# Patient Record
Sex: Female | Born: 1945
Health system: Southern US, Community
[De-identification: ages and names within clinical notes are randomized; demographics above are authoritative.]

## PROBLEM LIST (undated history)

## (undated) DIAGNOSIS — I2510 Atherosclerotic heart disease of native coronary artery without angina pectoris: Secondary | ICD-10-CM

## (undated) DIAGNOSIS — I428 Other cardiomyopathies: Secondary | ICD-10-CM

## (undated) DIAGNOSIS — E119 Type 2 diabetes mellitus without complications: Secondary | ICD-10-CM

## (undated) DIAGNOSIS — D649 Anemia, unspecified: Secondary | ICD-10-CM

## (undated) DIAGNOSIS — F329 Major depressive disorder, single episode, unspecified: Secondary | ICD-10-CM

## (undated) DIAGNOSIS — F419 Anxiety disorder, unspecified: Secondary | ICD-10-CM

## (undated) DIAGNOSIS — I10 Essential (primary) hypertension: Secondary | ICD-10-CM

## (undated) DIAGNOSIS — I5022 Chronic systolic (congestive) heart failure: Secondary | ICD-10-CM

## (undated) DIAGNOSIS — F32A Depression, unspecified: Secondary | ICD-10-CM

## (undated) DIAGNOSIS — R7881 Bacteremia: Secondary | ICD-10-CM

## (undated) DIAGNOSIS — I739 Peripheral vascular disease, unspecified: Secondary | ICD-10-CM

## (undated) HISTORY — DX: Major depressive disorder, single episode, unspecified: F32.9

## (undated) HISTORY — PX: TOE AMPUTATION: SHX809

## (undated) HISTORY — PX: ABDOMINAL HYSTERECTOMY: SHX81

## (undated) HISTORY — DX: Atherosclerotic heart disease of native coronary artery without angina pectoris: I25.10

## (undated) HISTORY — DX: Bacteremia: R78.81

## (undated) HISTORY — DX: Essential (primary) hypertension: I10

## (undated) HISTORY — PX: CARDIAC DEFIBRILLATOR PLACEMENT: SHX171

## (undated) HISTORY — DX: Other cardiomyopathies: I42.8

## (undated) HISTORY — DX: Peripheral vascular disease, unspecified: I73.9

## (undated) HISTORY — DX: Anxiety disorder, unspecified: F41.9

## (undated) HISTORY — DX: Chronic systolic (congestive) heart failure: I50.22

## (undated) HISTORY — DX: Type 2 diabetes mellitus without complications: E11.9

## (undated) HISTORY — PX: BLADDER SURGERY: SHX569

## (undated) HISTORY — PX: EYE SURGERY: SHX253

## (undated) HISTORY — PX: BREAST LUMPECTOMY: SHX2

## (undated) HISTORY — DX: Depression, unspecified: F32.A

## (undated) HISTORY — PX: OTHER SURGICAL HISTORY: SHX169

---

## 2002-04-23 ENCOUNTER — Emergency Department (HOSPITAL_COMMUNITY): Admission: EM | Admit: 2002-04-23 | Discharge: 2002-04-23 | Payer: Self-pay | Admitting: Emergency Medicine

## 2003-07-25 ENCOUNTER — Encounter: Payer: Self-pay | Admitting: General Surgery

## 2003-07-25 ENCOUNTER — Ambulatory Visit (HOSPITAL_COMMUNITY): Admission: RE | Admit: 2003-07-25 | Discharge: 2003-07-25 | Payer: Self-pay | Admitting: General Surgery

## 2003-09-03 ENCOUNTER — Ambulatory Visit (HOSPITAL_COMMUNITY): Admission: RE | Admit: 2003-09-03 | Discharge: 2003-09-03 | Payer: Self-pay | Admitting: General Surgery

## 2004-12-08 ENCOUNTER — Emergency Department (HOSPITAL_COMMUNITY): Admission: EM | Admit: 2004-12-08 | Discharge: 2004-12-08 | Payer: Self-pay | Admitting: Emergency Medicine

## 2006-01-20 ENCOUNTER — Ambulatory Visit: Payer: Self-pay | Admitting: Orthopedic Surgery

## 2006-12-04 ENCOUNTER — Emergency Department (HOSPITAL_COMMUNITY): Admission: EM | Admit: 2006-12-04 | Discharge: 2006-12-05 | Payer: Self-pay | Admitting: Emergency Medicine

## 2009-11-08 ENCOUNTER — Emergency Department (HOSPITAL_COMMUNITY): Admission: EM | Admit: 2009-11-08 | Discharge: 2009-11-08 | Payer: Self-pay | Admitting: Emergency Medicine

## 2009-11-12 ENCOUNTER — Ambulatory Visit: Payer: Self-pay | Admitting: Cardiology

## 2009-11-12 ENCOUNTER — Ambulatory Visit (HOSPITAL_COMMUNITY): Admission: RE | Admit: 2009-11-12 | Discharge: 2009-11-12 | Payer: Self-pay | Admitting: Family Medicine

## 2009-11-12 ENCOUNTER — Encounter (INDEPENDENT_AMBULATORY_CARE_PROVIDER_SITE_OTHER): Payer: Self-pay | Admitting: Family Medicine

## 2009-11-15 ENCOUNTER — Encounter (INDEPENDENT_AMBULATORY_CARE_PROVIDER_SITE_OTHER): Payer: Self-pay | Admitting: *Deleted

## 2009-11-15 LAB — CONVERTED CEMR LAB
Cholesterol: 163 mg/dL
Creatinine, Ser: 0.78 mg/dL
HDL: 66 mg/dL
Potassium: 4.3 meq/L
Triglycerides: 34 mg/dL

## 2009-11-21 ENCOUNTER — Encounter (INDEPENDENT_AMBULATORY_CARE_PROVIDER_SITE_OTHER): Payer: Self-pay | Admitting: *Deleted

## 2009-11-21 LAB — CONVERTED CEMR LAB
Brain Natriuretic Peptide: 198.7
CO2: 28 meq/L
Glucose, Bld: 313 mg/dL
Potassium: 4.4 meq/L

## 2009-12-04 ENCOUNTER — Encounter (INDEPENDENT_AMBULATORY_CARE_PROVIDER_SITE_OTHER): Payer: Self-pay | Admitting: *Deleted

## 2009-12-05 ENCOUNTER — Encounter (INDEPENDENT_AMBULATORY_CARE_PROVIDER_SITE_OTHER): Payer: Self-pay | Admitting: *Deleted

## 2009-12-05 ENCOUNTER — Ambulatory Visit: Payer: Self-pay | Admitting: Cardiology

## 2009-12-05 ENCOUNTER — Encounter: Payer: Self-pay | Admitting: Cardiovascular Disease

## 2009-12-05 DIAGNOSIS — I10 Essential (primary) hypertension: Secondary | ICD-10-CM | POA: Insufficient documentation

## 2009-12-05 DIAGNOSIS — I447 Left bundle-branch block, unspecified: Secondary | ICD-10-CM | POA: Insufficient documentation

## 2009-12-05 LAB — CONVERTED CEMR LAB
CO2: 27 meq/L
HCT: 36.1 %
Hemoglobin: 11.8 g/dL
INR: 1.05
Sodium: 139 meq/L

## 2009-12-09 ENCOUNTER — Encounter: Payer: Self-pay | Admitting: Cardiology

## 2009-12-10 ENCOUNTER — Ambulatory Visit: Payer: Self-pay | Admitting: Cardiology

## 2009-12-10 ENCOUNTER — Inpatient Hospital Stay (HOSPITAL_BASED_OUTPATIENT_CLINIC_OR_DEPARTMENT_OTHER): Admission: RE | Admit: 2009-12-10 | Discharge: 2009-12-10 | Payer: Self-pay | Admitting: Cardiovascular Disease

## 2009-12-13 ENCOUNTER — Ambulatory Visit (HOSPITAL_COMMUNITY): Admission: RE | Admit: 2009-12-13 | Discharge: 2009-12-13 | Payer: Self-pay | Admitting: Urology

## 2009-12-25 ENCOUNTER — Encounter (INDEPENDENT_AMBULATORY_CARE_PROVIDER_SITE_OTHER): Payer: Self-pay | Admitting: *Deleted

## 2009-12-31 ENCOUNTER — Ambulatory Visit: Payer: Self-pay | Admitting: Cardiology

## 2009-12-31 DIAGNOSIS — I5022 Chronic systolic (congestive) heart failure: Secondary | ICD-10-CM | POA: Insufficient documentation

## 2009-12-31 DIAGNOSIS — E785 Hyperlipidemia, unspecified: Secondary | ICD-10-CM | POA: Insufficient documentation

## 2009-12-31 DIAGNOSIS — I2510 Atherosclerotic heart disease of native coronary artery without angina pectoris: Secondary | ICD-10-CM | POA: Insufficient documentation

## 2009-12-31 DIAGNOSIS — I428 Other cardiomyopathies: Secondary | ICD-10-CM | POA: Insufficient documentation

## 2010-01-01 ENCOUNTER — Telehealth (INDEPENDENT_AMBULATORY_CARE_PROVIDER_SITE_OTHER): Payer: Self-pay

## 2010-01-15 ENCOUNTER — Encounter (INDEPENDENT_AMBULATORY_CARE_PROVIDER_SITE_OTHER): Payer: Self-pay | Admitting: *Deleted

## 2010-01-17 ENCOUNTER — Encounter: Payer: Self-pay | Admitting: Cardiology

## 2010-01-20 ENCOUNTER — Encounter: Payer: Self-pay | Admitting: Cardiology

## 2010-01-20 LAB — CONVERTED CEMR LAB
ALT: 16 units/L (ref 0–35)
Alkaline Phosphatase: 70 units/L (ref 39–117)
Calcium: 8.5 mg/dL (ref 8.4–10.5)
Cholesterol: 185 mg/dL (ref 0–200)
Glucose, Bld: 231 mg/dL — ABNORMAL HIGH (ref 70–99)
HDL: 67 mg/dL (ref >39)
LDL Cholesterol: 108 mg/dL — ABNORMAL HIGH (ref 0–99)
Potassium: 3.7 meq/L (ref 3.5–5.3)
Sodium: 142 meq/L (ref 135–145)
Total Bilirubin: 0.3 mg/dL (ref 0.3–1.2)
Triglycerides: 51 mg/dL (ref <150)

## 2010-01-21 ENCOUNTER — Ambulatory Visit: Payer: Self-pay | Admitting: Cardiology

## 2010-01-22 ENCOUNTER — Ambulatory Visit (HOSPITAL_COMMUNITY): Admission: RE | Admit: 2010-01-22 | Discharge: 2010-01-22 | Payer: Self-pay | Admitting: Cardiology

## 2010-01-22 ENCOUNTER — Encounter: Payer: Self-pay | Admitting: Cardiology

## 2010-01-22 ENCOUNTER — Telehealth (INDEPENDENT_AMBULATORY_CARE_PROVIDER_SITE_OTHER): Payer: Self-pay | Admitting: *Deleted

## 2010-01-22 ENCOUNTER — Ambulatory Visit: Payer: Self-pay | Admitting: Cardiology

## 2010-02-10 ENCOUNTER — Encounter: Admission: RE | Admit: 2010-02-10 | Discharge: 2010-02-10 | Payer: Self-pay | Admitting: Otolaryngology

## 2010-03-18 ENCOUNTER — Ambulatory Visit: Payer: Self-pay | Admitting: Cardiology

## 2010-04-03 ENCOUNTER — Encounter (INDEPENDENT_AMBULATORY_CARE_PROVIDER_SITE_OTHER): Payer: Self-pay | Admitting: *Deleted

## 2010-04-03 LAB — CONVERTED CEMR LAB
Glucose, Bld: 105 mg/dL
Potassium: 4.3 meq/L
Sodium: 145 meq/L

## 2010-04-04 ENCOUNTER — Encounter: Payer: Self-pay | Admitting: Cardiology

## 2010-04-04 LAB — CONVERTED CEMR LAB
BUN: 24 mg/dL — ABNORMAL HIGH (ref 6–23)
Calcium: 9 mg/dL (ref 8.4–10.5)
Creatinine, Ser: 0.66 mg/dL (ref 0.40–1.20)
Sodium: 145 meq/L (ref 135–145)

## 2010-04-08 ENCOUNTER — Telehealth (INDEPENDENT_AMBULATORY_CARE_PROVIDER_SITE_OTHER): Payer: Self-pay | Admitting: *Deleted

## 2010-04-30 ENCOUNTER — Encounter (INDEPENDENT_AMBULATORY_CARE_PROVIDER_SITE_OTHER): Payer: Self-pay | Admitting: *Deleted

## 2010-05-01 ENCOUNTER — Ambulatory Visit: Payer: Self-pay | Admitting: Cardiology

## 2010-05-19 ENCOUNTER — Encounter (INDEPENDENT_AMBULATORY_CARE_PROVIDER_SITE_OTHER): Payer: Self-pay | Admitting: *Deleted

## 2010-05-19 LAB — CONVERTED CEMR LAB
Brain Natriuretic Peptide: 298.2
Calcium: 9.2 mg/dL
Glucose, Bld: 200 mg/dL

## 2010-05-20 ENCOUNTER — Encounter (INDEPENDENT_AMBULATORY_CARE_PROVIDER_SITE_OTHER): Payer: Self-pay | Admitting: *Deleted

## 2010-05-25 LAB — CONVERTED CEMR LAB
BUN: 28 mg/dL — ABNORMAL HIGH (ref 6–23)
CO2: 26 meq/L (ref 19–32)
Calcium: 9.2 mg/dL (ref 8.4–10.5)
Creatinine, Ser: 0.71 mg/dL (ref 0.40–1.20)
Glucose, Bld: 200 mg/dL — ABNORMAL HIGH (ref 70–99)
Sodium: 143 meq/L (ref 135–145)

## 2010-05-26 ENCOUNTER — Encounter: Payer: Self-pay | Admitting: Cardiology

## 2010-05-28 ENCOUNTER — Ambulatory Visit: Payer: Self-pay | Admitting: Cardiology

## 2010-06-12 ENCOUNTER — Telehealth (INDEPENDENT_AMBULATORY_CARE_PROVIDER_SITE_OTHER): Payer: Self-pay

## 2010-06-25 ENCOUNTER — Encounter: Payer: Self-pay | Admitting: Cardiology

## 2010-06-25 LAB — CONVERTED CEMR LAB
BUN: 27 mg/dL — ABNORMAL HIGH (ref 6–23)
CO2: 27 meq/L (ref 19–32)
Chloride: 107 meq/L (ref 96–112)
Glucose, Bld: 145 mg/dL — ABNORMAL HIGH (ref 70–99)
Potassium: 4.2 meq/L (ref 3.5–5.3)
Sodium: 141 meq/L (ref 135–145)

## 2010-07-01 ENCOUNTER — Ambulatory Visit: Payer: Self-pay | Admitting: Cardiology

## 2010-07-08 ENCOUNTER — Telehealth: Payer: Self-pay | Admitting: Internal Medicine

## 2010-07-28 ENCOUNTER — Ambulatory Visit (HOSPITAL_COMMUNITY): Admission: RE | Admit: 2010-07-28 | Discharge: 2010-07-28 | Payer: Self-pay | Admitting: Internal Medicine

## 2010-07-28 ENCOUNTER — Ambulatory Visit: Payer: Self-pay | Admitting: Internal Medicine

## 2010-08-12 ENCOUNTER — Ambulatory Visit: Payer: Self-pay | Admitting: Internal Medicine

## 2010-08-12 LAB — CONVERTED CEMR LAB
BUN: 24 mg/dL — ABNORMAL HIGH (ref 6–23)
Calcium: 9.4 mg/dL (ref 8.4–10.5)
Glucose, Bld: 137 mg/dL — ABNORMAL HIGH (ref 70–99)
INR: 1 (ref 0.8–1.0)
Lymphocytes Relative: 30.8 % (ref 12.0–46.0)
MCHC: 34 g/dL (ref 30.0–36.0)
Monocytes Absolute: 0.6 10*3/uL (ref 0.1–1.0)
Neutro Abs: 4.4 10*3/uL (ref 1.4–7.7)
Neutrophils Relative %: 59.7 % (ref 43.0–77.0)
Platelets: 191 10*3/uL (ref 150.0–400.0)
Potassium: 4.3 meq/L (ref 3.5–5.1)
RBC: 3.8 M/uL — ABNORMAL LOW (ref 3.87–5.11)
WBC: 7.3 10*3/uL (ref 4.5–10.5)

## 2010-08-14 ENCOUNTER — Ambulatory Visit: Payer: Self-pay | Admitting: Internal Medicine

## 2010-08-14 ENCOUNTER — Inpatient Hospital Stay (HOSPITAL_BASED_OUTPATIENT_CLINIC_OR_DEPARTMENT_OTHER): Admission: RE | Admit: 2010-08-14 | Discharge: 2010-08-14 | Payer: Self-pay | Admitting: Internal Medicine

## 2010-08-19 ENCOUNTER — Ambulatory Visit: Payer: Self-pay | Admitting: Cardiology

## 2010-08-19 ENCOUNTER — Ambulatory Visit (HOSPITAL_COMMUNITY): Admission: RE | Admit: 2010-08-19 | Discharge: 2010-08-19 | Payer: Self-pay | Admitting: Internal Medicine

## 2010-08-27 ENCOUNTER — Ambulatory Visit: Payer: Self-pay | Admitting: Internal Medicine

## 2010-09-01 ENCOUNTER — Ambulatory Visit: Payer: Self-pay | Admitting: Cardiology

## 2010-09-03 ENCOUNTER — Encounter: Payer: Self-pay | Admitting: Internal Medicine

## 2010-09-03 ENCOUNTER — Encounter (INDEPENDENT_AMBULATORY_CARE_PROVIDER_SITE_OTHER): Payer: Self-pay | Admitting: *Deleted

## 2010-09-18 ENCOUNTER — Telehealth: Payer: Self-pay | Admitting: Internal Medicine

## 2010-09-24 ENCOUNTER — Ambulatory Visit: Payer: Self-pay | Admitting: Internal Medicine

## 2010-09-24 LAB — CONVERTED CEMR LAB
BUN: 21 mg/dL (ref 6–23)
Basophils Absolute: 0 10*3/uL (ref 0.0–0.1)
Basophils Relative: 0.4 % (ref 0.0–3.0)
CO2: 29 meq/L (ref 19–32)
Chloride: 107 meq/L (ref 96–112)
Eosinophils Absolute: 0.1 10*3/uL (ref 0.0–0.7)
GFR calc non Af Amer: 127.01 mL/min (ref >60.00)
Glucose, Bld: 110 mg/dL — ABNORMAL HIGH (ref 70–99)
HCT: 33.3 % — ABNORMAL LOW (ref 36.0–46.0)
Hemoglobin: 11.5 g/dL — ABNORMAL LOW (ref 12.0–15.0)
INR: 1.1 — ABNORMAL HIGH (ref 0.8–1.0)
Lymphs Abs: 2.1 10*3/uL (ref 0.7–4.0)
MCHC: 34.4 g/dL (ref 30.0–36.0)
MCV: 91.4 fL (ref 78.0–100.0)
Monocytes Relative: 9 % (ref 3.0–12.0)
Neutro Abs: 3.3 10*3/uL (ref 1.4–7.7)
Neutrophils Relative %: 53.7 % (ref 43.0–77.0)
Platelets: 186 10*3/uL (ref 150.0–400.0)
Potassium: 4.3 meq/L (ref 3.5–5.1)
Prothrombin Time: 11.7 s (ref 9.7–11.8)
RDW: 14.6 % (ref 11.5–14.6)

## 2010-10-01 ENCOUNTER — Ambulatory Visit (HOSPITAL_COMMUNITY)
Admission: RE | Admit: 2010-10-01 | Discharge: 2010-10-02 | Payer: Self-pay | Source: Home / Self Care | Attending: Internal Medicine | Admitting: Internal Medicine

## 2010-10-02 ENCOUNTER — Encounter: Payer: Self-pay | Admitting: Internal Medicine

## 2010-10-17 ENCOUNTER — Encounter: Payer: Self-pay | Admitting: Internal Medicine

## 2010-10-17 ENCOUNTER — Ambulatory Visit
Admission: RE | Admit: 2010-10-17 | Discharge: 2010-10-17 | Payer: Self-pay | Source: Home / Self Care | Attending: Cardiology | Admitting: Cardiology

## 2010-11-02 LAB — CONVERTED CEMR LAB
Calcium: 9.4 mg/dL (ref 8.4–10.5)
Glucose, Bld: 109 mg/dL — ABNORMAL HIGH (ref 70–99)
INR: 1.05 (ref <1.50)
Lymphs Abs: 2.8 10*3/uL (ref 0.7–4.0)
MCHC: 32.7 g/dL (ref 30.0–36.0)
MCV: 88.7 fL (ref 78.0–100.0)
Monocytes Absolute: 0.7 10*3/uL (ref 0.1–1.0)
Neutro Abs: 3.7 10*3/uL (ref 1.7–7.7)
Neutrophils Relative %: 50 % (ref 43–77)
Platelets: 217 10*3/uL (ref 150–400)
Prothrombin Time: 13.6 s (ref 11.6–15.2)
RDW: 13.5 % (ref 11.5–15.5)
aPTT: 26 s (ref 24–37)

## 2010-11-04 NOTE — Miscellaneous (Signed)
Summary: LABS BMP,04/03/2010  Clinical Lists Changes  Observations: Added new observation of CALCIUM: 9.0 mg/dL (04/03/2010 16:15) Added new observation of CREATININE: 0.66 mg/dL (04/03/2010 16:15) Added new observation of BUN: 24 mg/dL (04/03/2010 16:15) Added new observation of BG RANDOM: 105 mg/dL (04/03/2010 16:15) Added new observation of CO2 PLSM/SER: 25 meq/L (04/03/2010 16:15) Added new observation of CL SERUM: 108 meq/L (04/03/2010 16:15) Added new observation of K SERUM: 4.3 meq/L (04/03/2010 16:15) Added new observation of NA: 145 meq/L (04/03/2010 16:15)

## 2010-11-04 NOTE — Assessment & Plan Note (Signed)
Summary: ROBV   Visit Type:  Follow-up Primary Provider:  Dr. Marjean Donna   History of Present Illness: 65 year old woman presents for followup. She is here with her daughter. She had a followup echocardiogram done back in April, detailed below, showing no significant improvement in LVEF as yet. She reports compliance with her medications.  From a symptom perspective, [name] states that she still has regular episodic dizziness (seems orthostatic in description), although no palpitations or syncope. She also reports occasional chest pains and NYHA class 2-3 dyspnea on exertion. Denies any falls. She has not returned to work, and  is undergoing disability determination based on notification received by this office.  She states that she feels unsteady on her feet, easily fatigued, and does not feel like she is able to return to work at this point.  I discussed with the patient and her daughter today, the rationale for ongoing medical therapy including continued titration of therapy. I also discussed the possibility of referral to our heart failure clinic, potentially even to consider more objective evaluation (CPX testing) if she remains symptomatic, despite appropriate medication adjustments over time.  If LVEF does not show any improvement, she may also be candidate for device therapy.  Current Medications (verified): 1)  Aspir-Low 81 Mg Tbec (Aspirin) .... Take 1 Tab Daily 2)  Losartan Potassium 50 Mg Tabs (Losartan Potassium) .... Take 1/2 (25mg )  Tablet By Mouth in The Am and 1(50mg ) Tablet At Bedtime 3)  Furosemide 20 Mg Tabs (Furosemide) .... Take 1 Tablet By Mouth Once Daily 4)  Klor-Con 10 10 Meq Cr-Tabs (Potassium Chloride) .... Take 1 Tablet By Mouth Once Daily 5)  Carvedilol 6.25 Mg Tabs (Carvedilol) .... Take 1 Tablet By Mouth Two Times A Day 6)  Nitrostat 0.4 Mg Subl (Nitroglycerin) .... Take As Directed For Chestpain 7)  Lantus 100 Unit/ml Soln (Insulin Glargine) .... 40  Units Daily 8)  Flexeril 10 Mg Tabs (Cyclobenzaprine Hcl) .... Take As Needed 9)  Digoxin 0.125 Mg Tabs (Digoxin) .... Take 1 Tablet By Mouth Once Daily  Allergies (verified): 1)  ! Vicodin  Past History:  Social History: Last updated: 03/18/2010 Full Time - [employer] [facility] (out of work) Tobacco Use - No Alcohol Use - no 2 children  Past Medical History: Diabetes Type 2 Hypertension Nonischemic cardiomyopathy, LVEF 20%  Social History: Full Time - [facility] (out of work) Tobacco Use - No Alcohol Use - no 2 children  Review of Systems       The patient complains of chest pain and dyspnea on exertion.  The patient denies anorexia, fever, weight gain, syncope, peripheral edema, prolonged cough, headaches, melena, hematochezia, and severe indigestion/heartburn.         Otherwise reviewed and negative.  Clinical Review Panels:  Cardiac Imaging Cardiac Cath Findings  CONCLUSION:   1. Nonobstructive coronary artery disease with 40% narrowing of the       proximal LAD, no significant obstruction of the circumflex and       right coronary artery.   2. Severe left ventricular dysfunction with global hypokinesis and an       estimated ejection fraction of 20-25% with pulmonary wedge pressure       of 15.      RECOMMENDATIONS:  The patient has severe dilated cardiomyopathy.  We   will plan to followup with Dr. [name] and continue medical management.               Bruce Alfonso Patten  Olevia Perches, MD, [facility]     (12/10/2009)    Vital Signs:  Patient profile:   65 year old female Weight:      134 pounds Pulse rate:   73 / minute Pulse (ortho):   71 / minute BP sitting:   138 / 70  (right arm) BP standing:   104 / 63  Vitals Entered By: Doretha Sou, CNA (March 18, 2010 9:31 AM)  Serial Vital Signs/Assessments:  Time      Position  BP       Pulse  Resp  Temp     By 9:53 AM   Lying LA  148/85   74                    Lynn Via LPN [id number] AM   Sitting   131/75   72                     Lynn Via LPN [id number] AM   Standing  104/63   71                    Lynn Via LPN  Comments: [id number] AM No s/s By: Jeani Hawking Via LPN    Physical Exam  Additional Exam:  Normally nourished appearing woman in no acute distress. HEENT: Conjunctiva and lids normal, oropharynx clear. Neck: Supple, JVP 8 cm water, no carotid bruits, no thyromegaly. Lungs: Clear with diminished breath sounds, nonlabored. Cardiac: Indistinct PMI, regular rate and rhythm, paradoxically split S2, no S3. Abdomen: Soft, nontender, no hepatomegaly, bowel sounds present. Extremities: Trace ankle edema, distal pulses one plus. Skin: Warm and dry. Musculoskeletal: No kyphosis. Neuropsychiatric: Alert and oriented x3, affect appropriate.    Echocardiogram  Procedure date:  01/22/2010  Findings:      Study Conclusions    - Left ventricle: The cavity size was mildly dilated. Wall thickness     was increased in a pattern of moderate LVH. Systolic function was     severely reduced. The estimated ejection fraction was in the range     of 15% to 20%. Doppler parameters are consistent with abnormal     left ventricular relaxation (grade 1 diastolic dysfunction).   - Ventricular septum: Septal motion showed abnormal function and     dyssynergy.   - Aortic valve: Mildly calcified annulus. Trileaflet; mildly     calcified leaflets. Trivial regurgitation. Peak gradient: 79mm Hg     (S).   - Mitral valve: Mild regurgitation.   - Left atrium: The atrium was mildly dilated.   - Right ventricle: Systolic function was normal.   - Tricuspid valve: Mild regurgitation.   - Pulmonary arteries: PA peak pressure: 53mm Hg (S).   - Pericardium, extracardiac: A small pericardial effusion was     identified.  Impression & Recommendations:  Problem # 1:  CHRONIC SYSTOLIC HEART FAILURE (123456)  Patient appears euvolemic, although continues to be symptomatic as noted above. She is somewhat orthostatic on examination  today, although not frankly hypotensive. We discussed a variety of options, and at this point we will attempt further medication adjustments, including decreasing Lasix to 20 mg a day, advancement in afterload reduction with an increase in Losartan to 25 mg p.o. q.a.m. and 50 mg p.o. q.p.m., and the addition of digoxin at 0.125 mg p.o. q.d. She will need a followup BMET and BNP in 2 weeks, I will see her back in 4 weeks.  Her updated medication list for  this problem includes:    Aspir-low 81 Mg Tbec (Aspirin) .Marland Kitchen... Take 1 tab daily    Losartan Potassium 50 Mg Tabs (Losartan potassium) .Marland Kitchen... Take 1/2 (25mg )  tablet by mouth in the am and 1(50mg ) tablet at bedtime    Furosemide 20 Mg Tabs (Furosemide) .Marland Kitchen... Take 1 tablet by mouth once daily    Carvedilol 6.25 Mg Tabs (Carvedilol) .Marland Kitchen... Take 1 tablet by mouth two times a day    Nitrostat 0.4 Mg Subl (Nitroglycerin) .Marland Kitchen... Take as directed for chestpain    Digoxin 0.125 Mg Tabs (Digoxin) .Marland Kitchen... Take 1 tablet by mouth once daily  Future Orders: T-Basic Metabolic Panel ([id number]) ... 04/01/2010  Problem # 2:  CORONARY ATHEROSCLEROSIS NATIVE CORONARY ARTERY (ICD-414.01)  Nonobstructive, documented at cardiac catheterization.  Her updated medication list for this problem includes:    Aspir-low 81 Mg Tbec (Aspirin) .Marland Kitchen... Take 1 tab daily    Carvedilol 6.25 Mg Tabs (Carvedilol) .Marland Kitchen... Take 1 tablet by mouth two times a day    Nitrostat 0.4 Mg Subl (Nitroglycerin) .Marland Kitchen... Take as directed for chestpain  Problem # 3:  LEFT BUNDLE BRANCH BLOCK (ICD-426.3)  Chronic. May ultimately be a candidate for resynchronization therapy if LVEF does not improve.  Her updated medication list for this problem includes:    Aspir-low 81 Mg Tbec (Aspirin) .Marland Kitchen... Take 1 tab daily    Carvedilol 6.25 Mg Tabs (Carvedilol) .Marland Kitchen... Take 1 tablet by mouth two times a day    Nitrostat 0.4 Mg Subl (Nitroglycerin) .Marland Kitchen... Take as directed for chestpain  Problem # 4:  UNSPECIFIED  SECONDARY CARDIOMYOPATHY (ICD-425.9)  Nonischemic cardiomyopathy.  Her updated medication list for this problem includes:    Aspir-low 81 Mg Tbec (Aspirin) .Marland Kitchen... Take 1 tab daily    Losartan Potassium 50 Mg Tabs (Losartan potassium) .Marland Kitchen... Take 1/2 (25mg )  tablet by mouth in the am and 1(50mg ) tablet at bedtime    Furosemide 20 Mg Tabs (Furosemide) .Marland Kitchen... Take 1 tablet by mouth once daily    Carvedilol 6.25 Mg Tabs (Carvedilol) .Marland Kitchen... Take 1 tablet by mouth two times a day    Nitrostat 0.4 Mg Subl (Nitroglycerin) .Marland Kitchen... Take as directed for chestpain    Digoxin 0.125 Mg Tabs (Digoxin) .Marland Kitchen... Take 1 tablet by mouth once daily  Patient Instructions: 1)  Your physician recommends that you schedule a follow-up appointment in: 4 weeks 2)  Your physician recommends that you return for lab work in: 2 weeks 3)  Your physician has recommended you make the following change in your medication: Start taking Digoxin 0.125mg  by mouth once daily, decrease Lasix to 20mg  by mouth once daily and increase Losartan to 1/2 tablet (25mg ) in the mornings and 1 tablet (50mg ) by mouth at bedtime  Prescriptions: LOSARTAN POTASSIUM 50 MG TABS (LOSARTAN POTASSIUM) TAKE 1/2 (25mg )  tablet by mouth in the AM and 1(50mg ) tablet at bedtime  #45 x 3   Entered by:   Jeani Hawking Via LPN   Authorized by:   Beckie Salts, MD, [facility]   Signed by:   Jeani Hawking Via LPN on [id number]   Method used:   Electronically to        [facility] (retail)       [address] [address]       [place], [place]  [address]       Ph: [phone]       Fax: [phone]   RxID:   [id number] DIGOXIN 0.125 MG  TABS (DIGOXIN) take 1 tablet by mouth once daily  #30 x 3   Entered by:   Jeani Hawking Via LPN   Authorized by:   Beckie Salts, MD, [facility]   Signed by:   Jeani Hawking Via LPN on [id number]   Method used:   Electronically to        [facility] (retail)       [place] [address]       [place], [place]  [address]       Ph: [phone]       Fax: [phone]   RxID:   [id number]

## 2010-11-04 NOTE — Assessment & Plan Note (Signed)
Summary: 1 mth f/u per checkout on 05/28/10/tg   Visit Type:  Follow-up Primary Provider:  Dr. Marjean Donna   History of Present Illness: 65 year old woman presents for followup. I saw her back in August. She is here with her daughter for followup. She continues to report fatigue, intermittent chest pain, and orthostasis. NYHA class 2-3 dyspnea exertion by report.  Followup labs from 20 September showed sodium 141, potassium 4.2, BUN 27, creatinine 0.7, BNP 106. Medical therapy is fairly well rounded out, reviewed below. We have been limited somewhat in further titration basilar blood pressure.  At the last visit I discussed with her the possibility of proceeding with a cardiopulmonary stress test for more objective assessment of her functional limitation, since she continued to complain of significant fatigue. I also again discussed with her potential for device therapy, specifically a defibrillator and perhaps resynchronization therapy. She has been hesitant to consider these options, although now is more amenable. Frankly she has concerns about her ability to work, and I think that further objective testing may be of use in planning additional therapies.  Clinical Review Panels:  Echocardiogram Echocardiogram Study Conclusions    - Left ventricle: The cavity size was mildly dilated. Wall thickness     was increased in a pattern of moderate LVH. Systolic function was     severely reduced. The estimated ejection fraction was in the range     of 15% to 20%. Doppler parameters are consistent with abnormal     left ventricular relaxation (grade 1 diastolic dysfunction).   - Ventricular septum: Septal motion showed abnormal function and     dyssynergy.   - Aortic valve: Mildly calcified annulus. Trileaflet; mildly     calcified leaflets. Trivial regurgitation. Peak gradient: 52mm Hg     (S).   - Mitral valve: Mild regurgitation.   - Left atrium: The atrium was mildly dilated.   - Right  ventricle: Systolic function was normal.   - Tricuspid valve: Mild regurgitation.   - Pulmonary arteries: PA peak pressure: 47mm Hg (S).   - Pericardium, extracardiac: A small pericardial effusion was     identified. (01/22/2010)  Cardiac Imaging Cardiac Cath Findings  CONCLUSION:   1. Nonobstructive coronary artery disease with 40% narrowing of the       proximal LAD, no significant obstruction of the circumflex and       right coronary artery.   2. Severe left ventricular dysfunction with global hypokinesis and an       estimated ejection fraction of 20-25% with pulmonary wedge pressure       of 15.      RECOMMENDATIONS:  The patient has severe dilated cardiomyopathy.  We   will plan to followup with Dr. [name] and continue medical management.               Bruce Alfonso Patten Olevia Perches, MD, [facility]     (12/10/2009)    Current Medications (verified): 1)  Aspir-Low 81 Mg Tbec (Aspirin) .... Take 1 Tab Daily 2)  Losartan Potassium 25 Mg Tabs (Losartan Potassium) .... Take 1 in Am and 2 in Pm 3)  Furosemide 20 Mg Tabs (Furosemide) .... Take 1 Tablet By Mouth Prn  For Swelling 4)  Klor-Con 10 10 Meq Cr-Tabs (Potassium Chloride) .... Take 1 Tablet By Mouth As Needed (Take When You Take Furosemide) 5)  Carvedilol 6.25 Mg Tabs (Carvedilol) .... Take 1 Tablet By Mouth Two Times A Day 6)  Nitrostat 0.4 Mg Subl (Nitroglycerin) .... Take  As Directed For Chestpain 7)  Lantus 100 Unit/ml Soln (Insulin Glargine) .... 40 Units Daily 8)  Digoxin 0.125 Mg Tabs (Digoxin) .... Take 1 Tablet By Mouth Once Daily 9)  Colace 100 Mg Caps (Docusate Sodium) .... Take 1 Tablet Daily As Needed For Constipation 10)  Aldactone 25 Mg Tabs (Spironolactone) .... Take 1 Tablet By Mouth By Mouth Once Daily 11)  Vitamin D3 1000000 Unit/gm Liqd (Cholecalciferol) .... Take 1 Tab Daily  Allergies (verified): 1)  ! Vicodin  Past History:  Past Medical History: Last updated: 05/01/2010 Diabetes Type  2 Hypertension Nonischemic cardiomyopathy, LVEF 20% CAD - nonobstructive  Past Surgical History: Last updated: January 01, 2010 Hysterectomy Left shoulder surgery Lumpectomy  Family History: Last updated: 2010/01/01 Father: died in his 75s with cirrhosis Mother: died age 69 with ovarian cancer  Social History: Last updated: 03/18/2010 Full Time - [employer] [facility] (out of work) Tobacco Use - No Alcohol Use - no 2 children  Review of Systems       The patient complains of chest pain and dyspnea on exertion.  The patient denies anorexia, weight gain, syncope, peripheral edema, prolonged cough, hemoptysis, melena, hematochezia, and severe indigestion/heartburn.         Otherwise reviewed and negative except as outlined.  Vital Signs:  Patient profile:   65 year old female Weight:      130 pounds BMI:     24.65 Pulse rate:   63 / minute BP sitting:   130 / 70  (right arm)  Vitals Entered By: Doretha Sou, CNA (July 01, 2010 8:24 AM)  Physical Exam  Additional Exam:  Normally nourished appearing woman in no acute distress. HEENT: Conjunctiva and lids normal, oropharynx clear. Neck: Supple, JVP 8 cm water, no carotid bruits, no thyromegaly. Lungs: Clear with diminished breath sounds, nonlabored. Cardiac: Indistinct PMI, regular rate and rhythm, paradoxically split S2, no S3. Abdomen: Soft, nontender, no hepatomegaly, bowel sounds present. Extremities: Trace ankle edema, distal pulses one plus. Skin: Warm and dry. Musculoskeletal: No kyphosis. Neuropsychiatric: Alert and oriented x3, affect appropriate.    EKG  Procedure date:  07/01/2010  Findings:      Sinus rhythm at 65 beats per minute with leftward axis and left bundle branch block.  Impression & Recommendations:  Problem # 1:  CHRONIC SYSTOLIC HEART FAILURE (123456)  Patient reports fatigue, NYHA class 2-3 dyspnea on exertion, intermittent chest pain. She is on a reasonable medical regimen, with  stable renal function, essentially normal BNP, and no weight gain. Difficult to get an objective sense of her functional capacity, but she clearly reports limitation. I have discussed this with her on the last 2 visits. At this point she seems more amenable to further evaluation by a congestive heart failure specialist. I anticipate referral for cardiopulmonary stress testing, and evaluation by Dr. Haroldine Laws. She might even need further consideration for resynchronization therapy depending on objective testing. No medication changes made today.  Her updated medication list for this problem includes:    Aspir-low 81 Mg Tbec (Aspirin) .Marland Kitchen... Take 1 tab daily    Losartan Potassium 25 Mg Tabs (Losartan potassium) .Marland Kitchen... Take 1 in am and 2 in pm    Furosemide 20 Mg Tabs (Furosemide) .Marland Kitchen... Take 1 tablet by mouth prn  for swelling    Carvedilol 6.25 Mg Tabs (Carvedilol) .Marland Kitchen... Take 1 tablet by mouth two times a day    Nitrostat 0.4 Mg Subl (Nitroglycerin) .Marland Kitchen... Take as directed for chestpain    Digoxin 0.125 Mg  Tabs (Digoxin) .Marland Kitchen... Take 1 tablet by mouth once daily    Aldactone 25 Mg Tabs (Spironolactone) .Marland Kitchen... Take 1 tablet by mouth by mouth once daily  Problem # 2:  CORONARY ATHEROSCLEROSIS NATIVE CORONARY ARTERY (ICD-414.01)  Mild atherosclerosis documented at cardiac catheterization earlier in the year, reviewed above.  Her updated medication list for this problem includes:    Aspir-low 81 Mg Tbec (Aspirin) .Marland Kitchen... Take 1 tab daily    Carvedilol 6.25 Mg Tabs (Carvedilol) .Marland Kitchen... Take 1 tablet by mouth two times a day    Nitrostat 0.4 Mg Subl (Nitroglycerin) .Marland Kitchen... Take as directed for chestpain  Problem # 3:  LEFT BUNDLE BRANCH BLOCK (ICD-426.3)  Old.  Her updated medication list for this problem includes:    Aspir-low 81 Mg Tbec (Aspirin) .Marland Kitchen... Take 1 tab daily    Carvedilol 6.25 Mg Tabs (Carvedilol) .Marland Kitchen... Take 1 tablet by mouth two times a day    Nitrostat 0.4 Mg Subl (Nitroglycerin) .Marland Kitchen... Take as  directed for chestpain  Problem # 4:  ESSENTIAL HYPERTENSION, BENIGN (ICD-401.1)  Blood pressure has been reasonably well controlled, at times has limited further up titration of therapy. She has had relative orthostasis on higher doses of medications.  Her updated medication list for this problem includes:    Aspir-low 81 Mg Tbec (Aspirin) .Marland Kitchen... Take 1 tab daily    Losartan Potassium 25 Mg Tabs (Losartan potassium) .Marland Kitchen... Take 1 in am and 2 in pm    Furosemide 20 Mg Tabs (Furosemide) .Marland Kitchen... Take 1 tablet by mouth prn  for swelling    Carvedilol 6.25 Mg Tabs (Carvedilol) .Marland Kitchen... Take 1 tablet by mouth two times a day    Aldactone 25 Mg Tabs (Spironolactone) .Marland Kitchen... Take 1 tablet by mouth by mouth once daily  Problem # 5:  UNSPECIFIED SECONDARY CARDIOMYOPATHY (ICD-425.9)  Nonischemic cardiomyopathy based on evaluation today.  Her updated medication list for this problem includes:    Aspir-low 81 Mg Tbec (Aspirin) .Marland Kitchen... Take 1 tab daily    Losartan Potassium 25 Mg Tabs (Losartan potassium) .Marland Kitchen... Take 1 in am and 2 in pm    Furosemide 20 Mg Tabs (Furosemide) .Marland Kitchen... Take 1 tablet by mouth prn  for swelling    Carvedilol 6.25 Mg Tabs (Carvedilol) .Marland Kitchen... Take 1 tablet by mouth two times a day    Nitrostat 0.4 Mg Subl (Nitroglycerin) .Marland Kitchen... Take as directed for chestpain    Digoxin 0.125 Mg Tabs (Digoxin) .Marland Kitchen... Take 1 tablet by mouth once daily    Aldactone 25 Mg Tabs (Spironolactone) .Marland Kitchen... Take 1 tablet by mouth by mouth once daily  Patient Instructions: 1)  Your physician recommends that you schedule a follow-up appointment in: 2 months 2)  Your physician recommends that you continue on your current medications as directed. Please refer to the Current Medication list given to you today. 3)  You have been referred to Dr. Haroldine Laws. This office will contact you once we have made arrangements for your visit with him.

## 2010-11-04 NOTE — Progress Notes (Signed)
Summary: cpx  Phone Note Outgoing Call   Call placed by: Kevan Rosebush, RN,  July 08, 2010 4:15 PM Call placed to: Patient Summary of Call: Dr [name] and Dr Haroldine Laws have discussed pt, she needs a CPX and f/u w/Dr Bensimhon, CPX is sch for 10/24 at 1:30 have called pt to let her know and to sch appt w/Dr Bensimhon, Left message to call back   Follow-up for Phone Call        pt called today and wanted an AM appt. advised CPX are done at 11:30 or 1:30, will resch for an 11:30 appt. Kevan Rosebush, RN  July 09, 2010 6:42 PM   CPX is resch for 10/24 at 11:30, have called pt w/no answer x 7 rings Kevan Rosebush, RN  July 21, 2010 4:17 PM   Additional Follow-up for Phone Call Additional follow up Details #1::        pt aware of cpx on 10/24 instructions reviewed w/her, appt sch w/Dr Bensimhon for 11/8 at [facility], RN  July 23, 2010 11:06 AM      Appended Document: cpx    Clinical Lists Changes  Orders: Added new Referral order of CPX Test at [facility] (CPX Test) - Signed

## 2010-11-04 NOTE — Assessment & Plan Note (Signed)
Summary: 3 wk f/u per checkout on 12/31/09/tg   Visit Type:  Follow-up Primary Provider:  Dr. Marjean Donna   History of Present Illness: 65 year old woman presents for follow-up. She complains of being fatigued, having shortness of breath sometimes NYHA class III, although no chest pain or palpitations. She does have occasional orthostatic dizziness, but no syncope.  She states that she is to undergo surgery for removal of a "bladder tumor" under general anesthesia at [facility] on Thursday.  Labs from 15 April revealed potassium 3.7, BUN 26, creatinine 0.85, AST 17, ALT 16, cholesterol 185, triglycerides 51, HDL 67, LDL 108.  I reviewed her medications, and she reports compliance.  Ms. Pettry had some questions about disability today. We talked about her cardiomyopathy, and the potential that she may show improvement in LVEF on medical therapy with time.  Current Medications (verified): 1)  Aspir-Low 81 Mg Tbec (Aspirin) .... Take 1 Tab Daily 2)  Losartan Potassium 50 Mg Tabs (Losartan Potassium) .... Take 1 Tablet By Mouth At Bedtime 3)  Furosemide 40 Mg Tabs (Furosemide) .... Take 1 Tablet By Mouth Once Daily 4)  Klor-Con 10 10 Meq Cr-Tabs (Potassium Chloride) .... Take 1 Tablet By Mouth Once Daily 5)  Carvedilol 6.25 Mg Tabs (Carvedilol) .... Take 1 Tablet By Mouth Two Times A Day 6)  Meclizine Hcl 12.5 Mg Tabs (Meclizine Hcl) .... Take 1 Tab Qid   Hold 7)  Nitrostat 0.4 Mg Subl (Nitroglycerin) .... Take As Directed For Chestpain 8)  Lantus 100 Unit/ml Soln (Insulin Glargine) .... 40 Units Daily  Allergies (verified): 1)  ! Vicodin  Past History:  Past Medical History: Last updated: 12/31/2009 Diabetes Type 2 Hypertension Nonischemic cardiomyopathy, LVEF 20-25%  Social History: Last updated: 12/05/2009 Full Time - [facility] Tobacco Use - No Alcohol Use - no 2 children  Clinical Review Panels:  Echocardiogram Echocardiogram   Study Conclusions    - Left  ventricle: The cavity size was mildly dilated. Wall thickness     was increased increased in a pattern of mild to moderate LVH.     Systolic function was severely reduced. The estimated ejection     fraction was in the range of 20% to 25%. Virtual akinesis of the     anteroseptal myocardium; moderate hypokinesis of the inferolateral     base. Other segments are severely hypokinetic. There was mild     spontaneous echo contrast, indicative of stasis.   - Ventricular septum: Septal motion showed mild paradox.   - Mitral valve: Mild regurgitation.   - Left atrium: The atrium was mildly dilated.   - Right ventricle: The cavity size was normal. Wall thickness was     mildly increased.   - Pulmonary arteries: Systolic pressure was mildly increased.   - Pericardium, extracardiac: A small, free-flowing circumferential     pericardial effusion was most prominent anterior to the heart. The     fluid had no internal echoes.   Transthoracic echocardiography. M-mode, complete 2D, spectral   Doppler, and color Doppler. Height: Height: 154.9cm. Height: 61in.   Weight: Weight: 65.9kg. Weight: 145lb. Body mass index: BMI:   27.5kg/m^2. Body surface area: BSA: 1.60m^2. Patient status:   Outpatient. Location: Echo laboratory.    -------------------------------------------------------------------- (11/12/2009)  Cardiac Imaging Cardiac Cath Findings  CONCLUSION:   1. Nonobstructive coronary artery disease with 40% narrowing of the       proximal LAD, no significant obstruction of the circumflex and  right coronary artery.   2. Severe left ventricular dysfunction with global hypokinesis and an       estimated ejection fraction of 20-25% with pulmonary wedge pressure       of 15.      RECOMMENDATIONS:  The patient has severe dilated cardiomyopathy.  We   will plan to followup with Dr. [name] and continue medical management.               Bruce Alfonso Patten Olevia Perches, MD, [facility]     (12/10/2009)    Review  of Systems       The patient complains of peripheral edema.  The patient denies anorexia, fever, weight gain, chest pain, syncope, abdominal pain, melena, hematochezia, and severe indigestion/heartburn.         Otherwise reviewed and negative except as outlined.  Vital Signs:  Patient profile:   65 year old female Weight:      134 pounds BMI:     25.41 Pulse rate:   68 / minute Pulse (ortho):   66 / minute BP sitting:   144 / 80  (right arm) BP standing:   105 / 66  Vitals Entered By: Doretha Sou, CNA (January 21, 2010 1:05 PM)  Serial Vital Signs/Assessments:  Time      Position  BP       Pulse  Resp  Temp     By 1:49 PM   Lying RA  129/68   [address] CNA 1:49 PM   Sitting   120/63   65                    [address], CNA 1:49 PM   Standing  105/66   [address] CNA   Physical Exam  Additional Exam:  Normally nourished appearing woman in no acute distress. HEENT: Conjunctiva and lids normal, oropharynx clear. Neck: Supple, JVP 8 cm water, no carotid bruits, no thyromegaly. Lungs: Clear with diminished breath sounds, nonlabored. Cardiac: Indistinct PMI, regular rate and rhythm, paradoxically split S2, no S3. Abdomen: Soft, nontender, no hepatomegaly, bowel sounds present. Extremities: Trace ankle edema, distal pulses one plus. Skin: Warm and dry. Musculoskeletal: No kyphosis. Neuropsychiatric: Alert and oriented x3, affect appropriate.    Impression & Recommendations:  Problem # 1:  CHRONIC SYSTOLIC HEART FAILURE (123456)  Patient appears euvolemic today. Her weight is stable. She did have some orthostatic change in blood pressure, although no frank hypotension. Heart rate was stable during this time. I have recommended that she take her losartan in the evening, otherwise continue her present regimen. A followup 2-D echocardiogram will be obtained prior to her visit in the next 6 weeks.  Her updated medication  list for this problem includes:    Aspir-low 81 Mg Tbec (Aspirin) .Marland Kitchen... Take 1 tab daily    Losartan Potassium 50 Mg Tabs (Losartan potassium) .Marland Kitchen... Take 1 tablet by mouth at bedtime    Furosemide 40 Mg Tabs (Furosemide) .Marland Kitchen... Take 1 tablet by mouth once daily    Carvedilol 6.25 Mg Tabs (Carvedilol) .Marland Kitchen... Take 1 tablet by mouth two times a day    Nitrostat 0.4 Mg Subl (Nitroglycerin) .Marland Kitchen... Take as directed for chestpain  Problem # 2:  ESSENTIAL HYPERTENSION, BENIGN (ICD-401.1)  No uptitration  of therapy undertaken at this point.  Her updated medication list for this problem includes:    Aspir-low 81 Mg Tbec (Aspirin) .Marland Kitchen... Take 1 tab daily    Losartan Potassium 50 Mg Tabs (Losartan potassium) .Marland Kitchen... Take 1 tablet by mouth at bedtime    Furosemide 40 Mg Tabs (Furosemide) .Marland Kitchen... Take 1 tablet by mouth once daily    Carvedilol 6.25 Mg Tabs (Carvedilol) .Marland Kitchen... Take 1 tablet by mouth two times a day  Problem # 3:  CORONARY ATHEROSCLEROSIS NATIVE CORONARY ARTERY (ICD-414.01)  Nonobstructive coronary atherosclerosis documented at recent cardiac catheterization.  Her updated medication list for this problem includes:    Aspir-low 81 Mg Tbec (Aspirin) .Marland Kitchen... Take 1 tab daily    Carvedilol 6.25 Mg Tabs (Carvedilol) .Marland Kitchen... Take 1 tablet by mouth two times a day    Nitrostat 0.4 Mg Subl (Nitroglycerin) .Marland Kitchen... Take as directed for chestpain  Other Orders: 2-D Echocardiogram (2D Echo)  Patient Instructions: 1)  Your physician recommends that you schedule a follow-up appointment in: 6weeks 2)  Your physician recommends that you continue on your current medications as directed. Please refer to the Current Medication list given to you today. 3)  ***Please take Losartan dose at bedtime*** 4)  Your physician has requested that you have an echocardiogram.  Echocardiography is a painless test that uses sound waves to create images of your heart. It provides your doctor with information about the size and shape  of your heart and how well your heart's chambers and valves are working.  This procedure takes approximately one hour. There are no restrictions for this procedure.

## 2010-11-04 NOTE — Assessment & Plan Note (Signed)
Summary: post cath per [facility] cath lab/sn   Visit Type:  Follow-up Primary Provider:  Dr. Marjean Donna   History of Present Illness: [name] presents for a followup visit. She was referred recently for a diagnostic cardiac catheterization in light of newly diagnosed cardiomyopathy, with LVEF of 20-25%. Fortunately she had nonobstructive coronary artery disease, and is to be managed medically at this point with an apparent nonischemic cardiomyopathy. Pulmonary capillary wedge pressure was 15 mmHg at catheterization.  From a symptom perspective she is reporting NYHA class 2-3 dyspnea and exertion, improvement in lower extremity edema, and some feeling of orthostatic dizziness. Orthostatic measurements were made today and reported below. She continues on twice daily Lasix.  We discussed the results and implications of her testing, plan for further optimization of medical therapy, and ultimately a repeat echocardiogram over time to reassess left ventricle systolic function. She states that she has completed some paperwork for her job regarding general timing of her return to work, although we have not received any of these for review.  She also tells me that she has a pending evaluation [facility] for resection of a bladder tumor. I have no other details about this as yet.  Current Medications (verified): 1)  Aspir-Low 81 Mg Tbec (Aspirin) .... Take 1 Tab Daily 2)  Losartan Potassium 50 Mg Tabs (Losartan Potassium) .... Take 1 Tab Daily 3)  Furosemide 40 Mg Tabs (Furosemide) .... Take 1 Tablet By Mouth Once Daily 4)  Klor-Con 10 10 Meq Cr-Tabs (Potassium Chloride) .... Take 1 Tablet By Mouth Once Daily 5)  Carvedilol 6.25 Mg Tabs (Carvedilol) .... Take 1 Tablet By Mouth Two Times A Day 6)  Meclizine Hcl 12.5 Mg Tabs (Meclizine Hcl) .... Take 1 Tab Qid   Hold 7)  Nitrostat 0.4 Mg Subl (Nitroglycerin) .... Take As Directed For Chestpain 8)  Lantus 100 Unit/ml Soln (Insulin Glargine) .... 40  Units Daily  Allergies (verified): 1)  ! Vicodin  Past History:  Social History: Last updated: 12/05/2009 Full Time - [facility] Tobacco Use - No Alcohol Use - no 2 children  Past Medical History: Diabetes Type 2 Hypertension Nonischemic cardiomyopathy, LVEF 20-25%  Clinical Review Panels:  Echocardiogram Echocardiogram   Study Conclusions    - Left ventricle: The cavity size was mildly dilated. Wall thickness     was increased increased in a pattern of mild to moderate LVH.     Systolic function was severely reduced. The estimated ejection     fraction was in the range of 20% to 25%. Virtual akinesis of the     anteroseptal myocardium; moderate hypokinesis of the inferolateral     base. Other segments are severely hypokinetic. There was mild     spontaneous echo contrast, indicative of stasis.   - Ventricular septum: Septal motion showed mild paradox.   - Mitral valve: Mild regurgitation.   - Left atrium: The atrium was mildly dilated.   - Right ventricle: The cavity size was normal. Wall thickness was     mildly increased.   - Pulmonary arteries: Systolic pressure was mildly increased.   - Pericardium, extracardiac: A small, free-flowing circumferential     pericardial effusion was most prominent anterior to the heart. The     fluid had no internal echoes.   Transthoracic echocardiography. M-mode, complete 2D, spectral   Doppler, and color Doppler. Height: Height: 154.9cm. Height: 61in.   Weight: Weight: 65.9kg. Weight: 145lb. Body mass index: BMI:   27.5kg/m^2. Body surface area: BSA:  1.36m^2. Patient status:   Outpatient. Location: Echo laboratory.    -------------------------------------------------------------------- (11/12/2009)  Cardiac Imaging Cardiac Cath Findings  CONCLUSION:   1. Nonobstructive coronary artery disease with 40% narrowing of the       proximal LAD, no significant obstruction of the circumflex and       right coronary artery.   2.  Severe left ventricular dysfunction with global hypokinesis and an       estimated ejection fraction of 20-25% with pulmonary wedge pressure       of 15.      RECOMMENDATIONS:  The patient has severe dilated cardiomyopathy.  We   will plan to followup with Dr. [name] and continue medical management.               Bruce Alfonso Patten Olevia Perches, MD, [facility]     (12/10/2009)    Review of Systems  The patient denies anorexia, fever, weight gain, chest pain, syncope, prolonged cough, melena, and hematochezia.         Otherwise reviewed and negative except as outlined above.  Vital Signs:  Patient profile:   Sharon year old female Height:      61 inches Weight:      134 pounds O2 Sat:      99 % on Room air Pulse rate:   75 / minute Pulse (ortho):   71 / minute BP sitting:   133 / 82  (left arm) BP standing:   104 / 67  Vitals Entered By: Tye Savoy RN (December 31, 2009 1:09 PM)  O2 Flow:  Room air  Serial Vital Signs/Assessments:  Time      Position  BP       Pulse  Resp  Temp     By 1:24 PM   Lying RA  140/79   72                    Tammy Sanders RN 1:24 PM   Sitting   124/75   72                    Tammy Sanders RN 1:24 PM   Standing  104/67   71                    Tammy Sanders RN  Comments: 1:24 PM lying-no signs sitting-no sx standing- no signs By: Tye Savoy RN    Physical Exam  Additional Exam:  Normally nourished appearing [name] in no acute distress. HEENT: Conjunctiva and lids normal, oropharynx clear. Neck: Supple, JVP 8 cm water, no carotid bruits, no thyromegaly. Lungs: Clear with diminished breath sounds, nonlabored. Cardiac: Indistinct PMI, regular rate and rhythm, paradoxically split S2, no S3. Abdomen: Soft, nontender, no hepatomegaly, bowel sounds present. Extremities: Trace ankle edema, distal pulses one plus. Skin: Warm and dry. Musculoskeletal: No kyphosis. Neuropsychiatric: Alert and oriented x3, affect appropriate.    EKG  Procedure date:   12/31/2009  Findings:      Normal sinus rhythm with left bundle branch block, 76 beats per minute.  Impression & Recommendations:  Problem # 1:  UNSPECIFIED SECONDARY CARDIOMYOPATHY (ICD-425.9)  Nonischemic cardiomyopathy as outlined above status post cardiac catheterization demonstrating only mild, nonobstructive coronary atherosclerosis. Medical therapy is planned at this time. Coreg will be increased to 6.25 mg p.o. b.i.d., and we will concurrently reduce Lasix to once daily dosing with a reduction of potassium as well. I will have her followup over the next 3  weeks for further clinical assessment and medication titration as tolerated. A CMET will be obtained prior to her next visit.  Her updated medication list for this problem includes:    Aspir-low 81 Mg Tbec (Aspirin) .Marland Kitchen... Take 1 tab daily    Losartan Potassium 50 Mg Tabs (Losartan potassium) .Marland Kitchen... Take 1 tab daily    Furosemide 40 Mg Tabs (Furosemide) .Marland Kitchen... Take 1 tablet by mouth once daily    Carvedilol 6.25 Mg Tabs (Carvedilol) .Marland Kitchen... Take 1 tablet by mouth two times a day    Nitrostat 0.4 Mg Subl (Nitroglycerin) .Marland Kitchen... Take as directed for chestpain  Problem # 2:  CHRONIC SYSTOLIC HEART FAILURE (123456)  Patient's general volume status seems to be better with less edema, and one-pound weight reduction. She does have some orthostatic blood pressure change, and diuresis is being reduced as already discussed. We talked about checking her weight daily.  Her updated medication list for this problem includes:    Aspir-low 81 Mg Tbec (Aspirin) .Marland Kitchen... Take 1 tab daily    Losartan Potassium 50 Mg Tabs (Losartan potassium) .Marland Kitchen... Take 1 tab daily    Furosemide 40 Mg Tabs (Furosemide) .Marland Kitchen... Take 1 tablet by mouth once daily    Carvedilol 6.25 Mg Tabs (Carvedilol) .Marland Kitchen... Take 1 tablet by mouth two times a day    Nitrostat 0.4 Mg Subl (Nitroglycerin) .Marland Kitchen... Take as directed for chestpain  Problem # 3:  CORONARY ATHEROSCLEROSIS NATIVE  CORONARY ARTERY (ICD-414.01)  Mild nonobstructive disease and recent cardiac catheterization. No active anginal symptoms. She will continue one aspirin daily.  Her updated medication list for this problem includes:    Aspir-low 81 Mg Tbec (Aspirin) .Marland Kitchen... Take 1 tab daily    Carvedilol 6.25 Mg Tabs (Carvedilol) .Marland Kitchen... Take 1 tablet by mouth two times a day    Nitrostat 0.4 Mg Subl (Nitroglycerin) .Marland Kitchen... Take as directed for chestpain  Problem # 4:  HYPERLIPIDEMIA (B2193296.4)  A fasting lipid profile will be obtained prior to her next visit. We can discuss statin therapy at that time if required.  Future Orders: T-Lipid Profile [id number]) ... 01/14/2010 T-Comprehensive Metabolic Panel ([id number]) ... 01/14/2010  Patient Instructions: 1)  Your physician recommends that you schedule a follow-up appointment in: 3 weeks 2)  Your physician recommends that you return for lab work in: 3 weeks, just before next office visit 3)  Your physician has recommended you make the following change in your medication:    Increase Coreg to 6.25mg  by mouth two times a day, decrease potassium to 72meq by mouth once daily and decrease Lasix to 40mg  by mouth once daily  Prescriptions: FUROSEMIDE 40 MG TABS (FUROSEMIDE) take 1 tablet by mouth once daily  #30 x 2   Entered by:   Jeani Hawking Via LPN   Authorized by:   Beckie Salts, MD, [facility]   Signed by:   Jeani Hawking Via LPN on [id number]   Method used:   Electronically to        [facility] (retail)       [place] [address]       [place], [place]  [address]       Ph: [phone]       Fax: [phone]   RxID:   [id number] KLOR-CON 10 10 MEQ CR-TABS (POTASSIUM CHLORIDE) take 1 tablet by mouth once daily  #30 x 2   Entered by:   Jeani Hawking Via LPN   Authorized by:   Roney Marion  [name], MD, [facility]   Signed by:   Jeani Hawking Via LPN on [id number]   Method used:   Electronically to        [facility] (retail)       [place] [address]       [place], [place]  [address]       Ph: [phone]       Fax: [phone]   RxID:   [id number] CARVEDILOL 6.25 MG TABS (CARVEDILOL) take 1 tablet by mouth two times a day  #60 x 2   Entered by:   Jeani Hawking Via LPN   Authorized by:   Beckie Salts, MD, [facility]   Signed by:   Jeani Hawking Via LPN on [id number]   Method used:   Electronically to        [facility] (retail)       [place] [address]       [place], [place]  [address]       Ph: [phone]       Fax: [phone]   RxID:   [id number]

## 2010-11-04 NOTE — Progress Notes (Signed)
**Note De-Identified Nanea Jared Obfuscation** Summary: Medication Questions  Phone Note Call from Patient   Caller: Patient Reason for Call: Talk to Nurse Summary of Call: pt has questions regarding medications that were prescribed at visit on 12/31/09/tg Initial call taken by: Alphonsus Sias [facility],  January 01, 2010 3:02 PM  Follow-up for Phone Call        [facility] Follow-up by: Jeani Hawking Lyndal Alamillo LPN,  March 31, 624THL 9:39 AM  Additional Follow-up for Phone Call Additional follow up Details #1::        Pt. concerned that if she starts taking Carvedilol 6.25mg  two times a day along with Losartan 50mg  once daily her BP will drop. Pt. advised to try meds at recommended dose and that if she has problems to notify office. She states she understands info. given. Additional Follow-up by: Jeani Hawking Amaru Burroughs LPN,  April  1, 624THL 9:01 AM    Additional Follow-up for Phone Call Additional follow up Details #2::    Could have nurse BP check in 2 weeks. Follow-up by: Beckie Salts, MD, [facility],  January 03, 2010 11:57 AM  Additional Follow-up for Phone Call Additional follow up Details #3:: Details for Additional Follow-up Action Taken: Pt. already has appt. on 01-21-10 at 1:00pm with Dr. [name]. Pt. is aware. Additional Follow-up by: Jeani Hawking Talley Casco LPN,  April  5, 624THL 1:21 PM    [facility].  Jeani Hawking Elijah Michaelis LPN  April  1, 624THL [id number] PM

## 2010-11-04 NOTE — Assessment & Plan Note (Signed)
Summary: **NP6 EDEMA, CHF   Visit Type:  Initial Consult Primary Provider:  Dr. Marjean Donna   History of Present Illness: 65 year old woman referred for consultation. She presents with a history of progressive lower extremity edema, beginning in her ankles approximately 3 months ago, and progressing upward, subsequently associated with increased abdominal girth, and ultimately urinary retention. She was seen in the emergency department and has been evaluated by urology, in fact with a urinary catheter in place at this point related to urinary retention. She is due to have this taken out on Monday, assuming there is no residual bladder distention. Recent labs from February 4 revealed hemoglobin 10.6, platelets 177, sodium 140, potassium 3.7, BUN 18, creatinine 0.8. Renal ultrasound demonstrated essentially normal kidney size with no focal lesions.  As part of her evaluation with lower extremity edema, a BNP level was obtained, noted to be 633. This led to a 2-D echocardiogram done as an outpatient back on February 8, revealing a left ventricular ejection fraction of 20-25% with mild to moderate LVH, mildly dilated chamber size, and akinesis of the anteroseptal wall with moderate inferolateral hypokinesis. Spontaneous echo contrast was described, although no frank mural thrombus was mentioned. Otherwise there was mild mitral regurgitation, mildly increased right ventricular systolic pressure, and a small pericardial effusion. She has been managed by Dr. Everette Rank in the interim, now on Coreg, and is referred to [place] to discuss further assessment.  In addition to the symptoms outlined above, Ms. Fegley indicates a history of intermittent chest pain over the last several months, with both typical and atypical features, usually no more than mild to moderate in intensity, and not prolonged. She has NYHA class 2-3 dyspnea exertion. She has had no palpitations or syncope.  Other than the recent cardiac  testing, Ms. Dobish has not undergone prior cardiovascular assessment, and is not aware of any diagnosis of coronary artery disease or previous myocardial infarction.  Preventive Screening-Counseling & Management  Alcohol-Tobacco     Smoking Status: never  Current Medications (verified): 1)  Aspir-Low 81 Mg Tbec (Aspirin) .... Take 1 Tab Daily 2)  Losartan Potassium 50 Mg Tabs (Losartan Potassium) .... Take 1 Tab Daily 3)  Furosemide 40 Mg Tabs (Furosemide) .... Take 1 Tab Two Times A Day 4)  Potassium Chloride 20 Meq/42ml (10%) Liqd (Potassium Chloride) .... Take 1 Tab Daily 5)  Carvedilol 3.125 Mg Tabs (Carvedilol) .... Take 1 Tab Two Times A Day 6)  Meclizine Hcl 12.5 Mg Tabs (Meclizine Hcl) .... Take 1 Tab Qid 7)  Nitrostat 0.4 Mg Subl (Nitroglycerin) .... Take As Directed For Chestpain 8)  Lantus 100 Unit/ml Soln (Insulin Glargine) .... 40 Units Daily  Allergies (verified): No Known Drug Allergies  Past History:  Family History: Last updated: 12/07/2009 Father: died in his 45s with cirrhosis Mother: died age 65 with ovarian cancer  Social History: Last updated: 2009-12-07 Full Time - [facility] [facility] Tobacco Use - No Alcohol Use - no 2 children  Past Medical History: Diabetes Type 2 Hypertension  Past Surgical History: Hysterectomy Left shoulder surgery Lumpectomy  Clinical Review Panels:  CXR CXR results  Clinical Data: Urinary retention.    CHEST - 2 VIEW    Comparison: None    Findings: There is cardiomegaly with vascular congestion.  No   confluent opacities, effusions or edema.  No acute bony   abnormality.    IMPRESSION:   Cardiomegaly, vascular congestion.    Read By:  Rolm Baptise,  M.D.   Released By:  Rolm Baptise,  M.D.  Additional Information  HL7 RESULT STATUS : F  External image : [id number]  External IF Update Timestamp : 2009-11-12:09:49:03.000000 (11/12/2009)  Echocardiogram Echocardiogram   Study Conclusions    -  Left ventricle: The cavity size was mildly dilated. Wall thickness     was increased increased in a pattern of mild to moderate LVH.     Systolic function was severely reduced. The estimated ejection     fraction was in the range of 20% to 25%. Virtual akinesis of the     anteroseptal myocardium; moderate hypokinesis of the inferolateral     base. Other segments are severely hypokinetic. There was mild     spontaneous echo contrast, indicative of stasis.   - Ventricular septum: Septal motion showed mild paradox.   - Mitral valve: Mild regurgitation.   - Left atrium: The atrium was mildly dilated.   - Right ventricle: The cavity size was normal. Wall thickness was     mildly increased.   - Pulmonary arteries: Systolic pressure was mildly increased.   - Pericardium, extracardiac: A small, free-flowing circumferential     pericardial effusion was most prominent anterior to the heart. The     fluid had no internal echoes.   Transthoracic echocardiography. M-mode, complete 2D, spectral   Doppler, and color Doppler. Height: Height: 154.9cm. Height: 61in.   Weight: Weight: 65.9kg. Weight: 145lb. Body mass index: BMI:   27.5kg/m^2. Body surface area: BSA: 1.100m^2. Patient status:   Outpatient. Location: Echo laboratory.    -------------------------------------------------------------------- (11/12/2009)    Family History: Father: died in his 60s with cirrhosis Mother: died age 16 with ovarian cancer  Social History: Full Time - [facility] Tobacco Use - No Alcohol Use - no 2 childrenSmoking Status:  never  Review of Systems  The patient denies anorexia, fever, weight loss, syncope, prolonged cough, headaches, hemoptysis, melena, hematochezia, and severe indigestion/heartburn.         Patient reports improvement in lower extremity edema, no orthopnea or PND. Otherwise reviewed and negative except as outlined above.  Vital Signs:  Patient profile:   65 year old  female Height:      61 inches Weight:      137 pounds BMI:     25.98 Pulse rate:   67 / minute BP sitting:   142 / 84  (right arm)  Vitals Entered By: Doretha Sou, CNA (December 05, 2009 3:18 PM)  Physical Exam  Additional Exam:  Normally nourished appearing woman in no acute distress. Has urinary catheter with leg bag. HEENT: Conjunctiva and lids normal, oropharynx clear. Neck: Supple, JVP 10 cm water, no carotid bruits, no thyromegaly. Lungs: Clear with diminished breath sounds, nonlabored. Cardiac: Indistinct PMI, regular rate and rhythm, paradoxically split S2, no S3. Abdomen: Soft, nontender, no hepatomegaly, bowel sounds present. Extremities: Trace to 1+ edema below the knees, distal pulses one plus. Skin: Warm and dry. Musculoskeletal: No kyphosis. Neuropsychiatric: Alert and oriented x3, affect appropriate.    EKG  Procedure date:  12/05/2009  Findings:      Normal sinus rhythm at 68 beats per minute with left bundle branch block. This is old compared to previous tracing.  Impression & Recommendations:  Problem # 1:  CARDIOMYOPATHY, ISCHEMIC (ICD-414.8)  Newly diagnosed cardiomyopathy, LVEF of 20-25%, associated with wall motion abnormalities raising suspicion for an ischemic cardiomyopathy and underlying cardiovascular disease. Supporting this are cardiac risk factors including hypertension and type 2 diabetes mellitus, as well as a history  of intermittent chest pain, dyspnea on exertion, and progressive heart failure symptoms over the last few months. Patient's electrocardiogram shows a left bundle branch block which is old based on records provided by Dr. Everette Rank. I reviewed the present situation and probable diagnoses with the patient, and we discussed proceeding to a right and left heart catheterization to better understand hemodynamics and her coronary anatomy, with an eye toward potential revascularization options. We discussed the risk benefit profile, and she is in  agreement to proceed. Present medical regimen is reasonable. We plan followup labs in the meanwhile to reassess renal function. Procedure will be scheduled for next week with Dr. Burt Knack, after he she has had time to see her urologist on Monday and have the urinary catheter removed.  Her updated medication list for this problem includes:    Aspir-low 81 Mg Tbec (Aspirin) .Marland Kitchen... Take 1 tab daily    Losartan Potassium 50 Mg Tabs (Losartan potassium) .Marland Kitchen... Take 1 tab daily    Furosemide 40 Mg Tabs (Furosemide) .Marland Kitchen... Take 1 tab two times a day    Carvedilol 3.125 Mg Tabs (Carvedilol) .Marland Kitchen... Take 1 tab two times a day    Nitrostat 0.4 Mg Subl (Nitroglycerin) .Marland Kitchen... Take as directed for chestpain  Problem # 2:  LEFT BUNDLE BRANCH BLOCK (ICD-426.3)  This appears to be old.  Her updated medication list for this problem includes:    Aspir-low 81 Mg Tbec (Aspirin) .Marland Kitchen... Take 1 tab daily    Carvedilol 3.125 Mg Tabs (Carvedilol) .Marland Kitchen... Take 1 tab two times a day    Nitrostat 0.4 Mg Subl (Nitroglycerin) .Marland Kitchen... Take as directed for chestpain  Problem # 3:  ESSENTIAL HYPERTENSION, BENIGN (ICD-401.1)  Patient reports at least a five-year history of high blood pressure. Will generally plan to continue medication titration for more optimal control.  Her updated medication list for this problem includes:    Aspir-low 81 Mg Tbec (Aspirin) .Marland Kitchen... Take 1 tab daily    Losartan Potassium 50 Mg Tabs (Losartan potassium) .Marland Kitchen... Take 1 tab daily    Furosemide 40 Mg Tabs (Furosemide) .Marland Kitchen... Take 1 tab two times a day    Carvedilol 3.125 Mg Tabs (Carvedilol) .Marland Kitchen... Take 1 tab two times a day  Other Orders: Cardiac Catheterization (Cardiac Cath) T-Basic Metabolic Panel (99991111) T-CBC w/Diff [phone]) T-Protime, Auto [id number]) T-PTT [id number])  Patient Instructions: 1)  Your physician recommends that you schedule a follow-up appointment in: post cath 2)  Your physician recommends that you return for lab  work in: today 3)  Your physician has requested that you have a cardiac catheterization.  Cardiac catheterization is used to diagnose and/or treat various heart conditions. Doctors may recommend this procedure for a number of different reasons. The most common reason is to evaluate chest pain. Chest pain can be a symptom of coronary artery disease (CAD), and cardiac catheterization can show whether plaque is narrowing or blocking your heart's arteries. This procedure is also used to evaluate the valves, as well as measure the blood flow and oxygen levels in different parts of your heart.  For further information please visit [email or web address].  Please follow instruction sheet, as given.

## 2010-11-04 NOTE — Letter (Signed)
Summary: [facility] at [facility]. [address] [place] [address]   Phone: [phone]  Fax: [phone]     March 18, 2010    [name]   The above named patient had a medical visit today at: 10:00     am.  Please take this into consideration when reviewing the time away from work/school.      Sincerely yours,  Rozann Lesches, MD, [place]

## 2010-11-04 NOTE — Assessment & Plan Note (Signed)
Summary: 3-4 wk f/u per checkout on 05/01/10/tg   Visit Type:  Follow-up Primary [place] Els[place]nd:  Dr. Marjean Donna   History of Present Illness: 65 year old woman presents for followup. I saw her back in July. She is here with her daughter. Continues to report NYHA class 2-3 dyspnea on exertion and occasional chest discomfort. She states that she used as needed Lasix for just a few days related to lower extremity swelling, with good effect, since I last saw her.  Followup labs from 15 to August showed sodium 143, potassium 4.6, BUN 28, creatinine 0.7, BNP 298. We reviewed these.  No palpitations or syncope. She does have mild orthostatic dizziness when she is standing for a period of time. No falls.  I again discussed with her the possibility of proceeding with a cardiopulmonary stress test for more objective assessment of her functional limitation, since she continues to complain of significant fatigue. I also again discussed with her potential for device therapy, specifically a defibrillator and perhaps resynchronization therapy. She states that she has considered this, and prefers to hold off at this point.  Clinical Review Panels:  Echocardiogram Echocardiogram Study Conclusions    - Left ventricle: The cavity size was mildly dilated. Wall thickness     was increased in a pattern of moderate LVH. Systolic function was     severely reduced. The estimated ejection fraction was in the range     of 15% to 20%. Doppler parameters are consistent with abnormal     left ventricular relaxation (grade 1 diastolic dysfunction).   - Ventricular septum: Septal motion showed abnormal function and     dyssynergy.   - Aortic valve: Mildly calcified annulus. Trileaflet; mildly     calcified leaflets. Trivial regurgitation. Peak gradient: 42mm Hg     (S).   - Mitral valve: Mild regurgitation.   - Left atrium: The atrium was mildly dilated.   - Right ventricle: Systolic function was normal.   -  Tricuspid valve: Mild regurgitation.   - Pulmonary arteries: PA peak pressure: 91mm Hg (S).   - Pericardium, extracardiac: A small pericardial effusion was     identified. (01/22/2010)    Current Medications (verified): 1)  Aspir-Low 81 Mg Tbec (Aspirin) .... Take 1 Tab Daily 2)  Losartan Potassium 25 Mg Tabs (Losartan Potassium) .... Take 1 in Am and 2 in Pm 3)  Furosemide 20 Mg Tabs (Furosemide) .... Take 1 Tablet By Mouth Prn  For Swelling 4)  Klor-Con 10 10 Meq Cr-Tabs (Potassium Chloride) .... Take 1 Tablet By Mouth As Needed (Take When You Take Furosemide) 5)  Carvedilol 6.25 Mg Tabs (Carvedilol) .... Take 1 Tablet By Mouth Two Times A Day 6)  Nitrostat 0.4 Mg Subl (Nitroglycerin) .... Take As Directed For Chestpain 7)  Lantus 100 Unit/ml Soln (Insulin Glargine) .... 40 Units Daily 8)  Digoxin 0.125 Mg Tabs (Digoxin) .... Take 1 Tablet By Mouth Once Daily 9)  Colace 100 Mg Caps (Docusate Sodium) .... Take 1 Tablet Daily As Needed For Constipation 10)  Aldactone 25 Mg Tabs (Spironolactone) .... Take 1 Tablet By Mouth By Mouth Once Daily 11)  Vitamin D3 1000000 Unit/gm Liqd (Cholecalciferol) .... Take 1 Tab Daily  Allergies (verified): 1)  ! Vicodin  Past History:  Past Medical History: Last updated: 05/01/2010 Diabetes Type 2 Hypertension Nonischemic cardiomyopathy, LVEF 20% CAD - nonobstructive  Social History: Last updated: 03/18/2010 Full Time - [employer] [facility] (out of work) Tobacco Use - No Alcohol Use - no 2  children  Review of Systems       The patient complains of chest pain and dyspnea on exertion.  The patient denies anorexia, fever, weight gain, syncope, prolonged cough, headaches, melena, and hematochezia.         Otherwise reviewed and negative except as outlined.  Vital Signs:  Patient profile:   65 year old female Weight:      133 pounds Pulse rate:   63 / minute BP sitting:   130 / 76  (right arm)  Vitals Entered By: Doretha Sou, CNA  (May 28, 2010 8:32 AM)  Physical Exam  Additional Exam:  Normally nourished appearing woman in no acute distress. HEENT: Conjunctiva and lids normal, oropharynx clear. Neck: Supple, JVP 8 cm water, no carotid bruits, no thyromegaly. Lungs: Clear with diminished breath sounds, nonlabored. Cardiac: Indistinct PMI, regular rate and rhythm, paradoxically split S2, no S3. Abdomen: Soft, nontender, no hepatomegaly, bowel sounds present. Extremities: Trace ankle edema, distal pulses one plus. Skin: Warm and dry. Musculoskeletal: No kyphosis. Neuropsychiatric: Alert and oriented x3, affect appropriate.    Impression & Recommendations:  Problem # 1:  CHRONIC SYSTOLIC HEART FAILURE (123456)  Symptomatically stable, with reported functional limitations related to fatigue and shortness of breath, although NYHA class 2-3. Medical therapy looks fairly good at this point. We have been somewhat limited in up-titration based on orthostasis. Labs are also stable. As noted above, I have discussed with her more objective evaluation via cardiopulmonary stress testing, and also the possibility of defibrillator therapy as well as resynchronization therapy. At this point she prefers to hold off on these options. I will see her back in one month with followup BMET and BNP. No medication changes made today.  Her updated medication list for this problem includes:    Aspir-low 81 Mg Tbec (Aspirin) .Marland Kitchen... Take 1 tab daily    Losartan Potassium 25 Mg Tabs (Losartan potassium) .Marland Kitchen... Take 1 in am and 2 in pm    Furosemide 20 Mg Tabs (Furosemide) .Marland Kitchen... Take 1 tablet by mouth prn  for swelling    Carvedilol 6.25 Mg Tabs (Carvedilol) .Marland Kitchen... Take 1 tablet by mouth two times a day    Nitrostat 0.4 Mg Subl (Nitroglycerin) .Marland Kitchen... Take as directed for chestpain    Digoxin 0.125 Mg Tabs (Digoxin) .Marland Kitchen... Take 1 tablet by mouth once daily    Aldactone 25 Mg Tabs (Spironolactone) .Marland Kitchen... Take 1 tablet by mouth by mouth once  daily  Future Orders: T-BNP  (B Natriuretic Peptide) [id number]) ... 06/23/2010  Her updated medication list for this problem includes:    Aspir-low 81 Mg Tbec (Aspirin) .Marland Kitchen... Take 1 tab daily    Losartan Potassium 25 Mg Tabs (Losartan potassium) .Marland Kitchen... Take 1 in am and 2 in pm    Furosemide 20 Mg Tabs (Furosemide) .Marland Kitchen... Take 1 tablet by mouth prn  for swelling    Carvedilol 6.25 Mg Tabs (Carvedilol) .Marland Kitchen... Take 1 tablet by mouth two times a day    Nitrostat 0.4 Mg Subl (Nitroglycerin) .Marland Kitchen... Take as directed for chestpain    Digoxin 0.125 Mg Tabs (Digoxin) .Marland Kitchen... Take 1 tablet by mouth once daily    Aldactone 25 Mg Tabs (Spironolactone) .Marland Kitchen... Take 1 tablet by mouth by mouth once daily  Problem # 2:  UNSPECIFIED SECONDARY CARDIOMYOPATHY (ICD-425.9)  Nonischemic cardiomyopathy, LVEF approximately 20%, with documentation of nonobstructive coronary atherosclerosis at catheterization.  Her updated medication list for this problem includes:    Aspir-low 81 Mg Tbec (Aspirin) .Marland Kitchen... Take 1  tab daily    Losartan Potassium 25 Mg Tabs (Losartan potassium) .Marland Kitchen... Take 1 in am and 2 in pm    Furosemide 20 Mg Tabs (Furosemide) .Marland Kitchen... Take 1 tablet by mouth prn  for swelling    Carvedilol 6.25 Mg Tabs (Carvedilol) .Marland Kitchen... Take 1 tablet by mouth two times a day    Nitrostat 0.4 Mg Subl (Nitroglycerin) .Marland Kitchen... Take as directed for chestpain    Digoxin 0.125 Mg Tabs (Digoxin) .Marland Kitchen... Take 1 tablet by mouth once daily    Aldactone 25 Mg Tabs (Spironolactone) .Marland Kitchen... Take 1 tablet by mouth by mouth once daily  Other Orders: Future Orders: T-Basic Metabolic Panel (99991111) ... 06/23/2010  Patient Instructions: 1)  Your physician recommends that you schedule a follow-up appointment in: 34month 2)  Your physician recommends that you return for lab work in: 1 month, just before next visit 3)  Your physician recommends that you continue on your current medications as directed. Please refer to the Current Medication  list given to you today. Prescriptions: FUROSEMIDE 20 MG TABS (FUROSEMIDE) take 1 tablet by mouth prn  for swelling  #30 x 3   Entered by:   Jeani Hawking Via LPN   Authorized by:   Beckie Salts, MD, [facility]   Signed by:   Jeani Hawking Via LPN on [id number]   Method used:   Electronically to        [facility] (retail)       [place] [address] [place], [place]  [address]       Ph: [phone]       Fax: [phone]   RxID:   [id number]

## 2010-11-04 NOTE — Miscellaneous (Signed)
Summary: bmp,lipids  Clinical Lists Changes  Observations: Added new observation of CREATININE: 0.78 mg/dL (11/15/2009 9:50) Added new observation of BUN: 16 mg/dL (11/15/2009 9:50) Added new observation of BG RANDOM: 271 mg/dL (11/15/2009 9:50) Added new observation of CO2 PLSM/SER: 24 meq/L (11/15/2009 9:50) Added new observation of CL SERUM: 103 meq/L (11/15/2009 9:50) Added new observation of K SERUM: 4.3 meq/L (11/15/2009 9:50) Added new observation of NA: 139 meq/L (11/15/2009 9:50) Added new observation of LDL: 90 mg/dL (11/15/2009 9:50) Added new observation of HDL: 66 mg/dL (11/15/2009 9:50) Added new observation of TRIGLYC TOT: 34 mg/dL (11/15/2009 9:50) Added new observation of CHOLESTEROL: 163 mg/dL (11/15/2009 9:50)

## 2010-11-04 NOTE — Letter (Signed)
Summary: rythum strip  rythum strip   Imported By: Nevada Crane 12/09/2009 12:19:09  _____________________________________________________________________  External Attachment:    Type:   Image     Comment:   External Document

## 2010-11-04 NOTE — Letter (Signed)
Summary: [facility], general practice at [facility]. [address], [place] [address]   Phone: [phone]  Fax: [phone]      January 20, 2010 MRN: [id number]   [name] [place], [place]  [address]   Dear Ms. Purewal,  Your test ordered by Rande Lawman has been reviewed by your physician (or physician assistant) and was found to be normal or stable. Your physician (or physician assistant) felt no changes were needed at this time.  ____ Echocardiogram  ____ Cardiac Stress Test  __X__ Lab Work  ____ Peripheral vascular study of arms, legs or neck  ____ CT scan or X-ray  ____ Lung or Breathing test  ____ Other: Please continue on current medical treatment. Results of labs may further be discussed at next office visit.  Thank you.   Sharon Lesches, MD, F.A.C.C

## 2010-11-04 NOTE — Progress Notes (Signed)
Summary: Needs letter for out of work  Phone Note Call from Patient   Caller: Patient Reason for Call: Talk to Nurse Summary of Call: pt states that she needs letter stating that she was unable to work from 03/18/10 til her upcoming appt which is 07/01/10 due to Congestive Heart Failure /tg Initial call taken by: Alphonsus Sias [facility],  June 12, 2010 2:01 PM  Follow-up for Phone Call        Pt. advised that Dr. [name] did not recommend that she not work due to any Cardiac issues. Pt. states that Dr. Everette Rank took her out of work due to bladder, cardiac and other health issues. Pt. advised to call Dr. Pearline Cables office to obtain out of work letter. She requested that we give her a letter showing all appt. dates she has had with [place]. I agreed but later was advised by Coralyn Mark, one of our clincic front office reps,  that pt. has been given a copy of every router since her first [place] with [place]. These routers can and should be used to show all appts. with [place]. Follow-up by: Jeani Hawking Via LPN,  September  9, [id number] 10:21 AM  Additional Follow-up for Phone Call Additional follow up Details #1::        I spoke with Vinnie Level at Dr. Pearline Cables office and she states that pt. was given a note to return to work at last OV with them and that she would talk with Dr. Everette Rank concerning pt. continuing to be out of work. Also, I lefted detailed message on pt's voice mail to call office concerning note.   Additional Follow-up by: Jeani Hawking Via LPN,  September  9, 624THL 10:49 AM

## 2010-11-04 NOTE — Progress Notes (Signed)
Summary: FEET SWELLING AND CHEST TIGHTNESS  Phone Note Call from Patient Call back at Home Phone [phone]   Caller: pt Summary of Call: PT LEFT MESSAGE ON MACHINE 04/07/10 AT 4:21 PM. SHE IS CALLING BECAUSE HER FEET ARE SWOLLEN AND SHE HAS TIGHTNESS IN CHEST. SHE HAS APPT [facility] WITH DR MCDOWELL FOR 05/01/10 Initial call taken by: Nevada Crane,  April 08, 2010 9:10 AM  Follow-up for Phone Call        Pt called to let us know that her feet are puffy, and chest has been tight, no chest pain just tightness unable to breathe. Her  wt at home 132, at last ov 134.  She was  seen by neurlogy no new orders given per pt.  She is scheduled to see Dr. [name] 05/01/10 Follow-up by: Tye Savoy RN,  April 08, 2010 12:35 PM

## 2010-11-04 NOTE — Miscellaneous (Signed)
Summary: labs cbcd,pt,ptt,12/05/2009  Clinical Lists Changes  Observations: Added new observation of CALCIUM: 9.4 mg/dL (12/05/2009 8:55) Added new observation of CREATININE: 0.89 mg/dL (12/05/2009 8:55) Added new observation of BUN: 35 mg/dL (12/05/2009 8:55) Added new observation of BG RANDOM: 109 mg/dL (12/05/2009 8:55) Added new observation of CO2 PLSM/SER: 27 meq/L (12/05/2009 8:55) Added new observation of CL SERUM: 102 meq/L (12/05/2009 8:55) Added new observation of K SERUM: 3.9 meq/L (12/05/2009 8:55) Added new observation of NA: 139 meq/L (12/05/2009 8:55) Added new observation of PLATELETK/UL: 217 K/uL (12/05/2009 8:55) Added new observation of MCV: 88.7 fL (12/05/2009 8:55) Added new observation of HCT: 36.1 % (12/05/2009 8:55) Added new observation of HGB: 11.8 g/dL (12/05/2009 8:55) Added new observation of WBC COUNT: 7.4 10*3/microliter (12/05/2009 8:55) Added new observation of INR: 1.05  (12/05/2009 8:55) Added new observation of PT PATIENT: 13.6 s (12/05/2009 8:55)

## 2010-11-04 NOTE — Miscellaneous (Signed)
Summary: cardiac cath 12/10/2009  Clinical Lists Changes  Observations: Added new observation of CARDCATHFIND:  CONCLUSION:   1. Nonobstructive coronary artery disease with 40% narrowing of the       proximal LAD, no significant obstruction of the circumflex and       right coronary artery.   2. Severe left ventricular dysfunction with global hypokinesis and an       estimated ejection fraction of 20-25% with pulmonary wedge pressure       of 15.      RECOMMENDATIONS:  The patient has severe dilated cardiomyopathy.  We   will plan to followup with Dr. [name] and continue medical management.               Bruce Alfonso Patten Olevia Perches, MD, [facility]     (12/10/2009 16:49)      Cardiac Cath  Procedure date:  12/10/2009  Findings:       CONCLUSION:   1. Nonobstructive coronary artery disease with 40% narrowing of the       proximal LAD, no significant obstruction of the circumflex and       right coronary artery.   2. Severe left ventricular dysfunction with global hypokinesis and an       estimated ejection fraction of 20-25% with pulmonary wedge pressure       of 15.      RECOMMENDATIONS:  The patient has severe dilated cardiomyopathy.  We   will plan to followup with Dr. [name] and continue medical management.               Bruce Alfonso Patten Olevia Perches, MD, [facility]

## 2010-11-04 NOTE — Letter (Signed)
Summary: progress notes  progress notes   Imported By: Nevada Crane 12/09/2009 12:19:43  _____________________________________________________________________  External Attachment:    Type:   Image     Comment:   External Document

## 2010-11-04 NOTE — Assessment & Plan Note (Signed)
Summary: 2 mth f/u per checkout on 07/01/10/tg   Visit Type:  Follow-up Primary Provider:  Dr. Marjean Donna   History of Present Illness: 65 year old woman presents for followup. I saw her back in September. She was referred for further evaluation of cardiomyopathy with chronic systolic heart failure. I referred her to see Dr. Haroldine Laws and she underwent CPX testing as well as right heart catheterization, also followup echocardiogram.  Recent CPX with pVO2 11.1 slope 60 pRER 1.13 OUES 0.65 Ve/MVV 37%. She has low output symptoms, and several options were discussed including even the potential for LVAD or cardiac transplant. She tells me that she does not want to consider these options at all.  Followup labs from 8 November showed sodium 144, potassium 4.3, BUN 24, creatinine 0.5, hemoglobin 11.8, INR 1.0. Medications have been stable.  She was seen by Dr. Caryl Comes to discuss possible CRT-D implantation, and this was recommended, although it does not appear that a final decision was made. She tells me that she has considered the matter, discussed with her family, and would like to proceed with CRT-D. placement.  I talked with her about this some today, and answered her questions. She plans to contact Dr. Olin Pia office soon to make arrangements.  Current Medications (verified): 1)  Aspir-Low 81 Mg Tbec (Aspirin) .... Take 1 Tab Daily 2)  Losartan Potassium 25 Mg Tabs (Losartan Potassium) .... Take 1 in Am and 2 in Pm 3)  Furosemide 20 Mg Tabs (Furosemide) .... Take 1 Tablet By Mouth Prn  For Swelling 4)  Klor-Con 10 10 Meq Cr-Tabs (Potassium Chloride) .... Take 1 Tablet By Mouth As Needed (Take When You Take Furosemide) 5)  Carvedilol 6.25 Mg Tabs (Carvedilol) .... Take 1 Tablet By Mouth Two Times A Day 6)  Nitrostat 0.4 Mg Subl (Nitroglycerin) .... Take As Directed For Chestpain 7)  Lantus 100 Unit/ml Soln (Insulin Glargine) .... 40 Units Daily 8)  Digoxin 0.125 Mg Tabs (Digoxin) .... Take 1  Tablet By Mouth Once Daily 9)  Colace 100 Mg Caps (Docusate Sodium) .... Take 1 Tablet Daily As Needed For Constipation 10)  Aldactone 25 Mg Tabs (Spironolactone) .... Take 1 Tablet By Mouth By Mouth Once Daily 11)  Vitamin D3 1000000 Unit/gm Liqd (Cholecalciferol) .... Take 1 Tab Daily  Allergies (verified): 1)  ! Vicodin  Comments:  Nurse/Medical Assistant: patient reviewed med list from previous ov and stated that all meds are correct didn't bring meds patient drug store is [place]  Past History:  Past Medical History: Last updated: 08/12/2010 Diabetes Type 2 Hypertension CHF due to nonischemic cardiomyopathy, LVEF 20% CAD - nonobstructive  Social History: Last updated: 03/18/2010 Full Time - [employer] [facility] (out of work) Tobacco Use - No Alcohol Use - no 2 children  Review of Systems       The patient complains of dyspnea on exertion.  The patient denies anorexia, fever, weight loss, chest pain, syncope, peripheral edema, abdominal pain, melena, and hematochezia.         Occasional dizziness, no palpitations or syncope. Otherwise reviewed and negative except as outlined.  Vital Signs:  Patient profile:   65 year old female Weight:      134 pounds BMI:     25.41 Pulse rate:   61 / minute BP sitting:   135 / 74  (right arm)  Vitals Entered By: Doretha Sou, CNA (September 01, 2010 9:05 AM)  Physical Exam  Additional Exam:  Normally nourished appearing woman in no acute  distress. HEENT: Conjunctiva and lids normal, oropharynx clear. Neck: Supple, JVP 8 cm water, no carotid bruits, no thyromegaly. Lungs: Clear with diminished breath sounds, nonlabored. Cardiac: Indistinct PMI, regular rate and rhythm, paradoxically split S2, no S3. Abdomen: Soft, nontender, no hepatomegaly, bowel sounds present. Extremities: Trace ankle edema, distal pulses one plus. Skin: Warm and dry.    Echocardiogram  Procedure date:  08/19/2010  Findings:      Study  Conclusions            - Left ventricle: The cavity size was mildly dilated. There was mild       to moderate concentric hypertrophy. Systolic function was severely       reduced. The estimated ejection fraction was in the range of 15%       to 20%.     - Ventricular septum: Septal motion showed paradox.     - Aortic valve: Mildly calcified annulus. Trileaflet; moderately       thickened, moderately calcified leaflets. Cusp separation was       mildly reduced. Trivial, if any aorticstenosis. Valve area:       1.02cm 2(VTI). Valve area: 1.22cm 2 (Vmax).     - Left atrium: The atrium was mildly to moderately dilated.     - Atrial septum: No defect or patent foramen ovale was identified.     - Pulmonary arteries: Systolic pressure was mildly increased. PA       peak pressure: 16mm Hg (S).     - Pericardium, extracardiac: Minimalpericardial effusion.     Impressions:            - Compared to the prior study performed 01/22/10, there has been no       significant interval change.  Cardiac Cath  Procedure date:  08/14/2010  Findings:      DESCRIPTION OF PROCEDURE:  The risks and indication were explained. Consent was signed and placed on the chart.  A 7-French venous sheath was placed in the right femoral vein using modified Seldinger technique. A standard Swan-Ganz catheter was used.  There were no apparent complications.  RA mean of 2, RV 40/4/5, PA 36/10 with a mean of 20, PCWP mean of 6, Fick 3.6/2.3, thermodilution 4.3/2.7.  Pulmonary vascular resistance by Fick 3.9 Woods unit.   SATURATIONS:  Femoral artery 97%.  Pulmonary artery 62% and 63%.  SVC 59% on room air.   ASSESSMENT: 1. Low intracardiac filling pressures. 2. Normal cardiac output and pulmonary artery pressures with mildly     increased pulmonary vascular resistance. 3. No evidence of intracardiac shunt.  Impression & Recommendations:  Problem # 1:  CHRONIC SYSTOLIC HEART FAILURE (123456)  Symptomatically  stable. I reviewed recent evaluations by Dr. Haroldine Laws and Dr. Caryl Comes. Plan at this point to continue medical therapy, and proceed with CRT-D. therapy. She states that she has considered the matter, discussed with her family, and is ready to proceed. No medication changes were made today. She is using her diuretics every other day, and limiting fluid to less than 2 L a day. I will see her back in the next 4-6 weeks. She sees Dr. Haroldine Laws next month.  Her updated medication list for this problem includes:    Aspir-low 81 Mg Tbec (Aspirin) .Marland Kitchen... Take 1 tab daily    Losartan Potassium 25 Mg Tabs (Losartan potassium) .Marland Kitchen... Take 1 in am and 2 in pm    Furosemide 20 Mg Tabs (Furosemide) .Marland Kitchen... Take 1 tablet by mouth prn  for  swelling    Carvedilol 6.25 Mg Tabs (Carvedilol) .Marland Kitchen... Take 1 tablet by mouth two times a day    Nitrostat 0.4 Mg Subl (Nitroglycerin) .Marland Kitchen... Take as directed for chestpain    Digoxin 0.125 Mg Tabs (Digoxin) .Marland Kitchen... Take 1 tablet by mouth once daily    Aldactone 25 Mg Tabs (Spironolactone) .Marland Kitchen... Take 1 tablet by mouth by mouth once daily  Problem # 2:  LEFT BUNDLE BRANCH BLOCK (ICD-426.3)  Chronic.  Her updated medication list for this problem includes:    Aspir-low 81 Mg Tbec (Aspirin) .Marland Kitchen... Take 1 tab daily    Carvedilol 6.25 Mg Tabs (Carvedilol) .Marland Kitchen... Take 1 tablet by mouth two times a day    Nitrostat 0.4 Mg Subl (Nitroglycerin) .Marland Kitchen... Take as directed for chestpain  Problem # 3:  UNSPECIFIED SECONDARY CARDIOMYOPATHY (ICD-425.9)  Nonischemic cardiomyopathy.  Her updated medication list for this problem includes:    Aspir-low 81 Mg Tbec (Aspirin) .Marland Kitchen... Take 1 tab daily    Losartan Potassium 25 Mg Tabs (Losartan potassium) .Marland Kitchen... Take 1 in am and 2 in pm    Furosemide 20 Mg Tabs (Furosemide) .Marland Kitchen... Take 1 tablet by mouth prn  for swelling    Carvedilol 6.25 Mg Tabs (Carvedilol) .Marland Kitchen... Take 1 tablet by mouth two times a day    Nitrostat 0.4 Mg Subl (Nitroglycerin) .Marland Kitchen... Take as  directed for chestpain    Digoxin 0.125 Mg Tabs (Digoxin) .Marland Kitchen... Take 1 tablet by mouth once daily    Aldactone 25 Mg Tabs (Spironolactone) .Marland Kitchen... Take 1 tablet by mouth by mouth once daily  Patient Instructions: 1)  Your physician recommends that you schedule a follow-up appointment in: 4 to 6 weeks 2)  Your physician recommends that you continue on your current medications as directed. Please refer to the Current Medication list given to you today.

## 2010-11-04 NOTE — Miscellaneous (Signed)
Summary: labs bmp,bnp,05/19/2010  Clinical Lists Changes  Observations: Added new observation of CALCIUM: 9.2 mg/dL (05/19/2010 15:59) Added new observation of CREATININE: 0.71 mg/dL (05/19/2010 15:59) Added new observation of BUN: 28 mg/dL (05/19/2010 15:59) Added new observation of BG RANDOM: 200 mg/dL (05/19/2010 15:59) Added new observation of CO2 PLSM/SER: 26 meq/L (05/19/2010 15:59) Added new observation of CL SERUM: 109 meq/L (05/19/2010 15:59) Added new observation of K SERUM: 4.6 meq/L (05/19/2010 15:59) Added new observation of NA: 143 meq/L (05/19/2010 15:59) Added new observation of BNP: 298.2  (05/19/2010 15:59)

## 2010-11-04 NOTE — Letter (Signed)
Summary: labs  labs   Imported By: Nevada Crane 12/09/2009 12:20:04  _____________________________________________________________________  External Attachment:    Type:   Image     Comment:   External Document

## 2010-11-04 NOTE — Assessment & Plan Note (Signed)
Summary: 9am  ec6   Visit Type:  Follow-up Primary Provider:  Dr. Marjean Donna   History of Present Illness: [name] is a 65 y/o woman with advanced HF due to NICM referred by Dr. [name] for consideration of advanced heart failure therapies.  Has h/o DM2 (dx'd 1998) and moderate hypertension. In February presented with LE edema and decreased urine output. Ech with EF 15-20%. RV normal.  Cath 3/11:  1. Nonobstructive coronary artery disease with 40% narrowing of the       proximal LAD, no significant obstruction of the circumflex and       right coronary artery.   2. Severe left ventricular dysfunction with global hypokinesis and an       estimated ejection fraction of 20-25% with pulmonary wedge pressure       of 15.   Recently really struggling with low output sympoms. Can only walk the length of a room before she gives out. Struggles with her ADLs. Condition also complicated by severe orthostasis. Edema managed well by lasix. Only checking weight 1x/week. No palpitations or syncope.   Recent CPX with pVO2 11.1 slope 60 pRER 1.13 OUES 0.65 Ve/MVV 37%  Current Medications (verified): 1)  Aspir-Low 81 Mg Tbec (Aspirin) .... Take 1 Tab Daily 2)  Losartan Potassium 25 Mg Tabs (Losartan Potassium) .... Take 1 in Am and 2 in Pm 3)  Furosemide 20 Mg Tabs (Furosemide) .... Take 1 Tablet By Mouth Prn  For Swelling 4)  Klor-Con 10 10 Meq Cr-Tabs (Potassium Chloride) .... Take 1 Tablet By Mouth As Needed (Take When You Take Furosemide) 5)  Carvedilol 6.25 Mg Tabs (Carvedilol) .... Take 1 Tablet By Mouth Two Times A Day 6)  Nitrostat 0.4 Mg Subl (Nitroglycerin) .... Take As Directed For Chestpain 7)  Lantus 100 Unit/ml Soln (Insulin Glargine) .... 40 Units Daily 8)  Digoxin 0.125 Mg Tabs (Digoxin) .... Take 1 Tablet By Mouth Once Daily 9)  Colace 100 Mg Caps (Docusate Sodium) .... Take 1 Tablet Daily As Needed For Constipation 10)  Aldactone 25 Mg Tabs (Spironolactone) .... Take 1 Tablet By  Mouth By Mouth Once Daily 11)  Vitamin D3 1000000 Unit/gm Liqd (Cholecalciferol) .... Take 1 Tab Daily  Allergies: 1)  ! Vicodin  Past History:  Past Medical History: Diabetes Type 2 Hypertension CHF due to nonischemic cardiomyopathy, LVEF 20% CAD - nonobstructive  Family History: Reviewed history from 12/05/2009 and no changes required. Father: died in his 61s with cirrhosis Mother: died age 32 with ovarian cancer  Social History: Reviewed history from 03/18/2010 and no changes required. Full Time - [employer] [facility] (out of work) Tobacco Use - No Alcohol Use - no 2 children  Review of Systems       As per HPI and past medical history; otherwise all systems negative.   Vital Signs:  Patient profile:   65 year old female Height:      61 inches Weight:      134 pounds BMI:     25.41 Pulse rate:   61 / minute BP sitting:   192 / 88  (left arm)  Vitals Entered By: Margaretmary Bayley CMA (August 12, 2010 9:25 AM)  Physical Exam  General:  No acute distress.  no resp difficulty HEENT: normal Neck: supple. no JVD. Carotids 2+ bilat; no bruits. No lymphadenopathy or thryomegaly appreciated. Cor: PMI laterally displaced. Regular rate & rhythm. 2/6 systolic murmur at RSB. +s3 Lungs: clear Abdomen: soft, nontender, nondistended. No hepatosplenomegaly. No  bruits or masses. Good bowel sounds. Extremities: no cyanosis, clubbing, rash, edema Neuro: alert & orientedx3, cranial nerves grossly intact. moves all 4 extremities w/o difficulty. affect pleasant    Impression & Recommendations:  Problem # 1:  CHRONIC SYSTOLIC HEART FAILURE (123456) [name] has severe HF due to a NICM. Currently NYHA IIIb confirmed by CPX which had several high-risk fatures. No signifcant improvement despite 9 months of meidcal therapy. Had a long frank discussion with her and her daughter about the situation and I told them that I think her best option would be advanced therapies such as an  LVAD or transplant. However, she is quite reluctant about proceeding with these particularly due to the financial strain it will place on her family. Given her wide LBBB, I do think it is reasonable to consider starting with BiVICD though I am concerned that it may not be sufficient at this stage in disease. Will proceed with repeat echo and RHC to further evaluate. Refer to EP for eval. Will follow closely. Medication titration limited at this point due to orthostasis.   Other Orders: Echocardiogram (Echo) EP Referral (Cardiology EP Ref ) TLB-BMP (Basic Metabolic Panel-BMET) (99991111) TLB-CBC Platelet - w/Differential (85025-CBCD) TLB-PT (Protime) (85610-PTP) Cardiac Catheterization (Cardiac Cath)  Patient Instructions: 1)  Labs today 2)  Your physician has requested that you have an echocardiogram.  Echocardiography is a painless test that uses sound waves to create images of your heart. It provides your doctor with information about the size and shape of your heart and how well your heart's chambers and valves are working.  This procedure takes approximately one hour. There are no restrictions for this procedure. 3)  You have been referred to Dr Caryl Comes 4)  Cath scheduled for 11/10 please see instruction sheet given to you today.

## 2010-11-04 NOTE — Letter (Signed)
Summary: Implantable Device Instructions  Press photographer, [place]  [id number] [place]   [place], [place] [address]   Phone: [phone]  Fax: [phone]      Implantable Device Instructions  You are scheduled for:  ___x__ Permanent Transvenous Pacemaker/Defibrillator (CRT-D)  on October 01, 2010 at 12:30 pm with Dr. Caryl Comes.  1.  Please arrive at the [place] [facility] at 10:30 am on the day of your procedure.  2.  You may have a clear liquid breakfast before 6:30 am the morning of your procedure. Clear liquids include water, broth, Ginger Ale, Sprite, cranberry/apple/grape juice, tea (no sugar), and popsicles from clear juices.  3.  Complete lab work on September 24, 2010 at 10:00 am.  The lab at [address] is open from 8:30 AM to 1:30 PM and from 2:30 PM to 5:00 PM.  You do not have to be fasting.  4.  Do NOT take Furosemide and Lantus the morning of your procedure.  If you take Lantus at night, take half of the prescribed dose.   5.  Plan for an overnight stay.  Bring your insurance cards and a list of your medications.  6.  Wash your chest and neck with antibacterial soap (any brand) the evening before and the morning of your procedure.  Rinse well.   *If you have ANY questions after you get home, please call the office [phone]. Joan Flores, RN  *Every attempt is made to prevent procedures from being rescheduled.  Due to the nauture of Electrophysiology, rescheduling can happen.  The physician is always aware and directs the staff when this occurs.

## 2010-11-04 NOTE — Cardiovascular Report (Signed)
Summary: Pre Cath Orders   Pre Cath Orders   Imported By: Sallee Provencal 08/25/2010 16:38:16  _____________________________________________________________________  External Attachment:    Type:   Image     Comment:   External Document

## 2010-11-04 NOTE — Progress Notes (Signed)
  Echo,LOV faxed over to [facility] to [place]  January 22, 2010 1:40 PM

## 2010-11-04 NOTE — Letter (Signed)
Summary: Cardiac Catheterization Instructions- [facility] Lab  [facility] [place]. [address], [place] [address]   Phone: [phone]  Fax: [phone]     12/05/2009 MRN: [id number]  [name] [place], [place]  [id number]  Dear [name],   You are scheduled for a Cardiac Catheterization on _________ with Dr.______________  Please arrive to the 1st floor of the Heart and Vascular Center at [facility] at _____ am / pm on the day of your procedure. Please do not arrive before 6:30 a.m. Call the Heart and Vascular Center at [phone] if you are unable to make your appointmnet. The Code to get into the parking garage under the building is________. Take the elevators to the 1st floor. You must have someone to drive you home. Someone must be with you for the first 24 hours after you arrive home. Please wear clothes that are easy to get on and off and wear slip-on shoes. Do not eat or drink after midnight except water with your medications that morning. Bring all your medications and current insurance cards with you.  ___ DO NOT take these medications before your procedure: ________________________________________________________________  ___ Make sure you take your aspirin.  ___ You may take ALL of your medications with water that morning. ________________________________________________________________________________________________________________________________  ___ DO NOT take ANY medications before your procedure.  ___ Pre-med instructions:  ________________________________________________________________________________________________________________________________  The usual length of stay after your procedure is 2 to 3 hours. This can vary.  If you have any questions, please call the office at the number listed above.   Jeani Hawking Via LPN

## 2010-11-04 NOTE — Assessment & Plan Note (Signed)
Summary: 4 wk f/u per checkout on 03/18/10/tg   Visit Type:  Follow-up Primary Provider:  Dr. Marjean Donna   History of Present Illness: 65 year old woman presents for followup. She states that she's been doing "okay." She has occasional chest pain, not typically exertional, and has NYHA class 2-3 dyspnea and exertion. No reported orthopnea or PND. She has occasional leg tightness/edema, although not progressive. Her weight has been stable.  She does mention that she is having problems with insomnia. She had a neurological assessment with [facility] neurology recently. Available information indicates an element of diabetic peripheral neuropathy. She does still have some postural dizziness, no palpitations or syncope however. She does manifest some orthostatic change in blood pressure as already noted previously.  Followup labs from 30 June showed sodium 145, potassium 4.3, BUN 24, creatinine 0.6. We reviewed these today.  Today I discussed the general pathophysiology of a nonischemic cardiomyopathy, importance of medical therapy, and followup over time to assess for improvement. We also touched on the possibility of considering further evaluation for device therapy, perhaps even resynchronization therapy.  Current Medications (verified): 1)  Aspir-Low 81 Mg Tbec (Aspirin) .... Take 1 Tab Daily 2)  Losartan Potassium 25 Mg Tabs (Losartan Potassium) .... Take 1 in Am and 2 in Pm 3)  Furosemide 20 Mg Tabs (Furosemide) .... Take 1 Tablet By Mouth Prn  For Swelling 4)  Klor-Con 10 10 Meq Cr-Tabs (Potassium Chloride) .... Take 1 Tablet By Mouth As Needed (Take When You Take Furosemide) 5)  Carvedilol 6.25 Mg Tabs (Carvedilol) .... Take 1 Tablet By Mouth Two Times A Day 6)  Nitrostat 0.4 Mg Subl (Nitroglycerin) .... Take As Directed For Chestpain 7)  Lantus 100 Unit/ml Soln (Insulin Glargine) .... 40 Units Daily 8)  Digoxin 0.125 Mg Tabs (Digoxin) .... Take 1 Tablet By Mouth Once Daily 9)  Colace 100  Mg Caps (Docusate Sodium) .... Take 1 Tablet Daily As Needed For Constipation 10)  Aldactone 25 Mg Tabs (Spironolactone) .... Take 1 Tablet By Mouth By Mouth Once Daily  Allergies (verified): 1)  ! Vicodin  Past History:  Social History: Last updated: 03/18/2010 Full Time - [facility] (out of work) Tobacco Use - No Alcohol Use - no 2 children  Past Medical History: Diabetes Type 2 Hypertension Nonischemic cardiomyopathy, LVEF 20% CAD - nonobstructive  Review of Systems       The patient complains of chest pain and dyspnea on exertion.  The patient denies anorexia, fever, weight gain, syncope, peripheral edema, prolonged cough, abdominal pain, melena, and hematochezia.         Otherwise reviewed and negative except as outlined above.  Vital Signs:  Patient profile:   65 year old female Height:      61 inches Weight:      133 pounds O2 Sat:      98 % on Room air Temp:     99.3 degrees F oral Pulse rate:   69 / minute BP sitting:   150 / 70  (left arm)  Vitals Entered By: Tye Savoy RN (May 01, 2010 1:31 PM)  O2 Flow:  Room air  Clinical Review Panels:  Echocardiogram Echocardiogram Study Conclusions    - Left ventricle: The cavity size was mildly dilated. Wall thickness     was increased in a pattern of moderate LVH. Systolic function was     severely reduced. The estimated ejection fraction was in the range     of 15% to 20%. Doppler parameters  are consistent with abnormal     left ventricular relaxation (grade 1 diastolic dysfunction).   - Ventricular septum: Septal motion showed abnormal function and     dyssynergy.   - Aortic valve: Mildly calcified annulus. Trileaflet; mildly     calcified leaflets. Trivial regurgitation. Peak gradient: 8mm Hg     (S).   - Mitral valve: Mild regurgitation.   - Left atrium: The atrium was mildly dilated.   - Right ventricle: Systolic function was normal.   - Tricuspid valve: Mild regurgitation.   - Pulmonary  arteries: PA peak pressure: 78mm Hg (S).   - Pericardium, extracardiac: A small pericardial effusion was     identified. (01/22/2010)  Cardiac Imaging Cardiac Cath Findings  CONCLUSION:   1. Nonobstructive coronary artery disease with 40% narrowing of the       proximal LAD, no significant obstruction of the circumflex and       right coronary artery.   2. Severe left ventricular dysfunction with global hypokinesis and an       estimated ejection fraction of 20-25% with pulmonary wedge pressure       of 15.      RECOMMENDATIONS:  The patient has severe dilated cardiomyopathy.  We   will plan to followup with Dr. [name] and continue medical management.               Bruce Alfonso Patten Olevia Perches, MD, [facility]     (12/10/2009)    Physical Exam  Additional Exam:  Normally nourished appearing woman in no acute distress. HEENT: Conjunctiva and lids normal, oropharynx clear. Neck: Supple, JVP 8 cm water, no carotid bruits, no thyromegaly. Lungs: Clear with diminished breath sounds, nonlabored. Cardiac: Indistinct PMI, regular rate and rhythm, paradoxically split S2, no S3. Abdomen: Soft, nontender, no hepatomegaly, bowel sounds present. Extremities: Trace ankle edema, distal pulses one plus. Skin: Warm and dry. Musculoskeletal: No kyphosis. Neuropsychiatric: Alert and oriented x3, affect appropriate.    Impression & Recommendations:  Problem # 1:  CHRONIC SYSTOLIC HEART FAILURE (123456)  Symptoms and weight have been stable since last visit. We discussed medication adjustments today, and we will add Aldactone 25 mg daily, using Lasix and potassium supplements only as needed for increase in weight or exacerbation of lower extremity edema. Followup BMET and BNP will be added for next visit in 3 or 4 weeks.  Her updated medication list for this problem includes:    Aspir-low 81 Mg Tbec (Aspirin) .Marland Kitchen... Take 1 tab daily    Losartan Potassium 25 Mg Tabs (Losartan potassium) .Marland Kitchen... Take 1 in am  and 2 in pm    Furosemide 20 Mg Tabs (Furosemide) .Marland Kitchen... Take 1 tablet by mouth once daily    Carvedilol 6.25 Mg Tabs (Carvedilol) .Marland Kitchen... Take 1 tablet by mouth two times a day    Nitrostat 0.4 Mg Subl (Nitroglycerin) .Marland Kitchen... Take as directed for chestpain    Digoxin 0.125 Mg Tabs (Digoxin) .Marland Kitchen... Take 1 tablet by mouth once daily  Problem # 2:  CORONARY ATHEROSCLEROSIS NATIVE CORONARY ARTERY (ICD-414.01)  Nonobstructive at cardiac catheterization back in March.  Her updated medication list for this problem includes:    Aspir-low 81 Mg Tbec (Aspirin) .Marland Kitchen... Take 1 tab daily    Carvedilol 6.25 Mg Tabs (Carvedilol) .Marland Kitchen... Take 1 tablet by mouth two times a day    Nitrostat 0.4 Mg Subl (Nitroglycerin) .Marland Kitchen... Take as directed for chestpain  Problem # 3:  ESSENTIAL HYPERTENSION, BENIGN (ICD-401.1)  Blood pressure elevated today. Optimal  management has been somewhat complicated by orthostatic changes as well. Will continue to follow and make adjustments as able.  Her updated medication list for this problem includes:    Aspir-low 81 Mg Tbec (Aspirin) .Marland Kitchen... Take 1 tab daily    Losartan Potassium 25 Mg Tabs (Losartan potassium) .Marland Kitchen... Take 1 in am and 2 in pm    Furosemide 20 Mg Tabs (Furosemide) .Marland Kitchen... Take 1 tablet by mouth prn  for swelling    Carvedilol 6.25 Mg Tabs (Carvedilol) .Marland Kitchen... Take 1 tablet by mouth two times a day    Aldactone 25 Mg Tabs (Spironolactone) .Marland Kitchen... Take 1 tablet by mouth by mouth once daily  Future Orders: T-Basic Metabolic Panel ([id number]) ... 05/19/2010  Other Orders: Future Orders: T-BNP  (B Natriuretic Peptide) [id number]) ... 05/19/2010  Patient Instructions: 1)  Your physician recommends that you schedule a follow-up appointment in: 3 to 4 weeks  2)  Your physician has recommended you make the following change in your medication: Start taking Aldactone 25mg  by mouth once daily and only take Furosemide (Lasix) and Klor Con as needed for  swelling. Prescriptions: ALDACTONE 25 MG TABS (SPIRONOLACTONE) take 1 tablet by mouth by mouth once daily  #30 x 3   Entered by:   Jeani Hawking Via LPN   Authorized by:   Beckie Salts, MD, [facility]   Signed by:   Jeani Hawking Via LPN on [id number]   Method used:   Electronically to        [facility] (retail)       [place] [address] [place], [place]  [address]       Ph: [phone]       Fax: [phone]   RxID:   [id number]

## 2010-11-04 NOTE — Letter (Signed)
Summary: [facility] at [facility]. [address] [place] [address]   Phone: [phone]  Fax: [phone]     January 21, 2010    [name]   The above named patient had a medical visit today at: 1:00 pm.  Please take this into consideration when reviewing the time away from work/school.      Sincerely yours,   Press photographer

## 2010-11-04 NOTE — Letter (Signed)
Summary: [facility], general practice at [place]. [address], [place] [address]   Phone: [phone]  Fax: [phone]      April 04, 2010 MRN: [id number]   [place], [place]  [address]   Dear Ms. Schimek,  Your test ordered by Rande Lawman has been reviewed by your physician (or physician assistant) and was found to be normal or stable. Your physician (or physician assistant) felt no changes were needed at this time.  ____ Echocardiogram  ____ Cardiac Stress Test  __X__ Lab Work  ____ Peripheral vascular study of arms, legs or neck  ____ CT scan or X-ray  ____ Lung or Breathing test  ____ Other: Please continue on current medical treatment.  Thank you.   Rozann Lesches, MD, F.A.C.C

## 2010-11-04 NOTE — Letter (Signed)
Summary: [facility], general practice at [place]. [address], [place] [address]   Phone: [phone]  Fax: [phone]      June 25, 2010 MRN: [id number]   [name] [address] [place], [place]  [address]   Dear [name],  Your test ordered by Rande Lawman has been reviewed by your physician (or physician assistant) and was found to be normal or stable. Your physician (or physician assistant) felt no changes were needed at this time.  ____ Echocardiogram  ____ Cardiac Stress Test  __X__ Lab Work  ____ Peripheral vascular study of arms, legs or neck  ____ CT scan or X-ray  ____ Lung or Breathing test  ____ Other:  Please continue on current medical treatment.  Thank you.   Rozann Lesches, MD, F.A.C.C

## 2010-11-04 NOTE — Miscellaneous (Signed)
Summary: labs bmp,11/21/2009  Clinical Lists Changes  Observations: Added new observation of CALCIUM: 8.8 mg/dL (11/21/2009 8:44) Added new observation of CREATININE: 0.95 mg/dL (11/21/2009 8:44) Added new observation of BUN: 32 mg/dL (11/21/2009 8:44) Added new observation of BG RANDOM: 313 mg/dL (11/21/2009 8:44) Added new observation of CO2 PLSM/SER: 28 meq/L (11/21/2009 8:44) Added new observation of CL SERUM: 98 meq/L (11/21/2009 8:44) Added new observation of K SERUM: 4.4 meq/L (11/21/2009 8:44) Added new observation of NA: 138 meq/L (11/21/2009 8:44) Added new observation of BNP: 198.7  (11/21/2009 8:44)

## 2010-11-04 NOTE — Letter (Signed)
Summary: chest x ray  chest x ray   Imported By: [place] Crane 12/09/2009 12:20:34  _____________________________________________________________________  External Attachment:    Type:   Image     Comment:   External Document

## 2010-11-04 NOTE — Letter (Signed)
Summary: [facility], general practice at [place]. [address] [place] [address]   Phone: [phone]  Fax: [phone]      May 26, 2010 MRN: [id number]   [address] [place], [place]  [address]   Dear Ms. Mcray,  Your test ordered by Rande Lawman has been reviewed by your physician (or physician assistant) and was found to be normal or stable. Your physician (or physician assistant) felt no changes were needed at this time.  ____ Echocardiogram  ____ Cardiac Stress Test  __X__ Lab Work  ____ Peripheral vascular study of arms, legs or neck  ____ CT scan or X-ray  ____ Lung or Breathing test  ____ Other:   Thank you.   Rozann Lesches, MD, F.A.C.C

## 2010-11-04 NOTE — Assessment & Plan Note (Signed)
Summary: eval for Bi-V ICD implant/chronic systolic HF/lg   Visit Type:  rov Primary Caulin Begley:  Dr. Marjean Donna  CC:  chest discomfort...sob...denies any edema.  History of Present Illness: [name] is seen at the request of Dr. Reine Just and Dr. [name] for consideration of CRT implantation.  She is a 65 y/o woman with advanced HF due to NICM.2 walk about 100+ feet on a flat although she gives out quite easily. She denies peripheral edema nocturnal dyspnea orthopnea.  She has had one episode of significant tachycardia palpitations associated with lightheadedness.   In February presented with LE edema and decreased urine output. Ech with EF 15-20%. RV normal. Cath 3/11:  1. Nonobstructive coronary artery disease with 40% narrowing of the       proximal LAD, no significant obstruction of the circumflex and       right coronary artery.   2. Severe left ventricular dysfunction with global hypokinesis and an       estimated ejection fraction of 20-25% with pulmonary wedge pressure       of 15.   Recent CPX with pVO2 11.1 slope 60 pRER 1.13 OUES 0.65 Ve/MVV 37%;    she also underwent recent catheterization-right demonstrating  1. Low intracardiac filling pressures. 2. Normal cardiac output and pulmonary artery pressures with mildly     increased pulmonary vascular resistance. 3. No evidence of intracardiac shunt.    Has h/o DM2 (dx'd 1998) and moderate hypertension.  Current Medications (verified): 1)  Aspir-Low 81 Mg Tbec (Aspirin) .... Take 1 Tab Daily 2)  Losartan Potassium 25 Mg Tabs (Losartan Potassium) .... Take 1 in Am and 2 in Pm 3)  Furosemide 20 Mg Tabs (Furosemide) .... Take 1 Tablet By Mouth Prn  For Swelling 4)  Klor-Con 10 10 Meq Cr-Tabs (Potassium Chloride) .... Take 1 Tablet By Mouth As Needed (Take When You Take Furosemide) 5)  Carvedilol 6.25 Mg Tabs (Carvedilol) .... Take 1 Tablet By Mouth Two Times A Day 6)  Nitrostat 0.4 Mg Subl (Nitroglycerin) .... Take As  Directed For Chestpain 7)  Lantus 100 Unit/ml Soln (Insulin Glargine) .... 40 Units Daily 8)  Digoxin 0.125 Mg Tabs (Digoxin) .... Take 1 Tablet By Mouth Once Daily 9)  Colace 100 Mg Caps (Docusate Sodium) .... Take 1 Tablet Daily As Needed For Constipation 10)  Aldactone 25 Mg Tabs (Spironolactone) .... Take 1 Tablet By Mouth By Mouth Once Daily 11)  Vitamin D3 1000000 Unit/gm Liqd (Cholecalciferol) .... Take 1 Tab Daily  Allergies: 1)  ! Vicodin  Past History:  Past Medical History: Last updated: 08/12/2010 Diabetes Type 2 Hypertension CHF due to nonischemic cardiomyopathy, LVEF 20% CAD - nonobstructive  Past Surgical History: Last updated: 12/30/09 Hysterectomy Left shoulder surgery Lumpectomy  Family History: Last updated: 2009/12/30 Father: died in his 47s with cirrhosis Mother: died age 74 with ovarian cancer  Social History: Last updated: 03/18/2010 Full Time - [employer] [facility] (out of work) Tobacco Use - No Alcohol Use - no 2 children  Vital Signs:  Patient profile:   65 year old female Height:      61 inches Weight:      134 pounds BMI:     25.41 Pulse rate:   62 / minute Pulse rhythm:   irregular BP sitting:   146 / 80  (left arm) Cuff size:   regular  Vitals Entered By: Julaine Hua, CMA (August 27, 2010 1:20 PM)  Physical Exam  General:  Well developed, well nourished, older  African American female appearing her stated agein no acute distress. Head:  normal HEENT Neck:  supple without thyromegaly; flat neck veins carotid brisk and full without bruits Chest Wall:  without kyphosis or scoliosis Lungs:  clear to auscultation Heart:  regular rate and rhythm without murmurs or gallops; + displaced PMI Abdomen:  soft nontender without hepatomegaly there is a prominent but narrow midline pulsation Msk:  without obvious skeletal deformity Pulses:  intact distal pulses Extremities:  without clubbing cyanosis or edema Neurologic:  alert and  oriented and grossly normal motor and sensory function Skin:  warm and dry Cervical Nodes:  without adenopathy Psych:  engaging affect   EKG  Procedure date:  07/01/2010  Findings:      sinus rhythm at 65 Intervals 0.17/0.16/0.48 Axis is -31 Left bundle-branch block  EKG  Procedure date:  08/27/2010  Findings:      nearly identical  Impression & Recommendations:  Problem # 1:  CHRONIC SYSTOLIC HEART FAILURE (123456) the patient has chronic systolic heart failure despite maximal    medical therapy.It is appropriate to consider CRT implantation.  Woman with NICM and LAD are a group at high likelihood foir  benefit with CRT.  We have reviewed physiology potential benefits and risks incl but not limited to death perforation inapprorpiate shock diaghragnmtic stimulation infection  she is to see Dr [name] next week and we will wait to see the up shot of their discussion to see whether Dr Karle Starch would like to proceed with CRT_D implantation Her updated medication list for this problem includes:    Aspir-low 81 Mg Tbec (Aspirin) .Marland Kitchen... Take 1 tab daily    Losartan Potassium 25 Mg Tabs (Losartan potassium) .Marland Kitchen... Take 1 in am and 2 in pm    Furosemide 20 Mg Tabs (Furosemide) .Marland Kitchen... Take 1 tablet by mouth prn  for swelling    Carvedilol 6.25 Mg Tabs (Carvedilol) .Marland Kitchen... Take 1 tablet by mouth two times a day    Nitrostat 0.4 Mg Subl (Nitroglycerin) .Marland Kitchen... Take as directed for chestpain    Digoxin 0.125 Mg Tabs (Digoxin) .Marland Kitchen... Take 1 tablet by mouth once daily    Aldactone 25 Mg Tabs (Spironolactone) .Marland Kitchen... Take 1 tablet by mouth by mouth once daily  Problem # 2:  LEFT BUNDLE BRANCH BLOCK (ICD-426.3) as above Her updated medication list for this problem includes:    Aspir-low 81 Mg Tbec (Aspirin) .Marland Kitchen... Take 1 tab daily    Carvedilol 6.25 Mg Tabs (Carvedilol) .Marland Kitchen... Take 1 tablet by mouth two times a day    Nitrostat 0.4 Mg Subl (Nitroglycerin) .Marland Kitchen... Take as directed for chestpain  Problem  # 3:  UNSPECIFIED SECONDARY CARDIOMYOPATHY (ICD-425.9) on med rx x 8 month w/o resolution despite   Patient Instructions: 1)  Your physician recommends that you continue on your current medications as directed. Please refer to the Current Medication list given to you today. 2)  Please call the office when you are ready to schedule CRT-D implant with Dr. Caryl Comes.

## 2010-11-04 NOTE — Letter (Signed)
Summary: Cardiac Catheterization Instructions- [place], [place]  [id number] [place]   [place], [place] [address]   Phone: [phone]  Fax: [phone]     08/12/2010 MRN: [id number]  [name] [name],   [address]  Dear [name],   You are scheduled for a Cardiac Catheterization on Thursday 08/14/10 with Dr. Haroldine Laws  Please arrive to the 1st floor of the Heart and Vascular Center at [facility] at 6:30 am / pm on the day of your procedure. Please do not arrive before 6:30 a.m. Call the Heart and Vascular Center at [phone] if you are unable to make your appointmnet. The Code to get into the parking garage under the building is 2000. Take the elevators to the 1st floor. You must have someone to drive you home. Someone must be with you for the first 24 hours after you arrive home. Please wear clothes that are easy to get on and off and wear slip-on shoes. Do not eat or drink after midnight except water with your medications that morning. Bring all your medications and current insurance cards with you.  _X__ DO NOT take these medications before your procedure: ______Furosemide, aldactone, and Lantus AM of test   ___ Make sure you take your aspirin.  ___ You may take ALL of your medications with water that morning. ________________________________________________________________________________________________________________________________  ___ DO NOT take ANY medications before your procedure.  ___ Pre-med instructions:  ________________________________________________________________________________________________________________________________  The usual length of stay after your procedure is 2 to 3 hours. This can vary.  If you have any questions, please call the office at the number listed above.   Kevan Rosebush, RN

## 2010-11-06 NOTE — Assessment & Plan Note (Signed)
Summary: ROV   Visit Type:  Follow-up Primary Provider:  Dr. Marjean Donna  CC:  NO CARDIOLOGY COMPLAINTS.  History of Present Illness: [name] is a 65 y/o AAF we are seeing on follow-up for recent hospitalization to have AICD placed done on Oct 01, 2010.  This was completed in the setting of nonischemic cardiomyopathy with EF of 20%.  The pacemaker is a BIV ICD (Medtronic). She was symptomatic with frequent episodes of dizziness and profound fatigue.   Since discharge she has been feeling much better, less fatigued and having very little episodes of dizziness.  She remains sore over pacemaker site.  She did fall, tripped in kitchen, but sustained no injury to pacemaker site.   ICD was checked today by Nevin Bloodgood RN in the office please see separate note for details.  Recent labs were completed on the 10th of Jan. All were found to be WNL.  Current Medications (verified): 1)  Aspir-Low 81 Mg Tbec (Aspirin) .... Take 1 Tab Daily 2)  Losartan Potassium 25 Mg Tabs (Losartan Potassium) .... Take 1 in Am and 2 in Pm 3)  Furosemide 20 Mg Tabs (Furosemide) .... Take 1 Tablet By Mouth Prn  For Swelling 4)  Klor-Con 10 10 Meq Cr-Tabs (Potassium Chloride) .... Take 1 Tablet By Mouth As Needed (Take When You Take Furosemide) 5)  Carvedilol 12.5 Mg Tabs (Carvedilol) .... Take 1 Tab Two Times A Day 6)  Nitrostat 0.4 Mg Subl (Nitroglycerin) .... Take As Directed For Chestpain 7)  Lantus 100 Unit/ml Soln (Insulin Glargine) .... 40 Units Daily 8)  Digoxin 0.125 Mg Tabs (Digoxin) .... Take 1 Tablet By Mouth Once Daily 9)  Colace 100 Mg Caps (Docusate Sodium) .... Take 1 Tablet Daily As Needed For Constipation 10)  Aldactone 25 Mg Tabs (Spironolactone) .... Take 1 Tablet By Mouth By Mouth Once Daily 11)  Vitamin D3 1000000 Unit/gm Liqd (Cholecalciferol) .... Take 1 Tab Daily  Allergies (verified): 1)  ! Vicodin  Comments:  Nurse/Medical Assistant: patient brought med list the only change is  carvedilol 12.5 mg bid  Past History:  Past medical history reviewed for relevance to current acute and chronic problems.  Past Medical History: Reviewed history from 08/12/2010 and no changes required. Diabetes Type 2 Hypertension CHF due to nonischemic cardiomyopathy, LVEF 20% CAD - nonobstructive  Review of Systems       All other systems have been reviewed and are negative unless stated above.   Vital Signs:  Patient profile:   65 year old female Weight:      139 pounds BMI:     26.36 O2 Sat:      96 % on Room air Pulse rate:   61 / minute BP sitting:   129 / 61  (left arm)  Vitals Entered By: Sharon Sou, CNA (October 17, 2010 11:13 AM)  O2 Flow:  Room air  Physical Exam  General:  Well developed, well nourished, in no acute distress. Head:  normocephalic and atraumatic Eyes:  PERRLA/EOM intact; conjunctiva and lids normal. Chest Wall:  ICD pacemaker site is well healed without evidence of infection or hematoma Lungs:  Clear bilaterally to auscultation and percussion. Heart:  Non-displaced PMI, chest non-tender; regular rate and rhythm, S1, S2 without murmurs, rubs or gallops. Carotid upstroke normal, no bruit. Normal abdominal aortic size, no bruits. Femorals normal pulses, no bruits. Pedals normal pulses. No edema, no varicosities. Abdomen:  Bowel sounds positive; abdomen soft and non-tender without masses, organomegaly, or hernias noted. No  hepatosplenomegaly. Msk:  Back normal, normal gait. Muscle strength and tone normal. Pulses:  pulses normal in all 4 extremities Extremities:  No clubbing or cyanosis. Neurologic:  Alert and oriented x 3. Psych:  Normal affect.    ICD Specifications Following MD:  Virl Axe, MD     ICD Vendor:  Medtronic     ICD Model Number:  [id number]     ICD Serial Number:  [id number] ICD DOI:  10/01/2010     ICD Implanting MD:  Virl Axe, MD  Lead 1:    Location: RA     DOI: 10/01/2010     Model #: [id number]     Serial #: [id number]      Status: active Lead 2:    Location: RV     DOI: 10/01/2010     Model #: [id number]     Serial #: [id number]     Status: active Lead 3:    Location: LV     DOI: 10/01/2010     Model #: [id number]     Serial #[id number]     Status: active  Indications::  NICM   Impression & Recommendations:  Problem # 1:  CHRONIC SYSTOLIC HEART FAILURE (123456) She is compensated at this time without evidence for fluid overload.  She is feeling mucn better since ICD placement and level of energy is improved. She states the dizziness is all but gone away. She is weighing herself daily. She is concerned that her weight at home is lower than her weight at the office. I have advised her to go by her home scale as she weighs daily and can see if wt gain is occuring based upon her own daily wts. She is also encouraged to use digital scale for subtle changes. Her updated medication list for this problem includes:    Aspir-low 81 Mg Tbec (Aspirin) .Marland Kitchen... Take 1 tab daily    Losartan Potassium 25 Mg Tabs (Losartan potassium) .Marland Kitchen... Take 1 in am and 2 in pm    Furosemide 20 Mg Tabs (Furosemide) .Marland Kitchen... Take 1 tablet by mouth prn  for swelling    Carvedilol 12.5 Mg Tabs (Carvedilol) .Marland Kitchen... Take 1 tab two times a day    Nitrostat 0.4 Mg Subl (Nitroglycerin) .Marland Kitchen... Take as directed for chestpain    Digoxin 0.125 Mg Tabs (Digoxin) .Marland Kitchen... Take 1 tablet by mouth once daily    Aldactone 25 Mg Tabs (Spironolactone) .Marland Kitchen... Take 1 tablet by mouth by mouth once daily  Problem # 2:  ESSENTIAL HYPERTENSION, BENIGN (ICD-401.1) Assessment: Improved  Her updated medication list for this problem includes:    Aspir-low 81 Mg Tbec (Aspirin) .Marland Kitchen... Take 1 tab daily    Losartan Potassium 25 Mg Tabs (Losartan potassium) .Marland Kitchen... Take 1 in am and 2 in pm    Furosemide 20 Mg Tabs (Furosemide) .Marland Kitchen... Take 1 tablet by mouth prn  for swelling    Carvedilol 12.5 Mg Tabs (Carvedilol) .Marland Kitchen... Take 1 tab two times a day    Aldactone 25 Mg Tabs (Spironolactone)  .Marland Kitchen... Take 1 tablet by mouth by mouth once daily  Patient Instructions: 1)  Your physician recommends that you schedule a follow-up appointment in: 6 months Prescriptions: DIGOXIN 0.125 MG TABS (DIGOXIN) take 1 tablet by mouth once daily  #30 x 6   Entered by:   Tye Savoy RN   Authorized by:   Jory Sims, NP   Signed by:   Tye Savoy RN on 10/17/2010   Method used:  Electronically to        [facility] (retail)       [place] [address]       [place], [place]  [address]       Ph: [phone]       Fax: [phone]   RxID:   [id number] CARVEDILOL 12.5 MG TABS (CARVEDILOL) take 1 tab two times a day  #60 x 6   Entered by:   Tye Savoy RN   Authorized by:   Jory Sims, NP   Signed by:   Tye Savoy RN on 10/17/2010   Method used:   Electronically to        [facility] (retail)       [place] [address]       [place], [place]  [address]       Ph: [phone]       Fax: [phone]   RxID:   [id number]

## 2010-11-06 NOTE — Procedures (Signed)
Summary: wch/tmj   Current Medications (verified): 1)  Aspir-Low 81 Mg Tbec (Aspirin) .... Take 1 Tab Daily 2)  Losartan Potassium 25 Mg Tabs (Losartan Potassium) .... Take 1 in Am and 2 in Pm 3)  Furosemide 20 Mg Tabs (Furosemide) .... Take 1 Tablet By Mouth Prn  For Swelling 4)  Klor-Con 10 10 Meq Cr-Tabs (Potassium Chloride) .... Take 1 Tablet By Mouth As Needed (Take When You Take Furosemide) 5)  Carvedilol 6.25 Mg Tabs (Carvedilol) .... Take 1 Tablet By Mouth Two Times A Day 6)  Nitrostat 0.4 Mg Subl (Nitroglycerin) .... Take As Directed For Chestpain 7)  Lantus 100 Unit/ml Soln (Insulin Glargine) .... 40 Units Daily 8)  Digoxin 0.125 Mg Tabs (Digoxin) .... Take 1 Tablet By Mouth Once Daily 9)  Colace 100 Mg Caps (Docusate Sodium) .... Take 1 Tablet Daily As Needed For Constipation 10)  Aldactone 25 Mg Tabs (Spironolactone) .... Take 1 Tablet By Mouth By Mouth Once Daily 11)  Vitamin D3 1000000 Unit/gm Liqd (Cholecalciferol) .... Take 1 Tab Daily  Allergies (verified): 1)  ! Vicodin   ICD Specifications Following MD:  Virl Axe, MD     ICD Vendor:  Medtronic     ICD Model Number:  [id number]     ICD Serial Number:  [id number] ICD DOI:  10/01/2010     ICD Implanting MD:  Virl Axe, MD  Lead 1:    Location: RA     DOI: 10/01/2010     Model #: [id number]     Serial #: [id number]     Status: active Lead 2:    Location: RV     DOI: 10/01/2010     Model #: [id number]     Serial #: [id number]     Status: active Lead 3:    Location: LV     DOI: 10/01/2010     Model #: [id number]     Serial #[id number]     Status: active  Indications::  NICM   ICD Follow Up Remote Check?  No Battery Voltage:  3.22 V     Charge Time:  8.4 seconds     Underlying rhythm:  SR   ICD Device Measurements Atrium:  Amplitude: 3.4 mV, Impedance: 399 ohms, Threshold: 1.0 V at 0.4 msec Right Ventricle:  Amplitude: 20 mV, Impedance: 513 ohms, Threshold: 0.5 V at 0.4 msec Left Ventricle:  Impedance: 380 ohms,  Shock  Impedance: 32/41 ohms   Episodes MS Episodes:  0     Percent Mode Switch:  0     Coumadin:  No Shock:  0     ATP:  0     Nonsustained:  0     Atrial Pacing:  0.2%     Ventricular Pacing:  99.9%  Brady Parameters Mode DDD     Lower Rate Limit:  50     Upper Rate Limit 130 PAV 160     Sensed AV Delay:  140  Tachy Zones VF:  222     VT1:  188     Next Cardiology Appt Due:  12/01/2010 Tech Comments:  Steri strips removed, no redness or edema noted.  There is some bruising under her left breast where she slipped @ home and fell.  Lead impedance alert values reprogrammed.  Device function normal.  ROV 2/27 with Dr. Caryl Comes. Alma Friendly, LPN  January 13, [id number] 11:32 AM

## 2010-11-06 NOTE — Miscellaneous (Signed)
Summary: Orders Update  Clinical Lists Changes  Orders: Added new Test order of TLB-CBC Platelet - w/Differential (85025-CBCD) - Signed 

## 2010-11-06 NOTE — Miscellaneous (Signed)
Summary: Device preload  Clinical Lists Changes  Observations: Added new observation of ICD INDICATN: NICM (10/02/2010 7:14) Added new observation of ICDLEADSTAT3: active (10/02/2010 7:14) Added new observation of ICDLEADSER3: [id number] V (10/02/2010 7:14) Added new observation of ICDLEADMOD3: 4194  (10/02/2010 7:14) Added new observation of ICDLEADLOC3: LV  (10/02/2010 7:14) Added new observation of ICDLEADSTAT2: active  (10/02/2010 7:14) Added new observation of ICDLEADSER2: [id number] V  (10/02/2010 7:14) Added new observation of ICDLEADMOD2: 6947  (10/02/2010 7:14) Added new observation of ICDLEADLOC2: RV  (10/02/2010 7:14) Added new observation of ICDLEADSTAT1: active  (10/02/2010 7:14) Added new observation of ICDLEADSER1: [id number]  (10/02/2010 7:14) Added new observation of ICDLEADMOD1: 5076  (10/02/2010 7:14) Added new observation of ICDLEADLOC1: RA  (10/02/2010 7:14) Added new observation of ICD IMP MD: Virl Axe, MD  (10/02/2010 7:14) Added new observation of ICDLEADDOI3: 10/01/2010  (10/02/2010 7:14) Added new observation of ICDLEADDOI2: 10/01/2010  (10/02/2010 7:14) Added new observation of ICDLEADDOI1: 10/01/2010  (10/02/2010 7:14) Added new observation of ICD IMPL DTE: 10/01/2010  (10/02/2010 7:14) Added new observation of ICD SERL#: [id number] H  (10/02/2010 7:14) Added new observation of ICD MODL#: [id number]  (10/02/2010 7:14) Added new observation of ICDMANUFACTR: Medtronic  (10/02/2010 7:14) Added new observation of ICD MD: Virl Axe, MD  (10/02/2010 7:14)       ICD Specifications Following MD:  Virl Axe, MD     ICD Vendor:  Medtronic     ICD Model Number:  [id number]     ICD Serial Number:  [id number] ICD DOI:  10/01/2010     ICD Implanting MD:  Virl Axe, MD  Lead 1:    Location: RA     DOI: 10/01/2010     Model #: [id number]     Serial #: [id number]     Status: active Lead 2:    Location: RV     DOI: 10/01/2010     Model #: [id number]     Serial #: [id number]      Status: active Lead 3:    Location: LV     DOI: 10/01/2010     Model #: [id number]     Serial #[id number]     Status: active  Indications::  NICM

## 2010-11-06 NOTE — Cardiovascular Report (Signed)
Summary: Pre-Op Orders  Pre-Op Orders   Imported By: Marilynne Drivers 09/16/2010 12:14:43  _____________________________________________________________________  External Attachment:    Type:   Image     Comment:   External Document

## 2010-11-06 NOTE — Progress Notes (Signed)
Summary: TALK ABOUT PROCEDURE  Phone Note Call from Patient Call back at Home Phone [phone]   Caller: Patient Reason for Call: Talk to Nurse, Talk to Doctor Summary of Call: PT IS HAVING PACER PLACEMENT ON THE [place] Initial call taken by: Shelda Pal,  September 18, 2010 8:32 AM  Follow-up for Phone Call        adv pt that appts are scheduled after her surgery and that abx are given iv before the procedure. she was scheduled for an appt in Feb. Unclear what that appt is for but will hold the slot for now. pt expressed understanding.  Follow-up by: Joan Flores RN,  September 18, 2010 9:05 AM

## 2010-11-12 NOTE — Cardiovascular Report (Signed)
Summary: Office Visit   Office Visit   Imported By: Sallee Provencal 11/05/2010 13:58:40  _____________________________________________________________________  External Attachment:    Type:   Image     Comment:   External Document

## 2010-11-13 NOTE — Discharge Summary (Signed)
[name], [name]            ACCOUNT NO.:  [id number]  MEDICAL RECORD NO.:  [id number]          PATIENT TYPE:  OIB  LOCATION:  6524                         FACILITY:  [facility]  PHYSICIAN:  Champ Mungo. Lovena Le, MD    DATE OF BIRTH:  [birth year]  DATE OF ADMISSION:  10/01/2010 DATE OF DISCHARGE:  10/02/2010                              DISCHARGE SUMMARY   PRIMARY CARDIOLOGIST:  Satira Sark, MD  ELECTROPHYSIOLOGIST:  Deboraha Sprang, MD, [facility]  PRIMARY CARE PHYSICIAN:  Angus G. Everette Rank, MD  DISCHARGE DIAGNOSES: 1. Chronic systolic congestive heart failure.     a.     Status post dual chamber defibrillator on October 01, 2010:      Successful implantation of a Medtronic BiV ICD without apparent      complication. 2. Chronic left bundle-branch block. 3. Nonischemic cardiomyopathy, left ventricular ejection fraction 20%.  SECONDARY DIAGNOSES: 1. Coronary artery disease, nonobstructive. 2. Insulin-dependent diabetes mellitus. 3. Hypertension.  ALLERGIES AND INTOLERANCES:  HYDROCODONE/APAP (hallucinations).  PROCEDURES: 1. Chest x-ray on October 01, 2010:  Cardiomegaly.  No edema. 2. Successful implantation of a BiV ICD (Medtronic) on October 01, 2010:  Please see discharge diagnoses section #1, subsection A. 3. EKG on October 02, 2010:  Atrial sensing ventricular pacing, and     rate 57 bpm, PR 128, QRS 190, QTc 525. 4. Chest x-ray on October 02, 2010:  Status post permanent     pacemaker/AICD placement.  No evidence for complicating features.  HISTORY OF PRESENT ILLNESS:  Ms. Porritt is a 65 year old female with the above-noted complex medical history who was evaluated by electrophysiologist, Dr. Virl Axe at [facility] on August 27, 2010, at the request of her primary cardiologist, Dr. [name]. Per Dr. Olin Pia evaluation, she was appropriate for VRT with BiV ICD and was scheduled for that procedure on October 01, 2010.  She presents  for that procedure as scheduled.  HOSPITAL COURSE:  The patient was admitted, underwent procedures as described above.  She tolerated them well without any significant complications.  She was kept overnight for observation and chest x-ray did not show any significant complications from PPM.  Pacemaker interrogation by Medtronic rep showed appropriate function with no changes required.  The patient was set up for a basic metabolic panel to be drawn at [facility] in [place] in just over a week and then follow up with a wound check as well as an appointment with nurse practitioner, Jory Sims on October 17, 2010, at 11 a.m.  She already has an appointment with her electrophysiologist on December 01, 2010, at 8:45 a.m. at [facility].  At the time of her discharge, the patient received her new medication list, prescriptions, followup appointments, and post PPM instructions.  All questions and concerns were addressed prior to leaving the hospital.  DISCHARGE LABS:  Glucose ranged from 193-210.  Staph aureus and MRSA negative by PCR.  FOLLOWUP PLANS AND APPOINTMENTS:  Please see hospital course.  DISCHARGE MEDICATIONS: 1. Acetaminophen 325, 1-2 tablets p.o. q.4 h. p.r.n. 2. Carvedilol 12.5 mg p.o. b.i.d. (take with meals). 3.  Enteric-coated aspirin 81 mg p.o. daily. 4. Colace 100 mg 1 capsule p.o. daily p.r.n. 5. Digoxin 0.125 mg 1 tablet p.o. daily. 6. Furosemide 20 mg 1 tablet daily p.r.n. 7. Lantus insulin 40 units subcu injection q.a.m. 8. Losartan 25 mg 1 tablet p.o. q.a.m. and 2 tablets p.o. q.p.m. 9. Sublingual nitroglycerin 0.4 mg p.r.n. for chest discomfort (q.5     minutes up to three doses). 10.Potassium chloride 10 mEq p.o. daily (take with Lasix only). 11.Spironolactone 25 mg 1 tablet p.o. daily. 12.Vitamin D3 1000 units 1 tablet p.o. daily.  DURATION OF DISCHARGE ENCOUNTER INCLUDING PHYSICIAN TIME:  35 minutes.     Guss Bunde, PAC   ______________________________ Champ Mungo. Lovena Le, MD    MS/MEDQ  D:  10/02/2010  T:  10/03/2010  Job:  [id number]  cc:   Phill Myron. Purcell Nails, NP Angus G. Everette Rank, MD Deboraha Sprang, MD, [facility] Satira Sark, MD  Electronically Signed by Guss Bunde PAC on 10/15/2010 10:52:12 AM Electronically Signed by [name] MD on 11/13/2010 05:04:19 PM

## 2010-12-01 ENCOUNTER — Encounter: Payer: Self-pay | Admitting: Internal Medicine

## 2010-12-01 ENCOUNTER — Encounter (INDEPENDENT_AMBULATORY_CARE_PROVIDER_SITE_OTHER): Payer: Managed Care, Other (non HMO)

## 2010-12-01 ENCOUNTER — Encounter (INDEPENDENT_AMBULATORY_CARE_PROVIDER_SITE_OTHER): Payer: Managed Care, Other (non HMO) | Admitting: Internal Medicine

## 2010-12-01 DIAGNOSIS — R609 Edema, unspecified: Secondary | ICD-10-CM

## 2010-12-01 DIAGNOSIS — I5022 Chronic systolic (congestive) heart failure: Secondary | ICD-10-CM

## 2010-12-01 DIAGNOSIS — R0989 Other specified symptoms and signs involving the circulatory and respiratory systems: Secondary | ICD-10-CM

## 2010-12-01 DIAGNOSIS — M7989 Other specified soft tissue disorders: Secondary | ICD-10-CM

## 2010-12-01 DIAGNOSIS — Z95810 Presence of automatic (implantable) cardiac defibrillator: Secondary | ICD-10-CM

## 2010-12-01 DIAGNOSIS — I428 Other cardiomyopathies: Secondary | ICD-10-CM

## 2010-12-11 NOTE — Assessment & Plan Note (Signed)
Summary: rov f,11.24.11 per pt call/mj/jml   Visit Type:  Pacemaker check Primary Provider:  Dr. Marjean Donna  CC:  some CP.  History of Present Illness: [name] is in followup for CRT-D implantation for congestive heart failure in the setting of nonischemic cardiomyopathy with EF of 20%.   She was symptomatic with frequent episodes of dizziness and profound fatigue.   Since discharge she has been feeling much better, less fatigued and having very little episodes of dizziness.  She remains sore over pacemaker site.   Appetite is much improved. He is less dyspnea on exertion. She has however noted unilateral left lower leg edema  His also noted recent increase in her weight  Current Medications (verified): 1)  Aspir-Low 81 Mg Tbec (Aspirin) .... Take 1 Tab Daily 2)  Losartan Potassium 25 Mg Tabs (Losartan Potassium) .... Take 1 in Am and 2 in Pm 3)  Carvedilol 12.5 Mg Tabs (Carvedilol) .... Take 1 Tab Two Times A Day 4)  Nitrostat 0.4 Mg Subl (Nitroglycerin) .... Take As Directed For Chestpain 5)  Lantus 100 Unit/ml Soln (Insulin Glargine) .... 40 Units Daily 6)  Digoxin 0.125 Mg Tabs (Digoxin) .... Take 1 Tablet By Mouth Once Daily 7)  Colace 100 Mg Caps (Docusate Sodium) .... Take 1 Tablet Daily As Needed For Constipation 8)  Aldactone 25 Mg Tabs (Spironolactone) .... Take 1 Tablet By Mouth By Mouth Once Daily 9)  Vitamin D3 1000000 Unit/gm Liqd (Cholecalciferol) .... Take 1 Tab Daily  Allergies (verified): 1)  ! Vicodin  Vital Signs:  Patient profile:   65 year old female Height:      61 inches Weight:      147 pounds BMI:     27.88 Pulse rate:   70 / minute BP sitting:   178 / 70  (left arm) Cuff size:   regular  Vitals Entered By: Mignon Pine, RMA (December 01, 2010 9:02 AM)   Physical Exam  General:  The patient was alert and oriented in no acute distress. HEENT Normal.  Neck veins were height-6 cm, carotids were brisk.  Lungs were clear.  Device pocket  well healed without erythema; minimal tenderness Heart sounds were regular with 2/6 murmur at the  left lower sternal border Abdomen was soft with active bowel sounds. There is no clubbing cyanosis or edema. Skin Warm and dry     ICD Specifications Following MD:  Virl Axe, MD     ICD Vendor:  Medtronic     ICD Model Number:  [id number]     ICD Serial Number:  [id number] ICD DOI:  10/01/2010     ICD Implanting MD:  Virl Axe, MD  Lead 1:    Location: RA     DOI: 10/01/2010     Model #: [id number]     Serial #: [id number]     Status: active Lead 2:    Location: RV     DOI: 10/01/2010     Model #: [id number]     Serial #: [id number]     Status: active Lead 3:    Location: LV     DOI: 10/01/2010     Model #: [id number]     Serial #: [id number]     Status: active  Indications::  NICM   Episodes Coumadin:  No  Brady Parameters Mode DDD     Lower Rate Limit:  50     Upper Rate Limit 130 PAV 160     Sensed AV Delay:  140  Tachy Zones VF:  222     VT1:  188     Impression & Recommendations:  Problem # 1:  IMPLANTATION OF DEFIBRILLATOR MEDTRONIC BIV ICD D314TRG (ICD-V45.02) s/post CRT implantation; all echo cardiogram demonstrates an unusual biventricular pacing pattern with a left bundle-branch block morphology. This however is consistent with a post implant ECG which was reviewed as was a chest x-ray demonstrating appropriate lateral placement  Problem # 2:  CHRONIC SYSTOLIC HEART FAILURE (123456)  She is much improved following   CRT-D implantation with less fatigue dyspnea and improved appetite.  The following medications were removed from the medication list:    Furosemide 20 Mg Tabs (Furosemide) .Marland Kitchen... Take 1 tablet by mouth prn  for swelling Her updated medication list for this problem includes:    Aspir-low 81 Mg Tbec (Aspirin) .Marland Kitchen... Take 1 tab daily    Losartan Potassium 25 Mg Tabs (Losartan potassium) .Marland Kitchen... Take 1 in am and 2 in pm    Carvedilol 12.5 Mg Tabs (Carvedilol) .Marland Kitchen... Take 1  tab two times a day    Nitrostat 0.4 Mg Subl (Nitroglycerin) .Marland Kitchen... Take as directed for chestpain    Digoxin 0.125 Mg Tabs (Digoxin) .Marland Kitchen... Take 1 tablet by mouth once daily    Aldactone 25 Mg Tabs (Spironolactone) .Marland Kitchen... Take 1 tablet by mouth by mouth once daily  Problem # 3:  EDEMA (ICD-782.3) given the unilaterality of the edema which is new, we will undertake a venous Doppler to exclude deep venous thrombosis. Orders: Venous Duplex Lower Extremity (Venous Duplex Lower)  Problem # 4:  CHEST PAIN (ICD-786.50)  her device pocket looks well-healed Her updated medication list for this problem includes:    Aspir-low 81 Mg Tbec (Aspirin) .Marland Kitchen... Take 1 tab daily    Carvedilol 12.5 Mg Tabs (Carvedilol) .Marland Kitchen... Take 1 tab two times a day    Nitrostat 0.4 Mg Subl (Nitroglycerin) .Marland Kitchen... Take as directed for chestpain  Orders: EKG w/ Interpretation (93000)  Her updated medication list for this problem includes:    Aspir-low 81 Mg Tbec (Aspirin) .Marland Kitchen... Take 1 tab daily    Carvedilol 12.5 Mg Tabs (Carvedilol) .Marland Kitchen... Take 1 tab two times a day    Nitrostat 0.4 Mg Subl (Nitroglycerin) .Marland Kitchen... Take as directed for chestpain  Patient Instructions: 1)  Your physician recommends that you schedule a follow-up appointment in: [place] 2)  Your physician recommends that you continue on your current medications as directed. Please refer to the Current Medication list given to you today. 3)  Your physician has requested that you have a lower or upper extremity venous duplex.  This test is an ultrasound of the veins in the legs or arms.  It looks at venous blood flow that carries blood from the heart to the legs or arms.  Allow one hour for a Lower Venous exam.  Allow thirty minutes for an Upper Venous exam. There are no restrictions or special instructions. TODAY TO R/O DVT

## 2010-12-11 NOTE — Cardiovascular Report (Signed)
Summary: Office Visit   Office Visit   Imported By: Sallee Provencal 12/04/2010 16:11:01  _____________________________________________________________________  External Attachment:    Type:   Image     Comment:   External Document

## 2010-12-15 LAB — GLUCOSE, CAPILLARY
Glucose-Capillary: 123 mg/dL — ABNORMAL HIGH (ref 70–99)
Glucose-Capillary: 159 mg/dL — ABNORMAL HIGH (ref 70–99)
Glucose-Capillary: 210 mg/dL — ABNORMAL HIGH (ref 70–99)
Glucose-Capillary: 240 mg/dL — ABNORMAL HIGH (ref 70–99)

## 2010-12-16 LAB — POCT I-STAT 3, VENOUS BLOOD GAS (G3P V)
Bicarbonate: 23 mEq/L (ref 20.0–24.0)
Bicarbonate: 23.7 mEq/L (ref 20.0–24.0)
O2 Saturation: 59 %
O2 Saturation: 63 %
TCO2: 24 mmol/L (ref 0–100)
pCO2, Ven: 42.7 mmHg — ABNORMAL LOW (ref 45.0–50.0)
pCO2, Ven: 43.9 mmHg — ABNORMAL LOW (ref 45.0–50.0)
pH, Ven: 7.331 — ABNORMAL HIGH (ref 7.250–7.300)
pH, Ven: 7.339 — ABNORMAL HIGH (ref 7.250–7.300)
pH, Ven: 7.352 — ABNORMAL HIGH (ref 7.250–7.300)
pO2, Ven: 33 mmHg (ref 30.0–45.0)
pO2, Ven: 34 mmHg (ref 30.0–45.0)
pO2, Ven: 35 mmHg (ref 30.0–45.0)

## 2010-12-16 LAB — POCT I-STAT 3, ART BLOOD GAS (G3+)
Acid-base deficit: 3 mmol/L — ABNORMAL HIGH (ref 0.0–2.0)
Bicarbonate: 22 mEq/L (ref 20.0–24.0)
pH, Arterial: 7.382 (ref 7.350–7.400)

## 2010-12-16 LAB — POCT I-STAT GLUCOSE: Operator id: 194801

## 2010-12-24 LAB — BASIC METABOLIC PANEL
BUN: 18 mg/dL (ref 6–23)
Calcium: 8.6 mg/dL (ref 8.4–10.5)
Chloride: 108 mEq/L (ref 96–112)
Creatinine, Ser: 0.82 mg/dL (ref 0.4–1.2)
GFR calc Af Amer: >60 mL/min (ref >60)
GFR calc non Af Amer: >60 mL/min (ref >60)

## 2010-12-24 LAB — GLUCOSE, CAPILLARY: Glucose-Capillary: 239 mg/dL — ABNORMAL HIGH (ref 70–99)

## 2010-12-24 LAB — CBC
MCV: 90.8 fL (ref 78.0–100.0)
Platelets: 177 10*3/uL (ref 150–400)
RBC: 3.53 MIL/uL — ABNORMAL LOW (ref 3.87–5.11)
WBC: 7.2 10*3/uL (ref 4.0–10.5)

## 2010-12-24 LAB — URINALYSIS, ROUTINE W REFLEX MICROSCOPIC
Hgb urine dipstick: NEGATIVE
Nitrite: NEGATIVE
Protein, ur: NEGATIVE mg/dL
Specific Gravity, Urine: 1.01 (ref 1.005–1.030)
Urobilinogen, UA: 0.2 mg/dL (ref 0.0–1.0)

## 2010-12-24 LAB — DIFFERENTIAL
Eosinophils Absolute: 0.1 10*3/uL (ref 0.0–0.7)
Lymphs Abs: 1.9 10*3/uL (ref 0.7–4.0)
Monocytes Relative: 10 % (ref 3–12)
Neutro Abs: 4.5 10*3/uL (ref 1.7–7.7)
Neutrophils Relative %: 63 % (ref 43–77)

## 2010-12-28 LAB — HEMOGLOBIN AND HEMATOCRIT, BLOOD: Hemoglobin: 11.6 g/dL — ABNORMAL LOW (ref 12.0–15.0)

## 2010-12-28 LAB — BASIC METABOLIC PANEL
BUN: 22 mg/dL (ref 6–23)
CO2: 26 mEq/L (ref 19–32)
Creatinine, Ser: 0.73 mg/dL (ref 0.4–1.2)
GFR calc Af Amer: >60 mL/min (ref >60)
Glucose, Bld: 244 mg/dL — ABNORMAL HIGH (ref 70–99)
Potassium: 4.6 mEq/L (ref 3.5–5.1)
Sodium: 140 mEq/L (ref 135–145)

## 2010-12-29 LAB — POCT I-STAT 3, VENOUS BLOOD GAS (G3P V)
Acid-base deficit: 1 mmol/L (ref 0.0–2.0)
O2 Saturation: 61 %
TCO2: 26 mmol/L (ref 0–100)
pO2, Ven: 34 mmHg (ref 30.0–45.0)

## 2010-12-29 LAB — POCT I-STAT GLUCOSE
Glucose, Bld: 153 mg/dL — ABNORMAL HIGH (ref 70–99)
Operator id: 122531

## 2010-12-29 LAB — POCT I-STAT 3, ART BLOOD GAS (G3+)
O2 Saturation: 94 %
TCO2: 26 mmol/L (ref 0–100)
pH, Arterial: 7.376 (ref 7.350–7.400)

## 2011-01-28 ENCOUNTER — Other Ambulatory Visit: Payer: Self-pay | Admitting: *Deleted

## 2011-01-28 MED ORDER — SPIRONOLACTONE 25 MG PO TABS: 25.0000 mg | ORAL_TABLET | Freq: Every day | ORAL | Status: DC

## 2011-01-28 MED ORDER — LOSARTAN POTASSIUM 25 MG PO TABS
25.0000 mg | ORAL_TABLET | ORAL | Status: DC
Start: 2011-01-28 — End: 2011-04-07

## 2011-01-28 MED ORDER — CARVEDILOL 12.5 MG PO TABS: 12.5000 mg | ORAL_TABLET | Freq: Two times a day (BID) | ORAL | Status: DC

## 2011-01-28 MED ORDER — DIGOXIN 250 MCG PO TABS: 0.1250 mg | ORAL_TABLET | Freq: Two times a day (BID) | ORAL | Status: DC

## 2011-02-02 ENCOUNTER — Telehealth: Payer: Self-pay | Admitting: Cardiology

## 2011-02-02 NOTE — Telephone Encounter (Signed)
PHARMACY STATES THAT THEY HAVE BEEN TRYING TO GET MEDS FILLED SINCE 01/23/11 OR BEFORE. THEY ARE ALL NEW MEDICATION SO THEY NEED ALL INFO INCLUDING DEA NUMBERS ON THESE.  K CLORIDE 10 MEG' FUROSEMIDE 40MG  SPIRONOLACTONE 25MG   CARVEDILOL 12.5MG  LOSARTAN 25MG  DIXOGIN 250MICRO  PLEASE PROVIDE SHIPMENT NUMBER WHEN CALLING IN [id number]

## 2011-02-09 ENCOUNTER — Ambulatory Visit (INDEPENDENT_AMBULATORY_CARE_PROVIDER_SITE_OTHER): Payer: Managed Care, Other (non HMO) | Admitting: *Deleted

## 2011-02-09 DIAGNOSIS — I5022 Chronic systolic (congestive) heart failure: Secondary | ICD-10-CM

## 2011-02-09 DIAGNOSIS — I428 Other cardiomyopathies: Secondary | ICD-10-CM

## 2011-02-14 ENCOUNTER — Emergency Department (HOSPITAL_COMMUNITY)
Admission: EM | Admit: 2011-02-14 | Discharge: 2011-02-14 | Disposition: A | Discharge status: Home / Self Care | Payer: Managed Care, Other (non HMO) | Attending: Emergency Medicine | Admitting: Emergency Medicine

## 2011-02-14 DIAGNOSIS — R112 Nausea with vomiting, unspecified: Secondary | ICD-10-CM | POA: Insufficient documentation

## 2011-02-14 DIAGNOSIS — I10 Essential (primary) hypertension: Secondary | ICD-10-CM | POA: Insufficient documentation

## 2011-02-14 DIAGNOSIS — Z79899 Other long term (current) drug therapy: Secondary | ICD-10-CM | POA: Insufficient documentation

## 2011-02-14 DIAGNOSIS — I509 Heart failure, unspecified: Secondary | ICD-10-CM | POA: Insufficient documentation

## 2011-02-14 DIAGNOSIS — E119 Type 2 diabetes mellitus without complications: Secondary | ICD-10-CM | POA: Insufficient documentation

## 2011-02-14 DIAGNOSIS — R197 Diarrhea, unspecified: Secondary | ICD-10-CM | POA: Insufficient documentation

## 2011-02-14 LAB — URINALYSIS, ROUTINE W REFLEX MICROSCOPIC
Leukocytes, UA: NEGATIVE
Nitrite: NEGATIVE
Protein, ur: 30 mg/dL — AB
Urobilinogen, UA: 0.2 mg/dL (ref 0.0–1.0)

## 2011-02-14 LAB — BASIC METABOLIC PANEL
BUN: 35 mg/dL — ABNORMAL HIGH (ref 6–23)
Calcium: 11.3 mg/dL — ABNORMAL HIGH (ref 8.4–10.5)
Creatinine, Ser: 1.1 mg/dL (ref 0.4–1.2)
GFR calc non Af Amer: 50 mL/min — ABNORMAL LOW (ref >60)
Potassium: 4.2 mEq/L (ref 3.5–5.1)

## 2011-02-20 ENCOUNTER — Emergency Department (HOSPITAL_COMMUNITY): Payer: Managed Care, Other (non HMO)

## 2011-02-20 ENCOUNTER — Inpatient Hospital Stay (HOSPITAL_COMMUNITY)
Admission: EM | Admit: 2011-02-20 | Discharge: 2011-03-04 | DRG: 336 | Disposition: A | Discharge status: Home / Self Care | Payer: Managed Care, Other (non HMO) | Attending: General Surgery | Admitting: General Surgery

## 2011-02-20 DIAGNOSIS — K565 Intestinal adhesions [bands], unspecified as to partial versus complete obstruction: Principal | ICD-10-CM | POA: Diagnosis present

## 2011-02-20 DIAGNOSIS — E119 Type 2 diabetes mellitus without complications: Secondary | ICD-10-CM | POA: Diagnosis present

## 2011-02-20 DIAGNOSIS — I10 Essential (primary) hypertension: Secondary | ICD-10-CM | POA: Diagnosis present

## 2011-02-20 DIAGNOSIS — E876 Hypokalemia: Secondary | ICD-10-CM | POA: Diagnosis not present

## 2011-02-20 DIAGNOSIS — N390 Urinary tract infection, site not specified: Secondary | ICD-10-CM | POA: Diagnosis present

## 2011-02-20 DIAGNOSIS — E860 Dehydration: Secondary | ICD-10-CM | POA: Diagnosis present

## 2011-02-20 DIAGNOSIS — Z4502 Encounter for adjustment and management of automatic implantable cardiac defibrillator: Secondary | ICD-10-CM

## 2011-02-20 DIAGNOSIS — N289 Disorder of kidney and ureter, unspecified: Secondary | ICD-10-CM | POA: Diagnosis present

## 2011-02-20 LAB — DIFFERENTIAL
Eosinophils Absolute: 0 10*3/uL (ref 0.0–0.7)
Eosinophils Relative: 0 % (ref 0–5)
Lymphs Abs: 1.9 10*3/uL (ref 0.7–4.0)

## 2011-02-20 LAB — COMPREHENSIVE METABOLIC PANEL
CO2: 29 mEq/L (ref 19–32)
Calcium: 10.5 mg/dL (ref 8.4–10.5)
Creatinine, Ser: 2.78 mg/dL — ABNORMAL HIGH (ref 0.4–1.2)
GFR calc Af Amer: 21 mL/min — ABNORMAL LOW (ref >60)
GFR calc non Af Amer: 17 mL/min — ABNORMAL LOW (ref >60)
Glucose, Bld: 230 mg/dL — ABNORMAL HIGH (ref 70–99)

## 2011-02-20 LAB — URINE MICROSCOPIC-ADD ON

## 2011-02-20 LAB — LIPASE, BLOOD: Lipase: 42 U/L (ref 11–59)

## 2011-02-20 LAB — URINALYSIS, ROUTINE W REFLEX MICROSCOPIC
Glucose, UA: NEGATIVE mg/dL
Ketones, ur: NEGATIVE mg/dL
Specific Gravity, Urine: >1.03 — ABNORMAL HIGH (ref 1.005–1.030)
pH: 5 (ref 5.0–8.0)

## 2011-02-20 LAB — CBC
MCH: 29.9 pg (ref 26.0–34.0)
MCV: 85.9 fL (ref 78.0–100.0)
Platelets: 196 10*3/uL (ref 150–400)
RDW: 13.6 % (ref 11.5–15.5)
WBC: 10.7 10*3/uL — ABNORMAL HIGH (ref 4.0–10.5)

## 2011-02-21 LAB — GLUCOSE, CAPILLARY
Glucose-Capillary: 76 mg/dL (ref 70–99)
Glucose-Capillary: 77 mg/dL (ref 70–99)

## 2011-02-21 LAB — CBC
Hemoglobin: 12.1 g/dL (ref 12.0–15.0)
MCHC: 33.6 g/dL (ref 30.0–36.0)
Platelets: 177 10*3/uL (ref 150–400)
RBC: 4.14 MIL/uL (ref 3.87–5.11)

## 2011-02-21 LAB — DIFFERENTIAL
Basophils Absolute: 0 10*3/uL (ref 0.0–0.1)
Lymphocytes Relative: 17 % (ref 12–46)
Monocytes Relative: 15 % — ABNORMAL HIGH (ref 3–12)

## 2011-02-21 LAB — BASIC METABOLIC PANEL
GFR calc Af Amer: 20 mL/min — ABNORMAL LOW (ref >60)
GFR calc non Af Amer: 16 mL/min — ABNORMAL LOW (ref >60)
Potassium: 3.7 mEq/L (ref 3.5–5.1)
Sodium: 136 mEq/L (ref 135–145)

## 2011-02-22 LAB — DIFFERENTIAL
Basophils Absolute: 0 10*3/uL (ref 0.0–0.1)
Eosinophils Absolute: 0.1 10*3/uL (ref 0.0–0.7)
Eosinophils Relative: 1 % (ref 0–5)
Lymphocytes Relative: 25 % (ref 12–46)
Neutrophils Relative %: 66 % (ref 43–77)

## 2011-02-22 LAB — GLUCOSE, CAPILLARY
Glucose-Capillary: 83 mg/dL (ref 70–99)
Glucose-Capillary: 96 mg/dL (ref 70–99)
Glucose-Capillary: 173 mg/dL — ABNORMAL HIGH (ref 70–99)

## 2011-02-22 LAB — BASIC METABOLIC PANEL
GFR calc non Af Amer: 24 mL/min — ABNORMAL LOW (ref >60)
Potassium: 3.3 mEq/L — ABNORMAL LOW (ref 3.5–5.1)
Sodium: 136 mEq/L (ref 135–145)

## 2011-02-22 LAB — CBC
Platelets: 178 10*3/uL (ref 150–400)
RBC: 4.35 MIL/uL (ref 3.87–5.11)
RDW: 13.2 % (ref 11.5–15.5)
WBC: 8.2 10*3/uL (ref 4.0–10.5)

## 2011-02-22 LAB — URINE CULTURE

## 2011-02-23 LAB — GLUCOSE, CAPILLARY
Glucose-Capillary: 182 mg/dL — ABNORMAL HIGH (ref 70–99)
Glucose-Capillary: 190 mg/dL — ABNORMAL HIGH (ref 70–99)

## 2011-02-23 LAB — BASIC METABOLIC PANEL
Chloride: 89 mEq/L — ABNORMAL LOW (ref 96–112)
Creatinine, Ser: 2.1 mg/dL — ABNORMAL HIGH (ref 0.4–1.2)
GFR calc Af Amer: 29 mL/min — ABNORMAL LOW (ref >60)
Potassium: 3.8 mEq/L (ref 3.5–5.1)
Sodium: 130 mEq/L — ABNORMAL LOW (ref 135–145)

## 2011-02-23 LAB — DIFFERENTIAL
Eosinophils Absolute: 0.1 10*3/uL (ref 0.0–0.7)
Eosinophils Relative: 1 % (ref 0–5)
Lymphocytes Relative: 22 % (ref 12–46)
Lymphs Abs: 1.8 10*3/uL (ref 0.7–4.0)
Monocytes Absolute: 0.8 10*3/uL (ref 0.1–1.0)
Monocytes Relative: 9 % (ref 3–12)

## 2011-02-23 LAB — CBC
HCT: 37.7 % (ref 36.0–46.0)
MCH: 29.1 pg (ref 26.0–34.0)
MCHC: 33.7 g/dL (ref 30.0–36.0)
MCV: 86.3 fL (ref 78.0–100.0)
RDW: 13.2 % (ref 11.5–15.5)

## 2011-02-23 LAB — PHOSPHORUS: Phosphorus: 2.6 mg/dL (ref 2.3–4.6)

## 2011-02-23 LAB — MAGNESIUM: Magnesium: 2.7 mg/dL — ABNORMAL HIGH (ref 1.5–2.5)

## 2011-02-24 LAB — DIFFERENTIAL
Basophils Absolute: 0 10*3/uL (ref 0.0–0.1)
Lymphocytes Relative: 23 % (ref 12–46)
Lymphs Abs: 1.9 10*3/uL (ref 0.7–4.0)
Monocytes Absolute: 0.6 10*3/uL (ref 0.1–1.0)
Neutro Abs: 5.7 10*3/uL (ref 1.7–7.7)

## 2011-02-24 LAB — CBC
HCT: 35.6 % — ABNORMAL LOW (ref 36.0–46.0)
Hemoglobin: 11.9 g/dL — ABNORMAL LOW (ref 12.0–15.0)
MCHC: 33.4 g/dL (ref 30.0–36.0)
WBC: 8.2 10*3/uL (ref 4.0–10.5)

## 2011-02-24 LAB — GLUCOSE, CAPILLARY: Glucose-Capillary: 283 mg/dL — ABNORMAL HIGH (ref 70–99)

## 2011-02-24 LAB — MAGNESIUM: Magnesium: 2.5 mg/dL (ref 1.5–2.5)

## 2011-02-24 LAB — BASIC METABOLIC PANEL
BUN: 43 mg/dL — ABNORMAL HIGH (ref 6–23)
CO2: 30 mEq/L (ref 19–32)
Chloride: 92 mEq/L — ABNORMAL LOW (ref 96–112)
Potassium: 4.3 mEq/L (ref 3.5–5.1)

## 2011-02-25 LAB — DIFFERENTIAL
Basophils Absolute: 0 10*3/uL (ref 0.0–0.1)
Lymphocytes Relative: 17 % (ref 12–46)
Monocytes Absolute: 0.9 10*3/uL (ref 0.1–1.0)
Monocytes Relative: 9 % (ref 3–12)
Neutro Abs: 7.6 10*3/uL (ref 1.7–7.7)

## 2011-02-25 LAB — COMPREHENSIVE METABOLIC PANEL
ALT: 31 U/L (ref 0–35)
CO2: 29 mEq/L (ref 19–32)
Calcium: 10.2 mg/dL (ref 8.4–10.5)
GFR calc non Af Amer: 30 mL/min — ABNORMAL LOW (ref >60)
Glucose, Bld: 180 mg/dL — ABNORMAL HIGH (ref 70–99)
Sodium: 134 mEq/L — ABNORMAL LOW (ref 135–145)
Total Bilirubin: 0.3 mg/dL (ref 0.3–1.2)

## 2011-02-25 LAB — GLUCOSE, CAPILLARY
Glucose-Capillary: 178 mg/dL — ABNORMAL HIGH (ref 70–99)
Glucose-Capillary: 182 mg/dL — ABNORMAL HIGH (ref 70–99)
Glucose-Capillary: 147 mg/dL — ABNORMAL HIGH (ref 70–99)

## 2011-02-25 LAB — CBC
HCT: 34.6 % — ABNORMAL LOW (ref 36.0–46.0)
Hemoglobin: 11.6 g/dL — ABNORMAL LOW (ref 12.0–15.0)
MCH: 29 pg (ref 26.0–34.0)
MCHC: 33.5 g/dL (ref 30.0–36.0)

## 2011-02-25 LAB — MAGNESIUM: Magnesium: 2.5 mg/dL (ref 1.5–2.5)

## 2011-02-26 LAB — CBC
HCT: 31.6 % — ABNORMAL LOW (ref 36.0–46.0)
Hemoglobin: 10.7 g/dL — ABNORMAL LOW (ref 12.0–15.0)
MCH: 29.6 pg (ref 26.0–34.0)
MCHC: 33.9 g/dL (ref 30.0–36.0)

## 2011-02-26 LAB — GLUCOSE, CAPILLARY
Glucose-Capillary: 187 mg/dL — ABNORMAL HIGH (ref 70–99)
Glucose-Capillary: 141 mg/dL — ABNORMAL HIGH (ref 70–99)

## 2011-02-26 LAB — BASIC METABOLIC PANEL
CO2: 29 mEq/L (ref 19–32)
Calcium: 9 mg/dL (ref 8.4–10.5)
Creatinine, Ser: 1.26 mg/dL — ABNORMAL HIGH (ref 0.4–1.2)
Glucose, Bld: 144 mg/dL — ABNORMAL HIGH (ref 70–99)

## 2011-02-26 LAB — DIFFERENTIAL
Lymphocytes Relative: 14 % (ref 12–46)
Monocytes Absolute: 0.9 10*3/uL (ref 0.1–1.0)
Monocytes Relative: 8 % (ref 3–12)
Neutro Abs: 8.6 10*3/uL — ABNORMAL HIGH (ref 1.7–7.7)

## 2011-02-27 LAB — GLUCOSE, CAPILLARY

## 2011-02-27 LAB — PHOSPHORUS: Phosphorus: 3.2 mg/dL (ref 2.3–4.6)

## 2011-02-27 LAB — CBC
HCT: 29 % — ABNORMAL LOW (ref 36.0–46.0)
Hemoglobin: 10 g/dL — ABNORMAL LOW (ref 12.0–15.0)
MCV: 88.4 fL (ref 78.0–100.0)
WBC: 11.1 10*3/uL — ABNORMAL HIGH (ref 4.0–10.5)

## 2011-02-27 LAB — DIFFERENTIAL
Basophils Absolute: 0 10*3/uL (ref 0.0–0.1)
Monocytes Relative: 9 % (ref 3–12)
Neutrophils Relative %: 79 % — ABNORMAL HIGH (ref 43–77)

## 2011-02-27 LAB — BASIC METABOLIC PANEL
Chloride: 100 mEq/L (ref 96–112)
Creatinine, Ser: 1.15 mg/dL (ref 0.4–1.2)
GFR calc Af Amer: 57 mL/min — ABNORMAL LOW (ref >60)
GFR calc non Af Amer: 48 mL/min — ABNORMAL LOW (ref >60)
Potassium: 3.9 mEq/L (ref 3.5–5.1)

## 2011-02-27 LAB — MAGNESIUM: Magnesium: 2 mg/dL (ref 1.5–2.5)

## 2011-02-28 LAB — CROSSMATCH
ABO/RH(D): B POS
Antibody Screen: NEGATIVE
Unit division: 0

## 2011-02-28 LAB — CBC
MCH: 29.4 pg (ref 26.0–34.0)
MCHC: 33.5 g/dL (ref 30.0–36.0)
MCV: 87.7 fL (ref 78.0–100.0)
Platelets: 150 10*3/uL (ref 150–400)
RDW: 13.7 % (ref 11.5–15.5)
WBC: 9.2 10*3/uL (ref 4.0–10.5)

## 2011-02-28 LAB — COMPREHENSIVE METABOLIC PANEL
Albumin: 2.6 g/dL — ABNORMAL LOW (ref 3.5–5.2)
BUN: 20 mg/dL (ref 6–23)
Calcium: 9.2 mg/dL (ref 8.4–10.5)
Creatinine, Ser: 0.87 mg/dL (ref 0.4–1.2)
Glucose, Bld: 190 mg/dL — ABNORMAL HIGH (ref 70–99)
Total Protein: 6.3 g/dL (ref 6.0–8.3)

## 2011-02-28 LAB — DIFFERENTIAL
Eosinophils Absolute: 0.2 10*3/uL (ref 0.0–0.7)
Eosinophils Relative: 2 % (ref 0–5)
Lymphs Abs: 0.9 10*3/uL (ref 0.7–4.0)
Monocytes Absolute: 1 10*3/uL (ref 0.1–1.0)
Monocytes Relative: 11 % (ref 3–12)

## 2011-02-28 LAB — MAGNESIUM: Magnesium: 2 mg/dL (ref 1.5–2.5)

## 2011-02-28 LAB — GLUCOSE, CAPILLARY
Glucose-Capillary: 162 mg/dL — ABNORMAL HIGH (ref 70–99)
Glucose-Capillary: 233 mg/dL — ABNORMAL HIGH (ref 70–99)
Glucose-Capillary: 277 mg/dL — ABNORMAL HIGH (ref 70–99)

## 2011-02-28 LAB — PHOSPHORUS: Phosphorus: 1.7 mg/dL — ABNORMAL LOW (ref 2.3–4.6)

## 2011-03-01 LAB — DIFFERENTIAL
Basophils Relative: 0 % (ref 0–1)
Eosinophils Absolute: 0.2 10*3/uL (ref 0.0–0.7)
Lymphs Abs: 1.5 10*3/uL (ref 0.7–4.0)
Monocytes Relative: 9 % (ref 3–12)
Neutro Abs: 5.5 10*3/uL (ref 1.7–7.7)
Neutrophils Relative %: 71 % (ref 43–77)

## 2011-03-01 LAB — COMPREHENSIVE METABOLIC PANEL
ALT: 15 U/L (ref 0–35)
AST: 17 U/L (ref 0–37)
Albumin: 2.7 g/dL — ABNORMAL LOW (ref 3.5–5.2)
Alkaline Phosphatase: 76 U/L (ref 39–117)
CO2: 29 mEq/L (ref 19–32)
Chloride: 97 mEq/L (ref 96–112)
Creatinine, Ser: 0.77 mg/dL (ref 0.4–1.2)
GFR calc Af Amer: >60 mL/min (ref >60)
GFR calc non Af Amer: >60 mL/min (ref >60)
Potassium: 3.2 mEq/L — ABNORMAL LOW (ref 3.5–5.1)
Total Bilirubin: 0.3 mg/dL (ref 0.3–1.2)

## 2011-03-01 LAB — GLUCOSE, CAPILLARY: Glucose-Capillary: 185 mg/dL — ABNORMAL HIGH (ref 70–99)

## 2011-03-01 LAB — CBC
Hemoglobin: 9.8 g/dL — ABNORMAL LOW (ref 12.0–15.0)
MCH: 29.6 pg (ref 26.0–34.0)
Platelets: 163 10*3/uL (ref 150–400)
RBC: 3.31 MIL/uL — ABNORMAL LOW (ref 3.87–5.11)
WBC: 7.8 10*3/uL (ref 4.0–10.5)

## 2011-03-02 LAB — COMPREHENSIVE METABOLIC PANEL
ALT: 15 U/L (ref 0–35)
AST: 23 U/L (ref 0–37)
Alkaline Phosphatase: 80 U/L (ref 39–117)
CO2: 27 mEq/L (ref 19–32)
Calcium: 8.9 mg/dL (ref 8.4–10.5)
GFR calc Af Amer: >60 mL/min (ref >60)
GFR calc non Af Amer: >60 mL/min (ref >60)
Glucose, Bld: 173 mg/dL — ABNORMAL HIGH (ref 70–99)
Potassium: 4.5 mEq/L (ref 3.5–5.1)
Sodium: 134 mEq/L — ABNORMAL LOW (ref 135–145)

## 2011-03-02 LAB — DIFFERENTIAL
Basophils Absolute: 0 10*3/uL (ref 0.0–0.1)
Basophils Relative: 0 % (ref 0–1)
Lymphocytes Relative: 14 % (ref 12–46)
Monocytes Absolute: 1.2 10*3/uL — ABNORMAL HIGH (ref 0.1–1.0)
Neutro Abs: 6.7 10*3/uL (ref 1.7–7.7)
Neutrophils Relative %: 72 % (ref 43–77)

## 2011-03-02 LAB — GLUCOSE, CAPILLARY
Glucose-Capillary: 134 mg/dL — ABNORMAL HIGH (ref 70–99)
Glucose-Capillary: 164 mg/dL — ABNORMAL HIGH (ref 70–99)
Glucose-Capillary: 169 mg/dL — ABNORMAL HIGH (ref 70–99)

## 2011-03-02 LAB — CBC
HCT: 29 % — ABNORMAL LOW (ref 36.0–46.0)
Hemoglobin: 10 g/dL — ABNORMAL LOW (ref 12.0–15.0)
MCHC: 34.5 g/dL (ref 30.0–36.0)
RBC: 3.32 MIL/uL — ABNORMAL LOW (ref 3.87–5.11)
WBC: 9.3 10*3/uL (ref 4.0–10.5)

## 2011-03-03 LAB — CBC
Hemoglobin: 9.6 g/dL — ABNORMAL LOW (ref 12.0–15.0)
MCV: 88.6 fL (ref 78.0–100.0)
Platelets: 175 10*3/uL (ref 150–400)
RBC: 3.24 MIL/uL — ABNORMAL LOW (ref 3.87–5.11)
WBC: 9.6 10*3/uL (ref 4.0–10.5)

## 2011-03-03 LAB — COMPREHENSIVE METABOLIC PANEL
ALT: 14 U/L (ref 0–35)
AST: 20 U/L (ref 0–37)
Albumin: 2.6 g/dL — ABNORMAL LOW (ref 3.5–5.2)
Alkaline Phosphatase: 81 U/L (ref 39–117)
Chloride: 102 mEq/L (ref 96–112)
GFR calc Af Amer: >60 mL/min (ref >60)
Potassium: 4.2 mEq/L (ref 3.5–5.1)
Total Bilirubin: 0.2 mg/dL — ABNORMAL LOW (ref 0.3–1.2)

## 2011-03-03 LAB — GLUCOSE, CAPILLARY
Glucose-Capillary: 174 mg/dL — ABNORMAL HIGH (ref 70–99)
Glucose-Capillary: 152 mg/dL — ABNORMAL HIGH (ref 70–99)

## 2011-03-03 LAB — DIFFERENTIAL
Basophils Relative: 0 % (ref 0–1)
Eosinophils Absolute: 0.1 10*3/uL (ref 0.0–0.7)
Lymphs Abs: 1.9 10*3/uL (ref 0.7–4.0)
Neutro Abs: 6.4 10*3/uL (ref 1.7–7.7)
Neutrophils Relative %: 67 % (ref 43–77)

## 2011-03-04 LAB — COMPREHENSIVE METABOLIC PANEL
AST: 18 U/L (ref 0–37)
Albumin: 2.5 g/dL — ABNORMAL LOW (ref 3.5–5.2)
Chloride: 100 mEq/L (ref 96–112)
Creatinine, Ser: 0.76 mg/dL (ref 0.4–1.2)
GFR calc Af Amer: >60 mL/min (ref >60)
Total Bilirubin: 0.2 mg/dL — ABNORMAL LOW (ref 0.3–1.2)

## 2011-03-04 LAB — DIFFERENTIAL
Basophils Relative: 0 % (ref 0–1)
Eosinophils Absolute: 0.2 10*3/uL (ref 0.0–0.7)
Monocytes Relative: 10 % (ref 3–12)
Neutrophils Relative %: 68 % (ref 43–77)

## 2011-03-04 LAB — CBC
MCH: 29.6 pg (ref 26.0–34.0)
Platelets: 200 10*3/uL (ref 150–400)
RBC: 3.24 MIL/uL — ABNORMAL LOW (ref 3.87–5.11)
WBC: 10.6 10*3/uL — ABNORMAL HIGH (ref 4.0–10.5)

## 2011-03-04 LAB — GLUCOSE, CAPILLARY
Glucose-Capillary: 152 mg/dL — ABNORMAL HIGH (ref 70–99)
Glucose-Capillary: 180 mg/dL — ABNORMAL HIGH (ref 70–99)
Glucose-Capillary: 187 mg/dL — ABNORMAL HIGH (ref 70–99)

## 2011-03-17 ENCOUNTER — Encounter: Payer: Self-pay | Admitting: Cardiology

## 2011-03-25 NOTE — H&P (Signed)
[name], ENGBLOM            ACCOUNT NO.:  [id number]  MEDICAL RECORD NO.:  [id number]           PATIENT TYPE:  I  LOCATION:  A334                          FACILITY:  [facility]  PHYSICIAN:  Chelsea Primus, MD      DATE OF BIRTH:  [birth year]  DATE OF ADMISSION:  02/20/2011 DATE OF DISCHARGE:  LH                             HISTORY & PHYSICAL   CHIEF COMPLAINT:  Nausea, vomiting, and abdominal pain.  HISTORY OF PRESENT ILLNESS:  The patient is a 65 year old female with a history of hypertension, coronary artery disease, atherosclerotic vascular disease, congestive heart failure, prior hysterectomy, who presented with several days of increasing abdominal pain, distention, nausea and vomiting.  She states that she has had similar symptomatology with the past bowel obstructions.  She has had multiple episodes of nonbloody emesis.  She had some subjective fevers and chills.  She has noted diminished bowel function, her last bowel movement being approximately 5 days ago.  She has not had any flatus in the last 24 hours.  Her last bowel movement that was reported as normal.  No melena. No hematochezia.  She has had no significant weight changes.  No weight loss.  No weight gain.  PAST MEDICAL HISTORY:  Hypertension, coronary artery disease.  PAST SURGICAL HISTORY:  Hysterectomy and cesarean section.  She has had previous left shoulder surgery.  She has had a dual-chamber defibrillator and pacemaker placed in December of last year.  MEDICATIONS: 1. Losartan. 2. Carvedilol. 3. Digoxin. 4. Colace. 5. Lantus. 6. Spironolactone. 7. Nitroglycerin as needed. 8. Aspirin.  ALLERGIES:  HYDROCODONE, which caused hallucinations.  SOCIAL HISTORY:  No tobacco, no alcohol, no recreational drug abuse.  FAMILY HISTORY:  Noncontributory.  REVIEW OF SYSTEMS:  CONSTITUTIONAL:  Unremarkable.  EYES:  Unremarkable. EARS, NOSE, AND THROAT:  Unremarkable.  RESPIRATORY:   Unremarkable. CARDIOVASCULAR:  No recent palpitations or chest pain. GASTROINTESTINAL:  As per HPI.  GENITOURINARY:  Unremarkable. MUSCULOSKELETAL, NEURO, ENDOCRINE, AND SKIN:  All unremarkable.  PHYSICAL EXAMINATION:  VITAL SIGNS:  Temperature 98.6, heart rate 74, respiratory rate 18, blood pressure 148/84, 98% O2 saturation on room air. GENERAL:  She appears uncomfortable.  She is not in any acute distress. She is alert and oriented x3.  She is seated in the hospital bed.  She does have nasogastric tube in, which is bringing some secretions out. She is somewhat still due to the irritation from the tube, but again does not appear in any acute distress. HEENT:  Scalp:  No deformities or masses.  Eyes:  Pupils equal, round, reactive.  Extraocular movements are intact.  No scleral icterus. Conjunctival pallor is noted.  Oral mucosa pink.  Normal occlusion. NECK:  Trachea is midline.  No cervical lymphadenopathy. PULMONARY:  Unlabored respiration.  She is clear to auscultation. CARDIOVASCULAR:  Regular rate and rhythm.  No murmurs, rubs, or gallops. She has 2+ radial pulses bilaterally. ABDOMEN:  Diminished bowel sounds.  Abdomen is soft, but distended.  She has some mild diffuse abdominal tenderness.  No peritoneal signs.  No hernias or masses are apparent. EXTREMITIES:  Warm and dry.  PERTINENT LABORATORY AND RADIOGRAPHIC STUDIES:  CBC within normal limits.  Basic metabolic panel, she has an elevated creatinine at 2.2, BUN elevated at 64.  All other electrolytes were within normal limits. CT of abdomen and pelvis demonstrates no free air or free fluid.  There is some distended loops of small intestine in distal ileum.  Ileal loops are of normal caliber and decompressed.  No clear transition point is noted, although the transition appears to occur within the patient's pelvis.  There is stool and gas noted within the colon.  ASSESSMENT AND PLAN:  Small bowel obstruction.  At this  point, the patient will be continued n.p.o. status, continued on IV fluid hydration, and continued on nasogastric decompression.  Indications for emergent surgical intervention were discussed at length with the patient.  At this point, we will continue conservative management. Indication for nonemergent surgical interventions were also discussed including persistence of her symptomatology without resolution with conservative management.  She understands the plan and does wish to proceed.  We will continue to monitor the patient during her hospitalization.     Chelsea Primus, MD     BZ/MEDQ  D:  02/24/2011  T:  02/25/2011  Job:  [id number]  Electronically Signed by Chelsea Primus MD on 03/25/2011 11:56:35 AM

## 2011-04-07 ENCOUNTER — Ambulatory Visit (INDEPENDENT_AMBULATORY_CARE_PROVIDER_SITE_OTHER): Payer: Managed Care, Other (non HMO) | Admitting: Cardiology

## 2011-04-07 ENCOUNTER — Encounter: Payer: Self-pay | Admitting: *Deleted

## 2011-04-07 ENCOUNTER — Encounter: Payer: Self-pay | Admitting: Cardiology

## 2011-04-07 ENCOUNTER — Other Ambulatory Visit (HOSPITAL_COMMUNITY): Payer: Self-pay | Admitting: Family Medicine

## 2011-04-07 VITALS — BP 193/80 | HR 60 | Ht 61.0 in | Wt 152.0 lb

## 2011-04-07 DIAGNOSIS — I5022 Chronic systolic (congestive) heart failure: Secondary | ICD-10-CM

## 2011-04-07 DIAGNOSIS — E232 Diabetes insipidus: Secondary | ICD-10-CM

## 2011-04-07 DIAGNOSIS — L814 Other melanin hyperpigmentation: Secondary | ICD-10-CM

## 2011-04-07 DIAGNOSIS — I429 Cardiomyopathy, unspecified: Secondary | ICD-10-CM

## 2011-04-07 DIAGNOSIS — I10 Essential (primary) hypertension: Secondary | ICD-10-CM

## 2011-04-07 DIAGNOSIS — M21612 Bunion of left foot: Secondary | ICD-10-CM

## 2011-04-07 MED ORDER — LOSARTAN POTASSIUM 50 MG PO TABS: 50.0000 mg | ORAL_TABLET | Freq: Two times a day (BID) | ORAL | Status: DC

## 2011-04-07 NOTE — Progress Notes (Signed)
Clinical Summary [name] is a 65 y.o.female presenting for followup. I saw her last in November of 2011. In the interim she underwent defibrillator placement for CRT-D therapy by Dr. Caryl Comes, saw him in the office in February, now follows in [place] for device checks.  Medical records indicate interval hospital admission with small bowel obstruction, status post exploratory laparotomy with lysis of adhesions by Dr. Geroge Baseman on May 30.  Lab work from May 30 showed hemoglobin 9.6, platelets 200, sodium 137, potassium 4.2, BUN 12, creatinine 0.7.  She states of stable appetite, actually significant weight gain of approximately 20 pounds over a period of several months. No progressive orthopnea, PND, dyspnea on exertion, now at NYHA class II. No exertional chest pain. She reports compliance with medications, did not take her morning Cozaar however. She is not exercising regularly at this time. We discussed her diet, and also a walking regimen.   Allergies  Allergen Reactions  . Hydrocodone-Acetaminophen     Current outpatient prescriptions:aspirin 81 MG tablet, Take 81 mg by mouth daily.  , Disp: , Rfl: ;  carvedilol (COREG) 12.5 MG tablet, Take 1 tablet (12.5 mg total) by mouth 2 (two) times daily., Disp: 180 tablet, Rfl: 3;  Cholecalciferol (VITAMIN D3) 1000000 UNIT/GM LIQD, by Does not apply route daily.  , Disp: , Rfl: ;  digoxin (LANOXIN) 0.25 MG tablet, Take 0.125 mg by mouth daily.  , Disp: , Rfl:  docusate sodium (COLACE) 100 MG capsule, Take 100 mg by mouth as needed.  , Disp: , Rfl: ;  insulin glargine (LANTUS) 100 UNIT/ML injection, Inject 40 Units into the skin daily.  , Disp: , Rfl: ;  losartan (COZAAR) 50 MG tablet, Take 1 tablet (50 mg total) by mouth 2 (two) times daily. Take 1 tablet  in am and 2 tablets in pm, Disp: 180 tablet, Rfl: 0 nitroGLYCERIN (NITROSTAT) 0.4 MG SL tablet, Place 0.4 mg under the tongue every 5 (five) minutes as needed.  , Disp: , Rfl: ;  spironolactone  (ALDACTONE) 25 MG tablet, Take 1 tablet (25 mg total) by mouth daily., Disp: 90 tablet, Rfl: 3;  DISCONTD: losartan (COZAAR) 25 MG tablet, Take 1 tablet (25 mg total) by mouth as directed. Take 1 tablet  in am and 2 tablets in pm, Disp: 90 tablet, Rfl: 3  Past Medical History  Diagnosis Date  . Diabetes mellitus, type 2   . Essential hypertension, benign   . Chronic systolic heart failure   . Coronary atherosclerosis of native coronary artery     Nonobstructive  . Nonischemic cardiomyopathy     LVEF 20%    Past Surgical History  Procedure Date  . Abdominal hysterectomy   . Left shoulder surgery   . Breast lumpectomy   . Cardiac defibrillator placement     Medtronic [id number]   . Exploratory laparotomy with lysis of adhesions     Family History  Problem Relation Age of Onset  . Cirrhosis Father   . Cancer Mother     Ovarian    Social History [name] reports that she has never smoked. She has never used smokeless tobacco. [name] reports that she does not drink alcohol.  Review of Systems No palpitations or dizziness. No device discharges or syncope. Otherwise negative.  Physical Examination Filed Vitals:   04/07/11 0817  BP: 193/80  Pulse: 60   Overweight woman in no acute distress.  HEENT: Conjunctiva and lids normal, oropharynx clear.  Neck: Supple, JVP 8 cm water, no  carotid bruits, no thyromegaly.  Lungs: Clear with diminished breath sounds, nonlabored.  Cardiac: Indistinct PMI, regular rate and rhythm, paradoxically split S2, no S3.  Thorax: Well-healed device pocket site on the upper left. Abdomen: Soft, nontender, no hepatomegaly, bowel sounds present.  Extremities: Trace ankle edema, distal pulses one plus.  Skin: Warm and dry. Neuropsychiatric: Alert and oriented x3, affect appropriate.  ECG Paced ventricular rhythm at 62 beats per minute.  Studies Echocardiogram 08/19/2010: - Left ventricle: The cavity size was mildly dilated. There was  mild  to moderate concentric hypertrophy. Systolic function was severely  reduced. The estimated ejection fraction was in the range of 15%  to 20%.  - Ventricular septum: Septal motion showed paradox.  - Aortic valve: Mildly calcified annulus. Trileaflet; moderately  thickened, moderately calcified leaflets. Cusp separation was  mildly reduced. Trivial, if any aorticstenosis. Valve area:  1.02cm 2(VTI). Valve area: 1.22cm 2 (Vmax).  - Left atrium: The atrium was mildly to moderately dilated.  - Atrial septum: No defect or patent foramen ovale was identified.  - Pulmonary arteries: Systolic pressure was mildly increased. PA  peak pressure: 65mm Hg (S).  - Pericardium, extracardiac: Minimalpericardial effusion.   Problem List and Plan

## 2011-04-07 NOTE — Patient Instructions (Signed)
Your physician has recommended you make the following change in your medication: increase Cozaar to 50mg  twice daily  Your physician recommends that you return for lab work in: 2 weeks  Your physician recommends that you schedule a follow-up appointment in: 6 weeks

## 2011-04-07 NOTE — Assessment & Plan Note (Signed)
Symptomatically stable, status post CRT-D. Patient has gained a significant amount of weight since I last saw her, however this seems to be most related to her diet and lack of exercise. She reports no progressive shortness of breath, orthopnea, or lower extremity edema. We discussed diet and exercise, also plan to further advance Cozaar dosing. Closer followup will be arranged.

## 2011-04-07 NOTE — Assessment & Plan Note (Signed)
Blood pressure elevated today. Suspect this may be related to her weight gain overall. We discussed diet and exercise, sodium restriction, also advance Cozaar to 50 mg p.o. B.i.d. Followup BMET in 2 weeks.

## 2011-04-07 NOTE — Assessment & Plan Note (Signed)
Nonischemic cardiomyopathy, most recent echocardiogram results are noted above.

## 2011-04-09 ENCOUNTER — Ambulatory Visit (HOSPITAL_COMMUNITY)
Admission: RE | Admit: 2011-04-09 | Discharge: 2011-04-09 | Disposition: A | Discharge status: Home / Self Care | Payer: Managed Care, Other (non HMO) | Source: Ambulatory Visit | Attending: Family Medicine | Admitting: Family Medicine

## 2011-04-09 DIAGNOSIS — E119 Type 2 diabetes mellitus without complications: Secondary | ICD-10-CM | POA: Insufficient documentation

## 2011-04-09 DIAGNOSIS — E232 Diabetes insipidus: Secondary | ICD-10-CM

## 2011-04-09 DIAGNOSIS — I10 Essential (primary) hypertension: Secondary | ICD-10-CM | POA: Insufficient documentation

## 2011-04-09 DIAGNOSIS — L814 Other melanin hyperpigmentation: Secondary | ICD-10-CM

## 2011-04-09 DIAGNOSIS — M21612 Bunion of left foot: Secondary | ICD-10-CM

## 2011-04-09 DIAGNOSIS — L97509 Non-pressure chronic ulcer of other part of unspecified foot with unspecified severity: Secondary | ICD-10-CM | POA: Insufficient documentation

## 2011-04-09 DIAGNOSIS — I743 Embolism and thrombosis of arteries of the lower extremities: Secondary | ICD-10-CM | POA: Insufficient documentation

## 2011-04-13 ENCOUNTER — Other Ambulatory Visit: Payer: Self-pay

## 2011-04-13 ENCOUNTER — Telehealth: Payer: Self-pay

## 2011-04-13 NOTE — Telephone Encounter (Signed)
Patient just seen in the office on July 3. Cozaar was increased to 50 mg twice daily at that time. Would add hydralazine 25 mg p.o. T.i.d. Continue to check blood pressure. BMET already ordered.

## 2011-04-14 ENCOUNTER — Other Ambulatory Visit: Payer: Self-pay

## 2011-04-14 MED ORDER — HYDRALAZINE HCL 25 MG PO TABS: 25.0000 mg | ORAL_TABLET | Freq: Three times a day (TID) | ORAL | Status: DC

## 2011-04-14 MED ORDER — LOSARTAN POTASSIUM 50 MG PO TABS: 50.0000 mg | ORAL_TABLET | Freq: Two times a day (BID) | ORAL | Status: DC

## 2011-04-20 ENCOUNTER — Other Ambulatory Visit: Payer: Self-pay | Admitting: Cardiology

## 2011-04-20 DIAGNOSIS — N289 Disorder of kidney and ureter, unspecified: Secondary | ICD-10-CM

## 2011-04-21 LAB — BASIC METABOLIC PANEL
Glucose, Bld: 144 mg/dL — ABNORMAL HIGH (ref 70–99)
Potassium: 4 mEq/L (ref 3.5–5.3)
Sodium: 141 mEq/L (ref 135–145)

## 2011-04-22 NOTE — Discharge Summary (Signed)
  NAMEADDYSYN, FAGLIE            ACCOUNT NO.:  [id number]  MEDICAL RECORD NO.:  [id number]  LOCATION:  A336                          FACILITY:  [facility]  PHYSICIAN:  Chelsea Primus, MD      DATE OF BIRTH:  August 23, 1946  DATE OF ADMISSION:  02/20/2011 DATE OF DISCHARGE:  05/30/2012LH                              DISCHARGE SUMMARY   ADMISSION DIAGNOSIS:  Small bowel obstruction.  DISCHARGE DIAGNOSES: 1. Status post abdominal exploration, lysis of adhesions and     resolution of a small bowel obstruction. 2. Hypertension. 3. History of coronary artery disease.  DISPOSITION:  Home.  BRIEF HISTORY AND PHYSICAL:  Please see admission history and physical for the complete H and P.  The patient is a 65 year old female who presented with several days of increasing abdominal pain, nausea and vomiting.  Workup was consistent for a suspected small bowel obstruction.  She was admitted for continued management and intervention.  HOSPITAL COURSE:  The patient was admitted on Feb 20, 2011.  She was treated initially conservatively with nasogastric tube placement, bowel rest and IV fluid hydration.  Unfortunately, the patient had a slow progression with no resolution on conservative management and with no progression of her bowel function, was advised that the patient undergo exploratory laparotomy.  On Feb 25, 2011, she underwent an uneventful exploratory laparotomy with single band adhesion.  The remainder of the bowel was healthy and viable and after closure, she was allowed to stay in postanesthetic care unit.  She was then transferred back up to a surgical floor.  She did have a persistent postoperative ileus over the next subsequent hospital days, but again was continued on nasogastric decompression with decreasing drainage from the nasogastric tube and increasing flatus.  She was advanced to clears and eventually full liquids.  She tolerated this well, began to have bowel function with  a bowel movement and was advanced to a regular diet.  At this point she was transitioned to oral pain medications.  On Mar 04, 2011, the patient was tolerating a regular diet.  Her pain was controlled on oral pain medications and she was ambulatory and plans were made for discharge to home.  DISCHARGE INSTRUCTIONS:  The patient was instructed to increase activity as tolerated.  She is not to lift anything greater than 20 pounds for the next 4 weeks.  She is return to see me in the office in 3 weeks. She was instructed on continuing a low residual diet.  She is to call should she have any questions or concerns.  DISCHARGE MEDICATIONS:  Please see discharge reconciliation sheet for all discharge medications.     Chelsea Primus, MD     BZ/MEDQ  D:  03/31/2011  T:  04/01/2011  Job:  [id number]  Electronically Signed by Chelsea Primus MD on 04/22/2011 08:27:32 AM

## 2011-04-22 NOTE — Op Note (Signed)
[name], [name]            ACCOUNT NO.:  [id number]  MEDICAL RECORD NO.:  [id number]  LOCATION:  A336                          FACILITY:  [facility]  PHYSICIAN:  Chelsea Primus, MD      DATE OF BIRTH:  1946-01-21  DATE OF PROCEDURE:  02/25/2011 DATE OF DISCHARGE:  03/04/2011                              OPERATIVE REPORT   PREOPERATIVE DIAGNOSIS:  Small bowel obstruction.  POSTOPERATIVE DIAGNOSIS:  Band adhesion.  PROCEDURE:  Exploratory laparotomy, lysis of adhesions.  SURGEON:  Chelsea Primus, MD  ANESTHESIA:  General endotracheal, local anesthetic none.  SPECIMEN:  None.  ESTIMATED BLOOD LOSS:  Less than 150 mL.  FLUIDS:  1000 mL crystalloid.  DISPOSITION:  To postanesthetic care unit in stable condition.  BRIEF INDICATION:  The patient is a 65 year old female who presented to [facility] with signs and symptoms of bowel obstruction.  She was admitted for continued management and intervention.  Unfortunately,she did not progress with conservative management and on Feb 25, 2011, it was discussed with the patient proceeding to the operating room for exploratory laparotomy.  Risks, benefits, and alternatives were discussed at length with the patient.  Her questions and concerns addressed.  The patient was consented for planned procedure.  OPERATION IN DETAILS:  The patient was taken to the operative room, was placed in supine position on the operating room table at which time general anesthetic was administered.  Once the patient was asleep, she was endotracheally made by Anesthesia.  At this point, Foley catheter was placed in standard sterile fashion by the operating room staff.  Her abdomen was prepped with DuraPrep solution, draped in standard fashion. A midline incision was created over the previous midline incisional scar.  Additional dissection down to subcuticular tissue including division of fascia was carried out using electrocautery.  Upon  entering into the peritoneal cavity., minimal amount of serous fluid was encountered.  This was not turbid.  There was no odor.  At this time the peritoneum was opened both superiorly and inferiorly.  There were not significant amount adhesions and the band adhesions were taken down sharply as well as electrocautery.  Down to the pelvis there was noted to be a single band adhesion.  Upon leaving this, the small bowel was freed in its entirety.  This clearly was the area of transition as there was some inflammatory changes around this area of the small bowel but again the bowel appeared viable.  Good peristalsis was noted and not seeing any additional areas of problems from terminal ileum to the ligament of Treitz.  I then milked back some of the small enteric contents back to the stomach with good return to the nasogastric tube. At this point the attention was turned to closure.  Using a piece of Seprafilm was placed into the wound, a 0 loop Novafil x2 was utilized to reapproximate the fascia.  One was started superiorly, brought to the midportion of the incision, second one was started inferiorly, brought in after completion of the first one.  These were secured to each other.  The subcuticular tissue was irrigated and the skin edges were reapproximated with skin staples.  At this point, a sterile dressing  was placed.  Drapes were removed.  Dressing was secured.  The patient was allowed to come out of general anesthetic, was transferred back to regular hospital bed in stable condition.  At the conclusion of procedure, all instrument, sponge, and needle counts were correct.  The patient tolerated the procedure extremely well.     Chelsea Primus, MD     BZ/MEDQ  D:  03/31/2011  T:  04/01/2011  Job:  [id number]  Electronically Signed by Chelsea Primus MD on 04/22/2011 08:27:34 AM

## 2011-04-23 ENCOUNTER — Encounter: Payer: Self-pay | Admitting: *Deleted

## 2011-04-23 ENCOUNTER — Telehealth: Payer: Self-pay | Admitting: *Deleted

## 2011-04-23 NOTE — Progress Notes (Signed)
Addended by: Johnnette Barrios on: 04/23/2011 04:51 PM   Modules accepted: Orders

## 2011-04-23 NOTE — Telephone Encounter (Signed)
Message copied by Johnnette Barrios on Thu Apr 23, 2011  4:42 PM ------      Message from: MCDOWELL, Aloha Gell      Created: Thu Apr 23, 2011 12:52 PM       Reviewed. Potassium and creatinine are normal, however BUN has increased. Only diuretic at this time is spironolactone. Please decrease spironolactone to 12.5 mg daily, also check CBC.

## 2011-04-27 ENCOUNTER — Other Ambulatory Visit: Payer: Self-pay

## 2011-04-27 ENCOUNTER — Telehealth: Payer: Self-pay

## 2011-04-27 MED ORDER — LOSARTAN POTASSIUM 50 MG PO TABS: 50.0000 mg | ORAL_TABLET | Freq: Two times a day (BID) | ORAL | Status: DC

## 2011-04-30 ENCOUNTER — Encounter: Payer: Self-pay | Admitting: Cardiovascular Disease

## 2011-04-30 ENCOUNTER — Encounter: Payer: Self-pay | Admitting: Cardiology

## 2011-04-30 ENCOUNTER — Ambulatory Visit (INDEPENDENT_AMBULATORY_CARE_PROVIDER_SITE_OTHER): Payer: Managed Care, Other (non HMO) | Admitting: Cardiovascular Disease

## 2011-04-30 VITALS — BP 187/76 | HR 63 | Ht 61.0 in | Wt 154.0 lb

## 2011-04-30 DIAGNOSIS — I7389 Other specified peripheral vascular diseases: Secondary | ICD-10-CM

## 2011-04-30 DIAGNOSIS — I739 Peripheral vascular disease, unspecified: Secondary | ICD-10-CM

## 2011-04-30 DIAGNOSIS — Z01810 Encounter for preprocedural cardiovascular examination: Secondary | ICD-10-CM

## 2011-04-30 LAB — CBC WITH DIFFERENTIAL/PLATELET
Basophils Absolute: 0 10*3/uL (ref 0.0–0.1)
HCT: 29.1 % — ABNORMAL LOW (ref 36.0–46.0)
Lymphs Abs: 1.9 10*3/uL (ref 0.7–4.0)
Monocytes Absolute: 0.7 10*3/uL (ref 0.1–1.0)
Monocytes Relative: 8.2 % (ref 3.0–12.0)
Platelets: 165 10*3/uL (ref 150.0–400.0)
RDW: 15.4 % — ABNORMAL HIGH (ref 11.5–14.6)

## 2011-04-30 LAB — BASIC METABOLIC PANEL
BUN: 31 mg/dL — ABNORMAL HIGH (ref 6–23)
Calcium: 8.8 mg/dL (ref 8.4–10.5)
Creatinine, Ser: 0.9 mg/dL (ref 0.4–1.2)
GFR: 81.97 mL/min (ref >60.00)
Glucose, Bld: 140 mg/dL — ABNORMAL HIGH (ref 70–99)
Potassium: 3.9 mEq/L (ref 3.5–5.1)

## 2011-04-30 LAB — PROTIME-INR: Prothrombin Time: 12.7 s — ABNORMAL HIGH (ref 10.2–12.4)

## 2011-04-30 NOTE — Assessment & Plan Note (Signed)
Non-invasive studies suggestive of severe PAD. She has a slowly healing ulceration on the dorsum of the left foot. I think an angiogram is indicated to define her disease. Will arrange for Wednesday August 1st at [facility]. Risks and benefits reviewed with pt. She agrees to proceed. Will perform distal aortogram with bilateral lower ext runoff with possible PTA. Labs today. Continue antibiotics per Dr. Everette Rank.

## 2011-04-30 NOTE — Patient Instructions (Signed)
Your physician recommends that you schedule a follow-up appointment in: 3 weeks  Your physician has requested that you have a peripheral vascular angiogram. This exam is performed at the hospital. During this exam IV contrast is used to look at arterial blood flow. Please review the information sheet given for details.

## 2011-04-30 NOTE — Progress Notes (Signed)
History of Present Illness:65 yo AAF with history of mild CAD, Non-ischemic cardiomyopathy s/p ICD, HTN, DM here today for PV consult. Her cardiac issues are followed by Dr. [name]. She was recently seen by Dr. Everette Rank in primary care and mentioned a non-healing ulcer on the dorsum of her left foot at the base of her left great toe. She has been on doxycycline per Dr. Everette Rank. Non-invasive studies at [facility] 04/09/11 with ABI of 0.48 on the left and 0.61 on the right. She is here today for evaluation. The ulcer on her left foot began several weeks ago. This started as an area of cracked skin. No fever, chills. No claudication symptoms. No rest pain.   Past Medical History  Diagnosis Date  . Diabetes mellitus, type 2   . Essential hypertension, benign   . Chronic systolic heart failure   . Coronary atherosclerosis of native coronary artery     Nonobstructive  . Nonischemic cardiomyopathy     LVEF 20%  . PAD (peripheral artery disease)     Past Surgical History  Procedure Date  . Abdominal hysterectomy   . Left shoulder surgery   . Breast lumpectomy   . Cardiac defibrillator placement     Medtronic [id number]   . Exploratory laparotomy with lysis of adhesions     Current Outpatient Prescriptions  Medication Sig Dispense Refill  . aspirin 81 MG tablet Take 81 mg by mouth daily.        . carvedilol (COREG) 12.5 MG tablet Take 1 tablet (12.5 mg total) by mouth 2 (two) times daily.  180 tablet  3  . Cholecalciferol (VITAMIN D3) 1000000 UNIT/GM LIQD by Does not apply route daily.        . digoxin (LANOXIN) 0.25 MG tablet Take 0.125 mg by mouth daily.        Marland Kitchen docusate sodium (COLACE) 100 MG capsule Take 100 mg by mouth as needed.        . doxycycline (DORYX) 100 MG DR capsule Take 100 mg by mouth 2 (two) times daily.        . hydrALAZINE (APRESOLINE) 25 MG tablet Take 1 tablet (25 mg total) by mouth 3 (three) times daily.  180 tablet  0  . insulin glargine (LANTUS) 100 UNIT/ML injection Inject 40  Units into the skin daily.        Marland Kitchen losartan (COZAAR) 50 MG tablet Take 1 tablet (50 mg total) by mouth 2 (two) times daily.  60 tablet  0  . nitroGLYCERIN (NITROSTAT) 0.4 MG SL tablet Place 0.4 mg under the tongue every 5 (five) minutes as needed.        Marland Kitchen spironolactone (ALDACTONE) 25 MG tablet 25 mg. 1/2 tab daily         Allergies  Allergen Reactions  . Hydrocodone-Acetaminophen     History   Social History  . Marital Status: Divorced    Spouse Name: N/A    Number of Children: N/A  . Years of Education: N/A   Occupational History  . Not on file.   Social History Main Topics  . Smoking status: Never Smoker   . Smokeless tobacco: Never Used   Comment: tobacco use-no  . Alcohol Use: No  . Drug Use: No  . Sexually Active: Not on file   Other Topics Concern  . Not on file   Social History Narrative  . No narrative on file    Family History  Problem Relation Age of Onset  . Cirrhosis Father   .  Cancer Mother     Ovarian    Review of Systems:  As stated in the HPI and otherwise negative.   BP 187/76  Pulse 63  Ht 5\' 1"  (1.549 m)  Wt 154 lb (69.854 kg)  BMI 29.10 kg/m2  Physical Examination: General: Well developed, well nourished, NAD HEENT: OP clear, mucus membranes moist SKIN: warm, dry. No rashes. Neuro: No focal deficits Musculoskeletal: Muscle strength 5/5 all ext Psychiatric: Mood and affect normal Neck: No JVD, no carotid bruits, no thyromegaly, no lymphadenopathy. Lungs:Clear bilaterally, no wheezes, rhonci, crackles Cardiovascular: Regular rate and rhythm. No murmurs, gallops or rubs. Abdomen:Soft. Bowel sounds present. Non-tender.  Extremities: Trace left lower ext edema. Open ulceration dorsum left foot. No erythema or discharge. Foot warm. No ulcerations right lower ext.    Arterial doppler 04/09/11:  At rest, the right ABI is 0.61, left 0.48. Noncompressible proximal segments limit segmental assessment. Monophasic waveforms are noted  throughout both lower extremities on Doppler interrogation. Pulse volume recording is mildly attenuated bilaterally. Exercise testing was not performed.  IMPRESSION:  1. Moderately severe bilateral lower extremity arterial occlusive disease rest, left worse than right. Should the patient fail conservative treatment, consider MRA runoff (preferred over CT due to presumed vascular calcification) to better define the site and nature of arterial occlusive disease and delineate treatment options.

## 2011-05-04 ENCOUNTER — Other Ambulatory Visit: Payer: Self-pay | Admitting: Cardiology

## 2011-05-05 LAB — CBC WITH DIFFERENTIAL/PLATELET
Basophils Absolute: 0 10*3/uL (ref 0.0–0.1)
Basophils Relative: 0 % (ref 0–1)
Eosinophils Absolute: 0.2 10*3/uL (ref 0.0–0.7)
Eosinophils Relative: 2 % (ref 0–5)
HCT: 32.3 % — ABNORMAL LOW (ref 36.0–46.0)
Hemoglobin: 9.9 g/dL — ABNORMAL LOW (ref 12.0–15.0)
Lymphocytes Relative: 26 % (ref 12–46)
Lymphs Abs: 2 10*3/uL (ref 0.7–4.0)
MCH: 28.4 pg (ref 26.0–34.0)
MCHC: 30.7 g/dL (ref 30.0–36.0)
MCV: 92.6 fL (ref 78.0–100.0)
Monocytes Absolute: 0.7 10*3/uL (ref 0.1–1.0)
Monocytes Relative: 9 % (ref 3–12)
Neutro Abs: 5 10*3/uL (ref 1.7–7.7)
Neutrophils Relative %: 63 % (ref 43–77)
Platelets: 196 10*3/uL (ref 150–400)
RBC: 3.49 MIL/uL — ABNORMAL LOW (ref 3.87–5.11)
RDW: 15.3 % (ref 11.5–15.5)
WBC: 7.9 10*3/uL (ref 4.0–10.5)

## 2011-05-05 LAB — BASIC METABOLIC PANEL
Calcium: 9.2 mg/dL (ref 8.4–10.5)
Sodium: 141 mEq/L (ref 135–145)

## 2011-05-06 ENCOUNTER — Ambulatory Visit (HOSPITAL_COMMUNITY)
Admission: RE | Admit: 2011-05-06 | Discharge: 2011-05-06 | Disposition: A | Discharge status: Home / Self Care | Payer: Managed Care, Other (non HMO) | Source: Ambulatory Visit | Attending: Cardiovascular Disease | Admitting: Cardiovascular Disease

## 2011-05-06 DIAGNOSIS — I10 Essential (primary) hypertension: Secondary | ICD-10-CM | POA: Insufficient documentation

## 2011-05-06 DIAGNOSIS — I428 Other cardiomyopathies: Secondary | ICD-10-CM | POA: Insufficient documentation

## 2011-05-06 DIAGNOSIS — I701 Atherosclerosis of renal artery: Secondary | ICD-10-CM | POA: Insufficient documentation

## 2011-05-06 DIAGNOSIS — I70219 Atherosclerosis of native arteries of extremities with intermittent claudication, unspecified extremity: Secondary | ICD-10-CM

## 2011-05-06 DIAGNOSIS — I739 Peripheral vascular disease, unspecified: Secondary | ICD-10-CM | POA: Insufficient documentation

## 2011-05-06 DIAGNOSIS — L97509 Non-pressure chronic ulcer of other part of unspecified foot with unspecified severity: Secondary | ICD-10-CM | POA: Insufficient documentation

## 2011-05-06 DIAGNOSIS — L98499 Non-pressure chronic ulcer of skin of other sites with unspecified severity: Secondary | ICD-10-CM | POA: Insufficient documentation

## 2011-05-06 DIAGNOSIS — E119 Type 2 diabetes mellitus without complications: Secondary | ICD-10-CM | POA: Insufficient documentation

## 2011-05-06 DIAGNOSIS — I7092 Chronic total occlusion of artery of the extremities: Secondary | ICD-10-CM | POA: Insufficient documentation

## 2011-05-06 HISTORY — PX: PR VEIN BYPASS GRAFT,AORTO-FEM-POP: 35551

## 2011-05-07 ENCOUNTER — Ambulatory Visit: Payer: Managed Care, Other (non HMO) | Admitting: Cardiovascular Disease

## 2011-05-07 ENCOUNTER — Encounter: Payer: Self-pay | Admitting: Internal Medicine

## 2011-05-07 NOTE — Procedures (Signed)
[name], HULTS            ACCOUNT NO.:  [id number]  MEDICAL RECORD NO.:  [id number]  LOCATION:  [facility]                         FACILITY:  [facility]  PHYSICIAN:  Lauree Chandler, MDDATE OF BIRTH:  1945-12-27  DATE OF PROCEDURE:  05/06/2011 DATE OF DISCHARGE:  05/06/2011                   PERIPHERAL VASCULAR INVASIVE PROCEDURE   PRIMARY CARE PHYSICIAN:  Angus G. McInnis, MD  PROCEDURE PERFORMED:  Distal aortogram with bilateral lower extremity runoff.  OPERATOR:  Lauree Chandler, MD  INDICATIONS:  This is a pleasant 65 year old African American female with a history of diabetes mellitus, hypertension, nonobstructive coronary artery disease, nonischemic cardiomyopathy, and peripheral arterial disease who I was recently asked to see in the office because of abnormal vascular studies.  The patient has recently had a nonhealing ulceration on the dorsum of her left foot at the base of her left great toe.  Her noninvasive studies performed at [facility] on April 09, 2011 showed an ABI of 0.48 on the left and 0.61 on the right.  I saw the patient in the office and recommended we perform a distal aortogram to better define her circulation.  The patient was in agreement with this plan.  The angiogram was arranged for today.  DETAILS OF PROCEDURE:  The patient was brought into the main peripheral vascular laboratory after signing informed consent for the procedure. The right groin was prepped and draped in sterile fashion.  Lidocaine 1% was used for local anesthesia.  A 5-French sheath was inserted into the right femoral artery without difficulty.  A pigtail catheter was advanced over a wire into the distal aorta.  We then performed angiography of the distal aorta visualizing both renal arteries.  We then pulled the catheter back to the level of the aortic bifurcation and performed angiography of the pelvic vessels.  We then performed runoff to both lower extremities  to both feet.  The patient tolerated the procedure well.  She was taken to the recovery area in stable condition.  HEMODYNAMIC FINDINGS:  Central aortic pressure 194/66.  ANGIOGRAPHIC FINDINGS: 1. The distal aorta had no obstructive disease and no aneurysms. 2. The right renal artery had mild 40% stenosis. 3. The left renal artery did not have any obstructive disease. 4. The left common iliac artery had a mild 20% plaque.  Left external     iliac artery, internal iliac artery, and common femoral artery were     patent.  The left superficial femoral artery was patent with     moderate plaque in the distal portion.  The left popliteal artery     is patent.  All three blood vessels below the knee were totally     occluded.  There was extensive collateral network of vessels     supplying the foot. 5. The right common iliac artery, external iliac artery, internal     iliac artery, and common femoral artery were patent with no     disease.  The right superficial femoral artery has moderate plaque     throughout the proximal segment.  The distal superficial femoral     artery has a 95% stenosis.  The right popliteal artery has a 100%     chronic total occlusion.  There is one-vessel  runoff to the right     foot.  IMPRESSION:  Severe peripheral arterial disease with nonhealing ulceration on the left foot.  RECOMMENDATIONS:  This patient does not have any good percutaneous options for revascularization of her left lower extremity.  I will let her go home today and will discuss her case with one of our vascular surgeons.  I do not know if there are any good surgical options in this patient with extensive disease.  Unfortunately, I am afraid that the patient may end up having to have an amputation if she cannot heel the wound on her foot.  We will discuss further and discuss with the patient.     Lauree Chandler, MD     CM/MEDQ  D:  05/06/2011  T:  05/06/2011  Job:   [id number]  cc:   Angus G. Everette Rank, MD  Electronically Signed by Lauree Chandler MD on 05/07/2011 12:33:07 AM

## 2011-05-11 ENCOUNTER — Telehealth: Payer: Self-pay | Admitting: Cardiovascular Disease

## 2011-05-11 ENCOUNTER — Telehealth: Payer: Self-pay | Admitting: Cardiology

## 2011-05-11 DIAGNOSIS — I7389 Other specified peripheral vascular diseases: Secondary | ICD-10-CM

## 2011-05-11 NOTE — Telephone Encounter (Signed)
Order put in for referral to vein and vascular.

## 2011-05-11 NOTE — Telephone Encounter (Signed)
Tried to call pt and left message.

## 2011-05-11 NOTE — Telephone Encounter (Signed)
Left message with pt. She will need to be referred to Dr. Annamarie Major with [facility] in the next week.She has a non-healing ulcer on her left foot and has severe disease in the below the knee vessels of the left leg. Can we arrange an appt with Dr. Trula Slade for this week or next week? Thanks, chris

## 2011-05-18 ENCOUNTER — Other Ambulatory Visit: Payer: Self-pay

## 2011-05-18 MED ORDER — DIGOXIN 250 MCG PO TABS: 0.1250 mg | ORAL_TABLET | Freq: Every day | ORAL | Status: DC

## 2011-05-18 MED ORDER — SPIRONOLACTONE 25 MG PO TABS: 25.0000 mg | ORAL_TABLET | Freq: Every day | ORAL | Status: DC

## 2011-05-18 NOTE — Telephone Encounter (Addendum)
.   Requested Prescriptions   Pending Prescriptions Disp Refills  . digoxin (LANOXIN) 0.25 MG tablet 30 tablet 4    Sig: Take 0.5 tablets (0.125 mg total) by mouth daily.  . Requested Prescriptions   Pending Prescriptions Disp Refills  . digoxin (LANOXIN) 0.25 MG tablet 30 tablet 4    Sig: Take 0.5 tablets (0.125 mg total) by mouth daily.  Marland Kitchen spironolactone (ALDACTONE) 25 MG tablet 30 tablet 4    Sig: Take 1 tablet (25 mg total) by mouth daily. 1/2 tab daily    Sent to [facility] home delivery.

## 2011-05-19 ENCOUNTER — Encounter: Payer: Self-pay | Admitting: Vascular Surgery

## 2011-05-19 ENCOUNTER — Ambulatory Visit (INDEPENDENT_AMBULATORY_CARE_PROVIDER_SITE_OTHER): Payer: Managed Care, Other (non HMO) | Admitting: Vascular Surgery

## 2011-05-19 VITALS — BP 217/78 | HR 61 | Resp 16 | Ht 61.0 in | Wt 154.0 lb

## 2011-05-19 DIAGNOSIS — L98499 Non-pressure chronic ulcer of skin of other sites with unspecified severity: Secondary | ICD-10-CM

## 2011-05-19 DIAGNOSIS — I739 Peripheral vascular disease, unspecified: Secondary | ICD-10-CM

## 2011-05-19 NOTE — Progress Notes (Signed)
Subjective:     Patient ID: [name], female   DOB: 1946-03-02, 65 y.o.   MRN: [id number]  HPI The patient presents today for evaluation of gangrene in her left foot. This began as a skin tear over the dorsum of her left great toe. This has now progressed to tissue loss involving her first metatarsal head. He denies any pain associated with this. She was seen by Dr. Angelena Form with an ankle arm index of 0.84 on the left and 0.61 on the right. He performed an arteriogram on August 1 and I have reviewed her films. She has a near normal flow throughout her iliac system. Her left superficial femoral artery is widely patent with some mild narrowing of her adductor canal. Her popliteal artery is widely patent. He has complete occlusion of all 3 trifurcation vessels on the left and has distal reconstitution of her dorsalis pedis and posterior tibial artery. The posterior tibial is the largest vessel. He does have a history of congestive heart failure and pacemaker implantation. She has a long history of insulin-dependent diabetes.  Past Medical History  Diagnosis Date  . Diabetes mellitus, type 2   . Essential hypertension, benign   . Chronic systolic heart failure   . Coronary atherosclerosis of native coronary artery     Nonobstructive  . Nonischemic cardiomyopathy     LVEF 20%  . PAD (peripheral artery disease)   . CHF (congestive heart failure)   . Anxiety   . Depression   . Constipation   . Heart murmur   . Ulcer NONHEALING LEFT FOOT    History  Substance Use Topics  . Smoking status: Never Smoker   . Smokeless tobacco: Never Used   Comment: tobacco use-no  . Alcohol Use: No    Family History  Problem Relation Age of Onset  . Cirrhosis Father   . Cancer Mother     Ovarian  . Ovarian cancer Mother     Allergies  Allergen Reactions  . Hydrocodone-Acetaminophen     Current outpatient prescriptions:aspirin 81 MG tablet, Take 81 mg by mouth daily.  , Disp: , Rfl: ;   carvedilol (COREG) 12.5 MG tablet, Take 1 tablet (12.5 mg total) by mouth 2 (two) times daily., Disp: 180 tablet, Rfl: 3;  Cholecalciferol (VITAMIN D3) 1000000 UNIT/GM LIQD, by Does not apply route daily.  , Disp: , Rfl: ;  digoxin (LANOXIN) 0.25 MG tablet, Take 0.5 tablets (0.125 mg total) by mouth daily., Disp: 30 tablet, Rfl: 4 doxycycline (DORYX) 100 MG DR capsule, Take 100 mg by mouth 2 (two) times daily.  , Disp: , Rfl: ;  hydrALAZINE (APRESOLINE) 25 MG tablet, Take 1 tablet (25 mg total) by mouth 3 (three) times daily., Disp: 180 tablet, Rfl: 0;  insulin glargine (LANTUS) 100 UNIT/ML injection, Inject 40 Units into the skin daily.  , Disp: , Rfl: ;  losartan (COZAAR) 50 MG tablet, Take 1 tablet (50 mg total) by mouth 2 (two) times daily., Disp: 60 tablet, Rfl: 0 nitroGLYCERIN (NITROSTAT) 0.4 MG SL tablet, Place 0.4 mg under the tongue every 5 (five) minutes as needed.  , Disp: , Rfl: ;  spironolactone (ALDACTONE) 25 MG tablet, Take 1 tablet (25 mg total) by mouth daily. 1/2 tab daily, Disp: 30 tablet, Rfl: 4;  docusate sodium (COLACE) 100 MG capsule, Take 100 mg by mouth as needed.  , Disp: , Rfl:   Filed Vitals:   05/19/11 1243  Height: 5\' 1"  (1.549 m)  Weight: 154 lb (  69.854 kg)    Body mass index is 29.10 kg/(m^2).       Review of Systems: ROS negative except for Review of Systems  Constitutional: Positive for fatigue.  Respiratory: Positive for chest tightness.   Cardiovascular: Positive for chest pain and leg swelling.  Psychiatric/Behavioral: Positive for dysphoric mood and agitation.       Objective:   Physical Exam Well-developed well-nourished black female appearing her stated age of 37. Blood pressure to 217/78. Heart rate 61. Respirations 16. HEENT normal. Chest clear bilaterally. Heart regular rate and rhythm without murmur. Left carotid bruit. Palpable radial femoral and popliteal pulses bilaterally absent pedal pulses bilaterally musculoskeletal no major deformities  or cyanosis. Neuro grossly intact. She has full-thickness skin loss into her first metatarsal head of her left first toe. She has edema in her left foot.  I independently reviewed her arteriogram from 74. I discussed this at length with the patient and her daughter present.    Assessment:     Critical limb ischemia left foot with severe tibial vessel occlusive disease    Plan:     I discussed options with the patient. One option would be primary a below-knee amputation. She should have adequate flow to heal this. She would be a candidate for left popliteal to posterior tibial bypass and ray amputation of left first toe. I explained with her current degree of tissue loss this would be borderline for healing. Feel this would be her only option for limb salvage and she wishes to proceed with this bypass. She is scheduled to see Dr. [name] on 8/20 and I have notified Dr. Myles Gip office of her planned surgery. She is scheduled for left popliteal to posterior tibial bypass on August 23. She understands there is still a significant risk of limb loss even with a patent bypass.

## 2011-05-20 ENCOUNTER — Telehealth: Payer: Self-pay

## 2011-05-22 ENCOUNTER — Other Ambulatory Visit: Payer: Self-pay | Admitting: *Deleted

## 2011-05-22 MED ORDER — CARVEDILOL 12.5 MG PO TABS: 12.5000 mg | ORAL_TABLET | Freq: Two times a day (BID) | ORAL | Status: DC

## 2011-05-23 ENCOUNTER — Other Ambulatory Visit: Payer: Self-pay | Admitting: *Deleted

## 2011-05-23 DIAGNOSIS — I10 Essential (primary) hypertension: Secondary | ICD-10-CM

## 2011-05-23 DIAGNOSIS — N289 Disorder of kidney and ureter, unspecified: Secondary | ICD-10-CM

## 2011-05-25 ENCOUNTER — Encounter: Payer: Self-pay | Admitting: Cardiology

## 2011-05-25 ENCOUNTER — Ambulatory Visit (INDEPENDENT_AMBULATORY_CARE_PROVIDER_SITE_OTHER): Payer: Managed Care, Other (non HMO) | Admitting: Cardiology

## 2011-05-25 ENCOUNTER — Other Ambulatory Visit: Payer: Self-pay

## 2011-05-25 ENCOUNTER — Telehealth: Payer: Self-pay | Admitting: Cardiology

## 2011-05-25 VITALS — BP 185/71 | HR 63 | Resp 18 | Ht 63.0 in | Wt 160.1 lb

## 2011-05-25 DIAGNOSIS — Z01818 Encounter for other preprocedural examination: Secondary | ICD-10-CM | POA: Insufficient documentation

## 2011-05-25 DIAGNOSIS — I429 Cardiomyopathy, unspecified: Secondary | ICD-10-CM

## 2011-05-25 DIAGNOSIS — I70209 Unspecified atherosclerosis of native arteries of extremities, unspecified extremity: Secondary | ICD-10-CM

## 2011-05-25 DIAGNOSIS — I5022 Chronic systolic (congestive) heart failure: Secondary | ICD-10-CM

## 2011-05-25 DIAGNOSIS — I10 Essential (primary) hypertension: Secondary | ICD-10-CM

## 2011-05-25 LAB — BASIC METABOLIC PANEL
CO2: 21 mEq/L (ref 19–32)
Glucose, Bld: 104 mg/dL — ABNORMAL HIGH (ref 70–99)
Potassium: 4.1 mEq/L (ref 3.5–5.3)
Sodium: 142 mEq/L (ref 135–145)

## 2011-05-25 MED ORDER — HYDRALAZINE HCL 50 MG PO TABS: 50.0000 mg | ORAL_TABLET | Freq: Three times a day (TID) | ORAL | Status: DC

## 2011-05-25 NOTE — Assessment & Plan Note (Signed)
Recently evaluated by Dr. Angelena Form and Dr. Donnetta Hutching as noted.

## 2011-05-25 NOTE — Assessment & Plan Note (Signed)
Nonischemic cardiomyopathy, prior LVEF 20%. She is status post CRT.-D therapy, to have device followup on Wednesday. Denies any device shocks or significant palpitations. Left bundle branch block at baseline.

## 2011-05-25 NOTE — Patient Instructions (Signed)
Your physician has recommended you make the following change in your medication: INCREASE HYDRALAZINE TO [place]  Your physician recommends that you return for lab work in: today  Your physician recommends that you schedule a follow-up appointment in: 6 weeks

## 2011-05-25 NOTE — Assessment & Plan Note (Signed)
Patient scheduled for a left popliteal to posterior tibial bypass with ray amputation of the left first toe, per Dr. Donnetta Hutching, related to severe peripheral arterial disease and limb ischemia. Patient plans to call Dr. Luther Parody office today with her final decision about proceeding on this Jan 27, 2023, reporting a recent death in her family and uncertain funeral plans that she needs to consider. From a cardiac perspective, she should be able to proceed at an acceptable perioperative risk, overall moderate, in light of her baseline cardiac status and evaluation within the last year. She has nonobstructive CAD, with no active angina, and relatively stable chronic heart failure symptoms with known nonischemic cardiomyopathy status post CRT-D therapy. She does need better blood pressure control, and medication adjustments are being made. She should keep her device followup visit on Wednesday for a baseline interrogation, and will need to have reinterrogation after her surgery by [employer]. I would also recommend that our cardiology service follow her during hospitalization at [facility] to assist with medication adjustments and volume management.

## 2011-05-25 NOTE — Telephone Encounter (Signed)
Needs refills and clarification on dosage on Carvedilol/tg

## 2011-05-25 NOTE — Assessment & Plan Note (Signed)
Patient reports NYHA class II symptoms. Continue medical therapy, advancing hydralazine as noted. Followup BMET and BNP also to be obtained. Diuretic regimen at this point includes Aldactone.

## 2011-05-25 NOTE — Assessment & Plan Note (Signed)
Increasing hydralazine to 50 mg p.o. T.i.d. Continue to work on weight loss and sodium restriction.

## 2011-05-25 NOTE — Progress Notes (Signed)
Clinical Summary Ms. Wheatcroft is a 65 y.o.female presenting for followup. I last saw her in July, and she has had subsequent visits with Dr. Angelena Form and Dr. Donnetta Hutching related to significant peripheral arterial disease with a poorly healing left foot ulcer and evidence of gangrene.  She has evidence of left foot ischemia related to severe tibial occlusive disease and is scheduled for a left popliteal to posterior tibial bypass and left first toe ray amputation with Dr. Donnetta Hutching later this week.  Recent elevated blood pressure measurements are noted.  She reports NYHA class II dyspnea on exertion and no anginal chest pain. Her weight has been increasing, although seems to be related to caloric intake rather than volume overload. She denies any orthopnea or PND, no progressive lower extremity edema. She reports compliance with her medications.  We already increased her Cozaar as of the last visit for blood pressure control. No followup BMET as yet. She is due for a device followup visit this Wednesday. She denies any palpitations or device discharges.  She tells me that she recently had a death in her family, suddenly over the weekend. [place] arrangements are not certain as yet. She is not sure whether she will have her surgery on Thursday or not, but plans to call Dr. Luther Parody office today with a decision.   Allergies  Allergen Reactions  . Hydrocodone-Acetaminophen     Medication list reviewed.  Past Medical History  Diagnosis Date  . Diabetes mellitus, type 2   . Essential hypertension, benign   . Chronic systolic heart failure   . Coronary atherosclerosis of native coronary artery     Nonobstructive  . Nonischemic cardiomyopathy     LVEF 20%  . PAD (peripheral artery disease)     Left foot gangrene  . CHF (congestive heart failure)   . Anxiety   . Depression   . Constipation   . Heart murmur     Past Surgical History  Procedure Date  . Abdominal hysterectomy   . Left shoulder  surgery   . Breast lumpectomy   . Cardiac defibrillator placement     [employer] [id number]   . Exploratory laparotomy with lysis of adhesions   . Bladder surgery 2011 BENIGN MASS REMOVED FROM BLADDER  . Eye surgery BILATERAL CATARACTS REMOVAL  10 YEARS AGO PER PATIENT    Family History  Problem Relation Age of Onset  . Cirrhosis Father   . Cancer Mother     Ovarian  . Ovarian cancer Mother     Social History Ms. Harpin reports that she has never smoked. She has never used smokeless tobacco. Ms. Akopyan reports that she does not drink alcohol.  Review of Systems No palpitations or syncope. Reports being anxious and under stress. States her appetite is better, eating more, but reports relative sodium restriction. Otherwise negative.  Physical Examination Filed Vitals:   05/25/11 0843  BP: 185/71  Pulse: 63  Resp: 18   Overweight woman in no acute distress.  HEENT: Conjunctiva and lids normal, oropharynx clear.  Neck: Supple, JVP 8 cm water, no carotid bruits, no thyromegaly.  Lungs: Clear with diminished breath sounds, nonlabored.  Cardiac: Indistinct PMI, regular rate and rhythm, paradoxically split S2, no S3.  Thorax: Well-healed device pocket site on the upper left.  Abdomen: Soft, nontender, no hepatomegaly, bowel sounds present.  Extremities: Trace ankle edema, left foot with hyperpigmentation and dry gangrenous changes of the forefoot, 2 ulcerations on dorsum and at base of great toe. No active drainage.  Skin: Warm and dry.  Neuropsychiatric: Alert and oriented x3, affect appropriate.    Problem List and Plan

## 2011-05-26 ENCOUNTER — Telehealth: Payer: Self-pay

## 2011-05-26 ENCOUNTER — Ambulatory Visit: Payer: Managed Care, Other (non HMO) | Admitting: Cardiovascular Disease

## 2011-05-26 LAB — BRAIN NATRIURETIC PEPTIDE: Brain Natriuretic Peptide: 300.8 pg/mL — ABNORMAL HIGH (ref 0.0–100.0)

## 2011-05-26 MED ORDER — CARVEDILOL 12.5 MG PO TABS: 12.5000 mg | ORAL_TABLET | Freq: Two times a day (BID) | ORAL | Status: DC

## 2011-05-26 NOTE — Telephone Encounter (Signed)
.   Requested Prescriptions   Signed Prescriptions Disp Refills  . carvedilol (COREG) 12.5 MG tablet 180 tablet 3    Sig: Take 1 tablet (12.5 mg total) by mouth 2 (two) times daily.    Authorizing Provider: MCDOWELL, Aloha Gell    Ordering User: Laurence Compton

## 2011-05-27 ENCOUNTER — Encounter: Payer: Self-pay | Admitting: Internal Medicine

## 2011-05-27 ENCOUNTER — Other Ambulatory Visit: Payer: Self-pay | Admitting: Vascular Surgery

## 2011-05-27 ENCOUNTER — Encounter (HOSPITAL_COMMUNITY)
Admission: RE | Admit: 2011-05-27 | Discharge: 2011-05-27 | Disposition: A | Discharge status: Home / Self Care | Payer: Managed Care, Other (non HMO) | Source: Ambulatory Visit | Attending: Vascular Surgery | Admitting: Vascular Surgery

## 2011-05-27 ENCOUNTER — Ambulatory Visit (HOSPITAL_COMMUNITY)
Admission: RE | Admit: 2011-05-27 | Discharge: 2011-05-27 | Disposition: A | Discharge status: Home / Self Care | Payer: Managed Care, Other (non HMO) | Source: Ambulatory Visit | Attending: Vascular Surgery | Admitting: Vascular Surgery

## 2011-05-27 ENCOUNTER — Ambulatory Visit (INDEPENDENT_AMBULATORY_CARE_PROVIDER_SITE_OTHER): Payer: Managed Care, Other (non HMO) | Admitting: Internal Medicine

## 2011-05-27 DIAGNOSIS — I429 Cardiomyopathy, unspecified: Secondary | ICD-10-CM

## 2011-05-27 DIAGNOSIS — I517 Cardiomegaly: Secondary | ICD-10-CM | POA: Insufficient documentation

## 2011-05-27 DIAGNOSIS — I10 Essential (primary) hypertension: Secondary | ICD-10-CM

## 2011-05-27 DIAGNOSIS — Z95810 Presence of automatic (implantable) cardiac defibrillator: Secondary | ICD-10-CM | POA: Insufficient documentation

## 2011-05-27 DIAGNOSIS — Z01818 Encounter for other preprocedural examination: Secondary | ICD-10-CM | POA: Insufficient documentation

## 2011-05-27 DIAGNOSIS — Z01812 Encounter for preprocedural laboratory examination: Secondary | ICD-10-CM | POA: Insufficient documentation

## 2011-05-27 DIAGNOSIS — R52 Pain, unspecified: Secondary | ICD-10-CM

## 2011-05-27 DIAGNOSIS — I5022 Chronic systolic (congestive) heart failure: Secondary | ICD-10-CM

## 2011-05-27 LAB — ICD DEVICE OBSERVATION
AL AMPLITUDE: 2.4 mv
AL THRESHOLD: 1 V
ATRIAL PACING ICD: 0.15 pct
BATTERY VOLTAGE: 3.1807 V
FVT: 0
LV LEAD THRESHOLD: 1.25 V
RV LEAD IMPEDENCE ICD: 456 Ohm
RV LEAD THRESHOLD: 0.75 V
TZAT-0001ATACH: 1
TZAT-0001ATACH: 3
TZAT-0001SLOWVT: 1
TZAT-0002ATACH: NEGATIVE
TZAT-0004SLOWVT: 8
TZAT-0005SLOWVT: 88 pct
TZAT-0011SLOWVT: 10 ms
TZAT-0012ATACH: 150 ms
TZAT-0012ATACH: 150 ms
TZAT-0012FASTVT: 200 ms
TZAT-0018ATACH: NEGATIVE
TZAT-0018ATACH: NEGATIVE
TZAT-0018FASTVT: NEGATIVE
TZAT-0019ATACH: 6 V
TZAT-0019FASTVT: 8 V
TZAT-0020ATACH: 1.5 ms
TZAT-0020FASTVT: 1.5 ms
TZON-0003ATACH: 350 ms
TZON-0004VSLOWVT: 36
TZST-0001ATACH: 4
TZST-0001ATACH: 5
TZST-0001ATACH: 6
TZST-0001FASTVT: 3
TZST-0001FASTVT: 5
TZST-0001FASTVT: 6
TZST-0001SLOWVT: 2
TZST-0001SLOWVT: 3
TZST-0001SLOWVT: 5
TZST-0001SLOWVT: 6
TZST-0002ATACH: NEGATIVE
TZST-0002ATACH: NEGATIVE
TZST-0002ATACH: NEGATIVE
TZST-0002FASTVT: NEGATIVE
TZST-0002FASTVT: NEGATIVE
TZST-0002FASTVT: NEGATIVE
TZST-0003SLOWVT: 20 J
TZST-0003SLOWVT: 35 J
TZST-0003SLOWVT: 35 J
VF: 0

## 2011-05-27 LAB — URINALYSIS, ROUTINE W REFLEX MICROSCOPIC
Bilirubin Urine: NEGATIVE
Glucose, UA: NEGATIVE mg/dL
Hgb urine dipstick: NEGATIVE
Protein, ur: NEGATIVE mg/dL

## 2011-05-27 LAB — URINE MICROSCOPIC-ADD ON

## 2011-05-27 LAB — CBC
Hemoglobin: 10.1 g/dL — ABNORMAL LOW (ref 12.0–15.0)
MCH: 29 pg (ref 26.0–34.0)
MCHC: 33.6 g/dL (ref 30.0–36.0)
Platelets: 189 10*3/uL (ref 150–400)
RDW: 14.6 % (ref 11.5–15.5)

## 2011-05-27 LAB — COMPREHENSIVE METABOLIC PANEL
ALT: 12 U/L (ref 0–35)
AST: 17 U/L (ref 0–37)
Alkaline Phosphatase: 90 U/L (ref 39–117)
CO2: 24 mEq/L (ref 19–32)
Calcium: 9.2 mg/dL (ref 8.4–10.5)
GFR calc non Af Amer: >60 mL/min (ref >60)
Potassium: 4 mEq/L (ref 3.5–5.1)
Sodium: 143 mEq/L (ref 135–145)
Total Protein: 7.2 g/dL (ref 6.0–8.3)

## 2011-05-27 LAB — PROTIME-INR
INR: 1.14 (ref 0.00–1.49)
Prothrombin Time: 14.8 seconds (ref 11.6–15.2)

## 2011-05-27 LAB — ABO/RH: ABO/RH(D): B POS

## 2011-05-27 LAB — SURGICAL PCR SCREEN: Staphylococcus aureus: NEGATIVE

## 2011-05-27 LAB — TYPE AND SCREEN

## 2011-05-27 NOTE — Assessment & Plan Note (Addendum)
Her device is working normally. Will recheck in several months. 

## 2011-05-27 NOTE — Assessment & Plan Note (Signed)
Her systolic blood pressure is slightly elevated. Her meds are being adjusted by Dr. [name]. She is instructed to reduce her sodium intake.

## 2011-05-27 NOTE — Patient Instructions (Signed)
Your physician recommends that you schedule a follow-up appointment in: 1 yr Dr. Lovena Le 3 month pacer check

## 2011-05-27 NOTE — Assessment & Plan Note (Signed)
Her symptoms are well controlled and class 2. She will continue her current meds and maintain a low sodium diet.

## 2011-05-27 NOTE — Progress Notes (Signed)
HPI Mr. Tritten returns today for followup. She is a pleasant 65 yo woman with a h/o DCM, CHF, s/p BIV ICD implant. She is scheduled for toe amputation and lower extremity revascularization tomorrow. She denies c/p, sob or peripheral edema. No complaints. She appears to have class 2 CHF symptoms. No ICD shocks. Allergies  Allergen Reactions  . Hydrocodone-Acetaminophen      Current Outpatient Prescriptions  Medication Sig Dispense Refill  . aspirin 81 MG tablet Take 81 mg by mouth daily.        . carvedilol (COREG) 12.5 MG tablet Take 1 tablet (12.5 mg total) by mouth 2 (two) times daily.  180 tablet  3  . Cholecalciferol (VITAMIN D3) 1000000 UNIT/GM LIQD by Does not apply route daily.        . digoxin (LANOXIN) 0.25 MG tablet Take 0.5 tablets (0.125 mg total) by mouth daily.  30 tablet  4  . docusate sodium (COLACE) 100 MG capsule Take 100 mg by mouth as needed.        . doxycycline (DORYX) 100 MG DR capsule Take 100 mg by mouth 2 (two) times daily.        . hydrALAZINE (APRESOLINE) 50 MG tablet Take 1 tablet (50 mg total) by mouth 3 (three) times daily.  90 tablet  0  . insulin glargine (LANTUS) 100 UNIT/ML injection Inject 40 Units into the skin daily.        Marland Kitchen losartan (COZAAR) 50 MG tablet Take 1 tablet (50 mg total) by mouth 2 (two) times daily.  60 tablet  0  . nitroGLYCERIN (NITROSTAT) 0.4 MG SL tablet Place 0.4 mg under the tongue every 5 (five) minutes as needed.        Marland Kitchen spironolactone (ALDACTONE) 25 MG tablet Take 1 tablet (25 mg total) by mouth daily. 1/2 tab daily  30 tablet  4     Past Medical History  Diagnosis Date  . Diabetes mellitus, type 2   . Essential hypertension, benign   . Chronic systolic heart failure   . Coronary atherosclerosis of native coronary artery     Nonobstructive  . Nonischemic cardiomyopathy     LVEF 20%  . PAD (peripheral artery disease)     Left foot gangrene  . CHF (congestive heart failure)   . Anxiety   . Depression   . Constipation    . Heart murmur     ROS:   All systems reviewed and negative except as noted in the HPI.   Past Surgical History  Procedure Date  . Abdominal hysterectomy   . Left shoulder surgery   . Breast lumpectomy   . Cardiac defibrillator placement     [employer] [id number]   . Exploratory laparotomy with lysis of adhesions   . Bladder surgery 2011 BENIGN MASS REMOVED FROM BLADDER  . Eye surgery BILATERAL CATARACTS REMOVAL  10 YEARS AGO PER PATIENT     Family History  Problem Relation Age of Onset  . Cirrhosis Father   . Cancer Mother     Ovarian  . Ovarian cancer Mother      History   Social History  . Marital Status: Divorced    Spouse Name: N/A    Number of Children: 2  . Years of Education: N/A   Occupational History  . DISABLED   . [employer] [facility]-full time     (out of work)   Social History Main Topics  . Smoking status: Never Smoker   . Smokeless tobacco: Never  Used   Comment: tobacco use-no  . Alcohol Use: No  . Drug Use: No  . Sexually Active: Not on file   Other Topics Concern  . Not on file   Social History Narrative  . No narrative on file     BP 152/68  Pulse 59  Resp 18  Ht 5\' 3"  (1.6 m)  Wt 148 lb (67.132 kg)  BMI 26.22 kg/m2  SpO2 98%  Physical Exam:  Well appearing NAD HEENT: Unremarkable Neck:  No JVD, no thyromegally Lymphatics:  No adenopathy Back:  No CVA tenderness Lungs:  Clear with a well healed ICD incision. HEART:  Regular rate rhythm, no murmurs, no rubs, no clicks Abd:  soft, positive bowel sounds, no organomegally, no rebound, no guarding Ext:  trace edema,  no clubbing Skin:  No rashes no nodules Neuro:  CN II through XII intact, motor grossly intact  DEVICE  Normal device function.  See PaceArt for details.   Assess/Plan:

## 2011-05-28 ENCOUNTER — Inpatient Hospital Stay (HOSPITAL_COMMUNITY)
Admission: RE | Admit: 2011-05-28 | Discharge: 2011-06-01 | DRG: 253 | Disposition: A | Discharge status: Home / Self Care | Payer: Managed Care, Other (non HMO) | Source: Ambulatory Visit | Attending: Vascular Surgery | Admitting: Vascular Surgery

## 2011-05-28 DIAGNOSIS — E119 Type 2 diabetes mellitus without complications: Secondary | ICD-10-CM | POA: Diagnosis present

## 2011-05-28 DIAGNOSIS — I70269 Atherosclerosis of native arteries of extremities with gangrene, unspecified extremity: Principal | ICD-10-CM | POA: Diagnosis present

## 2011-05-28 DIAGNOSIS — F329 Major depressive disorder, single episode, unspecified: Secondary | ICD-10-CM | POA: Diagnosis present

## 2011-05-28 DIAGNOSIS — Z01818 Encounter for other preprocedural examination: Secondary | ICD-10-CM

## 2011-05-28 DIAGNOSIS — K5900 Constipation, unspecified: Secondary | ICD-10-CM | POA: Diagnosis present

## 2011-05-28 DIAGNOSIS — Z01810 Encounter for preprocedural cardiovascular examination: Secondary | ICD-10-CM

## 2011-05-28 DIAGNOSIS — Z950 Presence of cardiac pacemaker: Secondary | ICD-10-CM

## 2011-05-28 DIAGNOSIS — L98499 Non-pressure chronic ulcer of skin of other sites with unspecified severity: Secondary | ICD-10-CM

## 2011-05-28 DIAGNOSIS — I428 Other cardiomyopathies: Secondary | ICD-10-CM | POA: Diagnosis present

## 2011-05-28 DIAGNOSIS — Z01812 Encounter for preprocedural laboratory examination: Secondary | ICD-10-CM

## 2011-05-28 DIAGNOSIS — L97509 Non-pressure chronic ulcer of other part of unspecified foot with unspecified severity: Secondary | ICD-10-CM | POA: Diagnosis present

## 2011-05-28 DIAGNOSIS — I509 Heart failure, unspecified: Secondary | ICD-10-CM | POA: Diagnosis present

## 2011-05-28 DIAGNOSIS — F411 Generalized anxiety disorder: Secondary | ICD-10-CM | POA: Diagnosis present

## 2011-05-28 DIAGNOSIS — Z794 Long term (current) use of insulin: Secondary | ICD-10-CM

## 2011-05-28 DIAGNOSIS — Z7982 Long term (current) use of aspirin: Secondary | ICD-10-CM

## 2011-05-28 DIAGNOSIS — I10 Essential (primary) hypertension: Secondary | ICD-10-CM | POA: Diagnosis present

## 2011-05-28 DIAGNOSIS — I5022 Chronic systolic (congestive) heart failure: Secondary | ICD-10-CM | POA: Diagnosis present

## 2011-05-28 DIAGNOSIS — F3289 Other specified depressive episodes: Secondary | ICD-10-CM | POA: Diagnosis present

## 2011-05-28 DIAGNOSIS — D62 Acute posthemorrhagic anemia: Secondary | ICD-10-CM | POA: Diagnosis not present

## 2011-05-28 DIAGNOSIS — I739 Peripheral vascular disease, unspecified: Secondary | ICD-10-CM

## 2011-05-28 DIAGNOSIS — I2510 Atherosclerotic heart disease of native coronary artery without angina pectoris: Secondary | ICD-10-CM | POA: Diagnosis present

## 2011-05-28 LAB — GLUCOSE, CAPILLARY: Glucose-Capillary: 149 mg/dL — ABNORMAL HIGH (ref 70–99)

## 2011-05-29 DIAGNOSIS — I739 Peripheral vascular disease, unspecified: Secondary | ICD-10-CM

## 2011-05-29 DIAGNOSIS — M79609 Pain in unspecified limb: Secondary | ICD-10-CM

## 2011-05-29 LAB — BASIC METABOLIC PANEL
BUN: 18 mg/dL (ref 6–23)
CO2: 22 mEq/L (ref 19–32)
Chloride: 110 mEq/L (ref 96–112)
Creatinine, Ser: 0.65 mg/dL (ref 0.50–1.10)
Glucose, Bld: 165 mg/dL — ABNORMAL HIGH (ref 70–99)

## 2011-05-29 LAB — CBC
HCT: 26.8 % — ABNORMAL LOW (ref 36.0–46.0)
Hemoglobin: 8.7 g/dL — ABNORMAL LOW (ref 12.0–15.0)
MCH: 28.6 pg (ref 26.0–34.0)
MCV: 88.2 fL (ref 78.0–100.0)
Platelets: 169 10*3/uL (ref 150–400)
RBC: 3.04 MIL/uL — ABNORMAL LOW (ref 3.87–5.11)

## 2011-05-29 LAB — GLUCOSE, CAPILLARY: Glucose-Capillary: 120 mg/dL — ABNORMAL HIGH (ref 70–99)

## 2011-05-30 LAB — CBC
MCHC: 32.3 g/dL (ref 30.0–36.0)
Platelets: 169 10*3/uL (ref 150–400)
RDW: 15 % (ref 11.5–15.5)
WBC: 9.7 10*3/uL (ref 4.0–10.5)

## 2011-05-30 LAB — GLUCOSE, CAPILLARY: Glucose-Capillary: 151 mg/dL — ABNORMAL HIGH (ref 70–99)

## 2011-05-31 LAB — URINALYSIS, ROUTINE W REFLEX MICROSCOPIC
Glucose, UA: NEGATIVE mg/dL
Ketones, ur: NEGATIVE mg/dL
Nitrite: NEGATIVE
Protein, ur: NEGATIVE mg/dL
pH: 5 (ref 5.0–8.0)

## 2011-05-31 LAB — URINE MICROSCOPIC-ADD ON

## 2011-05-31 LAB — CBC
Hemoglobin: 8.4 g/dL — ABNORMAL LOW (ref 12.0–15.0)
MCH: 31 pg (ref 26.0–34.0)
MCHC: 36.1 g/dL — ABNORMAL HIGH (ref 30.0–36.0)
Platelets: 176 10*3/uL (ref 150–400)
RBC: 2.71 MIL/uL — ABNORMAL LOW (ref 3.87–5.11)

## 2011-05-31 LAB — GLUCOSE, CAPILLARY
Glucose-Capillary: 155 mg/dL — ABNORMAL HIGH (ref 70–99)
Glucose-Capillary: 183 mg/dL — ABNORMAL HIGH (ref 70–99)
Glucose-Capillary: 176 mg/dL — ABNORMAL HIGH (ref 70–99)

## 2011-06-01 LAB — GLUCOSE, CAPILLARY
Glucose-Capillary: 119 mg/dL — ABNORMAL HIGH (ref 70–99)
Glucose-Capillary: 147 mg/dL — ABNORMAL HIGH (ref 70–99)

## 2011-06-01 LAB — CBC
HCT: 31.7 % — ABNORMAL LOW (ref 36.0–46.0)
Hemoglobin: 10.6 g/dL — ABNORMAL LOW (ref 12.0–15.0)
MCH: 28.6 pg (ref 26.0–34.0)
MCHC: 33.4 g/dL (ref 30.0–36.0)
MCV: 85.4 fL (ref 78.0–100.0)
RDW: 15 % (ref 11.5–15.5)

## 2011-06-02 LAB — CROSSMATCH: Unit division: 0

## 2011-06-23 NOTE — Discharge Summary (Signed)
[name], [name] NO.:  [id number]  MEDICAL RECORD NO.:  [id number]  LOCATION:  2020                         FACILITY:  [facility]  PHYSICIAN:  Rosetta Posner, M.D.    DATE OF BIRTH:  08/12/1946  DATE OF ADMISSION:  05/28/2011 DATE OF DISCHARGE:  06/01/2011                              DISCHARGE SUMMARY   ADMISSION DIAGNOSIS:  Peripheral arterial disease.  HISTORY OF PRESENT ILLNESS:  This is a 65 year old female who presented for evaluation of gangrene in her left foot.  It began as a skin tear over the dorsum of her left great toe.  This has now progressed to tissue loss involving her first metatarsal head.  She denies any pain associated with this.  She was seen by Dr. Angelena Form with an ankle arm index of 0.84 on the left and 0.61 on the right.  An arteriogram was performed on May 06, 2011.  She had a near normal flow throughout her iliac system.  Her left SFA is widely patent with some mild narrowing of her abductor canal.  Her popliteal artery is widely patent.  She had complete occlusion of all three trifurcations as well as on the left and has distal reconstitution of her dorsalis pedal and posterior tibial arteries.  Posterior tibial is a largest artery.  She does have a history of congestive heart failure and pacemaker implantation.  She also has a long history of insulin-dependent diabetes.  HOSPITAL COURSE:  She was admitted to the hospital and taken to the operating room on May 28, 2011, where she underwent a left below-the- knee popliteal to distal posterior tibial bypass with translocated nonreversed greater saphenous vein.  She tolerated the procedure well and was transported to the recovery room in satisfactory condition.  Dr. [name] was consulted for her left great toe gangrene.  He started her on Santyl dressings daily to her left foot ulcer.  He will continue her doxycycline for 4 weeks.  He is hopeful this will heal without surgery. By  postoperative day #1, the patient was doing well with moderate discomfort.  She had well perfused left lower extremity with positive Doppler signal in the AT and PT.  She had acute surgical blood loss anemia which was expected and iron sulfate was begun.  Otherwise, her hospital course has included increasing ambulation as well as increasing intake of solids without difficulty.  She did have a blood transfusion for her acute surgical blood loss anemia.  DISCHARGE INSTRUCTIONS:  She is discharged home with extensive instructions on wound care and progressive ambulation.  She is instructed not to drive or perform any heavy lifting for 1 month.  DISCHARGE DIAGNOSES: 1. Critical limb ischemia of left foot with severe tibial vessel     occlusive disease.     a.     Status post left below-the-knee popliteal to posterior      tibial bypass, May 28, 2011. 2. Diabetes. 3. Hypertension. 4. Chronic systolic heart failure. 5. Nonobstructive coronary artery disease. 6. Nonischemic cardiomyopathy with an ejection fraction of 20%. 7. Congestive heart failure. 8. Anxiety. 9. Depression.  DISCHARGE MEDICATIONS: 1. Santyl one application topically daily. (new) 2. Ferrous sulfate 325 mg p.o. b.i.d.  x1 month. (new) 3. Oxycodone 5 mg 1-2 tablets p.o. q.4 h. p.r.n. pain #30 no refill. (new) 4. Aspirin 81 mg p.o. daily. 5. Coreg 12.5 mg p.o. b.i.d. 6. Digoxin 0.125 mg p.o. daily. 7. Colace 100 mg p.o. daily p.r.n. 8. Doxycycline 100 mg p.o. b.i.d. x4 weeks per Dr. [name]. (Rx given) 9. Hydralazine 50 mg p.o. t.i.d. 10.Lantus 40 units subcu q.a.m. 11.Losartan 50 mg p.o. b.i.d. 12.Nitroglycerin sublingual 0.4 mg q.5 minutes p.r.n. 13.Spironolactone 25 mg 1/2 tablet daily. 14.Vitamin D 1000 units over the counter 1 tablet p.o. every other     day.  FOLLOWUP: 1. The patient is to follow up with Dr. [name] in 1 week. 2. The patient is to follow up with Dr. Donnetta Hutching in 3-4 weeks.     Evorn Gong, PA   ______________________________ Rosetta Posner, M.D.    SE/MEDQ  D:  06/01/2011  T:  06/01/2011  Job:  [id number]  Electronically Signed by Evorn Gong PA on 06/01/2011 10:49:33 AM Electronically Signed by Lavonna Lampron M.D. on 06/23/2011 01:10:47 PM

## 2011-06-23 NOTE — Op Note (Signed)
[name], [name] NO.:  [id number]  MEDICAL RECORD NO.:  [id number]  LOCATION:  18                         FACILITY:  [facility]  PHYSICIAN:  Rosetta Posner, M.D.    DATE OF BIRTH:  May 31, 1946  DATE OF PROCEDURE:  05/28/2011 DATE OF DISCHARGE:                              OPERATIVE REPORT   PREOPERATIVE DIAGNOSIS:  Nonhealing left great toe ulcer with severe ischemia.  POSTOPERATIVE DIAGNOSIS:  Nonhealing left great toe ulcer with severe ischemia.  PROCEDURE:  Left below-knee popliteal to distal posterior tibial bypass with translocated non-reversed, great saphenous vein.  SURGEON:  Rosetta Posner, MD  ASSISTANT:  Nurse.  ANESTHESIA:  General endotracheal.  COMPLICATIONS:  None.  DISPOSITION:  To recovery room stable.  PROCEDURE IN DETAIL:  The patient was taken to operating room and placed in supine position, where the area of the left groin and left leg were prepped and draped in usual sterile fashion.  The ultrasound was used to image the saphenous vein.  This was multiply branched and adequate quality from the below-knee position, and the above-knee position in the mid-to-distal thigh, the vein was of better caliber up to the saphenofemoral junction.  Incision was made in the medial aspect of the below knee to expose the below-knee popliteal artery.  The artery had an excellent pulse and had minimal atherosclerotic change.  Separate incision was made over the posterior tibial just above the level of the ankle.  The fascia was opened and the posterior tibial artery was exposed along the side of the posterior tibial vein.  The artery was small caliber.  Next, separate incisions were made beginning in the mid- to-distal thigh and extending up to the saphenofemoral junction.  The vein was small, but adequate for bypass.  Tributary branches were ligated with 3-0 and 4-0 silk ties and divided.  The vein was ligated at the saphenofemoral junction with  a 2-0 silk tie.  The vein became small and unusable at the level of the knee.  The entirety of the vein was harvested and gently dilated.  A tunnel was created from the level of the below-knee popliteal artery to the posterior tibial artery.  The medial fascia was opened to prevent any pinching of the vein.  The patient was given 7000 units of intravenous heparin.  After adequate circulation time, the below-knee popliteal artery was occluded proximally distally and was opened with an 11-blade and extended longitudinally with Potts scissors.  The vein was not reversed and it was spatulated and sewn end-to-side to the artery with a running 6-0 Prolene suture.  Anastomosis test was found to be adequate.  Next, the vein valves were lysed with a [place] valvulotome giving excellent flow through the vein.  The vein was brought through the prior created tunnel down to the level of the posterior tibial artery.  The posterior tibial artery was occluded proximally distally with serrefine clamps and was opened with an 11-blade and extended longitudinally with Potts scissors. The vein was cut to appropriate length and sewn in an end-to-side to the artery with a running 7-0 Prolene suture.  Prior to completion of the anastomosis usual flushing maneuvers were undertaken.  A one  and a half dilator passed through the distal anastomosis and through the vein without difficulty.  Anastomosis was completed.  Graft dependent flow was noted in the posterior tibial artery.  The patient was given 50 mg of protamine to reverse the heparin.  Wounds were irrigated and hemostasis obtained with electrocautery.  Wounds were closed with a 2-0 Vicryl to close the fascia and the medial popliteal incision.  Other incisions were closed with 3-0 and 4-0 subcutaneous and subcuticular suture of Vicryl.  Sterile dressing was applied.  The patient was taken to the recovery room in stable condition.     Rosetta Posner,  M.D.     TFE/MEDQ  D:  05/28/2011  T:  05/28/2011  Job:  [id number]  Electronically Signed by Brittan Butterbaugh M.D. on 06/23/2011 01:10:49 PM

## 2011-06-23 NOTE — Op Note (Signed)
  NAMEPHILECIA, [name] NO.:  [id number]  MEDICAL RECORD NO.:  [id number]  LOCATION:  55                         FACILITY:  [facility]  PHYSICIAN:  Rosetta Posner, M.D.    DATE OF BIRTH:  04-14-1946  DATE OF PROCEDURE:  05/28/2011 DATE OF DISCHARGE:                              [place] had a left below-knee popliteal to posterior tibial bypass with translocated non-reverse saphenous vein.  SURGEON:  Rosetta Posner, MD  ASSISTANT:  Nurse.     Rosetta Posner, M.D.     TFE/MEDQ  D:  05/28/2011  T:  05/28/2011  Job:  [id number]  Electronically Signed by Gaylin Osoria M.D. on 06/23/2011 01:10:51 PM

## 2011-06-29 ENCOUNTER — Encounter: Payer: Self-pay | Admitting: Vascular Surgery

## 2011-06-30 ENCOUNTER — Ambulatory Visit: Payer: Managed Care, Other (non HMO) | Admitting: Vascular Surgery

## 2011-06-30 ENCOUNTER — Encounter: Payer: Self-pay | Admitting: Cardiology

## 2011-06-30 ENCOUNTER — Ambulatory Visit (INDEPENDENT_AMBULATORY_CARE_PROVIDER_SITE_OTHER): Payer: Managed Care, Other (non HMO) | Admitting: Vascular Surgery

## 2011-06-30 ENCOUNTER — Encounter: Payer: Self-pay | Admitting: Vascular Surgery

## 2011-06-30 VITALS — BP 202/77 | HR 62 | Resp 20 | Ht 61.0 in | Wt 164.0 lb

## 2011-06-30 DIAGNOSIS — I7092 Chronic total occlusion of artery of the extremities: Secondary | ICD-10-CM

## 2011-06-30 NOTE — Progress Notes (Signed)
Addended by: Mena Goes on: 06/30/2011 10:36 AM   Modules accepted: Orders

## 2011-06-30 NOTE — Progress Notes (Signed)
The patient presents today for followup of her left below-knee popliteal to posterior tibial bypass with great saphenous vein. She had a nonhealing ulceration over her first metatarsal head and the dorsum of her foot. This continues to heal nicely. She does have the usual amount of postoperative swelling but also has pitting edema in the contralateral leg. Her surgical incisions are healing well as well. She has excellent biphasic Doppler signal at her posterior tibial artery at the ankle. She will continue followup with Dr. [name] regarding her foot. And will see [place] again in 3 months for continued bypass followup.

## 2011-07-02 ENCOUNTER — Ambulatory Visit (INDEPENDENT_AMBULATORY_CARE_PROVIDER_SITE_OTHER): Payer: Managed Care, Other (non HMO) | Admitting: Cardiology

## 2011-07-02 ENCOUNTER — Encounter: Payer: Self-pay | Admitting: Cardiology

## 2011-07-02 VITALS — BP 201/70 | HR 72 | Resp 19 | Ht 63.0 in | Wt 170.4 lb

## 2011-07-02 DIAGNOSIS — I10 Essential (primary) hypertension: Secondary | ICD-10-CM

## 2011-07-02 DIAGNOSIS — I739 Peripheral vascular disease, unspecified: Secondary | ICD-10-CM

## 2011-07-02 DIAGNOSIS — I429 Cardiomyopathy, unspecified: Secondary | ICD-10-CM

## 2011-07-02 DIAGNOSIS — I5022 Chronic systolic (congestive) heart failure: Secondary | ICD-10-CM

## 2011-07-02 MED ORDER — HYDRALAZINE HCL 100 MG PO TABS: 100.0000 mg | ORAL_TABLET | Freq: Three times a day (TID) | ORAL | Status: DC

## 2011-07-02 MED ORDER — FUROSEMIDE 20 MG PO TABS: 20.0000 mg | ORAL_TABLET | Freq: Every day | ORAL | Status: DC

## 2011-07-02 NOTE — Assessment & Plan Note (Signed)
Status post recent left below the knee popliteal to tibial bypass with Dr. Donnetta Hutching.

## 2011-07-02 NOTE — Progress Notes (Signed)
**Note De-Identified [place] Obfuscation** Addended by: Dennie Fetters on: 07/02/2011 05:16 PM   Modules accepted: Orders

## 2011-07-02 NOTE — Progress Notes (Signed)
Clinical Summary Ms. Larosa is a 65 y.o.female presenting for followup. She was seen in August. Since last visit she did undergo left below-the-knee popliteal to tibial bypass with Dr. Donnetta Hutching and reports doing well.   She states that she generally feels "good" has not had any chest pain or progressive shortness of breath. Her weight is up almost 10 pounds, and she states that she thinks some of this is related to improved appetite.  Blood pressure trend continues to worsen. She states that she has been compliant with her medications, including the increased doses made over her last few visits.   Allergies  Allergen Reactions  . Hydrocodone-Acetaminophen     Medication list reviewed.  Past Medical History  Diagnosis Date  . Diabetes mellitus, type 2   . Essential hypertension, benign   . Chronic systolic heart failure   . Coronary atherosclerosis of native coronary artery     Nonobstructive  . Nonischemic cardiomyopathy     LVEF 20%  . PAD (peripheral artery disease)     Left foot gangrene  . CHF (congestive heart failure)   . Anxiety   . Depression   . Constipation   . Heart murmur     Past Surgical History  Procedure Date  . Abdominal hysterectomy   . Left shoulder surgery   . Breast lumpectomy   . Cardiac defibrillator placement     Medtronic [id number]   . Exploratory laparotomy with lysis of adhesions   . Bladder surgery 2011 BENIGN MASS REMOVED FROM BLADDER  . Eye surgery BILATERAL CATARACTS REMOVAL  10 YEARS AGO PER PATIENT    Family History  Problem Relation Age of Onset  . Cirrhosis Father   . Cancer Mother     Ovarian  . Ovarian cancer Mother     Social History Ms. Albelo reports that she has never smoked. She has never used smokeless tobacco. Ms. Windell reports that she does not drink alcohol.  Review of Systems No palpitations or syncope.  Physical Examination Filed Vitals:   07/02/11 1536  BP: 201/70  Pulse: 72  Resp: 19   Overweight  woman in no acute distress.  HEENT: Conjunctiva and lids normal, oropharynx clear.  Neck: Supple, JVP 8 cm water, no carotid bruits, no thyromegaly.  Lungs: Clear with diminished breath sounds, nonlabored.  Cardiac: Indistinct PMI, regular rate and rhythm, paradoxically split S2, no S3.  Thorax: Well-healed device pocket site on the upper left.  Abdomen: Soft, nontender, no hepatomegaly, bowel sounds present.  Extremities: Trace ankle edema, left foot dressed. Skin: Warm and dry.  Neuropsychiatric: Alert and oriented x3, affect appropriate.    Problem List and Plan

## 2011-07-02 NOTE — Assessment & Plan Note (Signed)
Plan to increase hydralazine to 100 mg p.o. T.i.d., also initiate Lasix 20 mg daily. Continue current medications otherwise.

## 2011-07-02 NOTE — Assessment & Plan Note (Signed)
Nonischemic cardiomyopathy as defined previously.

## 2011-07-02 NOTE — Assessment & Plan Note (Signed)
She seems to be reasonably stable, but medication adjustments are being made to better address blood pressure. Also plan for a followup echocardiogram to see if she has any significant improvement an LVEF following biventricular ICD.

## 2011-07-02 NOTE — Patient Instructions (Addendum)
Your physician has requested that you have an echocardiogram. Echocardiography is a painless test that uses sound waves to create images of your heart. It provides your doctor with information about the size and shape of your heart and how well your heart's chambers and valves are working. This procedure takes approximately one hour. There are no restrictions for this procedure.  Your physician has recommended you make the following change in your medication: increase Hydralazine to 100 mg three times daily and start taking Lasix 20 mg daily  Your physician recommends that you schedule a follow-up appointment in: 3 months

## 2011-07-03 ENCOUNTER — Ambulatory Visit (HOSPITAL_COMMUNITY)
Admission: RE | Admit: 2011-07-03 | Discharge: 2011-07-03 | Disposition: A | Discharge status: Home / Self Care | Payer: Managed Care, Other (non HMO) | Source: Ambulatory Visit | Attending: Cardiology | Admitting: Cardiology

## 2011-07-03 DIAGNOSIS — I428 Other cardiomyopathies: Secondary | ICD-10-CM | POA: Insufficient documentation

## 2011-07-03 DIAGNOSIS — I447 Left bundle-branch block, unspecified: Secondary | ICD-10-CM | POA: Insufficient documentation

## 2011-07-03 DIAGNOSIS — E785 Hyperlipidemia, unspecified: Secondary | ICD-10-CM | POA: Insufficient documentation

## 2011-07-03 DIAGNOSIS — I10 Essential (primary) hypertension: Secondary | ICD-10-CM | POA: Insufficient documentation

## 2011-07-03 DIAGNOSIS — I319 Disease of pericardium, unspecified: Secondary | ICD-10-CM

## 2011-07-03 DIAGNOSIS — I429 Cardiomyopathy, unspecified: Secondary | ICD-10-CM

## 2011-07-16 ENCOUNTER — Ambulatory Visit: Payer: Managed Care, Other (non HMO) | Admitting: Cardiology

## 2011-08-17 ENCOUNTER — Other Ambulatory Visit: Payer: Self-pay | Admitting: *Deleted

## 2011-08-17 ENCOUNTER — Ambulatory Visit (INDEPENDENT_AMBULATORY_CARE_PROVIDER_SITE_OTHER): Payer: Self-pay | Admitting: *Deleted

## 2011-08-17 ENCOUNTER — Encounter: Payer: Self-pay | Admitting: Internal Medicine

## 2011-08-17 DIAGNOSIS — I429 Cardiomyopathy, unspecified: Secondary | ICD-10-CM

## 2011-08-17 DIAGNOSIS — I5022 Chronic systolic (congestive) heart failure: Secondary | ICD-10-CM

## 2011-08-17 LAB — ICD DEVICE OBSERVATION
AL IMPEDENCE ICD: 342 Ohm
AL THRESHOLD: 0.875 V
ATRIAL PACING ICD: 0.13 pct
BAMS-0001: 170 {beats}/min
BATTERY VOLTAGE: 3.167 V
PACEART VT: 0
RV LEAD THRESHOLD: 0.75 V
TOT-0002: 0
TZAT-0001ATACH: 3
TZAT-0002ATACH: NEGATIVE
TZAT-0004SLOWVT: 8
TZAT-0005SLOWVT: 88 pct
TZAT-0012ATACH: 150 ms
TZAT-0012ATACH: 150 ms
TZAT-0012SLOWVT: 200 ms
TZAT-0013SLOWVT: 2
TZAT-0018ATACH: NEGATIVE
TZAT-0018FASTVT: NEGATIVE
TZAT-0019ATACH: 6 V
TZAT-0019FASTVT: 8 V
TZAT-0020ATACH: 1.5 ms
TZAT-0020ATACH: 1.5 ms
TZON-0003SLOWVT: 320 ms
TZON-0003VSLOWVT: 400 ms
TZON-0004SLOWVT: 32
TZST-0001ATACH: 6
TZST-0001FASTVT: 2
TZST-0001FASTVT: 6
TZST-0001SLOWVT: 2
TZST-0001SLOWVT: 5
TZST-0002ATACH: NEGATIVE
TZST-0002FASTVT: NEGATIVE
TZST-0002FASTVT: NEGATIVE
TZST-0003SLOWVT: 35 J
TZST-0003SLOWVT: 35 J
VENTRICULAR PACING ICD: 99.79 pct
VF: 0

## 2011-08-17 MED ORDER — FUROSEMIDE 20 MG PO TABS: 20.0000 mg | ORAL_TABLET | Freq: Every day | ORAL | Status: DC

## 2011-08-17 NOTE — Progress Notes (Signed)
ICD check with ICM 

## 2011-10-12 ENCOUNTER — Encounter: Payer: Self-pay | Admitting: Cardiology

## 2011-10-12 ENCOUNTER — Ambulatory Visit (INDEPENDENT_AMBULATORY_CARE_PROVIDER_SITE_OTHER): Payer: Medicare Other | Admitting: Cardiology

## 2011-10-12 VITALS — BP 183/70 | HR 65 | Resp 16 | Ht 61.0 in | Wt 165.0 lb

## 2011-10-12 DIAGNOSIS — I10 Essential (primary) hypertension: Secondary | ICD-10-CM

## 2011-10-12 DIAGNOSIS — I5022 Chronic systolic (congestive) heart failure: Secondary | ICD-10-CM

## 2011-10-12 DIAGNOSIS — Z95810 Presence of automatic (implantable) cardiac defibrillator: Secondary | ICD-10-CM

## 2011-10-12 MED ORDER — FUROSEMIDE 20 MG PO TABS: 20.0000 mg | ORAL_TABLET | Freq: Every day | ORAL | Status: DC | PRN

## 2011-10-12 NOTE — Patient Instructions (Signed)
Your physician recommends that you continue on your current medications as directed. Please refer to the Current Medication list given to you today.  Your physician recommends that you schedule a follow-up appointment in: 3 months  

## 2011-10-12 NOTE — Assessment & Plan Note (Signed)
Continue followup with Dr. Lovena Le.

## 2011-10-12 NOTE — Assessment & Plan Note (Signed)
Relatively stable symptoms, NYHA class II. Continue present regimen. We discussed daily weights and PRN adjustment in Lasix dose.

## 2011-10-12 NOTE — Progress Notes (Signed)
Clinical Summary [name] is a 66 y.o.female presenting for followup. She was seen back in September.  Followup echocardiogram in September of last year showed LVEF 20-25% with diffuse hypokinesis, mild mitral regurgitation, small pericardial effusion. We plan to continue medical therapy.  Lab work from November of last year showed hemoglobin 10.2, platelets 200, potassium 4.4, BUN 20, creatinine 0.9, cholesterol 162, triglycerides 41, HDL 67, LDL 87, hemoglobin A1c 6.  Blood pressure trend has been better. Also has had some weight loss after resumption of Lasix.  She states that her foot wound has been steadily improving.   Allergies  Allergen Reactions  . Hydrocodone-Acetaminophen     Current Outpatient Prescriptions  Medication Sig Dispense Refill  . aspirin 81 MG tablet Take 81 mg by mouth daily.        . carvedilol (COREG) 12.5 MG tablet Take 1 tablet (12.5 mg total) by mouth 2 (two) times daily.  180 tablet  3  . Cholecalciferol (VITAMIN D3) 1000000 UNIT/GM LIQD 1,000 Units by Does not apply route every other day.       . digoxin (LANOXIN) 0.25 MG tablet Take 0.5 tablets (0.125 mg total) by mouth daily.  30 tablet  4  . docusate sodium (COLACE) 100 MG capsule Take 100 mg by mouth as needed.        . doxycycline (DORYX) 100 MG DR capsule Take 100 mg by mouth 2 (two) times daily.        . furosemide (LASIX) 20 MG tablet Take 1 tablet (20 mg total) by mouth daily as needed.  45 tablet  3  . hydrALAZINE (APRESOLINE) 100 MG tablet Take 1 tablet (100 mg total) by mouth 3 (three) times daily. Dose increase  270 tablet  1  . insulin glargine (LANTUS) 100 UNIT/ML injection Inject 40 Units into the skin daily.        Marland Kitchen losartan (COZAAR) 50 MG tablet Take 1 tablet (50 mg total) by mouth 2 (two) times daily.  60 tablet  0  . nitroGLYCERIN (NITROSTAT) 0.4 MG SL tablet Place 0.4 mg under the tongue every 5 (five) minutes as needed.        Marland Kitchen spironolactone (ALDACTONE) 25 MG tablet Take  1 tablet (25 mg total) by mouth daily. 1/2 tab daily  30 tablet  4  . DISCONTD: furosemide (LASIX) 20 MG tablet Take 1 tablet (20 mg total) by mouth daily.  90 tablet  1  . DISCONTD: furosemide (LASIX) 20 MG tablet Take 20 mg by mouth daily.         Past Medical History  Diagnosis Date  . Diabetes mellitus, type 2   . Essential hypertension, benign   . Chronic systolic heart failure   . Coronary atherosclerosis of native coronary artery     Nonobstructive  . Nonischemic cardiomyopathy     LVEF 20%  . PAD (peripheral artery disease)     Left foot gangrene  . CHF (congestive heart failure)   . Anxiety   . Depression   . Constipation   . Heart murmur     Social History [name] reports that she has never smoked. She has never used smokeless tobacco. [name] reports that she does not drink alcohol.  Review of Systems No palpitations or ICD shocks. No orthopnea or PND. Otherwise negative.  Physical Examination Filed Vitals:   10/12/11 0824  BP: 183/70  Pulse: 65  Resp: 16    Overweight woman in no acute distress.  HEENT:  Conjunctiva and lids normal, oropharynx clear.  Neck: Supple, JVP 8 cm water, no carotid bruits, no thyromegaly.  Lungs: Clear with diminished breath sounds, nonlabored.  Cardiac: Indistinct PMI, regular rate and rhythm, paradoxically split S2, no S3.  Thorax: Well-healed device pocket site on the upper left.  Abdomen: Soft, nontender, no hepatomegaly, bowel sounds present.  Extremities: Trace ankle edema, left foot dressed.  Skin: Warm and dry.  Neuropsychiatric: Alert and oriented x3, affect appropriate.     Problem List and Plan

## 2011-10-12 NOTE — Assessment & Plan Note (Signed)
Recheck blood pressure by me today was 158/62. We reviewed sodium restriction and medications. Trend is getting better. May have to add another agent eventually however.

## 2011-11-02 ENCOUNTER — Encounter: Payer: Self-pay | Admitting: Vascular Surgery

## 2011-11-03 ENCOUNTER — Encounter (INDEPENDENT_AMBULATORY_CARE_PROVIDER_SITE_OTHER): Payer: Medicare Other | Admitting: *Deleted

## 2011-11-03 ENCOUNTER — Ambulatory Visit (INDEPENDENT_AMBULATORY_CARE_PROVIDER_SITE_OTHER): Payer: Medicare Other | Admitting: Vascular Surgery

## 2011-11-03 ENCOUNTER — Encounter: Payer: Self-pay | Admitting: Vascular Surgery

## 2011-11-03 VITALS — BP 226/77 | HR 70 | Resp 18 | Ht 61.0 in | Wt 169.0 lb

## 2011-11-03 DIAGNOSIS — I739 Peripheral vascular disease, unspecified: Secondary | ICD-10-CM

## 2011-11-03 DIAGNOSIS — Z48812 Encounter for surgical aftercare following surgery on the circulatory system: Secondary | ICD-10-CM

## 2011-11-03 DIAGNOSIS — I70509 Unspecified atherosclerosis of nonautologous biological bypass graft(s) of the extremities, unspecified extremity: Secondary | ICD-10-CM

## 2011-11-03 DIAGNOSIS — I7092 Chronic total occlusion of artery of the extremities: Secondary | ICD-10-CM

## 2011-11-03 NOTE — Progress Notes (Signed)
[facility]   Patient name: [name] MRN: [id number] DOB: [birth year] Sex: female   Referred by: Mcginnis  Reason for referral:  Chief Complaint  Patient presents with  . PAD    F/U  PAD  LAB STUDY TODAY    HISTORY OF PRESENT ILLNESS: The patient presents today for followup of her left popliteal to posterior tibial bypass. Surgery was in August of 2012. She had extensive tissue loss over the medial aspect of her left first metatarsal head. She has done extremely well following this and is nearly healed the lesion. He has no symptoms of claudication or lower trim the rest pain  Past Medical History  Diagnosis Date  . Diabetes mellitus, type 2   . Essential hypertension, benign   . Chronic systolic heart failure   . Coronary atherosclerosis of native coronary artery     Nonobstructive  . Nonischemic cardiomyopathy     LVEF 20%  . PAD (peripheral artery disease)     Left foot gangrene  . CHF (congestive heart failure)   . Anxiety   . Depression   . Constipation   . Heart murmur     Past Surgical History  Procedure Date  . Abdominal hysterectomy   . Left shoulder surgery   . Breast lumpectomy   . Cardiac defibrillator placement     Medtronic [id number]   . Exploratory laparotomy with lysis of adhesions   . Bladder surgery 2011 BENIGN MASS REMOVED FROM BLADDER  . Eye surgery BILATERAL CATARACTS REMOVAL  10 YEARS AGO PER PATIENT    History   Social History  . Marital Status: Divorced    Spouse Name: N/A    Number of Children: 2  . Years of Education: N/A   Occupational History  . DISABLED   . [employer] [facility] [employer]-full time     (out of work)   Social History Main Topics  . Smoking status: Never Smoker   . Smokeless tobacco: Never Used   Comment: tobacco use-no  . Alcohol Use: No  . Drug Use: No  . Sexually Active: Not on file   Other Topics Concern  . Not on file   Social History Narrative  . No narrative on file     Family History  Problem Relation Age of Onset  . Cirrhosis Father   . Cancer Mother     Ovarian  . Ovarian cancer Mother     Allergies as of 11/03/2011 - Review Complete 11/03/2011  Allergen Reaction Noted  . Hydrocodone-acetaminophen  12/31/2009    Current Outpatient Prescriptions on File Prior to Visit  Medication Sig Dispense Refill  . aspirin 81 MG tablet Take 81 mg by mouth daily.        . carvedilol (COREG) 12.5 MG tablet Take 1 tablet (12.5 mg total) by mouth 2 (two) times daily.  180 tablet  3  . Cholecalciferol (VITAMIN D3) 1000000 UNIT/GM LIQD 1,000 Units by Does not apply route every other day.       . digoxin (LANOXIN) 0.25 MG tablet Take 0.5 tablets (0.125 mg total) by mouth daily.  30 tablet  4  . docusate sodium (COLACE) 100 MG capsule Take 100 mg by mouth as needed.        . doxycycline (DORYX) 100 MG DR capsule Take 100 mg by mouth 2 (two) times daily.        . furosemide (LASIX) 20 MG tablet Take 1 tablet (20 mg total) by mouth daily  as needed.  45 tablet  3  . hydrALAZINE (APRESOLINE) 100 MG tablet Take 1 tablet (100 mg total) by mouth 3 (three) times daily. Dose increase  270 tablet  1  . insulin glargine (LANTUS) 100 UNIT/ML injection Inject 40 Units into the skin daily.        Marland Kitchen losartan (COZAAR) 50 MG tablet Take 1 tablet (50 mg total) by mouth 2 (two) times daily.  60 tablet  0  . nitroGLYCERIN (NITROSTAT) 0.4 MG SL tablet Place 0.4 mg under the tongue every 5 (five) minutes as needed.        Marland Kitchen spironolactone (ALDACTONE) 25 MG tablet Take 1 tablet (25 mg total) by mouth daily. 1/2 tab daily  30 tablet  4  . DISCONTD: furosemide (LASIX) 20 MG tablet Take 1 tablet (20 mg total) by mouth daily.  90 tablet  1     REVIEW OF SYSTEMS:  Positives indicated with an "X"  CARDIOVASCULAR:  [ ]  chest pain   [ ]  chest pressure   [ ]  palpitations   [ ]  orthopnea   [ ]  dyspnea on exertion   [ ]  claudication   [ ]  rest pain   [ ]  DVT   [ ]  phlebitis PULMONARY:   [ ]   productive cough   [ ]  asthma   [ ]  wheezing NEUROLOGIC:   [ ]  weakness  [ ]  paresthesias  [ ]  aphasia  [ ]  amaurosis  [ ]  dizziness HEMATOLOGIC:   [ ]  bleeding problems   [ ]  clotting disorders MUSCULOSKELETAL:  [ ]  joint pain   [ ]  joint swelling GASTROINTESTINAL: [ ]   blood in stool  [ ]   hematemesis GENITOURINARY:  [ ]   dysuria  [ ]   hematuria PSYCHIATRIC:  [ ]  history of major depression INTEGUMENTARY:  [ ]  rashes  [ ]  ulcers CONSTITUTIONAL:  [ ]  fever   [ ]  chills  PHYSICAL EXAMINATION:  General: The patient is a well-nourished female, in no acute distress. Vital signs are BP 226/77  Pulse 70  Resp 18  Ht 5\' 1"  (1.549 m)  Wt 169 lb (76.658 kg)  BMI 31.93 kg/m2 Pulmonary: There is a good air exchange   Abdomen: Soft and non-tender  Musculoskeletal: There are no major deformities.  There is no significant extremity pain. She has a very small superficial eschar of the medial aspect of her left metatarsal head Neurologic: No focal weakness or paresthesias are detected, Skin: There are no ulcer or rashes noted. Psychiatric: The patient has normal affect.     [facility] Vascular Lab Studies:  Ordered and Independently Reviewed stable ankle index at 0.61 the right and 0.7 on the left. She may have elevation due to calcified tibial vessels. Graft scan review patency of her left popliteal posterior tibial bypass with elevated velocities in her inflow vessels and the outflow tibial vessel.  Impression and Plan:  Excellent healing of a severely threatened left foot wound. She does have diffuse disease and we will continue to monitor this in our nonevasive vascular lab. She will continue her followup with Dr. [name] and see [place] in 3 months with vascular lab    Unita Detamore F [facility] Office: [phone]

## 2011-11-11 NOTE — Procedures (Unsigned)
LOWER EXTREMITY ARTERIAL EVALUATION-SINGLE LEVEL  INDICATION:  Followup arterial evaluation.  HISTORY: Diabetes:  Yes. Cardiac: Hypertension:  Yes. Smoking:  No. Previous Surgery:  Left below knee popliteal to posterior tibial bypass graft with saphenous vein 05/2011.  RESTING SYSTOLIC PRESSURES: (ABI)                         RIGHT                LEFT Brachial: Anterior tibial: Posterior tibial: Peroneal: DOPPLER WAVEFORM ANALYSIS: Anterior tibial: Posterior tibial: Peroneal:  PREVIOUS ABI'S:  Date: 04/2011 (Preop)  RIGHT:  0.61  LEFT:  0.48   10/18/2011  RIGHT 0.61  LEFT 0.79.  DUPLEX:  Right: Calcific and noncalcific plaque throughout with significant increased velocity in the proximal and mid femoral artery.  Left:  Significant noncalcific plaque in the proximal to distal thigh with increased velocity in the proximal to mid thigh.  The below knee popliteal to posterior tibial bypass graft is patent, but with severe increased velocity at the distal anastomosis.  Impression:       1.  Significant plaque in the bilateral SFA's with increased velocity.             2.  Patent left below knee politeal to posterior tibial BPG       with severe stenosis at the distal anastamosis.             3.  The right ABI remains stable. The left ABI is increased;       however, may be falsely  elevated.       ___________________________________________ Rosetta Posner, M.D.  SS/MEDQ  D:  11/03/2011  T:  11/03/2011  Job:  [id number]

## 2011-11-17 ENCOUNTER — Other Ambulatory Visit: Payer: Self-pay | Admitting: Cardiology

## 2011-11-17 ENCOUNTER — Other Ambulatory Visit: Payer: Self-pay | Admitting: Cardiovascular Disease

## 2011-11-17 ENCOUNTER — Other Ambulatory Visit: Payer: Self-pay | Admitting: Adult Health

## 2011-11-26 ENCOUNTER — Encounter: Payer: Self-pay | Admitting: Internal Medicine

## 2011-11-26 ENCOUNTER — Ambulatory Visit (INDEPENDENT_AMBULATORY_CARE_PROVIDER_SITE_OTHER): Payer: Medicare Other | Admitting: Internal Medicine

## 2011-11-26 DIAGNOSIS — I5022 Chronic systolic (congestive) heart failure: Secondary | ICD-10-CM

## 2011-11-26 DIAGNOSIS — I10 Essential (primary) hypertension: Secondary | ICD-10-CM

## 2011-11-26 DIAGNOSIS — Z95810 Presence of automatic (implantable) cardiac defibrillator: Secondary | ICD-10-CM

## 2011-11-26 DIAGNOSIS — I429 Cardiomyopathy, unspecified: Secondary | ICD-10-CM

## 2011-11-26 LAB — ICD DEVICE OBSERVATION
AL IMPEDENCE ICD: 342 Ohm
AL THRESHOLD: 0.875 V
BATTERY VOLTAGE: 3.1466 V
CHARGE TIME: 9.208 s
LV LEAD IMPEDENCE ICD: 342 Ohm
RV LEAD AMPLITUDE: 28.125 mv
TOT-0001: 1
TOT-0002: 0
TOT-0006: 20111228000000
TZAT-0001FASTVT: 1
TZAT-0001SLOWVT: 1
TZAT-0002ATACH: NEGATIVE
TZAT-0002ATACH: NEGATIVE
TZAT-0002FASTVT: NEGATIVE
TZAT-0012ATACH: 150 ms
TZAT-0012ATACH: 150 ms
TZAT-0012SLOWVT: 200 ms
TZAT-0013SLOWVT: 2
TZAT-0018FASTVT: NEGATIVE
TZAT-0018SLOWVT: NEGATIVE
TZAT-0019ATACH: 6 V
TZAT-0019ATACH: 6 V
TZAT-0019SLOWVT: 8 V
TZAT-0020ATACH: 1.5 ms
TZAT-0020ATACH: 1.5 ms
TZAT-0020SLOWVT: 1.5 ms
TZON-0003ATACH: 350 ms
TZON-0003SLOWVT: 320 ms
TZON-0003VSLOWVT: 400 ms
TZON-0004SLOWVT: 32
TZON-0005SLOWVT: 12
TZST-0001ATACH: 4
TZST-0001FASTVT: 2
TZST-0001FASTVT: 3
TZST-0001FASTVT: 4
TZST-0001FASTVT: 6
TZST-0001SLOWVT: 2
TZST-0001SLOWVT: 4
TZST-0002FASTVT: NEGATIVE
TZST-0002FASTVT: NEGATIVE
TZST-0002FASTVT: NEGATIVE
TZST-0003SLOWVT: 35 J
TZST-0003SLOWVT: 35 J
TZST-0003SLOWVT: 35 J
VENTRICULAR PACING ICD: 99.78 pct

## 2011-11-26 NOTE — Patient Instructions (Signed)
Your physician wants you to follow-up in: 1 year with Dr. Lovena Le. You will receive a reminder letter in the mail two months in advance. If you don't receive a letter, please call our office to schedule the follow-up appointment.  Your physician recommends that you schedule a follow-up appointment in: 3 months with Nevin Bloodgood for your device check  Your physician recommends that you continue on your current medications as directed. Please refer to the Current Medication list given to you today.

## 2011-11-26 NOTE — Assessment & Plan Note (Signed)
Her device is working normally. Will plan to recheck in several months.

## 2011-11-26 NOTE — Assessment & Plan Note (Signed)
Currently her symptoms are class 1-2. She will continue her current meds and maintain a low sodium diet.

## 2011-11-26 NOTE — Assessment & Plan Note (Signed)
Her blood pressure is elevated today and she notes that she has not yet taken her morning meds. I have encourage her not to miss her meds. She is also encouraged to maintain a low sodium diet.

## 2011-11-26 NOTE — Progress Notes (Signed)
HPI [name] returns today for followup. She is a pleasant middle age woman with a DCM, Chronic systolic CHF and DM. She has fairly severe peripheral vascular disease and underwent revascularization of her right leg several months ago and has had a nice improvement. The ulcer on her foot is nearly healed! She denies chest pain or sob. No claudication. Allergies  Allergen Reactions  . Hydrocodone-Acetaminophen      Current Outpatient Prescriptions  Medication Sig Dispense Refill  . aspirin 81 MG tablet Take 81 mg by mouth daily.        . Cholecalciferol (VITAMIN D3) 1000000 UNIT/GM LIQD 1,000 Units by Does not apply route every other day.       Marland Kitchen COREG 12.5 MG tablet TAKE 1 TABLET BY MOUTH TWICE A DAY.  180 each  3  . COZAAR 50 MG tablet TAKE 1 TABLET BY MOUTH TWICE DAILY.  60 each  6  . digoxin (LANOXIN) 0.125 MG tablet TAKE 1 TABLET BY MOUTH ONCE A DAY.  30 tablet  6  . docusate sodium (COLACE) 100 MG capsule Take 100 mg by mouth as needed.        . doxycycline (DORYX) 100 MG DR capsule Take 100 mg by mouth 2 (two) times daily.        . furosemide (LASIX) 20 MG tablet Take 1 tablet (20 mg total) by mouth daily as needed.  45 tablet  3  . hydrALAZINE (APRESOLINE) 100 MG tablet TAKE (1) TABLET BY MOUTH (3) TIMES DAILY.  90 tablet  6  . insulin glargine (LANTUS) 100 UNIT/ML injection Inject 40 Units into the skin daily.        . nitroGLYCERIN (NITROSTAT) 0.4 MG SL tablet Place 0.4 mg under the tongue every 5 (five) minutes as needed.        Marland Kitchen spironolactone (ALDACTONE) 25 MG tablet Take 0.5 tablets (12.5 mg total) by mouth daily.  15 each  6  . DISCONTD: digoxin (LANOXIN) 0.25 MG tablet Take 0.5 tablets (0.125 mg total) by mouth daily.  30 tablet  4  . DISCONTD: furosemide (LASIX) 20 MG tablet Take 1 tablet (20 mg total) by mouth daily.  90 tablet  1     Past Medical History  Diagnosis Date  . Diabetes mellitus, type 2   . Essential hypertension, benign   . Chronic systolic heart  failure   . Coronary atherosclerosis of native coronary artery     Nonobstructive  . Nonischemic cardiomyopathy     LVEF 20%  . PAD (peripheral artery disease)     Left foot gangrene  . CHF (congestive heart failure)   . Anxiety   . Depression   . Constipation   . Heart murmur     ROS:   All systems reviewed and negative except as noted in the HPI.   Past Surgical History  Procedure Date  . Abdominal hysterectomy   . Left shoulder surgery   . Breast lumpectomy   . Cardiac defibrillator placement     Medtronic [id number]   . Exploratory laparotomy with lysis of adhesions   . Bladder surgery 2011 BENIGN MASS REMOVED FROM BLADDER  . Eye surgery BILATERAL CATARACTS REMOVAL  10 YEARS AGO PER PATIENT     Family History  Problem Relation Age of Onset  . Cirrhosis Father   . Cancer Mother     Ovarian  . Ovarian cancer Mother      History   Social History  . Marital Status: Divorced  Spouse Name: N/A    Number of Children: 2  . Years of Education: N/A   Occupational History  . DISABLED   . [employer] [facility]-full time     (out of work)   Social History Main Topics  . Smoking status: Never Smoker   . Smokeless tobacco: Never Used   Comment: tobacco use-no  . Alcohol Use: No  . Drug Use: No  . Sexually Active: Not on file   Other Topics Concern  . Not on file   Social History Narrative  . No narrative on file     BP 184/72  Pulse 60  Resp 16  Ht 5\' 1"  (1.549 m)  Wt 77.565 kg (171 lb)  BMI 32.31 kg/m2  Physical Exam:  Well appearing middle age woman, NAD HEENT: Unremarkable Neck:  No JVD, no thyromegally Lymphatics:  No adenopathy Back:  No CVA tenderness Lungs:  Clear with no wheezes, rales, or rhonchi HEART:  Regular rate rhythm, no murmurs, no rubs, no clicks Abd:  soft, positive bowel sounds, no organomegally, no rebound, no guarding Ext:  2 plus pulses, no edema, no cyanosis, no clubbing Skin:  No rashes no nodules Neuro:  CN II  through XII intact, motor grossly intact  DEVICE  Normal device function.  See PaceArt for details.   Assess/Plan:

## 2012-01-11 ENCOUNTER — Telehealth: Payer: Self-pay | Admitting: Cardiology

## 2012-01-11 MED ORDER — FUROSEMIDE 20 MG PO TABS: 20.0000 mg | ORAL_TABLET | Freq: Every day | ORAL | Status: DC | PRN

## 2012-01-11 NOTE — Telephone Encounter (Signed)
**Note De-Identified [name] Obfuscation** RX sent to [place]. To fill, pt. Is aware.//LV

## 2012-01-11 NOTE — Telephone Encounter (Signed)
Patient would like Dr.McDowell to write her a RX for Lasix. / tg

## 2012-01-26 ENCOUNTER — Other Ambulatory Visit: Payer: Self-pay | Admitting: *Deleted

## 2012-01-26 DIAGNOSIS — I70219 Atherosclerosis of native arteries of extremities with intermittent claudication, unspecified extremity: Secondary | ICD-10-CM

## 2012-01-26 DIAGNOSIS — Z48812 Encounter for surgical aftercare following surgery on the circulatory system: Secondary | ICD-10-CM

## 2012-01-28 ENCOUNTER — Encounter: Payer: Self-pay | Admitting: Cardiology

## 2012-01-28 ENCOUNTER — Ambulatory Visit (INDEPENDENT_AMBULATORY_CARE_PROVIDER_SITE_OTHER): Payer: Medicare Other | Admitting: Cardiology

## 2012-01-28 VITALS — BP 210/98 | HR 64 | Resp 16 | Ht 61.0 in | Wt 171.0 lb

## 2012-01-28 DIAGNOSIS — I739 Peripheral vascular disease, unspecified: Secondary | ICD-10-CM

## 2012-01-28 DIAGNOSIS — I5022 Chronic systolic (congestive) heart failure: Secondary | ICD-10-CM

## 2012-01-28 DIAGNOSIS — Z95810 Presence of automatic (implantable) cardiac defibrillator: Secondary | ICD-10-CM

## 2012-01-28 DIAGNOSIS — I779 Disorder of arteries and arterioles, unspecified: Secondary | ICD-10-CM

## 2012-01-28 DIAGNOSIS — I10 Essential (primary) hypertension: Secondary | ICD-10-CM

## 2012-01-28 MED ORDER — HYDRALAZINE HCL 25 MG PO TABS: 25.0000 mg | ORAL_TABLET | Freq: Three times a day (TID) | ORAL | Status: DC

## 2012-01-28 NOTE — Patient Instructions (Signed)
**Note De-Identified [name] Obfuscation** Your physician has recommended you make the following change in your medication: decrease Hydralazine to 25 mg three times daily  Your physician recommends that you schedule a follow-up appointment in: 2 weeks for a blood pressure check and in 3 months with cardiologist

## 2012-01-28 NOTE — Assessment & Plan Note (Signed)
Keep followup with Dr. Lovena Le.

## 2012-01-28 NOTE — Assessment & Plan Note (Signed)
Followed by Dr. Early 

## 2012-01-28 NOTE — Progress Notes (Signed)
Clinical Summary [name] is a 66 y.o.female presenting for followup. She was seen in January. She presents with increased blood pressure, states that she has not been taking her Hydralazine as she felt it may have been causing dizziness. She also has a history of vertigo that complicates this picture.  She indicates stable NYHA class II-III dyspnea on exertion. No chest pain or palpitations. Does complain of fatigue. No device discharges. Her weight has been stable.  We discussed her medications and options. Systolic blood pressure at home is reportedly in the 140-180 range.   Allergies  Allergen Reactions  . Hydrocodone-Acetaminophen     Current Outpatient Prescriptions  Medication Sig Dispense Refill  . aspirin 81 MG tablet Take 81 mg by mouth daily.        . Cholecalciferol (VITAMIN D3) 1000000 UNIT/GM LIQD 1,000 Units by Does not apply route every other day.       Marland Kitchen COREG 12.5 MG tablet TAKE 1 TABLET BY MOUTH TWICE A DAY.  180 each  3  . COZAAR 50 MG tablet TAKE 1 TABLET BY MOUTH TWICE DAILY.  60 each  6  . digoxin (LANOXIN) 0.125 MG tablet TAKE 1 TABLET BY MOUTH ONCE A DAY.  30 tablet  6  . docusate sodium (COLACE) 100 MG capsule Take 100 mg by mouth as needed.        . doxycycline (DORYX) 100 MG DR capsule Take 100 mg by mouth 2 (two) times daily.        . furosemide (LASIX) 20 MG tablet Take 20 mg by mouth daily.      . hydrALAZINE (APRESOLINE) 25 MG tablet Take 1 tablet (25 mg total) by mouth 3 (three) times daily.  90 tablet  1  . insulin glargine (LANTUS) 100 UNIT/ML injection Inject 40 Units into the skin daily.        . nitroGLYCERIN (NITROSTAT) 0.4 MG SL tablet Place 0.4 mg under the tongue every 5 (five) minutes as needed.        Marland Kitchen spironolactone (ALDACTONE) 25 MG tablet Take 0.5 tablets (12.5 mg total) by mouth daily.  15 each  6  . DISCONTD: furosemide (LASIX) 20 MG tablet Take 1 tablet (20 mg total) by mouth daily as needed.  45 tablet  3  . DISCONTD: furosemide  (LASIX) 20 MG tablet Take 1 tablet (20 mg total) by mouth daily.  90 tablet  1    Past Medical History  Diagnosis Date  . Diabetes mellitus, type 2   . Essential hypertension, benign   . Chronic systolic heart failure   . Coronary atherosclerosis of native coronary artery     Nonobstructive  . Nonischemic cardiomyopathy     LVEF 20%  . PAD (peripheral artery disease)     Left foot gangrene  . CHF (congestive heart failure)   . Anxiety   . Depression   . Constipation   . Heart murmur     Past Surgical History  Procedure Date  . Abdominal hysterectomy   . Left shoulder surgery   . Breast lumpectomy   . Cardiac defibrillator placement     Medtronic [id number]   . Exploratory laparotomy with lysis of adhesions   . Bladder surgery 2011 BENIGN MASS REMOVED FROM BLADDER  . Eye surgery BILATERAL CATARACTS REMOVAL  10 YEARS AGO PER PATIENT    Social History [name] reports that she has never smoked. She has never used smokeless tobacco. [name] reports that she does not  drink alcohol.  Review of Systems Negative except as outlined above.  Physical Examination Filed Vitals:   01/28/12 0819  BP: 210/98  Pulse: 64  Resp: 16   Overweight woman in no acute distress.  HEENT: Conjunctiva and lids normal, oropharynx clear.  Neck: Supple, no carotid bruis, no thyromegaly.  Lungs: Clear with diminished breath sounds, nonlabored.  Cardiac: Indistinct PMI, regular rate and rhythm, paradoxically split S2, no S3.  Thorax: Well-healed device pocket site on the upper left.  Abdomen: Soft, nontender, no hepatomegaly, bowel sounds present.  Extremities: Trace ankle edema, diminished distal pulses..  Skin: Warm and dry.  Neuropsychiatric: Alert and oriented x3, affect appropriate.     Problem List and Plan

## 2012-01-28 NOTE — Assessment & Plan Note (Signed)
Blood pressure not well controlled. Reviewed her medications. Will restart Hydralazine at 25 mg TID. Nurse blood pressure check in 2 weeks. If tolerates can uptitrate versus change to Norvasc.

## 2012-01-28 NOTE — Assessment & Plan Note (Signed)
Symptoms and weight are stable. Plan to restart Hydralazine at lower dose primarily for blood pressure control. Could also add nitrate or Norvasc if needed. Discussed diet and sodium restriction. Followup arranged.

## 2012-02-01 ENCOUNTER — Encounter: Payer: Self-pay | Admitting: Vascular Surgery

## 2012-02-02 ENCOUNTER — Ambulatory Visit: Payer: Medicare Other | Admitting: Vascular Surgery

## 2012-02-05 ENCOUNTER — Encounter: Payer: Self-pay | Admitting: Internal Medicine

## 2012-02-05 ENCOUNTER — Ambulatory Visit (INDEPENDENT_AMBULATORY_CARE_PROVIDER_SITE_OTHER): Payer: Medicare Other | Admitting: *Deleted

## 2012-02-05 ENCOUNTER — Telehealth: Payer: Self-pay | Admitting: Cardiology

## 2012-02-05 DIAGNOSIS — I5022 Chronic systolic (congestive) heart failure: Secondary | ICD-10-CM

## 2012-02-05 DIAGNOSIS — I428 Other cardiomyopathies: Secondary | ICD-10-CM

## 2012-02-05 LAB — ICD DEVICE OBSERVATION
AL IMPEDENCE ICD: 342 Ohm
AL THRESHOLD: 0.875 V
BAMS-0001: 170 {beats}/min
BATTERY VOLTAGE: 3.1398 V
FVT: 0
PACEART VT: 0
RV LEAD AMPLITUDE: 25.25 mv
RV LEAD THRESHOLD: 0.75 V
TOT-0002: 0
TZAT-0001FASTVT: 1
TZAT-0001SLOWVT: 1
TZAT-0002ATACH: NEGATIVE
TZAT-0004SLOWVT: 8
TZAT-0012FASTVT: 200 ms
TZAT-0018ATACH: NEGATIVE
TZAT-0018FASTVT: NEGATIVE
TZAT-0019ATACH: 6 V
TZAT-0019ATACH: 6 V
TZAT-0020ATACH: 1.5 ms
TZAT-0020ATACH: 1.5 ms
TZAT-0020SLOWVT: 1.5 ms
TZON-0003SLOWVT: 320 ms
TZST-0001ATACH: 4
TZST-0001ATACH: 5
TZST-0001FASTVT: 2
TZST-0001FASTVT: 4
TZST-0001FASTVT: 6
TZST-0001SLOWVT: 2
TZST-0001SLOWVT: 4
TZST-0002ATACH: NEGATIVE
TZST-0002ATACH: NEGATIVE
TZST-0002FASTVT: NEGATIVE
TZST-0002FASTVT: NEGATIVE
TZST-0003SLOWVT: 20 J
TZST-0003SLOWVT: 35 J
VF: 0

## 2012-02-05 NOTE — Telephone Encounter (Signed)
Please advise 

## 2012-02-05 NOTE — Progress Notes (Signed)
ICD check with ICM 

## 2012-02-05 NOTE — Telephone Encounter (Signed)
Patient states that her BP has been up since she has been "splitting her Hydralazine".  Wants to know if she needs to go back to taking whole pill. / tg

## 2012-02-08 NOTE — Telephone Encounter (Signed)
Not sure what she means by "splitting" hydralazine. At her recent visit we actually added back hydralazine 25 mg 3 times a day, and she was to followup for a nurse blood pressure check. She had apparently had some difficulty tolerating hydralazine before. If she is tolerating hydralazine 25 mg 3 times a day and her blood pressure stays elevated, we may need to increase it further to 50 mg 3 times a day. Otherwise a different antihypertensive may need to be considered.

## 2012-02-09 MED ORDER — AMLODIPINE BESYLATE 5 MG PO TABS: 5.0000 mg | ORAL_TABLET | Freq: Every day | ORAL | Status: DC

## 2012-02-09 MED ORDER — HYDRALAZINE HCL 50 MG PO TABS: 50.0000 mg | ORAL_TABLET | Freq: Three times a day (TID) | ORAL | Status: DC

## 2012-02-09 NOTE — Telephone Encounter (Addendum)
**Note De-Identified Cora Brierley Obfuscation** RX sent to [place]. To fill. LMOM.LV

## 2012-02-09 NOTE — Telephone Encounter (Signed)
**Note De-Identified [name] Obfuscation** Pt. states that her Hydralazine tablets are 50 mg so she is splitting them in half to equal 25 mg TID. She states that her BP was so elevated (Ex: 200/100, 190/80, 208/88) that she increased Hydrazine to 50 mg TID on her own on 5-3 and has been taking that amount since but her BP remains elevated, BP was 219/100 when she woke this morning.  Pt. Also states that the reason she was not tolerating Hydralazine in the past is b/c she was taking on an empty stomach but is now eating before she takes so she is tolerating at this time . Please advise./LV

## 2012-02-09 NOTE — Telephone Encounter (Signed)
**Note De-Identified [name] Obfuscation** Pt. advised, she verbalized understanding. BP check scheduled for 5-14 at 10 am./LV

## 2012-02-09 NOTE — Telephone Encounter (Signed)
Take hydralazine 50 mg 3 times a day, and add Norvasc 5 mg daily.

## 2012-02-17 ENCOUNTER — Ambulatory Visit (INDEPENDENT_AMBULATORY_CARE_PROVIDER_SITE_OTHER): Payer: Medicare Other

## 2012-02-17 VITALS — BP 152/63 | HR 59 | Ht 61.0 in | Wt 170.0 lb

## 2012-02-17 DIAGNOSIS — I10 Essential (primary) hypertension: Secondary | ICD-10-CM

## 2012-02-17 NOTE — Patient Instructions (Signed)
Advised patient that we would notify her with any further recommendations from Dr. [name].

## 2012-02-17 NOTE — Progress Notes (Signed)
No change in current regimen. 

## 2012-02-17 NOTE — Progress Notes (Signed)
Presents for 2 week blood pressure check.  Comes with 2 pages of blood pressure readings in the 123456 range systolic.  Checked her blood pressure monitor with our readings and is off by 50 points.  Suggested that she have cuff calibrated or purchase another cuff.  Medication list reconciled and states taking all medications, as prescribed.  Last dose at 8 am.  States feeling fatigued, otherwise no complaints noted.

## 2012-02-18 NOTE — Progress Notes (Signed)
**Note De-Identified Linda Grimmer Obfuscation** Pt. Advised, she verbalized understanding./LV

## 2012-02-19 ENCOUNTER — Telehealth: Payer: Self-pay

## 2012-02-19 NOTE — Telephone Encounter (Signed)
**Note De-Identified [name] Obfuscation** Pt. had BP check on 5-15 and per Dr. [name] was advised to continue current medical treatment. Pt's BP this am is 156/69 and she is concerned that this is to high. Appt. Scheduled with Arnold Long, NP on 5-21 at 11:40, pt. aware./LV

## 2012-02-23 ENCOUNTER — Encounter: Payer: Self-pay | Admitting: Adult Health

## 2012-02-23 ENCOUNTER — Ambulatory Visit (INDEPENDENT_AMBULATORY_CARE_PROVIDER_SITE_OTHER): Payer: Medicare Other | Admitting: Adult Health

## 2012-02-23 VITALS — BP 191/71 | HR 59 | Resp 16 | Ht 61.0 in | Wt 166.0 lb

## 2012-02-23 DIAGNOSIS — I779 Disorder of arteries and arterioles, unspecified: Secondary | ICD-10-CM

## 2012-02-23 DIAGNOSIS — I10 Essential (primary) hypertension: Secondary | ICD-10-CM

## 2012-02-23 DIAGNOSIS — I5022 Chronic systolic (congestive) heart failure: Secondary | ICD-10-CM

## 2012-02-23 DIAGNOSIS — I2510 Atherosclerotic heart disease of native coronary artery without angina pectoris: Secondary | ICD-10-CM

## 2012-02-23 LAB — BASIC METABOLIC PANEL
BUN: 30 mg/dL — ABNORMAL HIGH (ref 6–23)
Calcium: 9 mg/dL (ref 8.4–10.5)
Creat: 1.2 mg/dL — ABNORMAL HIGH (ref 0.50–1.10)
Glucose, Bld: 257 mg/dL — ABNORMAL HIGH (ref 70–99)
Sodium: 141 mEq/L (ref 135–145)

## 2012-02-23 MED ORDER — CARVEDILOL 25 MG PO TABS: 25.0000 mg | ORAL_TABLET | Freq: Two times a day (BID) | ORAL | Status: DC

## 2012-02-23 MED ORDER — AMLODIPINE BESYLATE 10 MG PO TABS: 10.0000 mg | ORAL_TABLET | Freq: Every day | ORAL | Status: DC

## 2012-02-23 NOTE — Assessment & Plan Note (Signed)
No evidence of fluid overload at this time. She is not compliant with low salt diet. I have again reinforced the need to avoid salt altogether. Wt is down 5 lbs since last visit, however. May need follow-up Echo at some point with harsh systolic murmur.

## 2012-02-23 NOTE — Progress Notes (Signed)
HPI: [name] is a 66 y/o patient of Dr.McDowell we are seeing on follow-up with known history of uncontrolled hypertension, nonischemic CM with EF of 20%, s/p BiV ICD, and PAD. She was last seen by Dr. [name] who increased antihypertensives add hydralazine, with increased dose to 50 mg TID from 25mg  TID started initially. Norvasc 5 mg was also added after last seen. She comes today with continued BP elevation. She states she is taking her medications as directed. She is easily anxious and states that it contributes to her BP elevation, but not always. She comes today with continued concerns about her BP. Denies chest pain, dizziness, or headache.  Allergies  Allergen Reactions  . Hydrocodone-Acetaminophen     Current Outpatient Prescriptions  Medication Sig Dispense Refill  . amLODipine (NORVASC) 10 MG tablet Take 1 tablet (10 mg total) by mouth daily.  30 tablet  6  . aspirin 81 MG tablet Take 81 mg by mouth daily.        . carvedilol (COREG) 25 MG tablet Take 1 tablet (25 mg total) by mouth 2 (two) times daily with a meal.  60 tablet  6  . Cholecalciferol (VITAMIN D3) 1000000 UNIT/GM LIQD 1,000 Units by Does not apply route every other day.       Marland Kitchen COZAAR 50 MG tablet TAKE 1 TABLET BY MOUTH TWICE DAILY.  60 each  6  . digoxin (LANOXIN) 0.125 MG tablet TAKE 1 TABLET BY MOUTH ONCE A DAY.  30 tablet  6  . docusate sodium (COLACE) 100 MG capsule Take 100 mg by mouth as needed.        . doxycycline (DORYX) 100 MG DR capsule Take 100 mg by mouth 2 (two) times daily.        . furosemide (LASIX) 20 MG tablet Take 20 mg by mouth daily.      . hydrALAZINE (APRESOLINE) 50 MG tablet Take 1 tablet (50 mg total) by mouth 3 (three) times daily.  90 tablet  1  . insulin glargine (LANTUS) 100 UNIT/ML injection Inject 40 Units into the skin daily.        . nitroGLYCERIN (NITROSTAT) 0.4 MG SL tablet Place 0.4 mg under the tongue every 5 (five) minutes as needed.        Marland Kitchen spironolactone (ALDACTONE)  25 MG tablet Take 0.5 tablets (12.5 mg total) by mouth daily.  15 each  6  . DISCONTD: amLODipine (NORVASC) 5 MG tablet Take 1 tablet (5 mg total) by mouth daily.  30 tablet  6  . DISCONTD: COREG 12.5 MG tablet TAKE 1 TABLET BY MOUTH TWICE A DAY.  180 each  3  . DISCONTD: furosemide (LASIX) 20 MG tablet Take 1 tablet (20 mg total) by mouth daily.  90 tablet  1    Past Medical History  Diagnosis Date  . Diabetes mellitus, type 2   . Essential hypertension, benign   . Chronic systolic heart failure   . Coronary atherosclerosis of native coronary artery     Nonobstructive  . Nonischemic cardiomyopathy     LVEF 20%  . PAD (peripheral artery disease)     Left foot gangrene  . CHF (congestive heart failure)   . Anxiety   . Depression   . Constipation   . Heart murmur     Past Surgical History  Procedure Date  . Abdominal hysterectomy   . Left shoulder surgery   . Breast lumpectomy   . Cardiac defibrillator placement  Medtronic [id number]   . Exploratory laparotomy with lysis of adhesions   . Bladder surgery 2011 BENIGN MASS REMOVED FROM BLADDER  . Eye surgery BILATERAL CATARACTS REMOVAL  10 YEARS AGO PER PATIENT    [id number] of systems complete and found to be negative unless listed above  PHYSICAL EXAM BP 191/71  Pulse 59  Resp 16  Ht 5\' 1"  (1.549 m)  Wt 166 lb (75.297 kg)  BMI 31.37 kg/m2  General: Well developed, well nourished, in no acute distress Head: Eyes PERRLA, No xanthomas.   Normal cephalic and atramatic  Lungs: Clear bilaterally to auscultation and percussion. Heart: HRRR S1 S2, with 2/6 systolic murmur, radiation into the carotids bilaterally, along with abdomen.  Pulses are 2+ & equal.          . No JVD.  No abdominal bruits. No femoral bruits. Abdomen: Bowel sounds are positive, abdomen soft and non-tender without masses or                  Hernia's noted. Msk:  Back normal, normal gait. Normal strength and tone for age. Extremities: No clubbing,  cyanosis or edema.  DP +1 Neuro: Alert and oriented X 3. Psych:  Good affect, responds appropriately  EKG: Paced rhythm.  ASSESSMENT AND PLAN

## 2012-02-23 NOTE — Patient Instructions (Addendum)
**Note De-Identified Karel Mowers Obfuscation** Your physician has recommended you make the following change in your medication: increase Norvasc to 10 mg daily and Coreg to 25 mg twice daily  Your physician has recommended that you have a sleep study. This test records several body functions during sleep, including: brain activity, eye movement, oxygen and carbon dioxide blood levels, heart rate and rhythm, breathing rate and rhythm, the flow of air through your mouth and nose, snoring, body muscle movements, and chest and belly movement.  Your physician recommends that you return for lab work in: today  Your physician recommends that you schedule a follow-up appointment in: 1 months

## 2012-02-23 NOTE — Assessment & Plan Note (Signed)
I have checked BP in both arms manually. BP is consistent with triage BP recordings. I have reviewed records, that demonstrated she had a renal artery ultrasound in 2011 without evidence of stenosis. She has no history of sleep apnea, but has not been tested for this.   I will increase coreg to 25 mg BID, and norvasc to 10 mg BID.  Sleep study will be ordered. Can potentially increase hydralazine dose as well but will check status in a week or so first.

## 2012-03-02 ENCOUNTER — Other Ambulatory Visit: Payer: Self-pay

## 2012-03-02 MED ORDER — AMLODIPINE BESYLATE 10 MG PO TABS: 10.0000 mg | ORAL_TABLET | Freq: Two times a day (BID) | ORAL | Status: DC

## 2012-03-03 ENCOUNTER — Telehealth: Payer: Self-pay | Admitting: Cardiology

## 2012-03-03 NOTE — Telephone Encounter (Signed)
Patient states that she went to pick up Amlodipine and it was not at the pharmacy. / tg

## 2012-03-03 NOTE — Telephone Encounter (Signed)
**Note De-Identified Zimri Brennen Obfuscation** Pharmacist at [facility] states that insurance will not pay for increased dose of Amlodipine since pt. got refill for 30 tablets just before last OV. He states that insurance will pay for increased dose in a couple more days and that he will call pt. To explain why her RX was not filled./LV

## 2012-03-21 ENCOUNTER — Encounter: Payer: Self-pay | Admitting: Neurosurgery

## 2012-03-22 ENCOUNTER — Ambulatory Visit (INDEPENDENT_AMBULATORY_CARE_PROVIDER_SITE_OTHER): Payer: Medicare Other | Admitting: Neurosurgery

## 2012-03-22 ENCOUNTER — Encounter (INDEPENDENT_AMBULATORY_CARE_PROVIDER_SITE_OTHER): Payer: Medicare Other | Admitting: *Deleted

## 2012-03-22 VITALS — BP 126/50 | HR 55 | Resp 16 | Ht 61.0 in | Wt 174.9 lb

## 2012-03-22 DIAGNOSIS — I70219 Atherosclerosis of native arteries of extremities with intermittent claudication, unspecified extremity: Secondary | ICD-10-CM

## 2012-03-22 DIAGNOSIS — Z48812 Encounter for surgical aftercare following surgery on the circulatory system: Secondary | ICD-10-CM

## 2012-03-22 NOTE — Progress Notes (Signed)
[facility] [place] HISTORY AND PHYSICAL   CC: Six-month graft duplex and ABIs Referring Physician: Early  History of Present Illness: 66 year old female patient of Dr. Donnetta Hutching is who is status post a below the knee popliteal to posterior tibial bypass graft in August 2012. Patient denies any signs or symptoms claudication, rest pain and has no open wounds on her lower extremities. She does have a postoperative wound to her left foot per Dr. [name]. Otherwise patient's doing well and has no difficulties.  Past Medical History  Diagnosis Date  . Diabetes mellitus, type 2   . Essential hypertension, benign   . Chronic systolic heart failure   . Coronary atherosclerosis of native coronary artery     Nonobstructive  . Nonischemic cardiomyopathy     LVEF 20%  . PAD (peripheral artery disease)     Left foot gangrene  . CHF (congestive heart failure)   . Anxiety   . Depression   . Constipation   . Heart murmur     ROS: [x]  Positive   [ ]  Denies    General: [ ]  Weight loss, [ ]  Fever, [ ]  chills Neurologic: [ ]  Dizziness, [ ]  Blackouts, [ ]  Seizure [ ]  Stroke, [ ]  "Mini stroke", [ ]  Slurred speech, [ ]  Temporary blindness; [ ]  weakness in arms or legs, [ ]  Hoarseness Cardiac: [ ]  Chest pain/pressure, [ ]  Shortness of breath at rest [ ]  Shortness of breath with exertion, [ ]  Atrial fibrillation or irregular heartbeat Vascular: [ ]  Pain in legs with walking, [ ]  Pain in legs at rest, [ ]  Pain in legs at night,  [ ]  Non-healing ulcer, [ ]  Blood clot in vein/DVT,   Pulmonary: [ ]  Home oxygen, [ ]  Productive cough, [ ]  Coughing up blood, [ ]  Asthma,  [ ]  Wheezing Musculoskeletal:  [ ]  Arthritis, [ ]  Low back pain, [ ]  Joint pain Hematologic: [ ]  Easy Bruising, [ ]  Anemia; [ ]  Hepatitis Gastrointestinal: [ ]  Blood in stool, [ ]  Gastroesophageal Reflux/heartburn, [ ]  Trouble swallowing Urinary: [ ]  chronic Kidney disease, [ ]  on HD - [ ]  MWF or [ ]  TTHS, [ ]  Burning with  urination, [ ]  Difficulty urinating Skin: [ ]  Rashes, [ ]  Wounds Psychological: [ ]  Anxiety, [ ]  Depression   Social History History  Substance Use Topics  . Smoking status: Never Smoker   . Smokeless tobacco: Never Used   Comment: tobacco use-no  . Alcohol Use: No    Family History Family History  Problem Relation Age of Onset  . Cirrhosis Father   . Cancer Mother     Ovarian  . Ovarian cancer Mother     Allergies  Allergen Reactions  . Hydrocodone-Acetaminophen     Current Outpatient Prescriptions  Medication Sig Dispense Refill  . amLODipine (NORVASC) 10 MG tablet Take 1 tablet (10 mg total) by mouth 2 (two) times daily.  60 tablet  6  . aspirin 81 MG tablet Take 81 mg by mouth daily.        . carvedilol (COREG) 25 MG tablet Take 1 tablet (25 mg total) by mouth 2 (two) times daily with a meal.  60 tablet  6  . Cholecalciferol (VITAMIN D3) 1000000 UNIT/GM LIQD 1,000 Units by Does not apply route every other day.       Marland Kitchen COZAAR 50 MG tablet TAKE 1 TABLET BY MOUTH TWICE DAILY.  60 each  6  . digoxin (LANOXIN) 0.125  MG tablet TAKE 1 TABLET BY MOUTH ONCE A DAY.  30 tablet  6  . docusate sodium (COLACE) 100 MG capsule Take 100 mg by mouth as needed.        . doxycycline (DORYX) 100 MG DR capsule Take 100 mg by mouth 2 (two) times daily.        . furosemide (LASIX) 20 MG tablet Take 20 mg by mouth daily.      . hydrALAZINE (APRESOLINE) 50 MG tablet Take 1 tablet (50 mg total) by mouth 3 (three) times daily.  90 tablet  1  . insulin glargine (LANTUS) 100 UNIT/ML injection Inject 40 Units into the skin daily.        . nitroGLYCERIN (NITROSTAT) 0.4 MG SL tablet Place 0.4 mg under the tongue every 5 (five) minutes as needed.        Marland Kitchen spironolactone (ALDACTONE) 25 MG tablet Take 0.5 tablets (12.5 mg total) by mouth daily.  15 each  6  . DISCONTD: furosemide (LASIX) 20 MG tablet Take 1 tablet (20 mg total) by mouth daily.  90 tablet  1    Physical Examination  Filed Vitals:    03/22/12 1016  BP: 126/50  Pulse: 55  Resp: 16    Body mass index is 33.05 kg/(m^2).  General:  WDWN in NAD Gait: Normal HEENT: WNL Eyes: Pupils equal Pulmonary: normal non-labored breathing , without Rales, rhonchi,  wheezing Cardiac: RRR, without  Murmurs, rubs or gallops; No carotid bruits Abdomen: soft, NT, no masses Skin: no rashes, ulcers noted Vascular Exam/Pulses: 2+ radial pulses bilaterally, femoral pulses are palpable lower extremity pulses are dampened  Extremities without ischemic changes, no Gangrene , no cellulitis; no open wounds;  Musculoskeletal: no muscle wasting or atrophy  Neurologic: A&O X 3; Appropriate Affect ; SENSATION: normal; MOTOR FUNCTION:  moving all extremities equally. Speech is fluent/normal  Non-Invasive Vascular Imaging: ABIs today are 0.59 on the right and 0.51 on the left which is a slight decrease from previous. Bypass graft evaluation shows some elevations along the graft, this was reviewed with Dr. Donnetta Hutching who is okay with following the patient with surveillance.  ASSESSMENT/PLAN: This patient is now 10 months status post left lower extremity bypass graft without symptoms. She will followup in 6 months for repeat lower extremity ABIs and left lower extremity graft duplex. Her questions were encouraged and answered, she is in agreement with this plan.  Beatris Ship ANP  Clinic M.D.: Early

## 2012-04-05 ENCOUNTER — Ambulatory Visit (INDEPENDENT_AMBULATORY_CARE_PROVIDER_SITE_OTHER): Payer: Medicare Other | Admitting: Cardiology

## 2012-04-05 ENCOUNTER — Encounter: Payer: Self-pay | Admitting: Cardiology

## 2012-04-05 VITALS — BP 158/70 | HR 60 | Ht 61.0 in | Wt 178.0 lb

## 2012-04-05 DIAGNOSIS — I429 Cardiomyopathy, unspecified: Secondary | ICD-10-CM

## 2012-04-05 DIAGNOSIS — I10 Essential (primary) hypertension: Secondary | ICD-10-CM

## 2012-04-05 DIAGNOSIS — I5022 Chronic systolic (congestive) heart failure: Secondary | ICD-10-CM

## 2012-04-05 DIAGNOSIS — I2510 Atherosclerotic heart disease of native coronary artery without angina pectoris: Secondary | ICD-10-CM

## 2012-04-05 DIAGNOSIS — Z95810 Presence of automatic (implantable) cardiac defibrillator: Secondary | ICD-10-CM

## 2012-04-05 NOTE — Assessment & Plan Note (Signed)
Weight up 4 pounds from last visit. We discussed diet and sodium restriction. She has had no change in her dyspnea. Followup echocardiogram to be obtained for her next visit.

## 2012-04-05 NOTE — Assessment & Plan Note (Signed)
Keep followup with Dr. Lovena Le.

## 2012-04-05 NOTE — Assessment & Plan Note (Signed)
Nonobstructive disease, no active angina. Continue aspirin.

## 2012-04-05 NOTE — Progress Notes (Signed)
Clinical Summary [name] is a 66 y.o.female presenting for followup. She was last seen in May by [name]. Antihypertensives were adjusted at that time.  Lab work from May showed potassium 4.4, BUN 30, creatinine 1.2. Last echocardiogram was in September 2012, LVEF 20-25%, mild mitral regurgitation, mildly calcified aortic valve, mild tricuspid regurgitation, small pericardial effusion.  She had peripheral vascular followup in June status post left below the knee popliteal to posterior tibial bypass in August 2012. ABIs were 0.59 on the right and 0.51 on the left.  Sheet she states that she feels better in general, stable dyspnea on exertion, no chest pain. Blood pressure trend is better. She reports systolics typically in the 140s at home.  Allergies  Allergen Reactions  . Hydrocodone-Acetaminophen     Current Outpatient Prescriptions  Medication Sig Dispense Refill  . amLODipine (NORVASC) 10 MG tablet Take 1 tablet (10 mg total) by mouth 2 (two) times daily.  60 tablet  6  . aspirin 81 MG tablet Take 81 mg by mouth daily.        . carvedilol (COREG) 25 MG tablet Take 1 tablet (25 mg total) by mouth 2 (two) times daily with a meal.  60 tablet  6  . Cholecalciferol (VITAMIN D3) 1000000 UNIT/GM LIQD 1,000 Units by Does not apply route every other day.       Marland Kitchen COZAAR 50 MG tablet TAKE 1 TABLET BY MOUTH TWICE DAILY.  60 each  6  . digoxin (LANOXIN) 0.125 MG tablet TAKE 1 TABLET BY MOUTH ONCE A DAY.  30 tablet  6  . docusate sodium (COLACE) 100 MG capsule Take 100 mg by mouth as needed.        . doxycycline (DORYX) 100 MG DR capsule Take 100 mg by mouth 2 (two) times daily.        . furosemide (LASIX) 20 MG tablet Take 20 mg by mouth daily.      . hydrALAZINE (APRESOLINE) 50 MG tablet Take 1 tablet (50 mg total) by mouth 3 (three) times daily.  90 tablet  1  . insulin glargine (LANTUS) 100 UNIT/ML injection Inject 40 Units into the skin daily.        . nitroGLYCERIN (NITROSTAT)  0.4 MG SL tablet Place 0.4 mg under the tongue every 5 (five) minutes as needed.        Marland Kitchen spironolactone (ALDACTONE) 25 MG tablet Take 0.5 tablets (12.5 mg total) by mouth daily.  15 each  6  . DISCONTD: furosemide (LASIX) 20 MG tablet Take 1 tablet (20 mg total) by mouth daily.  90 tablet  1    Past Medical History  Diagnosis Date  . Diabetes mellitus, type 2   . Essential hypertension, benign   . Chronic systolic heart failure   . Coronary atherosclerosis of native coronary artery     Nonobstructive  . Nonischemic cardiomyopathy     LVEF 20%  . PAD (peripheral artery disease)     Left foot gangrene  . Anxiety   . Depression     Social History [name] reports that she has never smoked. She has never used smokeless tobacco. [name] reports that she does not drink alcohol.  Review of Systems No palpitations, no syncope. Stable appetite. No progressive claudication. She has had some vision problems, has followup pending. Otherwise negative.  Physical Examination Filed Vitals:   04/05/12 0827  BP: 158/70  Pulse: 60    Overweight woman in no acute distress.  HEENT: Conjunctiva and lids normal, oropharynx clear.  Neck: Supple, no carotid bruis, no thyromegaly.  Lungs: Clear with diminished breath sounds, nonlabored.  Cardiac: Indistinct PMI, regular rate and rhythm, 2/6 systolic murmur at the base, paradoxically split S2, no S3.  Thorax: Well-healed device pocket site on the upper left.  Abdomen: Soft, nontender, no hepatomegaly, bowel sounds present.  Extremities: Trace ankle edema, diminished distal pulses..  Skin: Warm and dry.  Neuropsychiatric: Alert and oriented x3, affect appropriate.    Problem List and Plan   CORONARY ATHEROSCLEROSIS NATIVE CORONARY ARTERY Nonobstructive disease, no active angina. Continue aspirin.  ESSENTIAL HYPERTENSION, BENIGN Blood pressure trend is better. BMET scheduled for next followup visit. No changes were made in current  regimen.  Chronic systolic heart failure Weight up 4 pounds from last visit. We discussed diet and sodium restriction. She has had no change in her dyspnea. Followup echocardiogram to be obtained for her next visit.  Biventricular ICD (implantable cardiac defibrillator) in place Keep followup with Dr. Lovena Le.    [name] Sark, M.D., F.A.C.C.

## 2012-04-05 NOTE — Patient Instructions (Addendum)
**Note De-identified [name] Obfuscation** Your physician has requested that you have an echocardiogram. Echocardiography is a painless test that uses sound waves to create images of your heart. It provides your doctor with information about the size and shape of your heart and how well your heart's chambers and valves are working. This procedure takes approximately one hour. There are no restrictions for this procedure.  Your physician recommends that you continue on your current medications as directed. Please refer to the Current Medication list given to you today.  Your physician recommends that you schedule a follow-up appointment in: 3 months  

## 2012-04-05 NOTE — Assessment & Plan Note (Signed)
Blood pressure trend is better. BMET scheduled for next followup visit. No changes were made in current regimen.

## 2012-04-12 ENCOUNTER — Ambulatory Visit (HOSPITAL_COMMUNITY)
Admission: RE | Admit: 2012-04-12 | Discharge: 2012-04-12 | Disposition: A | Discharge status: Home / Self Care | Payer: Medicare Other | Source: Ambulatory Visit | Attending: Cardiology | Admitting: Cardiology

## 2012-04-12 DIAGNOSIS — I10 Essential (primary) hypertension: Secondary | ICD-10-CM | POA: Insufficient documentation

## 2012-04-12 DIAGNOSIS — I319 Disease of pericardium, unspecified: Secondary | ICD-10-CM

## 2012-04-12 DIAGNOSIS — I2510 Atherosclerotic heart disease of native coronary artery without angina pectoris: Secondary | ICD-10-CM | POA: Insufficient documentation

## 2012-04-12 DIAGNOSIS — I429 Cardiomyopathy, unspecified: Secondary | ICD-10-CM

## 2012-04-12 DIAGNOSIS — I428 Other cardiomyopathies: Secondary | ICD-10-CM | POA: Insufficient documentation

## 2012-04-12 DIAGNOSIS — I447 Left bundle-branch block, unspecified: Secondary | ICD-10-CM | POA: Insufficient documentation

## 2012-04-12 DIAGNOSIS — E785 Hyperlipidemia, unspecified: Secondary | ICD-10-CM | POA: Insufficient documentation

## 2012-04-12 DIAGNOSIS — I517 Cardiomegaly: Secondary | ICD-10-CM | POA: Insufficient documentation

## 2012-04-12 NOTE — Progress Notes (Signed)
*  PRELIMINARY RESULTS* Echocardiogram 2D Echocardiogram has been performed.  Wynonia Lawman 04/12/2012, 8:28 AM

## 2012-04-21 ENCOUNTER — Encounter (INDEPENDENT_AMBULATORY_CARE_PROVIDER_SITE_OTHER): Payer: Medicare Other | Admitting: Ophthalmology

## 2012-04-21 DIAGNOSIS — H4310 Vitreous hemorrhage, unspecified eye: Secondary | ICD-10-CM

## 2012-04-21 DIAGNOSIS — H35349 Macular cyst, hole, or pseudohole, unspecified eye: Secondary | ICD-10-CM

## 2012-04-21 DIAGNOSIS — E11319 Type 2 diabetes mellitus with unspecified diabetic retinopathy without macular edema: Secondary | ICD-10-CM

## 2012-04-21 DIAGNOSIS — I10 Essential (primary) hypertension: Secondary | ICD-10-CM

## 2012-04-21 DIAGNOSIS — H35039 Hypertensive retinopathy, unspecified eye: Secondary | ICD-10-CM

## 2012-04-21 DIAGNOSIS — E11359 Type 2 diabetes mellitus with proliferative diabetic retinopathy without macular edema: Secondary | ICD-10-CM

## 2012-04-21 DIAGNOSIS — H43819 Vitreous degeneration, unspecified eye: Secondary | ICD-10-CM

## 2012-04-21 DIAGNOSIS — E1165 Type 2 diabetes mellitus with hyperglycemia: Secondary | ICD-10-CM

## 2012-04-21 DIAGNOSIS — E1139 Type 2 diabetes mellitus with other diabetic ophthalmic complication: Secondary | ICD-10-CM

## 2012-04-22 NOTE — Procedures (Unsigned)
BYPASS GRAFT EVALUATION  INDICATION:  Left lower extremity bypass graft  HISTORY: Diabetes:  Yes Cardiac: Hypertension:  Yes Smoking:  No Previous Surgery:  Left popliteal to posterior tibial artery bypass graft in 05/2011  SINGLE LEVEL ARTERIAL EXAM                              RIGHT              LEFT Brachial: Anterior tibial: Posterior tibial: Peroneal: Ankle/brachial index:  PREVIOUS ABI:  Date:  RIGHT:  LEFT:  LOWER EXTREMITY BYPASS GRAFT DUPLEX EXAM:  DUPLEX:  The left lower extremity bypass graft was not adequately visualized.  Biphasic Doppler waveforms noted throughout the left femoral and popliteal arterial system.  Velocities of >200 cm/s noted in the left proximal profunda femoral, proximal superficial femoral and mid superficial femoral arteries, as described on the attached worksheet.  IMPRESSION:  With correlation with decreased left ankle brachial index, as noted no separate report, the inability to visualize a patent left popliteal to posterior tibial artery bypass graft is suggestive of possible graft occlusion.  ___________________________________________ Rosetta Posner, M.D.  CH/MEDQ  D:  03/25/2012  T:  03/25/2012  Job:  [id number]

## 2012-05-04 ENCOUNTER — Other Ambulatory Visit (INDEPENDENT_AMBULATORY_CARE_PROVIDER_SITE_OTHER): Payer: Medicare Other | Admitting: Ophthalmology

## 2012-05-04 DIAGNOSIS — H3581 Retinal edema: Secondary | ICD-10-CM

## 2012-05-09 ENCOUNTER — Ambulatory Visit: Payer: Medicare Other | Admitting: Cardiology

## 2012-05-20 ENCOUNTER — Encounter: Payer: Self-pay | Admitting: Internal Medicine

## 2012-05-20 ENCOUNTER — Ambulatory Visit (INDEPENDENT_AMBULATORY_CARE_PROVIDER_SITE_OTHER): Payer: Medicare Other | Admitting: *Deleted

## 2012-05-20 DIAGNOSIS — I5022 Chronic systolic (congestive) heart failure: Secondary | ICD-10-CM

## 2012-05-20 DIAGNOSIS — I429 Cardiomyopathy, unspecified: Secondary | ICD-10-CM

## 2012-05-20 LAB — ICD DEVICE OBSERVATION
AL AMPLITUDE: 2.125 mv
AL IMPEDENCE ICD: 323 Ohm
ATRIAL PACING ICD: 1.29 pct
CHARGE TIME: 9.589 s
LV LEAD IMPEDENCE ICD: 380 Ohm
RV LEAD AMPLITUDE: 20.875 mv
RV LEAD IMPEDENCE ICD: 437 Ohm
TOT-0001: 1
TOT-0002: 0
TZAT-0001ATACH: 1
TZAT-0001ATACH: 2
TZAT-0001FASTVT: 1
TZAT-0002ATACH: NEGATIVE
TZAT-0002ATACH: NEGATIVE
TZAT-0011SLOWVT: 10 ms
TZAT-0012FASTVT: 200 ms
TZAT-0012SLOWVT: 200 ms
TZAT-0013SLOWVT: 2
TZAT-0018ATACH: NEGATIVE
TZAT-0018ATACH: NEGATIVE
TZAT-0018ATACH: NEGATIVE
TZAT-0018SLOWVT: NEGATIVE
TZAT-0019ATACH: 6 V
TZAT-0019SLOWVT: 8 V
TZAT-0020ATACH: 1.5 ms
TZAT-0020FASTVT: 1.5 ms
TZAT-0020SLOWVT: 1.5 ms
TZON-0003SLOWVT: 320 ms
TZON-0004VSLOWVT: 36
TZON-0005SLOWVT: 12
TZST-0001FASTVT: 2
TZST-0001FASTVT: 3
TZST-0001FASTVT: 4
TZST-0001SLOWVT: 3
TZST-0001SLOWVT: 4
TZST-0001SLOWVT: 6
TZST-0002ATACH: NEGATIVE
TZST-0002ATACH: NEGATIVE
TZST-0002FASTVT: NEGATIVE
TZST-0002FASTVT: NEGATIVE
TZST-0003SLOWVT: 35 J
TZST-0003SLOWVT: 35 J

## 2012-05-20 NOTE — Progress Notes (Signed)
ICD check with ICM 

## 2012-06-02 ENCOUNTER — Other Ambulatory Visit: Payer: Self-pay | Admitting: *Deleted

## 2012-06-02 DIAGNOSIS — I10 Essential (primary) hypertension: Secondary | ICD-10-CM

## 2012-06-08 ENCOUNTER — Other Ambulatory Visit (INDEPENDENT_AMBULATORY_CARE_PROVIDER_SITE_OTHER): Payer: Medicare Other | Admitting: Ophthalmology

## 2012-06-08 DIAGNOSIS — E1139 Type 2 diabetes mellitus with other diabetic ophthalmic complication: Secondary | ICD-10-CM

## 2012-06-08 DIAGNOSIS — E11359 Type 2 diabetes mellitus with proliferative diabetic retinopathy without macular edema: Secondary | ICD-10-CM

## 2012-06-24 ENCOUNTER — Other Ambulatory Visit: Payer: Self-pay | Admitting: *Deleted

## 2012-06-24 DIAGNOSIS — I10 Essential (primary) hypertension: Secondary | ICD-10-CM

## 2012-06-27 ENCOUNTER — Other Ambulatory Visit: Payer: Self-pay | Admitting: Cardiology

## 2012-06-27 MED ORDER — HYDRALAZINE HCL 50 MG PO TABS: 50.0000 mg | ORAL_TABLET | Freq: Three times a day (TID) | ORAL | Status: DC

## 2012-06-27 MED ORDER — DIGOXIN 125 MCG PO TABS: 0.1250 mg | ORAL_TABLET | Freq: Every day | ORAL | Status: DC

## 2012-06-27 NOTE — Telephone Encounter (Signed)
Pt needs refills please/tmj

## 2012-06-29 ENCOUNTER — Other Ambulatory Visit: Payer: Self-pay | Admitting: Cardiology

## 2012-06-29 MED ORDER — HYDRALAZINE HCL 50 MG PO TABS: 50.0000 mg | ORAL_TABLET | Freq: Three times a day (TID) | ORAL | Status: DC

## 2012-06-30 LAB — BASIC METABOLIC PANEL
CO2: 22 mEq/L (ref 19–32)
Chloride: 111 mEq/L (ref 96–112)
Creat: 1.39 mg/dL — ABNORMAL HIGH (ref 0.50–1.10)
Potassium: 4.6 mEq/L (ref 3.5–5.3)
Sodium: 142 mEq/L (ref 135–145)

## 2012-07-19 ENCOUNTER — Ambulatory Visit: Payer: Medicare Other | Admitting: Cardiology

## 2012-07-20 ENCOUNTER — Ambulatory Visit (INDEPENDENT_AMBULATORY_CARE_PROVIDER_SITE_OTHER): Payer: Medicare Other | Admitting: Cardiology

## 2012-07-20 ENCOUNTER — Encounter: Payer: Self-pay | Admitting: Cardiology

## 2012-07-20 VITALS — BP 158/60 | HR 57 | Ht 61.0 in | Wt 190.0 lb

## 2012-07-20 DIAGNOSIS — I10 Essential (primary) hypertension: Secondary | ICD-10-CM

## 2012-07-20 DIAGNOSIS — I2510 Atherosclerotic heart disease of native coronary artery without angina pectoris: Secondary | ICD-10-CM

## 2012-07-20 DIAGNOSIS — I429 Cardiomyopathy, unspecified: Secondary | ICD-10-CM

## 2012-07-20 MED ORDER — FUROSEMIDE 40 MG PO TABS: 20.0000 mg | ORAL_TABLET | Freq: Every day | ORAL | Status: DC

## 2012-07-20 NOTE — Assessment & Plan Note (Signed)
Nonobstructive. No active angina.

## 2012-07-20 NOTE — Progress Notes (Signed)
Clinical Summary [name] is a 66 y.o.female presenting for followup. She was seen in July. Her weight continues to climb. She states that she has not been walking and exercising like she did in the past. Lasix remains at 20 mg daily.  Echocardiogram in July showed LVEF 30% - overall stable, mild AS, small pericardial effusion. BMET in late September showed potassium, 4.6, BUN 38, creatinine 1.3. ECG today shows ventricular pacing at 57 BPM.   Allergies  Allergen Reactions  . Hydrocodone-Acetaminophen     Current Outpatient Prescriptions  Medication Sig Dispense Refill  . amLODipine (NORVASC) 10 MG tablet Take 1 tablet (10 mg total) by mouth 2 (two) times daily.  60 tablet  6  . aspirin 81 MG tablet Take 81 mg by mouth daily.        . carvedilol (COREG) 25 MG tablet Take 1 tablet (25 mg total) by mouth 2 (two) times daily with a meal.  60 tablet  6  . Cholecalciferol (VITAMIN D3) 1000000 UNIT/GM LIQD 1,000 Units by Does not apply route every other day.       Marland Kitchen COZAAR 50 MG tablet TAKE 1 TABLET BY MOUTH TWICE DAILY.  60 each  6  . digoxin (LANOXIN) 0.125 MG tablet Take 1 tablet (0.125 mg total) by mouth daily.  30 tablet  6  . docusate sodium (COLACE) 100 MG capsule Take 100 mg by mouth as needed.        . doxycycline (DORYX) 100 MG DR capsule Take 100 mg by mouth 2 (two) times daily.        . furosemide (LASIX) 20 MG tablet Take 20 mg by mouth daily.      . hydrALAZINE (APRESOLINE) 50 MG tablet Take 1 tablet (50 mg total) by mouth 3 (three) times daily.  90 tablet  1  . insulin glargine (LANTUS) 100 UNIT/ML injection Inject 40 Units into the skin daily.        . nitroGLYCERIN (NITROSTAT) 0.4 MG SL tablet Place 0.4 mg under the tongue every 5 (five) minutes as needed.        Marland Kitchen spironolactone (ALDACTONE) 25 MG tablet Take 0.5 tablets (12.5 mg total) by mouth daily.  15 each  6  . DISCONTD: furosemide (LASIX) 20 MG tablet Take 1 tablet (20 mg total) by mouth daily.  90 tablet  1     Past Medical History  Diagnosis Date  . Diabetes mellitus, type 2   . Essential hypertension, benign   . Chronic systolic heart failure   . Coronary atherosclerosis of native coronary artery     Nonobstructive  . Nonischemic cardiomyopathy     LVEF 20%  . PAD (peripheral artery disease)     Left foot gangrene  . Anxiety   . Depression     Past Surgical History  Procedure Date  . Abdominal hysterectomy   . Left shoulder surgery   . Breast lumpectomy   . Cardiac defibrillator placement     Medtronic [id number]   . Exploratory laparotomy with lysis of adhesions   . Bladder surgery 2011 BENIGN MASS REMOVED FROM BLADDER  . Eye surgery BILATERAL CATARACTS REMOVAL  10 YEARS AGO PER PATIENT    Social History Ms. Shorr reports that she has never smoked. She has never used smokeless tobacco. Ms. Kranich reports that she does not drink alcohol.  Review of Systems No device problems, palpitations, syncope.  Physical Examination Filed Vitals:   07/20/12 1026  BP: 158/60  Pulse: 57  Filed Weights   07/20/12 1026  Weight: 190 lb (86.183 kg)    HEENT: Conjunctiva and lids normal, oropharynx clear.  Neck: Supple, no carotid bruis, no thyromegaly.  Lungs: Clear with diminished breath sounds, nonlabored.  Cardiac: Indistinct PMI, regular rate and rhythm, 2/6 systolic murmur at the base, paradoxically split S2, no S3.  Thorax: Well-healed device pocket site on the upper left.  Abdomen: Soft, nontender, no hepatomegaly, bowel sounds present.  Extremities: 1+ edema, diminished distal pulses..  Skin: Warm and dry.  Neuropsychiatric: Alert and oriented x3, affect appropriate.     Problem List and Plan   UNSPECIFIED SECONDARY CARDIOMYOPATHY Weight continues to increase, seems to be combination of fluid and general body wieight. We discussed increasing Lasix to 40 mg daily and increasing her walking, looking closely at her diet to limit carbohydrates. Followup arranged  with BMET.  CORONARY ATHEROSCLEROSIS NATIVE CORONARY ARTERY Nonobstructive. No active angina.  ESSENTIAL HYPERTENSION, BENIGN Continue current regimen for now.    Satira Sark, M.D., F.A.C.C.

## 2012-07-20 NOTE — Assessment & Plan Note (Signed)
Continue current regimen for now

## 2012-07-20 NOTE — Assessment & Plan Note (Signed)
Weight continues to increase, seems to be combination of fluid and general body wieight. We discussed increasing Lasix to 40 mg daily and increasing her walking, looking closely at her diet to limit carbohydrates. Followup arranged with BMET.

## 2012-07-20 NOTE — Patient Instructions (Signed)
Your physician recommends that you schedule a follow-up appointment in: 2 months  Your physician recommends that you return for lab work in: Due a couple of days prior to follow up visit  Your physician has recommended you make the following change in your medication:  1 - INCREASE Lasix to 40 mg daily

## 2012-07-21 ENCOUNTER — Ambulatory Visit: Payer: Medicare Other | Admitting: Cardiology

## 2012-08-10 ENCOUNTER — Encounter: Payer: Self-pay | Admitting: *Deleted

## 2012-08-16 ENCOUNTER — Ambulatory Visit (INDEPENDENT_AMBULATORY_CARE_PROVIDER_SITE_OTHER): Payer: Medicare Other | Admitting: *Deleted

## 2012-08-16 DIAGNOSIS — I429 Cardiomyopathy, unspecified: Secondary | ICD-10-CM

## 2012-08-16 DIAGNOSIS — I5022 Chronic systolic (congestive) heart failure: Secondary | ICD-10-CM

## 2012-08-16 LAB — ICD DEVICE OBSERVATION
AL AMPLITUDE: 2 mv
AL IMPEDENCE ICD: 342 Ohm
AL THRESHOLD: 0.75 v
ATRIAL PACING ICD: 1.55 pct
BAMS-0001: 170 {beats}/min
BATTERY VOLTAGE: 3.1108 v
CHARGE TIME: 9.589 s
FVT: 0
LV LEAD IMPEDENCE ICD: 380 Ohm
LV LEAD THRESHOLD: 1.375 v
PACEART VT: 0
RV LEAD AMPLITUDE: 21 mv
RV LEAD IMPEDENCE ICD: 399 Ohm
RV LEAD THRESHOLD: 0.75 v
TOT-0001: 1
TOT-0002: 0
TOT-0006: 20111228000000
TZAT-0001ATACH: 1
TZAT-0001ATACH: 2
TZAT-0001ATACH: 3
TZAT-0001FASTVT: 1
TZAT-0001SLOWVT: 1
TZAT-0002ATACH: NEGATIVE
TZAT-0002ATACH: NEGATIVE
TZAT-0002ATACH: NEGATIVE
TZAT-0002FASTVT: NEGATIVE
TZAT-0004SLOWVT: 8
TZAT-0005SLOWVT: 88 pct
TZAT-0011SLOWVT: 10 ms
TZAT-0012ATACH: 150 ms
TZAT-0012ATACH: 150 ms
TZAT-0012ATACH: 150 ms
TZAT-0012FASTVT: 200 ms
TZAT-0012SLOWVT: 200 ms
TZAT-0013SLOWVT: 2
TZAT-0018ATACH: NEGATIVE
TZAT-0018ATACH: NEGATIVE
TZAT-0018ATACH: NEGATIVE
TZAT-0018FASTVT: NEGATIVE
TZAT-0018SLOWVT: NEGATIVE
TZAT-0019ATACH: 6 v
TZAT-0019ATACH: 6 v
TZAT-0019ATACH: 6 v
TZAT-0019FASTVT: 8 v
TZAT-0019SLOWVT: 8 v
TZAT-0020ATACH: 1.5 ms
TZAT-0020ATACH: 1.5 ms
TZAT-0020ATACH: 1.5 ms
TZAT-0020FASTVT: 1.5 ms
TZAT-0020SLOWVT: 1.5 ms
TZON-0003ATACH: 350 ms
TZON-0003SLOWVT: 320 ms
TZON-0003VSLOWVT: 400 ms
TZON-0004SLOWVT: 32
TZON-0004VSLOWVT: 36
TZON-0005SLOWVT: 12
TZST-0001ATACH: 4
TZST-0001ATACH: 5
TZST-0001ATACH: 6
TZST-0001FASTVT: 2
TZST-0001FASTVT: 3
TZST-0001FASTVT: 4
TZST-0001FASTVT: 5
TZST-0001FASTVT: 6
TZST-0001SLOWVT: 2
TZST-0001SLOWVT: 3
TZST-0001SLOWVT: 4
TZST-0001SLOWVT: 5
TZST-0001SLOWVT: 6
TZST-0002ATACH: NEGATIVE
TZST-0002ATACH: NEGATIVE
TZST-0002ATACH: NEGATIVE
TZST-0002FASTVT: NEGATIVE
TZST-0002FASTVT: NEGATIVE
TZST-0002FASTVT: NEGATIVE
TZST-0002FASTVT: NEGATIVE
TZST-0002FASTVT: NEGATIVE
TZST-0003SLOWVT: 20 J
TZST-0003SLOWVT: 35 J
TZST-0003SLOWVT: 35 J
TZST-0003SLOWVT: 35 J
TZST-0003SLOWVT: 35 J
VENTRICULAR PACING ICD: 99.91 pct
VF: 0

## 2012-08-16 NOTE — Progress Notes (Signed)
ICD check with ICM 

## 2012-08-19 ENCOUNTER — Other Ambulatory Visit: Payer: Self-pay | Admitting: Cardiology

## 2012-09-05 ENCOUNTER — Encounter: Payer: Self-pay | Admitting: Internal Medicine

## 2012-09-06 ENCOUNTER — Other Ambulatory Visit: Payer: Self-pay | Admitting: Cardiology

## 2012-09-07 LAB — BASIC METABOLIC PANEL
BUN: 26 mg/dL — ABNORMAL HIGH (ref 6–23)
Creat: 1.35 mg/dL — ABNORMAL HIGH (ref 0.50–1.10)

## 2012-09-12 ENCOUNTER — Encounter: Payer: Self-pay | Admitting: *Deleted

## 2012-09-13 ENCOUNTER — Other Ambulatory Visit: Payer: Self-pay | Admitting: *Deleted

## 2012-09-13 ENCOUNTER — Ambulatory Visit (INDEPENDENT_AMBULATORY_CARE_PROVIDER_SITE_OTHER): Payer: Medicare Other | Admitting: Cardiology

## 2012-09-13 ENCOUNTER — Encounter: Payer: Self-pay | Admitting: *Deleted

## 2012-09-13 ENCOUNTER — Encounter: Payer: Self-pay | Admitting: Cardiology

## 2012-09-13 VITALS — BP 170/70 | HR 55 | Ht 61.0 in | Wt 178.0 lb

## 2012-09-13 DIAGNOSIS — I739 Peripheral vascular disease, unspecified: Secondary | ICD-10-CM

## 2012-09-13 DIAGNOSIS — I5022 Chronic systolic (congestive) heart failure: Secondary | ICD-10-CM

## 2012-09-13 DIAGNOSIS — I10 Essential (primary) hypertension: Secondary | ICD-10-CM

## 2012-09-13 DIAGNOSIS — Z48812 Encounter for surgical aftercare following surgery on the circulatory system: Secondary | ICD-10-CM

## 2012-09-13 DIAGNOSIS — Z95810 Presence of automatic (implantable) cardiac defibrillator: Secondary | ICD-10-CM

## 2012-09-13 NOTE — Patient Instructions (Signed)
Your physician recommends that you schedule a follow-up appointment in: [place] physician has recommended you make the following change in your medication:  Take Carvedilol 12 hours apart  Your physician has requested that you regularly monitor and record your blood pressure readings at home. Please use the same machine at the same time of day to check your readings and record them to bring to your follow-up visit.  Your physician recommends that you return for lab work in: 3 months

## 2012-09-13 NOTE — Progress Notes (Signed)
Clinical Summary [name] is a 66 y.o.female presenting for followup. She was seen in October. Lasix was increased to 40 mg daily at that time. Followup BMET done recently showed sodium 138, potassium 4.2, BUN 26, creatinine 1.3 which was stable. Her weight is down 12 pounds from the last visit.  She had an interval device check in November showing biventricular pacing greater than 99.9% of the time, had no mode switch episodes, no ventricular arrhythmias noted. Did have an abnormal Optivol 10/7-10/30.  Symptomatically, she has been stable, reports NYHA class II dyspnea, no progressive edema. She states she has been more careful with watching sodium and excess fluid in her diet. We discussed her blood pressure which remains elevated. Discussed the spacing of her medications, also keeping a log for [place] to review.   Allergies  Allergen Reactions  . Hydrocodone-Acetaminophen     Current Outpatient Prescriptions  Medication Sig Dispense Refill  . amLODipine (NORVASC) 10 MG tablet Take 1 tablet (10 mg total) by mouth 2 (two) times daily.  60 tablet  6  . aspirin 81 MG tablet Take 81 mg by mouth daily.        . carvedilol (COREG) 25 MG tablet Take 1 tablet (25 mg total) by mouth 2 (two) times daily with a meal.  60 tablet  6  . Cholecalciferol (VITAMIN D3) 1000000 UNIT/GM LIQD 1,000 Units by Does not apply route every other day.       Marland Kitchen COZAAR 50 MG tablet TAKE 1 TABLET BY MOUTH TWICE DAILY.  60 tablet  2  . digoxin (LANOXIN) 0.125 MG tablet Take 1 tablet (0.125 mg total) by mouth daily.  30 tablet  6  . docusate sodium (COLACE) 100 MG capsule Take 100 mg by mouth as needed.        . furosemide (LASIX) 40 MG tablet Take 40 mg by mouth daily.      . hydrALAZINE (APRESOLINE) 50 MG tablet Take 1 tablet (50 mg total) by mouth 3 (three) times daily.  90 tablet  1  . insulin glargine (LANTUS) 100 UNIT/ML injection Inject 40 Units into the skin daily.        . nitroGLYCERIN (NITROSTAT) 0.4 MG SL  tablet Place 0.4 mg under the tongue every 5 (five) minutes as needed.        Marland Kitchen spironolactone (ALDACTONE) 25 MG tablet Take 0.5 tablets (12.5 mg total) by mouth daily.  15 each  6  . [DISCONTINUED] furosemide (LASIX) 40 MG tablet Take 0.5 tablets (20 mg total) by mouth daily.  30 tablet  11    Past Medical History  Diagnosis Date  . Diabetes mellitus, type 2   . Essential hypertension, benign   . Chronic systolic heart failure   . Coronary atherosclerosis of native coronary artery     Nonobstructive  . Nonischemic cardiomyopathy     LVEF 20%  . PAD (peripheral artery disease)     Left foot gangrene  . Anxiety   . Depression     Past Surgical History  Procedure Date  . Abdominal hysterectomy   . Left shoulder surgery   . Breast lumpectomy   . Cardiac defibrillator placement     Medtronic [id number]   . Exploratory laparotomy with lysis of adhesions   . Bladder surgery 2011 BENIGN MASS REMOVED FROM BLADDER  . Eye surgery BILATERAL CATARACTS REMOVAL  10 YEARS AGO PER PATIENT    Social History [name] reports that she has never smoked. She has  never used smokeless tobacco. [name] reports that she does not drink alcohol.  Review of Systems No palpitations or device discharges. No syncope. No fevers or chills.  Physical Examination Filed Vitals:   09/13/12 0830  BP: 170/70  Pulse: 55   Filed Weights   09/13/12 0830  Weight: 178 lb (80.74 kg)   Overweight woman in no acute distress. HEENT: Conjunctiva and lids normal, oropharynx clear.  Neck: Supple, no carotid bruis, no thyromegaly.  Lungs: Clear with diminished breath sounds, nonlabored.  Cardiac: Indistinct PMI, regular rate and rhythm, 2/6 systolic murmur at the base, paradoxically split S2, no S3.  Thorax: Well-healed device pocket site on the upper left.  Extremities: 1+ edema, diminished distal pulses..  Skin: Warm and dry.    Problem List and Plan   Chronic systolic heart  failure Symptomatically stable, weight down 12 pounds since October following increase in Lasix. Again we discussed diet, sodium and fluid restriction. Follow BMET shows stable renal insufficiency with creatinine 1.3. Repeat BMET for next visit in 3 months.  ESSENTIAL HYPERTENSION, BENIGN We discussed appropriate spacing of her medications during the day. I asked her to keep a blood pressure log for the next few weeks so we can review her control at home. Suspect hydralazine will also need to be increased.  ICD-Medtronic Biventricular ICD, followed by Dr. Caryl Comes.    Satira Sark, M.D., F.A.C.C.

## 2012-09-13 NOTE — Assessment & Plan Note (Signed)
Symptomatically stable, weight down 12 pounds since October following increase in Lasix. Again we discussed diet, sodium and fluid restriction. Follow BMET shows stable renal insufficiency with creatinine 1.3. Repeat BMET for next visit in 3 months.

## 2012-09-13 NOTE — Assessment & Plan Note (Signed)
We discussed appropriate spacing of her medications during the day. I asked her to keep a blood pressure log for the next few weeks so we can review her control at home. Suspect hydralazine will also need to be increased.

## 2012-09-13 NOTE — Assessment & Plan Note (Signed)
Biventricular ICD, followed by Dr. Caryl Comes.

## 2012-09-19 ENCOUNTER — Encounter: Payer: Self-pay | Admitting: Neurosurgery

## 2012-09-20 ENCOUNTER — Encounter (INDEPENDENT_AMBULATORY_CARE_PROVIDER_SITE_OTHER): Payer: Medicare Other | Admitting: *Deleted

## 2012-09-20 ENCOUNTER — Encounter: Payer: Self-pay | Admitting: Neurosurgery

## 2012-09-20 ENCOUNTER — Ambulatory Visit (INDEPENDENT_AMBULATORY_CARE_PROVIDER_SITE_OTHER): Payer: Medicare Other | Admitting: Neurosurgery

## 2012-09-20 VITALS — BP 148/73 | HR 58 | Resp 16 | Ht 61.0 in | Wt 176.0 lb

## 2012-09-20 DIAGNOSIS — I739 Peripheral vascular disease, unspecified: Secondary | ICD-10-CM

## 2012-09-20 DIAGNOSIS — Z48812 Encounter for surgical aftercare following surgery on the circulatory system: Secondary | ICD-10-CM

## 2012-09-20 NOTE — Progress Notes (Signed)
[facility] PAD/PVD Office Note  CC: PVD surveillance Referring Physician: Early  History of Present Illness: 66 year old female patient of Dr. Donnetta Hutching status post a left popliteal to posterior tibial artery graft in August 2012. The patient denies claudication or rest pain. The patient has no open ulcerations on her lower extremities and is able to carry out her ADLs without difficulty.  Past Medical History  Diagnosis Date  . Diabetes mellitus, type 2   . Essential hypertension, benign   . Chronic systolic heart failure   . Coronary atherosclerosis of native coronary artery     Nonobstructive  . Nonischemic cardiomyopathy     LVEF 20%  . PAD (peripheral artery disease)     Left foot gangrene  . Anxiety   . Depression     ROS: [x]  Positive   [ ]  Denies    General: [ ]  Weight loss, [ ]  Fever, [ ]  chills Neurologic: [ ]  Dizziness, [ ]  Blackouts, [ ]  Seizure [ ]  Stroke, [ ]  "Mini stroke", [ ]  Slurred speech, [ ]  Temporary blindness; [ ]  weakness in arms or legs, [ ]  Hoarseness Cardiac: [ ]  Chest pain/pressure, [ ]  Shortness of breath at rest [ ]  Shortness of breath with exertion, [ ]  Atrial fibrillation or irregular heartbeat Vascular: [ ]  Pain in legs with walking, [ ]  Pain in legs at rest, [ ]  Pain in legs at night,  [ ]  Non-healing ulcer, [ ]  Blood clot in vein/DVT,   Pulmonary: [ ]  Home oxygen, [ ]  Productive cough, [ ]  Coughing up blood, [ ]  Asthma,  [ ]  Wheezing Musculoskeletal:  [ ]  Arthritis, [ ]  Low back pain, [ ]  Joint pain Hematologic: [ ]  Easy Bruising, [ ]  Anemia; [ ]  Hepatitis Gastrointestinal: [ ]  Blood in stool, [ ]  Gastroesophageal Reflux/heartburn, [ ]  Trouble swallowing Urinary: [ ]  chronic Kidney disease, [ ]  on HD - [ ]  MWF or [ ]  TTHS, [ ]  Burning with urination, [ ]  Difficulty urinating Skin: [ ]  Rashes, [ ]  Wounds Psychological: [ ]  Anxiety, [ ]  Depression   Social History History  Substance Use Topics  . Smoking status:  Never Smoker   . Smokeless tobacco: Never Used     Comment: tobacco use-no  . Alcohol Use: No    Family History Family History  Problem Relation Age of Onset  . Cirrhosis Father   . Cancer Mother     Ovarian  . Ovarian cancer Mother     Allergies  Allergen Reactions  . Hydrocodone-Acetaminophen     Current Outpatient Prescriptions  Medication Sig Dispense Refill  . amLODipine (NORVASC) 10 MG tablet Take 1 tablet (10 mg total) by mouth 2 (two) times daily.  60 tablet  6  . aspirin 81 MG tablet Take 81 mg by mouth daily.        . carvedilol (COREG) 25 MG tablet Take 1 tablet (25 mg total) by mouth 2 (two) times daily with a meal.  60 tablet  6  . Cholecalciferol (VITAMIN D3) 1000000 UNIT/GM LIQD 1,000 Units by Does not apply route every other day.       Marland Kitchen COZAAR 50 MG tablet TAKE 1 TABLET BY MOUTH TWICE DAILY.  60 tablet  2  . digoxin (LANOXIN) 0.125 MG tablet Take 1 tablet (0.125 mg total) by mouth daily.  30 tablet  6  . docusate sodium (COLACE) 100 MG capsule Take 100 mg by mouth as needed.        Marland Kitchen  furosemide (LASIX) 40 MG tablet Take 40 mg by mouth daily.      . hydrALAZINE (APRESOLINE) 50 MG tablet Take 1 tablet (50 mg total) by mouth 3 (three) times daily.  90 tablet  1  . insulin glargine (LANTUS) 100 UNIT/ML injection Inject 40 Units into the skin daily.        . nitroGLYCERIN (NITROSTAT) 0.4 MG SL tablet Place 0.4 mg under the tongue every 5 (five) minutes as needed.        Marland Kitchen spironolactone (ALDACTONE) 25 MG tablet Take 0.5 tablets (12.5 mg total) by mouth daily.  15 each  6    Physical Examination  Filed Vitals:   09/20/12 1132  BP: 148/73  Pulse: 58  Resp: 16    Body mass index is 33.25 kg/(m^2).  General:  WDWN in NAD Gait: Normal HEENT: WNL Eyes: Pupils equal Pulmonary: normal non-labored breathing , without Rales, rhonchi,  wheezing Cardiac: RRR, without  Murmurs, rubs or gallops; No carotid bruits Abdomen: soft, NT, no masses Skin: no rashes,  ulcers noted Vascular Exam/Pulses: Palpable femoral pulses bilaterally, PT and DP pulses are not palpable due to the edema. Her feet are well-perfused and there are no open ulcerations or no temperature change.  Extremities without ischemic changes, no Gangrene , no cellulitis; no open wounds;  Musculoskeletal: no muscle wasting or atrophy  Neurologic: A&O X 3; Appropriate Affect ; SENSATION: normal; MOTOR FUNCTION:  moving all extremities equally. Speech is fluent/normal  Non-Invasive Vascular Imaging: ABIs today are 0.65 and monophasic on the right, 0.48 monophasic on the left, she is slightly improved on the right and stable on the left compared to previous exam.  ASSESSMENT/PLAN: Asymptomatic patient with a known distal graft occlusion. This was reviewed with Dr. Donnetta Hutching who recommends continued surveillance. The patient will followup in 6 months with repeat graft duplex and ABIs. The patient's questions were encouraged and answered, she is in agreement with this plan.  Beatris Ship ANP   Clinic M.D.: Early

## 2012-09-21 ENCOUNTER — Ambulatory Visit: Payer: Medicare Other | Admitting: Neurosurgery

## 2012-09-21 NOTE — Addendum Note (Signed)
Addended by: Mena Goes on: 09/21/2012 09:30 AM   Modules accepted: Orders

## 2012-09-29 ENCOUNTER — Other Ambulatory Visit: Payer: Self-pay | Admitting: *Deleted

## 2012-09-29 ENCOUNTER — Other Ambulatory Visit: Payer: Self-pay | Admitting: Cardiology

## 2012-09-29 MED ORDER — HYDRALAZINE HCL 50 MG PO TABS: 50.0000 mg | ORAL_TABLET | Freq: Three times a day (TID) | ORAL | Status: DC

## 2012-09-29 NOTE — Telephone Encounter (Signed)
rx sent to pharmacy by e-script  

## 2012-10-07 ENCOUNTER — Encounter: Payer: Self-pay | Admitting: Cardiology

## 2012-10-07 ENCOUNTER — Telehealth: Payer: Self-pay | Admitting: Cardiology

## 2012-10-07 NOTE — Telephone Encounter (Signed)
Patient turned in BP reading a week ago.  Stated that she has not received any instructions from that whether she needs to change her meds. / tgs

## 2012-10-07 NOTE — Telephone Encounter (Signed)
Returned pt call and was advised that MD SM noted if her BP was still high that he may want to increase her hydralazine per continued elevated BP, scanned BP records into chart, please advise

## 2012-10-09 NOTE — Telephone Encounter (Signed)
Reviewed - increase hydralazine to 75 mg TID.

## 2012-10-10 ENCOUNTER — Ambulatory Visit (INDEPENDENT_AMBULATORY_CARE_PROVIDER_SITE_OTHER): Payer: Medicare Other | Admitting: Ophthalmology

## 2012-10-10 DIAGNOSIS — E1139 Type 2 diabetes mellitus with other diabetic ophthalmic complication: Secondary | ICD-10-CM

## 2012-10-10 DIAGNOSIS — E11359 Type 2 diabetes mellitus with proliferative diabetic retinopathy without macular edema: Secondary | ICD-10-CM

## 2012-10-10 DIAGNOSIS — H43819 Vitreous degeneration, unspecified eye: Secondary | ICD-10-CM

## 2012-10-10 DIAGNOSIS — E1165 Type 2 diabetes mellitus with hyperglycemia: Secondary | ICD-10-CM

## 2012-10-10 DIAGNOSIS — H4310 Vitreous hemorrhage, unspecified eye: Secondary | ICD-10-CM

## 2012-10-10 DIAGNOSIS — H35349 Macular cyst, hole, or pseudohole, unspecified eye: Secondary | ICD-10-CM

## 2012-10-10 DIAGNOSIS — H35379 Puckering of macula, unspecified eye: Secondary | ICD-10-CM

## 2012-10-10 MED ORDER — HYDRALAZINE HCL 50 MG PO TABS: 50.0000 mg | ORAL_TABLET | Freq: Three times a day (TID) | ORAL | Status: DC

## 2012-10-10 NOTE — Telephone Encounter (Signed)
Spoke with pt to advise increase in medication, pt understood and agreed to take one and a half tabs three times daily per the medication only comes in doses of 50mg  or 25mg  and pt would rather split pills than take (one 50mg  and one 25mg ) sent new prescription to pharmacy for pt to pick up, pt understood all instructions and will continue to monitor BP's

## 2012-10-10 NOTE — Telephone Encounter (Signed)
.  left message to have patient return my call.  

## 2012-10-12 ENCOUNTER — Telehealth: Payer: Self-pay | Admitting: *Deleted

## 2012-10-12 DIAGNOSIS — H35349 Macular cyst, hole, or pseudohole, unspecified eye: Secondary | ICD-10-CM | POA: Diagnosis present

## 2012-10-12 DIAGNOSIS — E113599 Type 2 diabetes mellitus with proliferative diabetic retinopathy without macular edema, unspecified eye: Secondary | ICD-10-CM

## 2012-10-12 NOTE — Telephone Encounter (Signed)
Noted incoming fax from [facility] to request surgical clear ence for eye surgery, placed documentation to be signed by MD SM, will fax upon receipt, noted pt not on Coumadin at this time

## 2012-10-12 NOTE — H&P (Signed)
[name] is an 67 y.o. female.   Chief Complaint:Loss of vision right eye HPI: Severe diabetic retinopathy with large macular hole stage 4 right eye  Past Medical History  Diagnosis Date  . Diabetes mellitus, type 2   . Essential hypertension, benign   . Chronic systolic heart failure   . Coronary atherosclerosis of native coronary artery     Nonobstructive  . Nonischemic cardiomyopathy     LVEF 20%  . PAD (peripheral artery disease)     Left foot gangrene  . Anxiety   . Depression     Past Surgical History  Procedure Date  . Abdominal hysterectomy   . Left shoulder surgery   . Breast lumpectomy   . Cardiac defibrillator placement     [employer]tronic [id number]   . Exploratory laparotomy with lysis of adhesions   . Bladder surgery 2011 BENIGN MASS REMOVED FROM BLADDER  . Eye surgery BILATERAL CATARACTS REMOVAL  10 YEARS AGO PER PATIENT  . Pr vein bypass graft,aorto-fem-pop 05-2011    Left popliteal- PTA graft     Family History  Problem Relation Age of Onset  . Cirrhosis Father   . Cancer Mother     Ovarian  . Ovarian cancer Mother    Social History:  reports that she has never smoked. She has never used smokeless tobacco. She reports that she does not drink alcohol or use illicit drugs.  Allergies:  Allergies  Allergen Reactions  . Hydrocodone-Acetaminophen     No prescriptions prior to admission    Review of systems otherwise negative  There were no vitals taken for this visit.  Physical exam: Mental status: oriented x3. Eyes: See eye exam associated with this date of surgery in media tab.  Scanned in by scanning center Ears, Nose, Throat: within normal limits Neck: Within Normal limits General: within normal limits Chest: Within normal limits Breast: deferred Heart: Within normal limits Abdomen: Within normal limits GU: deferred Extremities: within normal limits Skin: within normal limits  Assessment/Plan Severe diabetic retinopathy with large  macular hole stage 4 right eye Plan: To [facility] for Pars plana vitrectomy, membrane peel, serum patch, laser therapy, gas injection right eye  MATTHEWS, Chrystie Nose 10/12/2012, 7:28 AM

## 2012-10-18 NOTE — Telephone Encounter (Signed)
Manually faxed signed/completed release form from MD SM, pt last OV notation, EKG and Echo to number provided [phone], will scan form into chart

## 2012-10-27 ENCOUNTER — Encounter (HOSPITAL_COMMUNITY): Payer: Self-pay | Admitting: Pharmacy Technician

## 2012-11-01 ENCOUNTER — Telehealth: Payer: Self-pay | Admitting: Cardiology

## 2012-11-01 NOTE — Telephone Encounter (Signed)
Patient would like to speak with nurse regarding surgical clearance. / tgs

## 2012-11-01 NOTE — Telephone Encounter (Signed)
Pt notes she is having surgery on her eye feb 6th and that she needs clearance per PCP per will have to sleep face down and look only in the direction face down and will need a muscle relaxer and will also have to be put to sleep as well. Noted the surgical clearance letter signed from [name] has been signed and faxed to [facility] with copies of [place] notation, EKG and Echo, however pt is noting that MD Mcginnis is informing pt that she needs to have clearence from her Cardiologist, I advised the pt that all this information was faxed to surgeon on 10-18-12 and that I will contact Md Mcinnis nurse to advise that I will also manually fax the clearence form to their office as well, pt understood. Contacted office and actually spoke to Md Mcinnis personally and advised the clearnence form was signed and faxed to surgeon on 10-18-12, MD Mcinnis requested a copy of the form to his office for their records and noted she had a complete physical by him recently but he wanted to make sure that her cardilogist cleared her instead of him per the ICD, faxed manually and alerted pt that Mcinnis has been informed personally, pt understood

## 2012-11-04 ENCOUNTER — Other Ambulatory Visit: Payer: Self-pay | Admitting: Adult Health

## 2012-11-08 ENCOUNTER — Ambulatory Visit: Payer: Medicare Other | Admitting: Cardiology

## 2012-11-09 ENCOUNTER — Encounter (HOSPITAL_COMMUNITY): Payer: Self-pay | Admitting: *Deleted

## 2012-11-09 MED ORDER — PHENYLEPHRINE HCL 2.5 % OP SOLN: 1.0000 [drp] | OPHTHALMIC | Status: DC | PRN

## 2012-11-09 MED ORDER — GATIFLOXACIN 0.5 % OP SOLN: 1.0000 [drp] | OPHTHALMIC | Status: DC | PRN

## 2012-11-09 MED ORDER — CEFAZOLIN SODIUM-DEXTROSE 2-3 GM-% IV SOLR: 2.0000 g | INTRAVENOUS | Status: DC

## 2012-11-09 MED ORDER — TROPICAMIDE 1 % OP SOLN: 1.0000 [drp] | OPHTHALMIC | Status: DC | PRN

## 2012-11-09 MED ORDER — CYCLOPENTOLATE HCL 1 % OP SOLN: 1.0000 [drp] | OPHTHALMIC | Status: DC | PRN

## 2012-11-10 ENCOUNTER — Encounter (HOSPITAL_COMMUNITY)
Admission: RE | Disposition: A | Discharge status: Home / Self Care | Payer: Self-pay | Source: Ambulatory Visit | Attending: Ophthalmology

## 2012-11-10 ENCOUNTER — Encounter (INDEPENDENT_AMBULATORY_CARE_PROVIDER_SITE_OTHER): Payer: Medicare Other | Admitting: Ophthalmology

## 2012-11-10 ENCOUNTER — Ambulatory Visit (HOSPITAL_COMMUNITY): Payer: Medicare Other | Admitting: Anesthesiology

## 2012-11-10 ENCOUNTER — Encounter (HOSPITAL_COMMUNITY): Payer: Self-pay | Admitting: *Deleted

## 2012-11-10 ENCOUNTER — Encounter (HOSPITAL_COMMUNITY): Payer: Self-pay | Admitting: Anesthesiology

## 2012-11-10 ENCOUNTER — Ambulatory Visit (HOSPITAL_COMMUNITY)
Admission: RE | Admit: 2012-11-10 | Discharge: 2012-11-11 | Disposition: A | Discharge status: Home / Self Care | Payer: Medicare Other | Source: Ambulatory Visit | Attending: Ophthalmology | Admitting: Ophthalmology

## 2012-11-10 ENCOUNTER — Encounter (HOSPITAL_COMMUNITY): Payer: Self-pay | Admitting: General Practice

## 2012-11-10 ENCOUNTER — Ambulatory Visit (HOSPITAL_COMMUNITY): Payer: Medicare Other

## 2012-11-10 DIAGNOSIS — E119 Type 2 diabetes mellitus without complications: Secondary | ICD-10-CM | POA: Insufficient documentation

## 2012-11-10 DIAGNOSIS — I10 Essential (primary) hypertension: Secondary | ICD-10-CM

## 2012-11-10 DIAGNOSIS — Z794 Long term (current) use of insulin: Secondary | ICD-10-CM | POA: Insufficient documentation

## 2012-11-10 DIAGNOSIS — H35349 Macular cyst, hole, or pseudohole, unspecified eye: Secondary | ICD-10-CM

## 2012-11-10 DIAGNOSIS — E11359 Type 2 diabetes mellitus with proliferative diabetic retinopathy without macular edema: Secondary | ICD-10-CM

## 2012-11-10 DIAGNOSIS — I5022 Chronic systolic (congestive) heart failure: Secondary | ICD-10-CM | POA: Insufficient documentation

## 2012-11-10 DIAGNOSIS — I509 Heart failure, unspecified: Secondary | ICD-10-CM | POA: Insufficient documentation

## 2012-11-10 DIAGNOSIS — H35039 Hypertensive retinopathy, unspecified eye: Secondary | ICD-10-CM

## 2012-11-10 DIAGNOSIS — E1139 Type 2 diabetes mellitus with other diabetic ophthalmic complication: Secondary | ICD-10-CM

## 2012-11-10 DIAGNOSIS — I428 Other cardiomyopathies: Secondary | ICD-10-CM | POA: Insufficient documentation

## 2012-11-10 DIAGNOSIS — H43819 Vitreous degeneration, unspecified eye: Secondary | ICD-10-CM

## 2012-11-10 DIAGNOSIS — E113599 Type 2 diabetes mellitus with proliferative diabetic retinopathy without macular edema, unspecified eye: Secondary | ICD-10-CM

## 2012-11-10 HISTORY — PX: OTHER SURGICAL HISTORY: SHX169

## 2012-11-10 HISTORY — PX: GAS INSERTION: SHX5336

## 2012-11-10 HISTORY — PX: 25 GAUGE PARS PLANA VITRECTOMY WITH 20 GAUGE MVR PORT FOR MACULAR HOLE: SHX6096

## 2012-11-10 HISTORY — PX: SERUM PATCH: SHX6091

## 2012-11-10 LAB — CBC
MCH: 28.8 pg (ref 26.0–34.0)
MCHC: 34.7 g/dL (ref 30.0–36.0)
MCV: 83 fL (ref 78.0–100.0)
Platelets: 189 10*3/uL (ref 150–400)

## 2012-11-10 LAB — GLUCOSE, CAPILLARY
Glucose-Capillary: 144 mg/dL — ABNORMAL HIGH (ref 70–99)
Glucose-Capillary: 157 mg/dL — ABNORMAL HIGH (ref 70–99)
Glucose-Capillary: 260 mg/dL — ABNORMAL HIGH (ref 70–99)

## 2012-11-10 LAB — BASIC METABOLIC PANEL
BUN: 44 mg/dL — ABNORMAL HIGH (ref 6–23)
CO2: 21 mEq/L (ref 19–32)
Calcium: 9.8 mg/dL (ref 8.4–10.5)
Creatinine, Ser: 1.31 mg/dL — ABNORMAL HIGH (ref 0.50–1.10)
GFR calc non Af Amer: 41 mL/min — ABNORMAL LOW (ref >90)
Glucose, Bld: 165 mg/dL — ABNORMAL HIGH (ref 70–99)
Sodium: 137 mEq/L (ref 135–145)

## 2012-11-10 SURGERY — 25 GAUGE PARS PLANA VITRECTOMY WITH 20 GAUGE MVR PORT FOR MACULAR HOLE
Anesthesia: General | Site: Eye | Laterality: Right | Wound class: Clean

## 2012-11-10 MED ORDER — CARVEDILOL 3.125 MG PO TABS
3.1250 mg | ORAL_TABLET | Freq: Two times a day (BID) | ORAL | Status: DC
  Administered 2012-11-10 – 2012-11-11 (×2): 3.125 mg via ORAL
  Filled 2012-11-10 (×4): qty 1

## 2012-11-10 MED ORDER — SODIUM HYALURONATE 10 MG/ML IO SOLN
INTRAOCULAR | Status: AC
  Filled 2012-11-10: qty 0.85

## 2012-11-10 MED ORDER — ACETAZOLAMIDE SODIUM 500 MG IJ SOLR
INTRAMUSCULAR | Status: AC
  Filled 2012-11-10: qty 500

## 2012-11-10 MED ORDER — ATROPINE SULFATE 1 % OP SOLN
OPHTHALMIC | Status: AC
  Filled 2012-11-10: qty 2

## 2012-11-10 MED ORDER — INSULIN ASPART 100 UNIT/ML ~~LOC~~ SOLN
0.0000 [IU] | SUBCUTANEOUS | Status: DC
Start: 2012-11-10 — End: 2012-11-11
  Administered 2012-11-10: 3 [IU] via SUBCUTANEOUS
  Administered 2012-11-10: 8 [IU] via SUBCUTANEOUS
  Administered 2012-11-11 (×2): 5 [IU] via SUBCUTANEOUS
  Administered 2012-11-11: 8 [IU] via SUBCUTANEOUS

## 2012-11-10 MED ORDER — POLYMYXIN B SULFATE 500000 UNITS IJ SOLR
INTRAMUSCULAR | Status: AC
  Filled 2012-11-10: qty 1

## 2012-11-10 MED ORDER — BSS IO SOLN
INTRAOCULAR | Status: AC
  Filled 2012-11-10: qty 15

## 2012-11-10 MED ORDER — GENTAMICIN SULFATE 40 MG/ML IJ SOLN
INTRAMUSCULAR | Status: AC
  Filled 2012-11-10: qty 2

## 2012-11-10 MED ORDER — LIDOCAINE HCL 2 % IJ SOLN
INTRAMUSCULAR | Status: AC
  Filled 2012-11-10: qty 20

## 2012-11-10 MED ORDER — HYDRALAZINE HCL 50 MG PO TABS
75.0000 mg | ORAL_TABLET | Freq: Three times a day (TID) | ORAL | Status: DC
  Administered 2012-11-10: 75 mg via ORAL
  Filled 2012-11-10 (×5): qty 1

## 2012-11-10 MED ORDER — SODIUM CHLORIDE 0.45 % IV SOLN: INTRAVENOUS | Status: DC

## 2012-11-10 MED ORDER — EPHEDRINE SULFATE 50 MG/ML IJ SOLN
INTRAMUSCULAR | Status: DC | PRN
  Administered 2012-11-10 (×3): 10 mg via INTRAVENOUS

## 2012-11-10 MED ORDER — BUPIVACAINE HCL (PF) 0.75 % IJ SOLN
INTRAMUSCULAR | Status: DC | PRN
  Administered 2012-11-10: 10 mL

## 2012-11-10 MED ORDER — PROPOFOL 10 MG/ML IV BOLUS
INTRAVENOUS | Status: DC | PRN
  Administered 2012-11-10: 100 mg via INTRAVENOUS

## 2012-11-10 MED ORDER — GATIFLOXACIN 0.5 % OP SOLN
1.0000 [drp] | Freq: Four times a day (QID) | OPHTHALMIC | Status: DC
  Filled 2012-11-10: qty 2.5

## 2012-11-10 MED ORDER — DEXAMETHASONE SODIUM PHOSPHATE 10 MG/ML IJ SOLN
INTRAMUSCULAR | Status: DC | PRN
  Administered 2012-11-10: 10 mg

## 2012-11-10 MED ORDER — LOSARTAN POTASSIUM 50 MG PO TABS
50.0000 mg | ORAL_TABLET | Freq: Every day | ORAL | Status: DC
  Filled 2012-11-10 (×2): qty 1

## 2012-11-10 MED ORDER — GATIFLOXACIN 0.5 % OP SOLN
1.0000 [drp] | OPHTHALMIC | Status: AC | PRN
  Administered 2012-11-10: 1 [drp] via OPHTHALMIC
  Filled 2012-11-10: qty 2.5

## 2012-11-10 MED ORDER — BRIMONIDINE TARTRATE 0.2 % OP SOLN
1.0000 [drp] | Freq: Two times a day (BID) | OPHTHALMIC | Status: DC
  Filled 2012-11-10: qty 5

## 2012-11-10 MED ORDER — AMLODIPINE BESYLATE 2.5 MG PO TABS
2.5000 mg | ORAL_TABLET | Freq: Every day | ORAL | Status: DC
  Filled 2012-11-10 (×2): qty 1

## 2012-11-10 MED ORDER — BSS PLUS IO SOLN
INTRAOCULAR | Status: AC
  Filled 2012-11-10: qty 500

## 2012-11-10 MED ORDER — ONDANSETRON HCL 4 MG/2ML IJ SOLN: 4.0000 mg | Freq: Four times a day (QID) | INTRAMUSCULAR | Status: DC | PRN

## 2012-11-10 MED ORDER — FUROSEMIDE 40 MG PO TABS
40.0000 mg | ORAL_TABLET | Freq: Every day | ORAL | Status: DC
Start: 2012-11-10 — End: 2012-11-11
  Filled 2012-11-10 (×2): qty 1

## 2012-11-10 MED ORDER — DEXAMETHASONE SODIUM PHOSPHATE 10 MG/ML IJ SOLN
INTRAMUSCULAR | Status: AC
  Filled 2012-11-10: qty 1

## 2012-11-10 MED ORDER — PREDNISOLONE ACETATE 1 % OP SUSP
1.0000 [drp] | Freq: Four times a day (QID) | OPHTHALMIC | Status: DC
  Filled 2012-11-10: qty 1
  Filled 2012-11-10: qty 5

## 2012-11-10 MED ORDER — MINERAL OIL LIGHT 100 % EX OIL
TOPICAL_OIL | CUTANEOUS | Status: AC
  Filled 2012-11-10: qty 25

## 2012-11-10 MED ORDER — BUPIVACAINE HCL (PF) 0.75 % IJ SOLN
INTRAMUSCULAR | Status: AC
  Filled 2012-11-10: qty 10

## 2012-11-10 MED ORDER — DOCUSATE SODIUM 100 MG PO CAPS
100.0000 mg | ORAL_CAPSULE | Freq: Two times a day (BID) | ORAL | Status: DC
Start: 2012-11-10 — End: 2012-11-11
  Administered 2012-11-10: 100 mg via ORAL
  Filled 2012-11-10: qty 1

## 2012-11-10 MED ORDER — LIDOCAINE HCL (CARDIAC) 20 MG/ML IV SOLN
INTRAVENOUS | Status: DC | PRN
  Administered 2012-11-10: 100 mg via INTRAVENOUS

## 2012-11-10 MED ORDER — TETRACAINE HCL 0.5 % OP SOLN
2.0000 [drp] | Freq: Once | OPHTHALMIC | Status: DC
  Filled 2012-11-10: qty 2

## 2012-11-10 MED ORDER — NITROGLYCERIN 0.4 MG SL SUBL: 0.4000 mg | SUBLINGUAL_TABLET | SUBLINGUAL | Status: DC | PRN

## 2012-11-10 MED ORDER — SODIUM CHLORIDE 0.9 % IJ SOLN
INTRAMUSCULAR | Status: DC | PRN
  Administered 2012-11-10: 12:00:00

## 2012-11-10 MED ORDER — EPINEPHRINE HCL 1 MG/ML IJ SOLN
INTRAMUSCULAR | Status: AC
  Filled 2012-11-10: qty 1

## 2012-11-10 MED ORDER — INSULIN GLARGINE 100 UNIT/ML ~~LOC~~ SOLN: 40.0000 [IU] | Freq: Every day | SUBCUTANEOUS | Status: DC

## 2012-11-10 MED ORDER — ACETAZOLAMIDE SODIUM 500 MG IJ SOLR
500.0000 mg | Freq: Once | INTRAMUSCULAR | Status: AC
  Administered 2012-11-11: 500 mg via INTRAVENOUS
  Filled 2012-11-10: qty 500

## 2012-11-10 MED ORDER — BSS IO SOLN
INTRAOCULAR | Status: DC | PRN
  Administered 2012-11-10: 15 mL via INTRAOCULAR

## 2012-11-10 MED ORDER — HYPROMELLOSE (GONIOSCOPIC) 2.5 % OP SOLN
OPHTHALMIC | Status: AC
  Filled 2012-11-10: qty 15

## 2012-11-10 MED ORDER — HYALURONIDASE HUMAN 150 UNIT/ML IJ SOLN
INTRAMUSCULAR | Status: AC
  Filled 2012-11-10: qty 1

## 2012-11-10 MED ORDER — GLYCOPYRROLATE 0.2 MG/ML IJ SOLN
INTRAMUSCULAR | Status: DC | PRN
  Administered 2012-11-10: .9 mg via INTRAVENOUS

## 2012-11-10 MED ORDER — BACITRACIN-POLYMYXIN B 500-10000 UNIT/GM OP OINT
TOPICAL_OINTMENT | OPHTHALMIC | Status: AC
  Filled 2012-11-10: qty 3.5

## 2012-11-10 MED ORDER — TEMAZEPAM 15 MG PO CAPS: 15.0000 mg | ORAL_CAPSULE | Freq: Every evening | ORAL | Status: DC | PRN

## 2012-11-10 MED ORDER — MIDAZOLAM HCL 5 MG/5ML IJ SOLN
INTRAMUSCULAR | Status: DC | PRN
  Administered 2012-11-10: 2 mg via INTRAVENOUS

## 2012-11-10 MED ORDER — PHENYLEPHRINE HCL 2.5 % OP SOLN
1.0000 [drp] | OPHTHALMIC | Status: AC | PRN
  Administered 2012-11-10: 1 [drp] via OPHTHALMIC
  Filled 2012-11-10: qty 3

## 2012-11-10 MED ORDER — CYCLOPENTOLATE HCL 1 % OP SOLN
1.0000 [drp] | OPHTHALMIC | Status: AC | PRN
  Administered 2012-11-10: 1 [drp] via OPHTHALMIC
  Filled 2012-11-10: qty 2

## 2012-11-10 MED ORDER — LATANOPROST 0.005 % OP SOLN
1.0000 [drp] | Freq: Every day | OPHTHALMIC | Status: DC
  Filled 2012-11-10: qty 2.5

## 2012-11-10 MED ORDER — MUPIROCIN 2 % EX OINT
TOPICAL_OINTMENT | CUTANEOUS | Status: AC
  Administered 2012-11-10: 1
  Filled 2012-11-10: qty 22

## 2012-11-10 MED ORDER — HYDROCODONE-ACETAMINOPHEN 5-325 MG PO TABS: 1.0000 | ORAL_TABLET | ORAL | Status: DC | PRN

## 2012-11-10 MED ORDER — MORPHINE SULFATE 2 MG/ML IJ SOLN
1.0000 mg | INTRAMUSCULAR | Status: DC | PRN
  Administered 2012-11-10: 2 mg via INTRAVENOUS
  Filled 2012-11-10: qty 1

## 2012-11-10 MED ORDER — DIGOXIN 125 MCG PO TABS
0.1250 mg | ORAL_TABLET | Freq: Every day | ORAL | Status: DC
  Filled 2012-11-10 (×2): qty 1

## 2012-11-10 MED ORDER — MAGNESIUM HYDROXIDE 400 MG/5ML PO SUSP: 15.0000 mL | Freq: Four times a day (QID) | ORAL | Status: DC | PRN

## 2012-11-10 MED ORDER — HEMOSTATIC AGENTS (NO CHARGE) OPTIME
TOPICAL | Status: DC | PRN
  Administered 2012-11-10: 1 via TOPICAL

## 2012-11-10 MED ORDER — SODIUM CHLORIDE 0.9 % IV SOLN
INTRAVENOUS | Status: DC
  Administered 2012-11-10: 35 mL/h via INTRAVENOUS

## 2012-11-10 MED ORDER — HYDROMORPHONE HCL PF 1 MG/ML IJ SOLN
0.2500 mg | INTRAMUSCULAR | Status: DC | PRN
  Administered 2012-11-10: 0.5 mg via INTRAVENOUS

## 2012-11-10 MED ORDER — DOCUSATE SODIUM 100 MG PO CAPS: 100.0000 mg | ORAL_CAPSULE | Freq: Every day | ORAL | Status: DC | PRN

## 2012-11-10 MED ORDER — INSULIN GLARGINE 100 UNIT/ML ~~LOC~~ SOLN
5.0000 [IU] | Freq: Every day | SUBCUTANEOUS | Status: DC
  Administered 2012-11-10: 5 [IU] via SUBCUTANEOUS

## 2012-11-10 MED ORDER — CEFAZOLIN SODIUM-DEXTROSE 2-3 GM-% IV SOLR
2.0000 g | INTRAVENOUS | Status: AC
  Administered 2012-11-10: 2 g via INTRAVENOUS
  Filled 2012-11-10: qty 50

## 2012-11-10 MED ORDER — HYDROMORPHONE HCL PF 1 MG/ML IJ SOLN
INTRAMUSCULAR | Status: AC
  Filled 2012-11-10: qty 1

## 2012-11-10 MED ORDER — EPINEPHRINE HCL 1 MG/ML IJ SOLN
INTRAOCULAR | Status: DC | PRN
  Administered 2012-11-10: 12:00:00

## 2012-11-10 MED ORDER — PNEUMOCOCCAL VAC POLYVALENT 25 MCG/0.5ML IJ INJ
0.5000 mL | INJECTION | INTRAMUSCULAR | Status: DC
  Filled 2012-11-10: qty 0.5

## 2012-11-10 MED ORDER — PROMETHAZINE HCL 25 MG/ML IJ SOLN: 6.2500 mg | INTRAMUSCULAR | Status: DC | PRN

## 2012-11-10 MED ORDER — SPIRONOLACTONE 12.5 MG HALF TABLET
12.5000 mg | ORAL_TABLET | Freq: Every day | ORAL | Status: DC
  Filled 2012-11-10 (×2): qty 1

## 2012-11-10 MED ORDER — PROVISC 10 MG/ML IO SOLN
INTRAOCULAR | Status: DC | PRN
  Administered 2012-11-10: 8.5 mg via INTRAOCULAR

## 2012-11-10 MED ORDER — ROCURONIUM BROMIDE 100 MG/10ML IV SOLN
INTRAVENOUS | Status: DC | PRN
  Administered 2012-11-10: 30 mg via INTRAVENOUS

## 2012-11-10 MED ORDER — NEOSTIGMINE METHYLSULFATE 1 MG/ML IJ SOLN
INTRAMUSCULAR | Status: DC | PRN
  Administered 2012-11-10: 5 mg via INTRAVENOUS

## 2012-11-10 MED ORDER — BACITRACIN-POLYMYXIN B 500-10000 UNIT/GM OP OINT
1.0000 "application " | TOPICAL_OINTMENT | Freq: Four times a day (QID) | OPHTHALMIC | Status: DC
  Filled 2012-11-10: qty 3.5

## 2012-11-10 MED ORDER — ONDANSETRON HCL 4 MG/2ML IJ SOLN
INTRAMUSCULAR | Status: DC | PRN
  Administered 2012-11-10: 4 mg via INTRAVENOUS

## 2012-11-10 MED ORDER — TROPICAMIDE 1 % OP SOLN
1.0000 [drp] | OPHTHALMIC | Status: AC | PRN
  Administered 2012-11-10: 1 [drp] via OPHTHALMIC
  Filled 2012-11-10: qty 3

## 2012-11-10 SURGICAL SUPPLY — 66 items
ACCESSORY FRAGMATOME (MISCELLANEOUS) IMPLANT
APL SRG 3 HI ABS STRL LF PLS (MISCELLANEOUS)
APPLICATOR DR MATTHEWS STRL (MISCELLANEOUS) IMPLANT
BALL CTTN LRG ABS STRL LF (GAUZE/BANDAGES/DRESSINGS) ×3
BLADE EYE CATARACT 19 1.4 BEAV (BLADE) IMPLANT
BLADE MVR KNIFE 19G (BLADE) IMPLANT
BLADE MVR KNIFE 20G (BLADE) ×2 IMPLANT
CANNULA ANT CHAM MAIN (OPHTHALMIC RELATED) IMPLANT
CANNULA FLEX TIP 25G (CANNULA) ×2 IMPLANT
CANNULA SUBRETINAL FLUID 20G (BLADE) ×2 IMPLANT
CLOTH BEACON ORANGE TIMEOUT ST (SAFETY) ×2 IMPLANT
CORDS BIPOLAR (ELECTRODE) IMPLANT
COTTONBALL LRG STERILE PKG (GAUZE/BANDAGES/DRESSINGS) ×6 IMPLANT
COVER MAYO STAND STRL (DRAPES) IMPLANT
DRAPE INCISE 51X51 W/FILM STRL (DRAPES) ×1 IMPLANT
DRAPE OPHTHALMIC 77X100 STRL (CUSTOM PROCEDURE TRAY) ×2 IMPLANT
EAGLE VIT/RET MICRO PIC 168 25 (MISCELLANEOUS) IMPLANT
ERASER HMR WETFIELD 23G BP (MISCELLANEOUS) IMPLANT
FILTER BLUE MILLIPORE (MISCELLANEOUS) ×4 IMPLANT
FILTER STRAW FLUID ASPIR (MISCELLANEOUS) ×2 IMPLANT
GLOVE SS BIOGEL STRL SZ 6.5 (GLOVE) ×1 IMPLANT
GLOVE SS BIOGEL STRL SZ 7 (GLOVE) ×1 IMPLANT
GLOVE SUPERSENSE BIOGEL SZ 6.5 (GLOVE) ×1
GLOVE SUPERSENSE BIOGEL SZ 7 (GLOVE) ×1
GLOVE SURG 8.5 LATEX PF (GLOVE) ×2 IMPLANT
GOWN STRL NON-REIN LRG LVL3 (GOWN DISPOSABLE) ×6 IMPLANT
ILLUMINATOR CHOW PICK 25GA (MISCELLANEOUS) ×2 IMPLANT
KIT BASIN OR (CUSTOM PROCEDURE TRAY) ×2 IMPLANT
KIT ROOM TURNOVER OR (KITS) ×2 IMPLANT
KNIFE CRESCENT 1.75 EDGEAHEAD (BLADE) ×2 IMPLANT
KNIFE GRIESHABER SHARP 2.5MM (MISCELLANEOUS) IMPLANT
MASK EYE SHIELD (GAUZE/BANDAGES/DRESSINGS) ×1 IMPLANT
NDL 18GX1X1/2 (RX/OR ONLY) (NEEDLE) ×1 IMPLANT
NDL 25GX 5/8IN NON SAFETY (NEEDLE) ×1 IMPLANT
NDL HYPO 30X.5 LL (NEEDLE) ×2 IMPLANT
NEEDLE 18GX1X1/2 (RX/OR ONLY) (NEEDLE) ×2 IMPLANT
NEEDLE 25GX 5/8IN NON SAFETY (NEEDLE) ×2 IMPLANT
NEEDLE 27GAX1X1/2 (NEEDLE) IMPLANT
NEEDLE HYPO 30X.5 LL (NEEDLE) ×4 IMPLANT
NS IRRIG 1000ML POUR BTL (IV SOLUTION) ×2 IMPLANT
PACK VITRECTOMY CUSTOM (CUSTOM PROCEDURE TRAY) ×2 IMPLANT
PAD ARMBOARD 7.5X6 YLW CONV (MISCELLANEOUS) ×4 IMPLANT
PAD EYE OVAL STERILE LF (GAUZE/BANDAGES/DRESSINGS) ×1 IMPLANT
PAK VITRECTOMY PIK 25 GA (OPHTHALMIC RELATED) IMPLANT
PROBE DIRECTIONAL LASER (MISCELLANEOUS) IMPLANT
REPL STRA BRUSH NDL (NEEDLE) ×1 IMPLANT
REPL STRA BRUSH NEEDLE (NEEDLE) ×2 IMPLANT
RESERVOIR BACK FLUSH (MISCELLANEOUS) ×2 IMPLANT
ROLLS DENTAL (MISCELLANEOUS) ×4 IMPLANT
SCRAPER DIAMOND 25GA (OPHTHALMIC RELATED) IMPLANT
SCRAPER DIAMOND DUST MEMBRANE (MISCELLANEOUS) ×2 IMPLANT
SPONGE SURGIFOAM ABS GEL 12-7 (HEMOSTASIS) ×2 IMPLANT
STOPCOCK 4 WAY LG BORE MALE ST (IV SETS) ×2 IMPLANT
SUT CHROMIC 7 0 TG140 8 (SUTURE) IMPLANT
SUT ETHILON 9 0 TG140 8 (SUTURE) ×2 IMPLANT
SUT POLY NON ABSORB 10-0 8 STR (SUTURE) IMPLANT
SUT SILK 4 0 RB 1 (SUTURE) IMPLANT
SYR 20CC LL (SYRINGE) ×2 IMPLANT
SYR 50ML LL SCALE MARK (SYRINGE) ×2 IMPLANT
SYR 5ML LL (SYRINGE) IMPLANT
SYR BULB 3OZ (MISCELLANEOUS) ×2 IMPLANT
SYR TB 1ML LUER SLIP (SYRINGE) ×2 IMPLANT
SYRINGE 10CC LL (SYRINGE) ×2 IMPLANT
TOWEL OR 17X24 6PK STRL BLUE (TOWEL DISPOSABLE) ×6 IMPLANT
WATER STERILE IRR 1000ML POUR (IV SOLUTION) ×2 IMPLANT
WIPE INSTRUMENT VISIWIPE 73X73 (MISCELLANEOUS) ×2 IMPLANT

## 2012-11-10 NOTE — Progress Notes (Signed)
Tomi Bamberger from [employer] informed of pt's surgery time. Page her if needed in holding per Tomi Bamberger.

## 2012-11-10 NOTE — Op Note (Signed)
NAMEELIZABELLA, [name]            ACCOUNT NO.:  [id number]  MEDICAL RECORD NO.:  [id number]  LOCATION:  [id number]                        FACILITY:  [facility]  PHYSICIAN:  Chrystie Nose. Zigmund Daniel, M.D. DATE OF BIRTH:  1946-09-17  DATE OF PROCEDURE:  11/10/2012 DATE OF DISCHARGE:                              OPERATIVE REPORT   ADMISSION DIAGNOSIS:  Macular hole, stage IV, right eye.  PROCEDURE:  Pars plana vitrectomy, removal of internal limiting membrane peel, retinal photocoagulation, gas fluid exchange, serum patch, all in the right eye.  SURGEON:  Chrystie Nose. Zigmund Daniel, M.D.  ASSISTANT:  Deatra Ina SA.  ANESTHESIA:  General.  DETAILS:  Usual prep and drape, 25-gauge trocars placed at 8 and 10 o'clock.  The MVR opening for 20-gauge instruments was opened at 2 o'clock.  Contact lens ring anchored into place at 6 and 12 o'clock. Provisc placed on the corneal surface and the flat contact lens was placed.  Pars plana vitrectomy was begun behind the pseudophakic, dense white membranes were encountered.  The vitrectomy is carried out in a core fashion down to the posterior pole.  Surface proliferation was seen.  This was removed with a vitreous cutter and the lighted pick. The diamond-dusted membrane scraper, was inserted in the 2 o'clock sclerotomy site, and drawn down to the macular surface.  The internal limiting membrane was then engaged with the diamond-dusted membrane scraper, for 360 degrees around the hole for 1 disk diameter around the hole.  The entire surface of the retina was cleansed and membranes came freely off of the retinal surface.  These membranes were drawn into the center of the macular hole and then trimmed with the vitreous cutter. The 30-degree prismatic lens was used for access to the mid periphery and far periphery.  The vitrectomy was carried out into the mid periphery and far periphery surface.  Proliferation and the vitreous base was trimmed for 360 degrees.  Once the  entire peripheral vitreous was removed, the endolaser was positioned in the eye, 800, 2 burns were placed around the retinal periphery.  The power was 1000 mW, 1000 microns, and 0.1 seconds each.  Additional cutting was performed around the macular hole.  Additional membrane scraping was performed around the hole to extend the removal of internal limiting membrane out further towards the arcades.  A total gas fluid exchange was carried out. Sufficient time was allowed for additional fluid to track down the walls of the eye and collect in the posterior segment.  Serum patch was prepared during this time, and C3FA and 17% concentration was prepared. The additional fluid was removed with a [place] [facility]ics brush.  Serum patch was delivered.  C3F8 was exchanged for intravitreal gas at 17% rate.  The instruments removed from the eye.  The lens ring was removed. The 25-gauge trocars were removed and the sclerotomy was closed with 9-0 nylon at 2 o'clock.  Wet-field cautery was used for hemostasis and to close the conjunctiva at 2 o'clock.  Polymyxin and gentamicin were irrigated into tenon space.  Atropine solution was applied.  Decadron 10 mg was injected into the lower subconjunctival space.  Marcaine was injected around the globe for postop pain.  Polysporin ophthalmic ointment,  a patch and shield were placed.  Closing pressure was 10 with a Pharmacologist.  COMPLICATIONS:  None.  DURATION:  1 hour.     Chrystie Nose. Zigmund Daniel, M.D.     JDM/MEDQ  D:  11/10/2012  T:  11/10/2012  Job:  [id number]

## 2012-11-10 NOTE — H&P (Signed)
I examined the patient today and there is no change in the medical status 

## 2012-11-10 NOTE — Preoperative (Signed)
Beta Blockers   Reason not to administer Beta Blockers:Not Applicable 

## 2012-11-10 NOTE — Transfer of Care (Signed)
Immediate Anesthesia Transfer of Care Note  Patient: [name]  Procedure(s) Performed: Procedure(s) (LRB) with comments: 25 GAUGE PARS PLANA VITRECTOMY WITH 20 GAUGE MVR PORT FOR MACULAR HOLE (Right) INSERTION OF GAS (Right) - C3F8 SERUM PATCH (Right)  Patient Location: PACU  Anesthesia Type:General  Level of Consciousness: awake, alert  and oriented  Airway & Oxygen Therapy: Patient Spontanous Breathing and Patient connected to nasal cannula oxygen  Post-op Assessment: Report given to PACU RN and Post -op Vital signs reviewed and stable  Post vital signs: Reviewed and stable  Complications: No apparent anesthesia complications

## 2012-11-10 NOTE — Brief Op Note (Signed)
Brief Operative note   Preoperative diagnosis:  Pre-Op Diagnosis Codes:    * Macular cyst, hole, or pseudohole of retina [362.54] Postoperative diagnosis  Post-Op Diagnosis Codes:    * Macular cyst, hole, or pseudohole of retina [362.54]  Procedures: Pars plana vitrectomy, membrane peel, serum patch, gas injection, laser therapy right eye  Surgeon:  Hayden Pedro, MD...  Assistant:  Deatra Ina SA    Anesthesia: General  Specimen: none  Estimated blood loss:  1cc  Complications: none  Patient sent to PACU in good condition  Composed by Hayden Pedro MD  Dictation number: [phone]

## 2012-11-10 NOTE — Anesthesia Preprocedure Evaluation (Addendum)
Anesthesia Evaluation  Patient identified by MRN, date of birth, ID band Patient awake    Reviewed: Allergy & Precautions, H&P , NPO status , Patient's Chart, lab work & pertinent test results  History of Anesthesia Complications Negative for: history of anesthetic complications  Airway Mallampati: I TM Distance: >3 FB Neck ROM: Full    Dental  (+) Edentulous Upper, Edentulous Lower and Dental Advisory Given   Pulmonary neg pulmonary ROS,    Pulmonary exam normal       Cardiovascular hypertension, Pt. on home beta blockers and Pt. on medications + CAD, + Past MI, + Peripheral Vascular Disease and +CHF + pacemaker + Cardiac Defibrillator  EF=20%   Neuro/Psych PSYCHIATRIC DISORDERS Anxiety Depression negative neurological ROS     GI/Hepatic negative GI ROS, Neg liver ROS,   Endo/Other  diabetes, Type 1, Insulin Dependent  Renal/GU Renal InsufficiencyRenal disease     Musculoskeletal negative musculoskeletal ROS (+)   Abdominal   Peds  Hematology negative hematology ROS (+)   Anesthesia Other Findings   Reproductive/Obstetrics negative OB ROS                        Anesthesia Physical Anesthesia Plan  ASA: IV  Anesthesia Plan: General   Post-op Pain Management:    Induction: Intravenous  Airway Management Planned: Oral ETT  Additional Equipment:   Intra-op Plan:   Post-operative Plan: Possible Post-op intubation/ventilation  Informed Consent: I have reviewed the patients History and Physical, chart, labs and discussed the procedure including the risks, benefits and alternatives for the proposed anesthesia with the patient or authorized representative who has indicated his/her understanding and acceptance.   Dental advisory given  Plan Discussed with: CRNA, Anesthesiologist and Surgeon  Anesthesia Plan Comments:        Anesthesia Quick Evaluation

## 2012-11-11 LAB — GLUCOSE, CAPILLARY: Glucose-Capillary: 202 mg/dL — ABNORMAL HIGH (ref 70–99)

## 2012-11-11 MED ORDER — PREDNISOLONE ACETATE 1 % OP SUSP: 1.0000 [drp] | Freq: Four times a day (QID) | OPHTHALMIC | Status: DC

## 2012-11-11 MED ORDER — HYDROCODONE-ACETAMINOPHEN 5-325 MG PO TABS: 1.0000 | ORAL_TABLET | ORAL | Status: DC | PRN

## 2012-11-11 MED ORDER — GATIFLOXACIN 0.5 % OP SOLN: 1.0000 [drp] | Freq: Four times a day (QID) | OPHTHALMIC | Status: DC

## 2012-11-11 MED ORDER — BRIMONIDINE TARTRATE 0.2 % OP SOLN: 1.0000 [drp] | Freq: Two times a day (BID) | OPHTHALMIC | Status: DC

## 2012-11-11 MED ORDER — BACITRACIN-POLYMYXIN B 500-10000 UNIT/GM OP OINT: 1.0000 "application " | TOPICAL_OINTMENT | Freq: Four times a day (QID) | OPHTHALMIC | Status: DC

## 2012-11-11 NOTE — Progress Notes (Signed)
11/11/2012, 6:35 AM  Mental Status:  Awake, Alert, Oriented  Anterior segment: Cornea  Clear    Anterior Chamber Clear    Lens:   Cataract  Intra Ocular Pressure 22 mmHg with Tonopen  Vitreous: Clear 95%gas bubble l  Retina:  Attached Good laser reaction     Final Diagnosis: Principal Problem:  *Macular hole Active Problems:  Proliferative diabetic retinopathy   Plan: start post operative eye drops.  Discharge to home.  Give post operative instructions  Hayden Pedro 11/11/2012, 6:35 AM

## 2012-11-11 NOTE — Discharge Summary (Signed)
Discharge summary not needed on [facility] patients per medical records. 

## 2012-11-13 NOTE — Anesthesia Postprocedure Evaluation (Signed)
Anesthesia Post Note  Patient: [name]  Procedure(s) Performed: Procedure(s) (LRB): 25 GAUGE PARS PLANA VITRECTOMY WITH 20 GAUGE MVR PORT FOR MACULAR HOLE (Right) C3F8 (Right) SERUM PATCH (Right)  Anesthesia type: general  Patient location: PACU  Post pain: Pain level controlled  Post assessment: Patient's Cardiovascular Status Stable  Last Vitals:  Filed Vitals:   11/11/12 0516  BP: 150/58  Pulse: 65  Temp: 36.8 C  Resp: 20    Post vital signs: Reviewed and stable  Level of consciousness: sedated  Complications: No apparent anesthesia complications

## 2012-11-14 ENCOUNTER — Encounter (HOSPITAL_COMMUNITY): Payer: Self-pay | Admitting: Ophthalmology

## 2012-11-16 ENCOUNTER — Inpatient Hospital Stay (INDEPENDENT_AMBULATORY_CARE_PROVIDER_SITE_OTHER): Payer: Medicare Other | Admitting: Ophthalmology

## 2012-11-16 DIAGNOSIS — H35349 Macular cyst, hole, or pseudohole, unspecified eye: Secondary | ICD-10-CM

## 2012-11-17 ENCOUNTER — Inpatient Hospital Stay (INDEPENDENT_AMBULATORY_CARE_PROVIDER_SITE_OTHER): Payer: Medicare Other | Admitting: Ophthalmology

## 2012-11-21 ENCOUNTER — Ambulatory Visit (INDEPENDENT_AMBULATORY_CARE_PROVIDER_SITE_OTHER): Payer: Medicare Other | Admitting: Internal Medicine

## 2012-11-21 ENCOUNTER — Encounter: Payer: Self-pay | Admitting: Internal Medicine

## 2012-11-21 VITALS — BP 122/58 | HR 57 | Ht 61.0 in | Wt 154.0 lb

## 2012-11-21 DIAGNOSIS — Z95810 Presence of automatic (implantable) cardiac defibrillator: Secondary | ICD-10-CM

## 2012-11-21 DIAGNOSIS — I5022 Chronic systolic (congestive) heart failure: Secondary | ICD-10-CM

## 2012-11-21 DIAGNOSIS — I429 Cardiomyopathy, unspecified: Secondary | ICD-10-CM

## 2012-11-21 LAB — ICD DEVICE OBSERVATION
AL IMPEDENCE ICD: 399 Ohm
BAMS-0001: 170 {beats}/min
CHARGE TIME: 10.039 s
FVT: 0
LV LEAD THRESHOLD: 1.25 V
PACEART VT: 0
RV LEAD AMPLITUDE: 26.25 mv
TOT-0001: 1
TZAT-0002ATACH: NEGATIVE
TZAT-0002ATACH: NEGATIVE
TZAT-0002ATACH: NEGATIVE
TZAT-0004SLOWVT: 8
TZAT-0005SLOWVT: 88 pct
TZAT-0011SLOWVT: 10 ms
TZAT-0012FASTVT: 200 ms
TZAT-0012SLOWVT: 200 ms
TZAT-0018ATACH: NEGATIVE
TZAT-0018FASTVT: NEGATIVE
TZAT-0019ATACH: 6 V
TZAT-0019ATACH: 6 V
TZAT-0019ATACH: 6 V
TZAT-0020ATACH: 1.5 ms
TZAT-0020FASTVT: 1.5 ms
TZON-0003SLOWVT: 320 ms
TZST-0001ATACH: 5
TZST-0001ATACH: 6
TZST-0001FASTVT: 2
TZST-0001FASTVT: 6
TZST-0001SLOWVT: 3
TZST-0001SLOWVT: 4
TZST-0001SLOWVT: 5
TZST-0002ATACH: NEGATIVE
TZST-0002ATACH: NEGATIVE
TZST-0002FASTVT: NEGATIVE
TZST-0002FASTVT: NEGATIVE
TZST-0002FASTVT: NEGATIVE
TZST-0003SLOWVT: 20 J
TZST-0003SLOWVT: 35 J
VENTRICULAR PACING ICD: 99.88 pct

## 2012-11-21 NOTE — Patient Instructions (Addendum)
Your physician recommends that you schedule a follow-up appointment in: WITH PAULA IN 3 MONTHS  Your physician recommends that you schedule a follow-up appointment in: WITH GT IN ONE YEAR

## 2012-11-21 NOTE — Assessment & Plan Note (Signed)
Her chronic systolic heart failure is currently class II. She will continue her current medical therapy, and maintain a low-sodium diet. I've encouraged the patient to increase her exercise duration.

## 2012-11-21 NOTE — Assessment & Plan Note (Signed)
Her Medtronic biventricular ICD is working normally. Her fluid index is good. She is ventricular pacing over 99% of the time. Her thresholds are all excellent. We'll plan to recheck in several months.

## 2012-11-21 NOTE — Progress Notes (Signed)
HPI [name] returns today for followup. She is a very pleasant 67 year old woman with a nonischemic cardiomyopathy, nonobstructive coronary disease, chronic systolic heart failure, status post biventricular ICD implantation. In the interim, she has done well from a heart failure perspective. She denies chest pain or shortness of breath and no peripheral edema. She is walking on the treadmill approximately 10 minutes a day. No syncope and no ICD shocks. She has lost weight over the last several months, as she notes that she gets full quicker. Allergies  Allergen Reactions  . Hydrocodone-Acetaminophen Other (See Comments)    hallucinations     Current Outpatient Prescriptions  Medication Sig Dispense Refill  . amLODipine (NORVASC) 10 MG tablet TAKE 1 TABLET BY MOUTH 2 TIMES DAILY.  60 tablet  6  . aspirin 81 MG tablet Take 81 mg by mouth daily.       . bacitracin-polymyxin b (POLYSPORIN) ophthalmic ointment Place 1 application into the right eye 4 (four) times daily. apply to eye every 12 hours while awake  3.5 g    . brimonidine (ALPHAGAN) 0.2 % ophthalmic solution Place 1 drop into the right eye 2 (two) times daily.  5 mL    . carvedilol (COREG) 25 MG tablet TAKE 1 TABLET BY MOUTH TWICE A DAY WITH MEALS.  60 tablet  6  . digoxin (LANOXIN) 0.125 MG tablet Take 0.125 mg by mouth daily.      Marland Kitchen docusate sodium (COLACE) 100 MG capsule Take 100 mg by mouth daily as needed. For constipation      . furosemide (LASIX) 40 MG tablet Take 40 mg by mouth daily.      Marland Kitchen gatifloxacin (ZYMAXID) 0.5 % SOLN Place 1 drop into the right eye 4 (four) times daily.      . hydrALAZINE (APRESOLINE) 50 MG tablet Take 75 mg by mouth 3 (three) times daily.      . insulin glargine (LANTUS) 100 UNIT/ML injection Inject 40 Units into the skin daily.       Marland Kitchen losartan (COZAAR) 50 MG tablet Take 50 mg by mouth daily.      . nitroGLYCERIN (NITROSTAT) 0.4 MG SL tablet Place 0.4 mg under the tongue every 5 (five) minutes as  needed. chest pain      . prednisoLONE acetate (PRED FORTE) 1 % ophthalmic suspension Place 1 drop into the right eye 4 (four) times daily.  5 mL    . spironolactone (ALDACTONE) 25 MG tablet Take 12.5 mg by mouth daily.       No current facility-administered medications for this visit.     Past Medical History  Diagnosis Date  . Diabetes mellitus, type 2   . Essential hypertension, benign   . Chronic systolic heart failure   . Coronary atherosclerosis of native coronary artery     Nonobstructive  . Nonischemic cardiomyopathy     LVEF 20%  . PAD (peripheral artery disease)     Left foot gangrene  . Anxiety   . Depression   . Myocardial infarction     not sure when  . CHF (congestive heart failure)   . Pacemaker   . ICD (implantable cardiac defibrillator) in place   . Constipation   . History of blood transfusion     ROS:   All systems reviewed and negative except as noted in the HPI.   Past Surgical History  Procedure Laterality Date  . Abdominal hysterectomy    . Left shoulder surgery  Rotator Cuff  . Breast lumpectomy      Bil  . Cardiac defibrillator placement      Medtronic [id number]   . Exploratory laparotomy with lysis of adhesions    . Bladder surgery  2011 BENIGN MASS REMOVED FROM BLADDER  . Eye surgery  BILATERAL CATARACTS REMOVAL  10 YEARS AGO PER PATIENT  . Pr vein bypass graft,aorto-fem-pop  05-2011    Left popliteal- PTA graft   . Viterectomy  11/10/2012    OD   Dr [name]  . 25 gauge pars plana vitrectomy with 20 gauge mvr port for macular hole Right 11/10/2012    Procedure: 54 GAUGE PARS PLANA VITRECTOMY WITH 20 GAUGE MVR PORT FOR MACULAR HOLE;  Surgeon: Sharon Pedro, MD;  Location: [facility];  Service: Ophthalmology;  Laterality: Right;  . Gas insertion Right 11/10/2012    Procedure: INSERTION OF GAS;  Surgeon: Sharon Pedro, MD;  Location: [facility];  Service: Ophthalmology;  Laterality: Right;  [id number]  . Serum patch Right 11/10/2012    Procedure: SERUM  PATCH;  Surgeon: Sharon Pedro, MD;  Location: [facility];  Service: Ophthalmology;  Laterality: Right;     Family History  Problem Relation Age of Onset  . Cirrhosis Father   . Cancer Mother     Ovarian  . Ovarian cancer Mother      History   Social History  . Marital Status: Divorced    Spouse Name: N/A    Number of Children: 2  . Years of Education: N/A   Occupational History  . DISABLED   . [employer] [facility]-full time     (out of work)   Social History Main Topics  . Smoking status: Never Smoker   . Smokeless tobacco: Never Used     Comment: tobacco use-no  . Alcohol Use: No  . Drug Use: No  . Sexually Active: Not on file   Other Topics Concern  . Not on file   Social History Narrative  . No narrative on file     BP 122/58  Pulse 57  Ht 5\' 1"  (1.549 m)  Wt 154 lb (69.854 kg)  BMI 29.11 kg/m2  Physical Exam:  Well appearing 67 year old woman, NAD HEENT: Unremarkable Neck:  6 cm JVD, no thyromegally Lungs:  Clearwith no wheezes, rales, or rhonchi. HEART:  Regular rate rhythm, no murmurs, no rubs, no clicks Abd:  soft, positive bowel sounds, no organomegally, no rebound, no guarding Ext:  2 plus pulses, no edema, no cyanosis, no clubbing Skin:  No rashes no nodules Neuro:  CN II through XII intact, motor grossly intact  DEVICE  Normal device function.  See PaceArt for details.   Assess/Plan:

## 2012-12-05 ENCOUNTER — Other Ambulatory Visit: Payer: Self-pay | Admitting: *Deleted

## 2012-12-05 ENCOUNTER — Encounter: Payer: Self-pay | Admitting: *Deleted

## 2012-12-05 DIAGNOSIS — I10 Essential (primary) hypertension: Secondary | ICD-10-CM

## 2012-12-08 ENCOUNTER — Telehealth: Payer: Self-pay | Admitting: Physician Assistant

## 2012-12-08 LAB — BASIC METABOLIC PANEL
Calcium: 9.5 mg/dL (ref 8.4–10.5)
Sodium: 129 mEq/L — ABNORMAL LOW (ref 135–145)

## 2012-12-08 NOTE — Telephone Encounter (Signed)
Rec'd a call from [place] about her labs.     Recent Labs Lab 12/08/12 1115  NA 129*  K 5.7*  CL 98  CO2 23  BUN 50*  CREATININE 1.41*  CALCIUM 9.5  GLUCOSE 627*   Discussed results with Dr Lattie Haw and called patient. She is currently asymptomatic. She admits she has not taken her insulin in several days and has eaten bread, sweet rolls and other non-diabetic foods.  Recommended she come to the ER but she does not wish to come in since she has no symptoms and the weather is bad. Advised her it is very important to restart her insulin. She is to contact her primary MD and get in to see them next week. Advised her to stop the spironolactone and the Cozaar. She is to hold the Lasix for a day. She is currently taking 1/2 tablet daily. She is to decrease this to 1/2 tablet every other day. Will send a message to the office to get her in for f/u labs/appt next week. Pt agrees.  Rosaria Ferries, PA-C 12/08/2012 9:21 PM

## 2012-12-09 ENCOUNTER — Encounter (INDEPENDENT_AMBULATORY_CARE_PROVIDER_SITE_OTHER): Payer: Medicare Other | Admitting: Ophthalmology

## 2012-12-12 ENCOUNTER — Telehealth: Payer: Self-pay | Admitting: Cardiology

## 2012-12-12 ENCOUNTER — Ambulatory Visit: Payer: Medicare Other | Admitting: Cardiology

## 2012-12-12 DIAGNOSIS — R899 Unspecified abnormal finding in specimens from other organs, systems and tissues: Secondary | ICD-10-CM

## 2012-12-12 NOTE — Telephone Encounter (Signed)
OK to see primary M.D.tomorrow after CMET.

## 2012-12-12 NOTE — Telephone Encounter (Signed)
Noted pt instructions per phone note 12-08-12 as follows:  Discussed results with Dr Lattie Haw and called patient. She is currently asymptomatic. She admits she has not taken her insulin in several days and has eaten bread, sweet rolls and other non-diabetic foods.  Recommended she come to the ER but she does not wish to come in since she has no symptoms and the weather is bad. Advised her it is very important to restart her insulin. She is to contact her primary MD and get in to see them next week. Advised her to stop the spironolactone and the Cozaar. She is to hold the Lasix for a day. She is currently taking 1/2 tablet daily. She is to decrease this to 1/2 tablet every other day. Will send a message to the office to get her in for f/u labs/appt next week. Pt agrees.  Rosaria Ferries, PA-C  12/08/2012  9:21 PM    Noted routed to RR however MD SM pt, please advise what labs to have pt draw per notation above, please advise

## 2012-12-12 NOTE — Telephone Encounter (Signed)
Spoke to pt to advise results/instructions. Pt understood. Pt taking medications as advised on 12-08-12 Placed orders in chart for CMET, pt advised she will see PCP tomorrow and wanted to wait to schedule OV after CMET results come back in per MD PCP may address medications to be held, pt noted she will have CMET drawn tomorrow morning, placed in chart as STAT to receive back asap to address, please advise if ok for pt to hold off on OV

## 2012-12-12 NOTE — Telephone Encounter (Signed)
Reviewed recommendations made by Ms Barrett and Dr. Lattie Haw on 3/6. She would need to have a CMET for the office visit.

## 2012-12-12 NOTE — Telephone Encounter (Signed)
PT STATES THAT SHE HAD A CALL ABOUT HER LABS OVER THE WEEKEND. HER SUGAR IS IN 600. SHE STATES SHE IS SEEING PCP FOR THAT THIS WEEK BUT SHE HAS QUESTIONS ABOUT MEDICATIONS THEY TOLD HER TO CHANGE.

## 2012-12-13 LAB — COMPREHENSIVE METABOLIC PANEL
ALT: 12 U/L (ref 0–35)
CO2: 21 mEq/L (ref 19–32)
Calcium: 8.9 mg/dL (ref 8.4–10.5)
Chloride: 107 mEq/L (ref 96–112)
Potassium: 5.4 mEq/L — ABNORMAL HIGH (ref 3.5–5.3)
Sodium: 136 mEq/L (ref 135–145)
Total Protein: 7.2 g/dL (ref 6.0–8.3)

## 2012-12-14 ENCOUNTER — Encounter (INDEPENDENT_AMBULATORY_CARE_PROVIDER_SITE_OTHER): Payer: Medicare Other | Admitting: Ophthalmology

## 2012-12-14 DIAGNOSIS — H35349 Macular cyst, hole, or pseudohole, unspecified eye: Secondary | ICD-10-CM

## 2012-12-14 NOTE — Telephone Encounter (Signed)
Noted pt had labs drawn and was reviewed/resulted/addressed in result note, pt made aware

## 2012-12-15 NOTE — Addendum Note (Signed)
Addended by: Shara Blazing A on: 12/15/2012 08:56 AM   Modules accepted: Orders

## 2012-12-23 ENCOUNTER — Telehealth: Payer: Self-pay | Admitting: Cardiology

## 2012-12-23 DIAGNOSIS — I10 Essential (primary) hypertension: Secondary | ICD-10-CM

## 2012-12-23 NOTE — Telephone Encounter (Signed)
PT STATES THAT HER BLOOD PRESSURE IS NOT STAYING DOWN SINCE SHE WAS TAKEN OFF LOSARTAN

## 2012-12-23 NOTE — Telephone Encounter (Signed)
Losartan was stopped 3/10.  See Lengthy telephone notes.  States that since stopping, BP has been running in the 160 range.  Please advise.  Has a f/u appt with you on 4/15.

## 2012-12-26 ENCOUNTER — Other Ambulatory Visit: Payer: Self-pay | Admitting: *Deleted

## 2012-12-26 ENCOUNTER — Encounter: Payer: Self-pay | Admitting: *Deleted

## 2012-12-26 MED ORDER — LOSARTAN POTASSIUM 50 MG PO TABS: 50.0000 mg | ORAL_TABLET | Freq: Every day | ORAL | Status: DC

## 2012-12-26 NOTE — Telephone Encounter (Signed)
Attempted to contact patient.  Message left for a return call.

## 2012-12-26 NOTE — Telephone Encounter (Signed)
Recommendations given to patient.  Verbalized understanding.

## 2012-12-26 NOTE — Telephone Encounter (Signed)
Resume losartan 50 mg once a day. Stay off Aldactone. She will need a BMET in 2 weeks.

## 2012-12-28 ENCOUNTER — Encounter: Payer: Self-pay | Admitting: *Deleted

## 2012-12-28 ENCOUNTER — Telehealth: Payer: Self-pay | Admitting: *Deleted

## 2012-12-28 DIAGNOSIS — I10 Essential (primary) hypertension: Secondary | ICD-10-CM

## 2012-12-28 NOTE — Telephone Encounter (Signed)
Mailed letter to advise labs due now

## 2012-12-29 ENCOUNTER — Other Ambulatory Visit: Payer: Self-pay | Admitting: *Deleted

## 2013-01-10 LAB — BASIC METABOLIC PANEL
Calcium: 8.6 mg/dL (ref 8.4–10.5)
Creat: 1.59 mg/dL — ABNORMAL HIGH (ref 0.50–1.10)
Glucose, Bld: 82 mg/dL (ref 70–99)
Sodium: 141 mEq/L (ref 135–145)

## 2013-01-11 ENCOUNTER — Encounter: Payer: Self-pay | Admitting: *Deleted

## 2013-01-17 ENCOUNTER — Other Ambulatory Visit: Payer: Self-pay | Admitting: *Deleted

## 2013-01-17 ENCOUNTER — Encounter: Payer: Self-pay | Admitting: Cardiology

## 2013-01-17 ENCOUNTER — Ambulatory Visit (INDEPENDENT_AMBULATORY_CARE_PROVIDER_SITE_OTHER): Payer: Medicare Other | Admitting: Cardiology

## 2013-01-17 VITALS — BP 130/92 | HR 60 | Ht 61.0 in | Wt 184.2 lb

## 2013-01-17 DIAGNOSIS — I10 Essential (primary) hypertension: Secondary | ICD-10-CM

## 2013-01-17 DIAGNOSIS — I447 Left bundle-branch block, unspecified: Secondary | ICD-10-CM

## 2013-01-17 DIAGNOSIS — Z95810 Presence of automatic (implantable) cardiac defibrillator: Secondary | ICD-10-CM

## 2013-01-17 DIAGNOSIS — I2510 Atherosclerotic heart disease of native coronary artery without angina pectoris: Secondary | ICD-10-CM

## 2013-01-17 DIAGNOSIS — I429 Cardiomyopathy, unspecified: Secondary | ICD-10-CM

## 2013-01-17 DIAGNOSIS — I5022 Chronic systolic (congestive) heart failure: Secondary | ICD-10-CM

## 2013-01-17 MED ORDER — FUROSEMIDE 20 MG PO TABS: 20.0000 mg | ORAL_TABLET | ORAL | Status: DC

## 2013-01-17 MED ORDER — FUROSEMIDE 20 MG PO TABS: 20.0000 mg | ORAL_TABLET | Freq: Every day | ORAL | Status: DC

## 2013-01-17 NOTE — Assessment & Plan Note (Signed)
Blood pressure trend is better in general.

## 2013-01-17 NOTE — Assessment & Plan Note (Signed)
Nonischemic cardiomyopathy as documented previously, LVEF 30%.

## 2013-01-17 NOTE — Patient Instructions (Addendum)
Your physician recommends that you schedule a follow-up appointment in: 1 month   Your physician recommends that you return for lab work in: Day prior to follow up visit   Your physician has recommended you make the following change in your medication:  1 - Take Lasix 40 mg daily x 1 week 2 - Take Lasix 20 mg daily thereafter

## 2013-01-17 NOTE — Assessment & Plan Note (Signed)
No device discharges, keep followup with Dr. Lovena Le.

## 2013-01-17 NOTE — Assessment & Plan Note (Signed)
Although her blood pressure trend is better, weight is increasing. She is off Aldactone due to progressive renal insufficiency and hyperkalemia, improved now. Lasix will be increased to 40 mg daily for a week, then down to 20 mg daily thereafter. Followup BMET for next office visit within the month.

## 2013-01-17 NOTE — Progress Notes (Signed)
Clinical Summary Ms. Marner is a 67 y.o.female last seen in February 2014 by Dr. Lovena Le. Defibrillator function was normal at that time, good fluid index, 99 percent pacing.  She reports stable dyspnea on exertion and fatigue, no chest pain, no palpitations or device discharges. Her weight is creeping up, although the weight today seems inaccurate. She states that her weight at home is "172." She is taking Lasix 20 mg every other day, Aldactone was stopped previously due to hyperkalemia and progressive renal insufficiency.  Lab work from 4/8 showed normal glucose 82, potassium 4.8, BUN 39 down from 60, and creatinine stable at 1.5. ECG today shows a ventricular paced rhythm.  Last echocardiogram from July 2013 demonstrated moderate LV chamber dilatation, moderate LVH, LVEF 30% with diffuse hypokinesis, mild aortic stenosis, moderate left atrial enlargement.   Allergies  Allergen Reactions  . Hydrocodone-Acetaminophen Other (See Comments)    hallucinations    Current Outpatient Prescriptions  Medication Sig Dispense Refill  . amLODipine (NORVASC) 10 MG tablet TAKE 1 TABLET BY MOUTH 2 TIMES DAILY.  60 tablet  6  . aspirin 81 MG tablet Take 81 mg by mouth daily.       . bacitracin-polymyxin b (POLYSPORIN) ophthalmic ointment Place 1 application into the right eye 4 (four) times daily. apply to eye every 12 hours while awake  3.5 g    . brimonidine (ALPHAGAN) 0.2 % ophthalmic solution Place 1 drop into the right eye 2 (two) times daily.  5 mL    . carvedilol (COREG) 25 MG tablet TAKE 1 TABLET BY MOUTH TWICE A DAY WITH MEALS.  60 tablet  6  . digoxin (LANOXIN) 0.125 MG tablet Take 0.125 mg by mouth daily.      Marland Kitchen docusate sodium (COLACE) 100 MG capsule Take 100 mg by mouth daily as needed. For constipation      . furosemide (LASIX) 20 MG tablet Take 1 tablet (20 mg total) by mouth every other day.  90 tablet  3  . hydrALAZINE (APRESOLINE) 50 MG tablet Take 75 mg by mouth 3 (three) times  daily.      . insulin glargine (LANTUS) 100 UNIT/ML injection Inject 40 Units into the skin daily.       Marland Kitchen losartan (COZAAR) 50 MG tablet Take 1 tablet (50 mg total) by mouth daily.  90 tablet  3  . nitroGLYCERIN (NITROSTAT) 0.4 MG SL tablet Place 0.4 mg under the tongue every 5 (five) minutes as needed. chest pain       No current facility-administered medications for this visit.    Past Medical History  Diagnosis Date  . Diabetes mellitus, type 2   . Essential hypertension, benign   . Chronic systolic heart failure   . Coronary atherosclerosis of native coronary artery     Nonobstructive  . Nonischemic cardiomyopathy     LVEF 20%  . PAD (peripheral artery disease)     Left foot gangrene  . Anxiety   . Depression   . ICD (implantable cardiac defibrillator) in place     Social History Ms. Kalama reports that she has never smoked. She has never used smokeless tobacco. Ms. Affleck reports that she does not drink alcohol.  Review of Systems Negative except as outlined.  Physical Examination Filed Vitals:   01/17/13 0820  BP: 130/92  Pulse: 60   Filed Weights   01/17/13 0820  Weight: 184 lb 4 oz (83.575 kg)    Overweight woman in no acute distress.  HEENT:  Conjunctiva and lids normal, oropharynx clear.  Neck: Supple, no carotid bruis, no thyromegaly.  Lungs: Clear with diminished breath sounds, nonlabored.  Cardiac: Indistinct PMI, regular rate and rhythm, 2/6 systolic murmur at the base, paradoxically split S2, no S3.  Thorax: Well-healed device pocket site on the upper left.  Extremities: 2+ edema, diminished distal pulses..  Skin: Warm and dry.   Problem List and Plan   Chronic systolic heart failure Although her blood pressure trend is better, weight is increasing. She is off Aldactone due to progressive renal insufficiency and hyperkalemia, improved now. Lasix will be increased to 40 mg daily for a week, then down to 20 mg daily thereafter. Followup BMET  for next office visit within the month.  ESSENTIAL HYPERTENSION, BENIGN Blood pressure trend is better in general.  ICD-Medtronic No device discharges, keep followup with Dr. Lovena Le.  UNSPECIFIED SECONDARY CARDIOMYOPATHY Nonischemic cardiomyopathy as documented previously, LVEF 30%.    Satira Sark, M.D., F.A.C.C.

## 2013-02-15 ENCOUNTER — Encounter: Payer: Self-pay | Admitting: *Deleted

## 2013-02-20 ENCOUNTER — Ambulatory Visit (INDEPENDENT_AMBULATORY_CARE_PROVIDER_SITE_OTHER): Payer: Medicare Other | Admitting: *Deleted

## 2013-02-20 DIAGNOSIS — I429 Cardiomyopathy, unspecified: Secondary | ICD-10-CM

## 2013-02-20 DIAGNOSIS — I447 Left bundle-branch block, unspecified: Secondary | ICD-10-CM

## 2013-02-20 DIAGNOSIS — I5022 Chronic systolic (congestive) heart failure: Secondary | ICD-10-CM

## 2013-02-20 LAB — ICD DEVICE OBSERVATION
AL AMPLITUDE: 2 mv
CHARGE TIME: 10.039 s
FVT: 0
LV LEAD IMPEDENCE ICD: 380 Ohm
LV LEAD THRESHOLD: 1.25 V
RV LEAD AMPLITUDE: 16.75 mv
RV LEAD IMPEDENCE ICD: 380 Ohm
TOT-0001: 1
TOT-0006: 20111228000000
TZAT-0001ATACH: 1
TZAT-0001ATACH: 2
TZAT-0001FASTVT: 1
TZAT-0001SLOWVT: 1
TZAT-0002ATACH: NEGATIVE
TZAT-0002ATACH: NEGATIVE
TZAT-0002FASTVT: NEGATIVE
TZAT-0011SLOWVT: 10 ms
TZAT-0012ATACH: 150 ms
TZAT-0012FASTVT: 200 ms
TZAT-0018ATACH: NEGATIVE
TZAT-0018ATACH: NEGATIVE
TZAT-0018SLOWVT: NEGATIVE
TZAT-0019ATACH: 6 V
TZAT-0019ATACH: 6 V
TZAT-0019SLOWVT: 8 V
TZAT-0020ATACH: 1.5 ms
TZAT-0020FASTVT: 1.5 ms
TZAT-0020SLOWVT: 1.5 ms
TZON-0003ATACH: 350 ms
TZON-0004VSLOWVT: 36
TZON-0005SLOWVT: 12
TZST-0001ATACH: 4
TZST-0001ATACH: 5
TZST-0001FASTVT: 3
TZST-0001FASTVT: 4
TZST-0001FASTVT: 5
TZST-0001SLOWVT: 3
TZST-0001SLOWVT: 4
TZST-0001SLOWVT: 6
TZST-0002ATACH: NEGATIVE
TZST-0002ATACH: NEGATIVE
TZST-0002FASTVT: NEGATIVE
TZST-0002FASTVT: NEGATIVE
TZST-0002FASTVT: NEGATIVE
TZST-0003SLOWVT: 20 J
TZST-0003SLOWVT: 35 J
TZST-0003SLOWVT: 35 J

## 2013-02-20 NOTE — Progress Notes (Signed)
ICD check with ICM in office.

## 2013-02-22 ENCOUNTER — Encounter (INDEPENDENT_AMBULATORY_CARE_PROVIDER_SITE_OTHER): Payer: Medicare Other | Admitting: Ophthalmology

## 2013-02-22 DIAGNOSIS — E11359 Type 2 diabetes mellitus with proliferative diabetic retinopathy without macular edema: Secondary | ICD-10-CM

## 2013-02-22 DIAGNOSIS — H35039 Hypertensive retinopathy, unspecified eye: Secondary | ICD-10-CM

## 2013-02-22 DIAGNOSIS — E1139 Type 2 diabetes mellitus with other diabetic ophthalmic complication: Secondary | ICD-10-CM

## 2013-02-22 DIAGNOSIS — H43819 Vitreous degeneration, unspecified eye: Secondary | ICD-10-CM

## 2013-02-22 DIAGNOSIS — H35349 Macular cyst, hole, or pseudohole, unspecified eye: Secondary | ICD-10-CM

## 2013-02-22 DIAGNOSIS — E1165 Type 2 diabetes mellitus with hyperglycemia: Secondary | ICD-10-CM

## 2013-02-22 DIAGNOSIS — I10 Essential (primary) hypertension: Secondary | ICD-10-CM

## 2013-02-23 ENCOUNTER — Telehealth: Payer: Self-pay | Admitting: *Deleted

## 2013-02-23 MED ORDER — LOSARTAN POTASSIUM 50 MG PO TABS: 50.0000 mg | ORAL_TABLET | Freq: Two times a day (BID) | ORAL | Status: DC

## 2013-02-23 NOTE — Telephone Encounter (Signed)
Incoming fax of patient medication refill request noted per patient pharmacy. Suzie Portela Sciota rx sent to pharmacy by e-script FOR LOSTARTAN 50MG  BID

## 2013-02-28 ENCOUNTER — Other Ambulatory Visit: Payer: Self-pay | Admitting: Cardiology

## 2013-02-28 LAB — BASIC METABOLIC PANEL
BUN: 31 mg/dL — ABNORMAL HIGH (ref 6–23)
CO2: 19 mEq/L (ref 19–32)
Chloride: 111 mEq/L (ref 96–112)
Creat: 1.23 mg/dL — ABNORMAL HIGH (ref 0.50–1.10)
Glucose, Bld: 170 mg/dL — ABNORMAL HIGH (ref 70–99)

## 2013-03-01 ENCOUNTER — Encounter: Payer: Self-pay | Admitting: *Deleted

## 2013-03-06 ENCOUNTER — Encounter: Payer: Self-pay | Admitting: Cardiology

## 2013-03-06 ENCOUNTER — Ambulatory Visit (INDEPENDENT_AMBULATORY_CARE_PROVIDER_SITE_OTHER): Payer: Medicare Other | Admitting: Cardiology

## 2013-03-06 VITALS — BP 158/64 | HR 56 | Ht 61.0 in | Wt 184.0 lb

## 2013-03-06 DIAGNOSIS — I429 Cardiomyopathy, unspecified: Secondary | ICD-10-CM

## 2013-03-06 DIAGNOSIS — I10 Essential (primary) hypertension: Secondary | ICD-10-CM

## 2013-03-06 DIAGNOSIS — Z95810 Presence of automatic (implantable) cardiac defibrillator: Secondary | ICD-10-CM

## 2013-03-06 DIAGNOSIS — I5022 Chronic systolic (congestive) heart failure: Secondary | ICD-10-CM

## 2013-03-06 MED ORDER — FUROSEMIDE 40 MG PO TABS: ORAL_TABLET | ORAL | Status: DC

## 2013-03-06 NOTE — Assessment & Plan Note (Signed)
She is volume overloaded although not in distress, remains over her optimal weight. She confirms that dietary intake has not changed substantially. May have noted some somatic improvement when we temporarily increased her Lasix, although her weight has not changed. Renal function has been relatively stable within the limitations discussed above. Plan will be to increase Lasix to 40 mg twice daily for a week, then decrease to 40 mg daily thereafter. We will go cautiously with further up titration. If we cannot achieve a reasonable diuresis as an outpatient, she may require inpatient IV diuresis plus minus inotropes. Follow up arranged with BMET.

## 2013-03-06 NOTE — Assessment & Plan Note (Signed)
Nonischemic cardiomyopathy with most recent LVEF 30% July 2013.

## 2013-03-06 NOTE — Patient Instructions (Addendum)
Your physician recommends that you schedule a follow-up appointment in: [place] KL/SM  Your physician recommends that you return for lab work in: [place] (BMET) [place] physician has recommended you make the following change in your medication:   1) TAKE LASIX 40MG  TWICE DAILY FOR ONE WEEK ( CAN TAKE 2 OF THE 20MG  TABLETS TO EQUAL THE 40MG )  2) AFTER ONE WEEK DECREASE LASIX TO 40MG  ONCE DAILY  Your physician recommends that you weigh, daily, at the same time every day, and in the same amount of clothing. Please record your daily weights on the handout provided and bring it to your next appointment.   1.5 Gram Low Sodium Diet A 1.5 gram sodium diet restricts the amount of salt in your diet. You can have no more than 1.5 grams (1500 miligrams) in 1 day. This can help lessen your risk for developing high blood pressure. This diet may also reduce your chance of having a heart attack or stroke. It is important that you know what to look for when choosing foods and drinks.  HOME CARE   Do not add salt to food.  Avoid convenience items and fast food.  Choose unsalted snack foods.  Buy products labeled "low sodium" or "no salt added" when possible.  Check food labels to learn how much sodium is in 1 serving.  When eating at a restaurant, ask that your food be prepared with less salt or none, if possible. The nutrition facts label is a good place to find how much sodium is in foods. Look for products with no more than 400 mg of sodium per serving. Remember that 1.5 g = 1500 mg. CHOOSING FOODS Grains  Avoid: Salted crackers and snack items. Some cereals, including instant hot cereals. Bread stuffing and biscuit mixes. Seasoned rice or pasta mixes.  Choose: Unsalted snack items. Low-sodium cereals, oats, puffed wheat and rice, shredded wheat. English muffins and bread. Pasta. Meats  Avoid:  Salted, canned, smoked, spiced, pickled meats, including  fish and poultry. Bacon, ham, sausage, cold cuts, hot dogs, anchovies.  Choose: Low-sodium canned tuna and salmon. Fresh or frozen meat, poultry, and fish. Dairy  Avoid: Processed cheese and spreads. Cottage cheese. Buttermilk and condensed milk. Regular cheese.  Choose:  Milk. Low-sodium cottage cheese. Yogurt. Sour cream. Low-sodium cheese. Fruits and Vegetables  Avoid:  Regular canned vegetables. Regular canned tomato sauce and paste. Frozen vegetables in sauces. Olives. Angie Fava. Relishes. Sauerkraut.  Choose:  Low-sodium canned vegetables. Low-sodium tomato sauce and paste. Frozen or fresh vegetables. Fresh and frozen fruit. Condiments  Avoid:  Canned and packaged gravies. Worcestershire sauce. Tartar sauce. Barbecue sauce. Soy sauce. Steak sauce. Ketchup. Onion, garlic, and table salt. Meat flavorings and tenderizers.  Choose:  Fresh and dried herbs and spices. Low-sodium varieties of mustard and ketchup. Lemon juice. Tabasco sauce. Horseradish. Document Released: 10/24/2010 Document Revised: 12/14/2011 Document Reviewed: 10/24/2010 [facility] Patient Information 2014 [place], [place].

## 2013-03-06 NOTE — Progress Notes (Signed)
Clinical Summary [name] is a 67 y.o.female last seen in April. With increasing weight, Lasix was temporarily increased at the last visit. Followup BMET showed potassium 4.6, BUN 31, creatinine 1.2.  Last echocardiogram from July 2013 demonstrated moderate LV chamber dilatation, moderate LVH, LVEF 30% with diffuse hypokinesis, mild aortic stenosis, moderate left atrial enlargement.  Weight is stable although still high for her. She is volume overloaded and we discussed advancing her Lasix, realizing that we may have some limitations based on response in renal function. She has had trouble with hyperkalemia, was taken off Aldactone due to this previously. If we cannot accomplish a reasonable diuresis with oral medications, she may require hospitalization for intravenous diuretics plus minus inotropes.   Allergies  Allergen Reactions  . Hydrocodone-Acetaminophen Other (See Comments)    hallucinations    Current Outpatient Prescriptions  Medication Sig Dispense Refill  . amLODipine (NORVASC) 10 MG tablet TAKE 1 TABLET BY MOUTH 2 TIMES DAILY.  60 tablet  6  . aspirin 81 MG tablet Take 81 mg by mouth daily.       . carvedilol (COREG) 25 MG tablet TAKE 1 TABLET BY MOUTH TWICE A DAY WITH MEALS.  60 tablet  6  . digoxin (LANOXIN) 0.125 MG tablet Take 0.125 mg by mouth daily.      Marland Kitchen docusate sodium (COLACE) 100 MG capsule Take 100 mg by mouth daily as needed. For constipation      . furosemide (LASIX) 40 MG tablet TAKE 40MG  TWICE DAILY FOR ONE WEEK, THEN DECREASE TO 40MG  ONCE DAILY THEREAFTER  60 tablet  6  . hydrALAZINE (APRESOLINE) 50 MG tablet Take 75 mg by mouth 3 (three) times daily.      . insulin glargine (LANTUS) 100 UNIT/ML injection Inject 40 Units into the skin daily.       Marland Kitchen losartan (COZAAR) 50 MG tablet Take 1 tablet (50 mg total) by mouth 2 (two) times daily.  90 tablet  0  . nitroGLYCERIN (NITROSTAT) 0.4 MG SL tablet Place 0.4 mg under the tongue every 5 (five) minutes as  needed. chest pain       No current facility-administered medications for this visit.    Past Medical History  Diagnosis Date  . Diabetes mellitus, type 2   . Essential hypertension, benign   . Chronic systolic heart failure   . Coronary atherosclerosis of native coronary artery     Nonobstructive  . Nonischemic cardiomyopathy     LVEF 20%  . PAD (peripheral artery disease)     Left foot gangrene  . Anxiety   . Depression   . ICD (implantable cardiac defibrillator) in place     Social History [name] reports that she has never smoked. She has never used smokeless tobacco. [name] reports that she does not drink alcohol.  Review of Systems No palpitations or device discharges. Chronic fatigue. No syncope. Otherwise negative.  Physical Examination Filed Vitals:   03/06/13 0850  BP: 158/64  Pulse: 56   Filed Weights   03/06/13 0850  Weight: 184 lb (83.462 kg)    Overweight woman in no acute distress.  HEENT: Conjunctiva and lids normal, oropharynx clear.  Neck: Increased JVP, no carotid bruis, no thyromegaly.  Lungs: Clear with diminished breath sounds, nonlabored.  Cardiac: Indistinct PMI, regular rate and rhythm, 2/6 systolic murmur at the base, paradoxically split S2, no S3.  Thorax: Well-healed device pocket site on the upper left.  Extremities: 2-3+ edema, diminished distal pulses.Marland Kitchen  Skin: Warm and dry.   Problem List and Plan   Chronic systolic heart failure She is volume overloaded although not in distress, remains over her optimal weight. She confirms that dietary intake has not changed substantially. May have noted some somatic improvement when we temporarily increased her Lasix, although her weight has not changed. Renal function has been relatively stable within the limitations discussed above. Plan will be to increase Lasix to 40 mg twice daily for a week, then decrease to 40 mg daily thereafter. We will go cautiously with further up titration.  If we cannot achieve a reasonable diuresis as an outpatient, she may require inpatient IV diuresis plus minus inotropes. Follow up arranged with BMET.  UNSPECIFIED SECONDARY CARDIOMYOPATHY Nonischemic cardiomyopathy with most recent LVEF 30% July 2013.  Biventricular implantable cardioverter-defibrillator in situ Followed by [name]. No device discharges.  ESSENTIAL HYPERTENSION, BENIGN Continue current regimen for now.    [name], M.D., F.A.C.C.

## 2013-03-06 NOTE — Assessment & Plan Note (Signed)
Followed by Dr. Lovena Le. No device discharges.

## 2013-03-06 NOTE — Assessment & Plan Note (Signed)
Continue current regimen for now

## 2013-03-21 ENCOUNTER — Encounter: Payer: Self-pay | Admitting: Internal Medicine

## 2013-03-21 ENCOUNTER — Ambulatory Visit: Payer: Medicare Other | Admitting: Neurosurgery

## 2013-03-21 ENCOUNTER — Encounter (INDEPENDENT_AMBULATORY_CARE_PROVIDER_SITE_OTHER): Payer: Medicare Other | Admitting: *Deleted

## 2013-03-21 DIAGNOSIS — I739 Peripheral vascular disease, unspecified: Secondary | ICD-10-CM

## 2013-03-21 DIAGNOSIS — Z48812 Encounter for surgical aftercare following surgery on the circulatory system: Secondary | ICD-10-CM

## 2013-03-23 ENCOUNTER — Other Ambulatory Visit: Payer: Self-pay | Admitting: Adult Health

## 2013-03-23 ENCOUNTER — Other Ambulatory Visit: Payer: Self-pay

## 2013-03-23 DIAGNOSIS — Z48812 Encounter for surgical aftercare following surgery on the circulatory system: Secondary | ICD-10-CM

## 2013-03-23 DIAGNOSIS — I739 Peripheral vascular disease, unspecified: Secondary | ICD-10-CM

## 2013-03-24 LAB — BASIC METABOLIC PANEL
BUN: 29 mg/dL — ABNORMAL HIGH (ref 6–23)
Calcium: 9.3 mg/dL (ref 8.4–10.5)
Chloride: 103 mEq/L (ref 96–112)
Creat: 1.29 mg/dL — ABNORMAL HIGH (ref 0.50–1.10)

## 2013-03-27 ENCOUNTER — Ambulatory Visit (INDEPENDENT_AMBULATORY_CARE_PROVIDER_SITE_OTHER): Payer: Medicare Other | Admitting: Adult Health

## 2013-03-27 ENCOUNTER — Encounter: Payer: Self-pay | Admitting: *Deleted

## 2013-03-27 ENCOUNTER — Encounter: Payer: Self-pay | Admitting: Adult Health

## 2013-03-27 ENCOUNTER — Telehealth: Payer: Self-pay | Admitting: *Deleted

## 2013-03-27 ENCOUNTER — Encounter: Payer: Self-pay | Admitting: Vascular Surgery

## 2013-03-27 VITALS — BP 160/66 | HR 56 | Ht 61.0 in | Wt 165.0 lb

## 2013-03-27 DIAGNOSIS — I10 Essential (primary) hypertension: Secondary | ICD-10-CM

## 2013-03-27 DIAGNOSIS — I5022 Chronic systolic (congestive) heart failure: Secondary | ICD-10-CM

## 2013-03-27 MED ORDER — HYDRALAZINE HCL 100 MG PO TABS: 100.0000 mg | ORAL_TABLET | Freq: Three times a day (TID) | ORAL | Status: DC

## 2013-03-27 NOTE — Assessment & Plan Note (Signed)
Blood pressures not optimal in patient with systolic dysfunction. She is on Coreg twice a day amlodipine 10 mg twice a day., And hydralazine 75 mg 3 times a day. We will plan to increase her dose to 100 mg 3 times a day for better blood pressure control. She will followup with Dr. [name] in one month.

## 2013-03-27 NOTE — Telephone Encounter (Signed)
Post ov changes per KL to increase Hydralazine to 100mg  TID. Left message for patient to return my call. New Rx sent to pharmacy.

## 2013-03-27 NOTE — Progress Notes (Signed)
HPI: [name] is a 67 year old patient of Dr. [name] we are following for ongoing assessment and management of chronic systolic heart failure, with an implantable ICD, and hypertension. Patient was last seen by Dr. [name] on 03/06/2013. The patient's last echocardiogram in July 2013 demonstrated LVEF of 30% with diffuse hypokinesis, mild aortic stenosis, and moderate left atrial enlargement. On last visit the patient's Lasix was increased to 40 mg twice a day for one week, and then decrease to 40 mg daily thereafter. He followup BMET was ordered. She was placed on a 1.5 g low sodium diet. Weight on last visit 184 pounds.  Labs on 03/23/2013 sodium 134 potassium 4.3 chloride 103 CO2 24 BUN 29 creatinine 1.29. She is here for followup concerning symptoms and continued management.  She has lost 19 lbs since being seen last with increased dose of lasix. Noew back to 40 mg daily. She is now complaining of cough and cold symptoms which she feels she got from her daughter.  Allergies  Allergen Reactions  . Hydrocodone-Acetaminophen Other (See Comments)    hallucinations    Current Outpatient Prescriptions  Medication Sig Dispense Refill  . amLODipine (NORVASC) 10 MG tablet TAKE 1 TABLET BY MOUTH 2 TIMES DAILY.  60 tablet  6  . aspirin 81 MG tablet Take 81 mg by mouth daily.       . carvedilol (COREG) 25 MG tablet TAKE 1 TABLET BY MOUTH TWICE A DAY WITH MEALS.  60 tablet  6  . digoxin (LANOXIN) 0.125 MG tablet TAKE ONE TABLET BY MOUTH ONCE DAILY  30 tablet  0  . docusate sodium (COLACE) 100 MG capsule Take 100 mg by mouth daily as needed. For constipation      . furosemide (LASIX) 40 MG tablet TAKE 40MG  TWICE DAILY FOR ONE WEEK, THEN DECREASE TO 40MG  ONCE DAILY THEREAFTER  60 tablet  6  . hydrALAZINE (APRESOLINE) 50 MG tablet Take 75 mg by mouth 3 (three) times daily.      . insulin glargine (LANTUS) 100 UNIT/ML injection Inject 40 Units into the skin daily.       Marland Kitchen losartan (COZAAR) 50  MG tablet Take 1 tablet (50 mg total) by mouth 2 (two) times daily.  90 tablet  0  . nitroGLYCERIN (NITROSTAT) 0.4 MG SL tablet Place 0.4 mg under the tongue every 5 (five) minutes as needed. chest pain       No current facility-administered medications for this visit.    Past Medical History  Diagnosis Date  . Diabetes mellitus, type 2   . Essential hypertension, benign   . Chronic systolic heart failure   . Coronary atherosclerosis of native coronary artery     Nonobstructive  . Nonischemic cardiomyopathy     LVEF 20%  . PAD (peripheral artery disease)     Left foot gangrene  . Anxiety   . Depression   . ICD (implantable cardiac defibrillator) in place     Past Surgical History  Procedure Laterality Date  . Abdominal hysterectomy    . Left shoulder surgery      Rotator Cuff  . Breast lumpectomy      Bil  . Cardiac defibrillator placement      Medtronic [id number]   . Exploratory laparotomy with lysis of adhesions    . Bladder surgery  2011 BENIGN MASS REMOVED FROM BLADDER  . Eye surgery  BILATERAL CATARACTS REMOVAL  10 YEARS AGO PER PATIENT  . Pr vein bypass graft,aorto-fem-pop  05-2011  Left popliteal- PTA graft   . Viterectomy  11/10/2012    OD   Dr Zigmund Daniel  . 25 gauge pars plana vitrectomy with 20 gauge mvr port for macular hole Right 11/10/2012    Procedure: 82 GAUGE PARS PLANA VITRECTOMY WITH 20 GAUGE MVR PORT FOR MACULAR HOLE;  Surgeon: Hayden Pedro, MD;  Location: [place];  Service: Ophthalmology;  Laterality: Right;  . Gas insertion Right 11/10/2012    Procedure: INSERTION OF GAS;  Surgeon: Hayden Pedro, MD;  Location: [place];  Service: Ophthalmology;  Laterality: Right;  C3F8  . Serum patch Right 11/10/2012    Procedure: SERUM PATCH;  Surgeon: Hayden Pedro, MD;  Location: [place];  Service: Ophthalmology;  Laterality: Right;    [id number] of systems complete and found to be negative unless listed above  PHYSICAL EXAM BP 160/66  Pulse 56  Ht 5\' 1"  (1.549 m)   Wt 165 lb (74.844 kg)  BMI 31.19 kg/m2  General: Well developed, well nourished, in no acute distress Head: Eyes PERRLA, No xanthomas.   Normal cephalic and atramatic  Lungs: Clear bilaterally to auscultation and percussion.Some crackles cleared with cough Heart: HRRR S1 S2 1/6 systolic murmur, without MRG.  Pulses are 2+ & equal.            Left carotid bruit. No JVD.  No abdominal bruits. No femoral bruits. Abdomen: Bowel sounds are positive, abdomen soft and non-tender without masses or                  Hernia's noted. Msk:  Back normal, normal gait. Normal strength and tone for age. Extremities: No clubbing, cyanosis or edema.  DP +1 Neuro: Alert and oriented X 3. Psych:  Good affect, responds appropriately   ASSESSMENT AND PLAN

## 2013-03-27 NOTE — Progress Notes (Deleted)
Name: [name]    DOB: [birth year]  Age: 67 y.o.  MR#: [id number]       PCP:  [name], MD      Insurance: Payor: MEDICARE / Plan: MEDICARE PART A AND B / Product Type: *No Product type* /   CC:    Chief Complaint  Patient presents with  . Congestive Heart Failure  . Hypertension   PT BROUGHT IN WEIGHTS, NOTED LAST OV 184LBS, WHICH IS THE SAME AS THE 4-14 OV VS Filed Vitals:   03/27/13 1128  BP: 160/66  Pulse: 56  Height: 5\' 1"  (1.549 m)  Weight: 165 lb (74.844 kg)    Weights Current Weight  03/27/13 165 lb (74.844 kg)  03/06/13 184 lb (83.462 kg)  01/17/13 184 lb 4 oz (83.575 kg)    Blood Pressure  BP Readings from Last 3 Encounters:  03/27/13 160/66  03/06/13 158/64  01/17/13 130/92     Admit date:  (Not on file) Last encounter with RMR:  03/23/2013   Allergy Hydrocodone-acetaminophen  Current Outpatient Prescriptions  Medication Sig Dispense Refill  . amLODipine (NORVASC) 10 MG tablet TAKE 1 TABLET BY MOUTH 2 TIMES DAILY.  60 tablet  6  . aspirin 81 MG tablet Take 81 mg by mouth daily.       . carvedilol (COREG) 25 MG tablet TAKE 1 TABLET BY MOUTH TWICE A DAY WITH MEALS.  60 tablet  6  . digoxin (LANOXIN) 0.125 MG tablet TAKE ONE TABLET BY MOUTH ONCE DAILY  30 tablet  0  . docusate sodium (COLACE) 100 MG capsule Take 100 mg by mouth daily as needed. For constipation      . furosemide (LASIX) 40 MG tablet TAKE 40MG  TWICE DAILY FOR ONE WEEK, THEN DECREASE TO 40MG  ONCE DAILY THEREAFTER  60 tablet  6  . hydrALAZINE (APRESOLINE) 50 MG tablet Take 75 mg by mouth 3 (three) times daily.      . insulin glargine (LANTUS) 100 UNIT/ML injection Inject 40 Units into the skin daily.       Marland Kitchen losartan (COZAAR) 50 MG tablet Take 1 tablet (50 mg total) by mouth 2 (two) times daily.  90 tablet  0  . nitroGLYCERIN (NITROSTAT) 0.4 MG SL tablet Place 0.4 mg under the tongue every 5 (five) minutes as needed. chest pain       No current facility-administered medications for  this visit.    Discontinued Meds:   There are no discontinued medications.  Patient Active Problem List   Diagnosis Date Noted  . Macular hole 10/12/2012  . Proliferative diabetic retinopathy 10/12/2012  . Atherosclerosis of native arteries of the extremities with intermittent claudication 03/22/2012  . Biventricular implantable cardioverter-defibrillator in situ 05/27/2011  . PAD (peripheral artery disease) 04/30/2011  . HYPERLIPIDEMIA 12/31/2009  . CORONARY ATHEROSCLEROSIS NATIVE CORONARY ARTERY 12/31/2009  . UNSPECIFIED SECONDARY CARDIOMYOPATHY 12/31/2009  . Chronic systolic heart failure 0000000  . ESSENTIAL HYPERTENSION, BENIGN 12/05/2009  . LEFT BUNDLE BRANCH BLOCK 12/05/2009    LABS    Component Value Date/Time   NA 134* 03/23/2013 0740   NA 141 02/28/2013 0821   NA 141 01/10/2013 1205   K 4.3 03/23/2013 0740   K 4.6 02/28/2013 0821   K 4.8 01/10/2013 1205   CL 103 03/23/2013 0740   CL 111 02/28/2013 0821   CL 112 01/10/2013 1205   CO2 24 03/23/2013 0740   CO2 19 02/28/2013 0821   CO2 20 01/10/2013 1205   GLUCOSE 358* 03/23/2013  0740   GLUCOSE 170* 02/28/2013 0821   GLUCOSE 82 01/10/2013 1205   BUN 29* 03/23/2013 0740   BUN 31* 02/28/2013 0821   BUN 39* 01/10/2013 1205   CREATININE 1.29* 03/23/2013 0740   CREATININE 1.23* 02/28/2013 0821   CREATININE 1.59* 01/10/2013 1205   CREATININE 1.31* 11/10/2012 0956   CREATININE 0.65 05/29/2011 0530   CREATININE 0.69 05/27/2011 1117   CALCIUM 9.3 03/23/2013 0740   CALCIUM 9.1 02/28/2013 0821   CALCIUM 8.6 01/10/2013 1205   GFRNONAA 41* 11/10/2012 0956   GFRNONAA >60 05/29/2011 0530   GFRNONAA >60 05/27/2011 1117   GFRAA 48* 11/10/2012 0956   GFRAA >60 05/29/2011 0530   GFRAA >60 05/27/2011 1117   CMP     Component Value Date/Time   NA 134* 03/23/2013 0740   K 4.3 03/23/2013 0740   CL 103 03/23/2013 0740   CO2 24 03/23/2013 0740   GLUCOSE 358* 03/23/2013 0740   BUN 29* 03/23/2013 0740   CREATININE 1.29* 03/23/2013 0740   CREATININE 1.31* 11/10/2012  0956   CALCIUM 9.3 03/23/2013 0740   PROT 7.2 12/12/2012 1730   ALBUMIN 4.3 12/12/2012 1730   AST 18 12/12/2012 1730   ALT 12 12/12/2012 1730   ALKPHOS 88 12/12/2012 1730   BILITOT 0.4 12/12/2012 1730   GFRNONAA 41* 11/10/2012 0956   GFRAA 48* 11/10/2012 0956       Component Value Date/Time   WBC 7.7 11/10/2012 0956   WBC 11.1* 06/01/2011 0605   WBC 10.0 05/31/2011 0635   HGB 11.7* 11/10/2012 0956   HGB 10.6* 06/01/2011 0605   HGB 8.4* 05/31/2011 0635   HCT 33.7* 11/10/2012 0956   HCT 31.7* 06/01/2011 0605   HCT 23.3* 05/31/2011 0635   MCV 83.0 11/10/2012 0956   MCV 85.4 06/01/2011 0605   MCV 86.0 05/31/2011 0635    Lipid Panel     Component Value Date/Time   CHOL 185 01/17/2010 2004   TRIG 51 01/17/2010 2004   HDL 67 01/17/2010 2004   CHOLHDL 2.8 Ratio 01/17/2010 2004   VLDL 10 01/17/2010 2004   LDLCALC 108* 01/17/2010 2004    ABG    Component Value Date/Time   PHART 7.382 08/14/2010 0745   PCO2ART 36.9 08/14/2010 0745   PO2ART 90.0 08/14/2010 0745   HCO3 23.2 08/14/2010 0759   TCO2 25 08/14/2010 0759   ACIDBASEDEF 3.0* 08/14/2010 0759   O2SAT 59.0 08/14/2010 0759     No results found for this basename: TSH   BNP (last 3 results) No results found for this basename: PROBNP,  in the last 8760 hours Cardiac Panel (last 3 results) No results found for this basename: CKTOTAL, CKMB, TROPONINI, RELINDX,  in the last 72 hours  Iron/TIBC/Ferritin No results found for this basename: iron, tibc, ferritin     EKG Orders placed in visit on 01/17/13  . EKG 12-LEAD     Prior Assessment and Plan Problem List as of 03/27/2013   Proliferative diabetic retinopathy   HYPERLIPIDEMIA   ESSENTIAL HYPERTENSION, BENIGN   Last Assessment & Plan   03/06/2013 Office Visit Written 03/06/2013  9:39 AM by Satira Sark, MD     Continue current regimen for now.    CORONARY ATHEROSCLEROSIS NATIVE CORONARY ARTERY   Last Assessment & Plan   07/20/2012 Office Visit Written 07/20/2012 10:40 AM by Satira Sark, MD     Nonobstructive. No active angina.    UNSPECIFIED SECONDARY CARDIOMYOPATHY   Last Assessment & Plan  03/06/2013 Office Visit Written 03/06/2013  9:38 AM by Satira Sark, MD     Nonischemic cardiomyopathy with most recent LVEF 30% July 2013.    LEFT BUNDLE BRANCH BLOCK   Chronic systolic heart failure   Last Assessment & Plan   03/06/2013 Office Visit Written 03/06/2013  9:38 AM by Satira Sark, MD     She is volume overloaded although not in distress, remains over her optimal weight. She confirms that dietary intake has not changed substantially. May have noted some somatic improvement when we temporarily increased her Lasix, although her weight has not changed. Renal function has been relatively stable within the limitations discussed above. Plan will be to increase Lasix to 40 mg twice daily for a week, then decrease to 40 mg daily thereafter. We will go cautiously with further up titration. If we cannot achieve a reasonable diuresis as an outpatient, she may require inpatient IV diuresis plus minus inotropes. Follow up arranged with BMET.    PAD (peripheral artery disease)   Last Assessment & Plan   01/28/2012 Office Visit Written 01/28/2012  9:25 AM by Satira Sark, MD     Followed by Dr. Donnetta Hutching.    Biventricular implantable cardioverter-defibrillator in situ   Last Assessment & Plan   03/06/2013 Office Visit Written 03/06/2013  9:39 AM by Satira Sark, MD     Followed by Dr. Lovena Le. No device discharges.    Atherosclerosis of native arteries of the extremities with intermittent claudication   Macular hole       Imaging: No results found.

## 2013-03-27 NOTE — Assessment & Plan Note (Signed)
She has lost 19 pounds with increased medication regimen using Lasix 40 mg twice a day for one week, and is now on 40 mg daily. Old her on this dose and she has responded well and is maintaining her weight. She is reinforced on low sodium diet. Review of labs demonstrates stable renal function. I discussed this with Dr. [name] who does not wish to place her on spironolactone with history of hyperkalemia and renal failure. She is to continue daily weights and avoidance of salt.

## 2013-03-27 NOTE — Patient Instructions (Addendum)
Your physician recommends that you schedule a follow-up appointment in: 1 MONTH

## 2013-03-27 NOTE — Addendum Note (Signed)
Addended by: Truett Mainland on: 03/27/2013 01:25 PM   Modules accepted: Orders

## 2013-04-24 ENCOUNTER — Encounter (INDEPENDENT_AMBULATORY_CARE_PROVIDER_SITE_OTHER): Payer: Medicare Other | Admitting: Ophthalmology

## 2013-04-24 DIAGNOSIS — H35039 Hypertensive retinopathy, unspecified eye: Secondary | ICD-10-CM

## 2013-04-24 DIAGNOSIS — H43819 Vitreous degeneration, unspecified eye: Secondary | ICD-10-CM

## 2013-04-24 DIAGNOSIS — H35349 Macular cyst, hole, or pseudohole, unspecified eye: Secondary | ICD-10-CM

## 2013-04-24 DIAGNOSIS — I10 Essential (primary) hypertension: Secondary | ICD-10-CM

## 2013-04-24 DIAGNOSIS — E1165 Type 2 diabetes mellitus with hyperglycemia: Secondary | ICD-10-CM

## 2013-04-24 DIAGNOSIS — E11359 Type 2 diabetes mellitus with proliferative diabetic retinopathy without macular edema: Secondary | ICD-10-CM

## 2013-04-24 DIAGNOSIS — E1139 Type 2 diabetes mellitus with other diabetic ophthalmic complication: Secondary | ICD-10-CM

## 2013-04-24 NOTE — H&P (Signed)
[name] is an 67 y.o. female.   Chief Complaint:loss of vision right eye HPI: previous macular hole surgery.  No re opened  Right eye  Past Medical History  Diagnosis Date  . Diabetes mellitus, type 2   . Essential hypertension, benign   . Chronic systolic heart failure   . Coronary atherosclerosis of native coronary artery     Nonobstructive  . Nonischemic cardiomyopathy     LVEF 20%  . PAD (peripheral artery disease)     Left foot gangrene  . Anxiety   . Depression   . ICD (implantable cardiac defibrillator) in place     Past Surgical History  Procedure Laterality Date  . Abdominal hysterectomy    . Left shoulder surgery      Rotator Cuff  . Breast lumpectomy      Bil  . Cardiac defibrillator placement      Medtronic [id number]   . Exploratory laparotomy with lysis of adhesions    . Bladder surgery  2011 BENIGN MASS REMOVED FROM BLADDER  . Eye surgery  BILATERAL CATARACTS REMOVAL  10 YEARS AGO PER PATIENT  . Pr vein bypass graft,aorto-fem-pop  05-2011    Left popliteal- PTA graft   . Viterectomy  11/10/2012    OD   Dr Zigmund Daniel  . 25 gauge pars plana vitrectomy with 20 gauge mvr port for macular hole Right 11/10/2012    Procedure: 94 GAUGE PARS PLANA VITRECTOMY WITH 20 GAUGE MVR PORT FOR MACULAR HOLE;  Surgeon: Hayden Pedro, MD;  Location: [place];  Service: Ophthalmology;  Laterality: Right;  . Gas insertion Right 11/10/2012    Procedure: INSERTION OF GAS;  Surgeon: Hayden Pedro, MD;  Location: [place];  Service: Ophthalmology;  Laterality: Right;  C3F8  . Serum patch Right 11/10/2012    Procedure: SERUM PATCH;  Surgeon: Hayden Pedro, MD;  Location: [place];  Service: Ophthalmology;  Laterality: Right;    Family History  Problem Relation Age of Onset  . Cirrhosis Father   . Cancer Mother     Ovarian  . Ovarian cancer Mother    Social History:  reports that she has never smoked. She has never used smokeless tobacco. She reports that she does not drink alcohol  or use illicit drugs.  Allergies:  Allergies  Allergen Reactions  . Hydrocodone-Acetaminophen Other (See Comments)    hallucinations    No prescriptions prior to admission    Review of systems otherwise negative  There were no vitals taken for this visit.  Physical exam: Mental status: oriented x3. Eyes: See eye exam associated with this date of surgery in media tab.  Scanned in by scanning center Ears, Nose, Throat: within normal limits Neck: Within Normal limits General: within normal limits Chest: Within normal limits Breast: deferred Heart: Within normal limits Abdomen: Within normal limits GU: deferred Extremities: within normal limits Skin: within normal limits  Assessment/Plan Recurrent macular hole right eye Plan: To [facility] for Pars plana vitrectomy, laser treatment, serum patch, membrane peel. Gas injection right eye  Hayden Pedro 04/24/2013, 11:29 AM

## 2013-04-28 ENCOUNTER — Encounter (HOSPITAL_COMMUNITY): Payer: Self-pay | Admitting: Pharmacy Technician

## 2013-05-01 ENCOUNTER — Encounter: Payer: Self-pay | Admitting: Cardiology

## 2013-05-01 ENCOUNTER — Ambulatory Visit (INDEPENDENT_AMBULATORY_CARE_PROVIDER_SITE_OTHER): Payer: Medicare Other | Admitting: Cardiology

## 2013-05-01 ENCOUNTER — Other Ambulatory Visit: Payer: Self-pay | Admitting: *Deleted

## 2013-05-01 VITALS — BP 127/52 | HR 55 | Ht 61.0 in | Wt 165.0 lb

## 2013-05-01 DIAGNOSIS — I2510 Atherosclerotic heart disease of native coronary artery without angina pectoris: Secondary | ICD-10-CM

## 2013-05-01 DIAGNOSIS — Z95810 Presence of automatic (implantable) cardiac defibrillator: Secondary | ICD-10-CM

## 2013-05-01 DIAGNOSIS — I5022 Chronic systolic (congestive) heart failure: Secondary | ICD-10-CM

## 2013-05-01 MED ORDER — LOSARTAN POTASSIUM 50 MG PO TABS: 50.0000 mg | ORAL_TABLET | Freq: Two times a day (BID) | ORAL | Status: DC

## 2013-05-01 MED ORDER — DIGOXIN 125 MCG PO TABS: 0.1250 mg | ORAL_TABLET | Freq: Every day | ORAL | Status: DC

## 2013-05-01 NOTE — Assessment & Plan Note (Signed)
Symptomatically much better controlled, weight is down and blood pressure trend is also improving. Will keep her cardiac regimen stable, plan to see her back over the next 6 weeks.

## 2013-05-01 NOTE — Assessment & Plan Note (Signed)
Nonobstructive by cardiac catheterization, no active angina symptoms.

## 2013-05-01 NOTE — Assessment & Plan Note (Signed)
No device shocks. Keep followup with Dr. Lovena Le.

## 2013-05-01 NOTE — Patient Instructions (Addendum)
Your physician recommends that you schedule a follow-up appointment in: 6 weeks  

## 2013-05-01 NOTE — Progress Notes (Signed)
Clinical Summary Ms. Soliday is a 67 y.o.female seen recently by [name] NP in late June. She had lost a significant amount of fluid weight at that time through adjustments in her Lasix. Antihypertensive regimen was uptitrated. I reviewed her home blood pressure and weight checks. Blood pressure trend is much better and her weight has been stable, by her scales around 158-160 pounds. She states that she has been trying to watch the sodium intake in her diet.  Lab work from June 19 showed potassium 4.3, BUN 29, creatinine 1.2, stable overall. Her weight is stable 165 pounds.  She will be undergoing a vitrectomy with Dr. Zigmund Daniel in [place]. Preoperative clearance form to follow.   Allergies  Allergen Reactions  . Hydrocodone-Acetaminophen Other (See Comments)    hallucinations    Current Outpatient Prescriptions  Medication Sig Dispense Refill  . amLODipine (NORVASC) 10 MG tablet Take 10 mg by mouth 2 (two) times daily.      Marland Kitchen aspirin 81 MG tablet Take 81 mg by mouth daily.       . carvedilol (COREG) 25 MG tablet Take 25 mg by mouth 2 (two) times daily with a meal.      . digoxin (LANOXIN) 0.125 MG tablet Take 0.125 mg by mouth daily.      Marland Kitchen docusate sodium (COLACE) 100 MG capsule Take 100 mg by mouth daily as needed. For constipation      . furosemide (LASIX) 40 MG tablet Take 40 mg by mouth daily.      . hydrALAZINE (APRESOLINE) 100 MG tablet Take 1 tablet (100 mg total) by mouth 3 (three) times daily.  90 tablet  3  . insulin glargine (LANTUS) 100 UNIT/ML injection Inject 40 Units into the skin daily.       . insulin lispro (HUMALOG) 100 UNIT/ML injection Inject 0-8 Units into the skin every evening. Sliding scale: 200-249=2 units, 250-299=4 units, 300-349=6 units, >350=8 units      . losartan (COZAAR) 50 MG tablet Take 1 tablet (50 mg total) by mouth 2 (two) times daily.  90 tablet  0  . nitroGLYCERIN (NITROSTAT) 0.4 MG SL tablet Place 0.4 mg under the tongue every 5 (five)  minutes as needed. chest pain       No current facility-administered medications for this visit.    Past Medical History  Diagnosis Date  . Diabetes mellitus, type 2   . Essential hypertension, benign   . Chronic systolic heart failure   . Coronary atherosclerosis of native coronary artery     Nonobstructive  . Nonischemic cardiomyopathy     LVEF 20%  . PAD (peripheral artery disease)     Left foot gangrene  . Anxiety   . Depression   . ICD (implantable cardiac defibrillator) in place     Social History Ms. Schach reports that she has never smoked. She has never used smokeless tobacco. Ms. Couey reports that she does not drink alcohol.  Review of Systems No palpitations or syncope. No recent cardiac hospitalizations. Reports compliance with her medications. Otherwise negative.  Physical Examination Filed Vitals:   05/01/13 0837  BP: 127/52  Pulse: 55   Filed Weights   05/01/13 0837  Weight: 165 lb (74.844 kg)    Overweight woman, comfortable at rest.  HEENT: Conjunctiva and lids normal, oropharynx clear.  Neck: Increased JVP, no carotid bruis, no thyromegaly.  Lungs: Clear with diminished breath sounds, nonlabored.  Cardiac: Indistinct PMI, regular rate and rhythm, 2/6 systolic murmur at the  base, paradoxically split S2, no S3.  Thorax: Well-healed device pocket site on the upper left.  Extremities: 1-2+ edema, left greater than right as before, diminished distal pulses..  Skin: Warm and dry.   Problem List and Plan   Chronic systolic heart failure Symptomatically much better controlled, weight is down and blood pressure trend is also improving. Will keep her cardiac regimen stable, plan to see her back over the next 6 weeks.  Biventricular implantable cardioverter-defibrillator in situ No device shocks. Keep followup with Dr. Lovena Le.  CORONARY ATHEROSCLEROSIS NATIVE CORONARY ARTERY Nonobstructive by cardiac catheterization, no active angina  symptoms.    Satira Sark, M.D., F.A.C.C.

## 2013-05-02 ENCOUNTER — Other Ambulatory Visit: Payer: Self-pay | Admitting: *Deleted

## 2013-05-02 MED ORDER — DIGOXIN 125 MCG PO TABS: 0.1250 mg | ORAL_TABLET | Freq: Every day | ORAL | Status: DC

## 2013-05-05 ENCOUNTER — Encounter (HOSPITAL_COMMUNITY): Payer: Self-pay | Admitting: *Deleted

## 2013-05-05 NOTE — Progress Notes (Addendum)
Pt denies SOB and chest pain. [facility] provides ICD/ Pacer services to pt. Pt provided with pre-op arrival time of 9:00 AM, advised to take half dose of Insulin and  BP medications by Dr. Zigmund Daniel. Pt also made aware to not take any NSAIDs ie: Ibuprofen, Advil, Naproxen or any medication containing Aspirin.

## 2013-05-08 MED ORDER — CEFAZOLIN SODIUM-DEXTROSE 2-3 GM-% IV SOLR
2.0000 g | INTRAVENOUS | Status: AC
  Administered 2013-05-09: 2 g via INTRAVENOUS
  Filled 2013-05-08: qty 50

## 2013-05-09 ENCOUNTER — Encounter (HOSPITAL_COMMUNITY): Payer: Self-pay | Admitting: Anesthesiology

## 2013-05-09 ENCOUNTER — Ambulatory Visit (HOSPITAL_COMMUNITY): Payer: Medicare Other

## 2013-05-09 ENCOUNTER — Ambulatory Visit (HOSPITAL_COMMUNITY)
Admission: RE | Admit: 2013-05-09 | Discharge: 2013-05-10 | Disposition: A | Discharge status: Home / Self Care | Payer: Medicare Other | Source: Ambulatory Visit | Attending: Ophthalmology | Admitting: Ophthalmology

## 2013-05-09 ENCOUNTER — Encounter (HOSPITAL_COMMUNITY)
Admission: RE | Disposition: A | Discharge status: Home / Self Care | Payer: Self-pay | Source: Ambulatory Visit | Attending: Ophthalmology

## 2013-05-09 ENCOUNTER — Ambulatory Visit (HOSPITAL_COMMUNITY): Payer: Medicare Other | Admitting: Anesthesiology

## 2013-05-09 DIAGNOSIS — E1139 Type 2 diabetes mellitus with other diabetic ophthalmic complication: Secondary | ICD-10-CM | POA: Insufficient documentation

## 2013-05-09 DIAGNOSIS — H35349 Macular cyst, hole, or pseudohole, unspecified eye: Secondary | ICD-10-CM

## 2013-05-09 DIAGNOSIS — I5022 Chronic systolic (congestive) heart failure: Secondary | ICD-10-CM | POA: Insufficient documentation

## 2013-05-09 DIAGNOSIS — I428 Other cardiomyopathies: Secondary | ICD-10-CM | POA: Insufficient documentation

## 2013-05-09 DIAGNOSIS — Z95810 Presence of automatic (implantable) cardiac defibrillator: Secondary | ICD-10-CM | POA: Insufficient documentation

## 2013-05-09 DIAGNOSIS — H35341 Macular cyst, hole, or pseudohole, right eye: Secondary | ICD-10-CM

## 2013-05-09 DIAGNOSIS — I10 Essential (primary) hypertension: Secondary | ICD-10-CM | POA: Insufficient documentation

## 2013-05-09 DIAGNOSIS — E11359 Type 2 diabetes mellitus with proliferative diabetic retinopathy without macular edema: Secondary | ICD-10-CM | POA: Insufficient documentation

## 2013-05-09 HISTORY — PX: GAS/FLUID EXCHANGE: SHX5334

## 2013-05-09 HISTORY — PX: LASER PHOTO ABLATION: SHX5942

## 2013-05-09 HISTORY — PX: MEMBRANE PEEL: SHX5967

## 2013-05-09 HISTORY — PX: SERUM PATCH: SHX6091

## 2013-05-09 HISTORY — PX: 25 GAUGE PARS PLANA VITRECTOMY WITH 20 GAUGE MVR PORT FOR MACULAR HOLE: SHX6096

## 2013-05-09 LAB — CBC
MCH: 28.4 pg (ref 26.0–34.0)
MCHC: 32.9 g/dL (ref 30.0–36.0)
Platelets: 186 10*3/uL (ref 150–400)
RDW: 14.8 % (ref 11.5–15.5)

## 2013-05-09 LAB — AUTOLOGOUS SERUM PATCH PREP

## 2013-05-09 LAB — BASIC METABOLIC PANEL
CO2: 23 mEq/L (ref 19–32)
Chloride: 102 mEq/L (ref 96–112)
Glucose, Bld: 163 mg/dL — ABNORMAL HIGH (ref 70–99)
Sodium: 138 mEq/L (ref 135–145)

## 2013-05-09 SURGERY — 25 GAUGE PARS PLANA VITRECTOMY WITH 20 GAUGE MVR PORT FOR MACULAR HOLE
Anesthesia: General | Site: Eye | Laterality: Right | Wound class: Clean

## 2013-05-09 MED ORDER — TRIAMCINOLONE ACETONIDE 40 MG/ML IJ SUSP
INTRAMUSCULAR | Status: DC | PRN
  Administered 2013-05-09: .1 mL

## 2013-05-09 MED ORDER — GATIFLOXACIN 0.5 % OP SOLN
1.0000 [drp] | Freq: Four times a day (QID) | OPHTHALMIC | Status: DC
  Filled 2013-05-09: qty 2.5

## 2013-05-09 MED ORDER — BUPIVACAINE HCL (PF) 0.75 % IJ SOLN
INTRAMUSCULAR | Status: DC | PRN
  Administered 2013-05-09: 10 mL

## 2013-05-09 MED ORDER — MIDAZOLAM HCL 5 MG/5ML IJ SOLN
INTRAMUSCULAR | Status: DC | PRN
  Administered 2013-05-09: 1 mg via INTRAVENOUS

## 2013-05-09 MED ORDER — SODIUM HYALURONATE 10 MG/ML IO SOLN
INTRAOCULAR | Status: AC
  Filled 2013-05-09: qty 0.85

## 2013-05-09 MED ORDER — DEXTROSE 5 % IV SOLN
INTRAVENOUS | Status: DC | PRN
  Administered 2013-05-09: 14:00:00 via INTRAVENOUS

## 2013-05-09 MED ORDER — LOSARTAN POTASSIUM 50 MG PO TABS
50.0000 mg | ORAL_TABLET | Freq: Two times a day (BID) | ORAL | Status: DC
  Administered 2013-05-09: 50 mg via ORAL
  Filled 2013-05-09 (×3): qty 1

## 2013-05-09 MED ORDER — PHENYLEPHRINE HCL 2.5 % OP SOLN: 1.0000 [drp] | OPHTHALMIC | Status: DC | PRN

## 2013-05-09 MED ORDER — BSS IO SOLN
INTRAOCULAR | Status: DC | PRN
  Administered 2013-05-09: 15 mL via INTRAOCULAR

## 2013-05-09 MED ORDER — DIGOXIN 125 MCG PO TABS
0.1250 mg | ORAL_TABLET | Freq: Every day | ORAL | Status: DC
  Filled 2013-05-09: qty 1

## 2013-05-09 MED ORDER — BACITRACIN-POLYMYXIN B 500-10000 UNIT/GM OP OINT
TOPICAL_OINTMENT | OPHTHALMIC | Status: AC
  Filled 2013-05-09: qty 3.5

## 2013-05-09 MED ORDER — ATROPINE SULFATE 1 % OP SOLN
OPHTHALMIC | Status: AC
  Filled 2013-05-09: qty 2

## 2013-05-09 MED ORDER — TETRACAINE HCL 0.5 % OP SOLN
2.0000 [drp] | Freq: Once | OPHTHALMIC | Status: DC
  Filled 2013-05-09: qty 2

## 2013-05-09 MED ORDER — ROCURONIUM BROMIDE 100 MG/10ML IV SOLN
INTRAVENOUS | Status: DC | PRN
  Administered 2013-05-09: 40 mg via INTRAVENOUS

## 2013-05-09 MED ORDER — GATIFLOXACIN 0.5 % OP SOLN
1.0000 [drp] | OPHTHALMIC | Status: AC | PRN
  Administered 2013-05-09 (×3): 1 [drp] via OPHTHALMIC
  Filled 2013-05-09: qty 2.5

## 2013-05-09 MED ORDER — OXYCODONE-ACETAMINOPHEN 5-325 MG PO TABS
1.0000 | ORAL_TABLET | ORAL | Status: DC | PRN
  Administered 2013-05-09: 2 via ORAL
  Filled 2013-05-09: qty 2

## 2013-05-09 MED ORDER — PHENYLEPHRINE HCL 2.5 % OP SOLN
OPHTHALMIC | Status: AC
  Filled 2013-05-09: qty 2

## 2013-05-09 MED ORDER — 0.9 % SODIUM CHLORIDE (POUR BTL) OPTIME
TOPICAL | Status: DC | PRN
  Administered 2013-05-09: 1000 mL

## 2013-05-09 MED ORDER — LIDOCAINE HCL 2 % IJ SOLN
INTRAMUSCULAR | Status: AC
  Filled 2013-05-09: qty 20

## 2013-05-09 MED ORDER — PROPOFOL 10 MG/ML IV BOLUS
INTRAVENOUS | Status: DC | PRN
  Administered 2013-05-09: 140 mg via INTRAVENOUS

## 2013-05-09 MED ORDER — HYDRALAZINE HCL 50 MG PO TABS
100.0000 mg | ORAL_TABLET | Freq: Three times a day (TID) | ORAL | Status: DC
  Administered 2013-05-09 (×2): 100 mg via ORAL
  Filled 2013-05-09 (×5): qty 2

## 2013-05-09 MED ORDER — MORPHINE SULFATE 2 MG/ML IJ SOLN: 1.0000 mg | INTRAMUSCULAR | Status: DC | PRN

## 2013-05-09 MED ORDER — TRIAMCINOLONE ACETONIDE 40 MG/ML IJ SUSP
INTRAMUSCULAR | Status: AC
  Filled 2013-05-09: qty 5

## 2013-05-09 MED ORDER — DEXAMETHASONE SODIUM PHOSPHATE 10 MG/ML IJ SOLN
INTRAMUSCULAR | Status: AC
  Filled 2013-05-09: qty 1

## 2013-05-09 MED ORDER — CYCLOPENTOLATE HCL 1 % OP SOLN: 1.0000 [drp] | OPHTHALMIC | Status: DC | PRN

## 2013-05-09 MED ORDER — GATIFLOXACIN 0.5 % OP SOLN: 1.0000 [drp] | OPHTHALMIC | Status: DC | PRN

## 2013-05-09 MED ORDER — BUPIVACAINE HCL (PF) 0.75 % IJ SOLN
INTRAMUSCULAR | Status: AC
  Filled 2013-05-09: qty 10

## 2013-05-09 MED ORDER — TROPICAMIDE 1 % OP SOLN: 1.0000 [drp] | OPHTHALMIC | Status: DC | PRN

## 2013-05-09 MED ORDER — ATROPINE SULFATE 1 % OP SOLN
OPHTHALMIC | Status: DC | PRN
  Administered 2013-05-09: 1 [drp] via OPHTHALMIC

## 2013-05-09 MED ORDER — ACETAMINOPHEN 325 MG PO TABS
325.0000 mg | ORAL_TABLET | ORAL | Status: DC | PRN
  Administered 2013-05-09: 650 mg via ORAL
  Filled 2013-05-09: qty 2

## 2013-05-09 MED ORDER — CYCLOPENTOLATE HCL 1 % OP SOLN
1.0000 [drp] | OPHTHALMIC | Status: AC | PRN
  Administered 2013-05-09 (×3): 1 [drp] via OPHTHALMIC
  Filled 2013-05-09: qty 2

## 2013-05-09 MED ORDER — INSULIN GLARGINE 100 UNIT/ML ~~LOC~~ SOLN
40.0000 [IU] | Freq: Every day | SUBCUTANEOUS | Status: DC
  Filled 2013-05-09: qty 0.4

## 2013-05-09 MED ORDER — BRIMONIDINE TARTRATE 0.2 % OP SOLN
1.0000 [drp] | Freq: Two times a day (BID) | OPHTHALMIC | Status: DC
  Filled 2013-05-09: qty 5

## 2013-05-09 MED ORDER — ACETAZOLAMIDE SODIUM 500 MG IJ SOLR
INTRAMUSCULAR | Status: AC
  Filled 2013-05-09: qty 500

## 2013-05-09 MED ORDER — HYPROMELLOSE (GONIOSCOPIC) 2.5 % OP SOLN
OPHTHALMIC | Status: AC
  Filled 2013-05-09: qty 15

## 2013-05-09 MED ORDER — LATANOPROST 0.005 % OP SOLN
1.0000 [drp] | Freq: Every day | OPHTHALMIC | Status: DC
  Filled 2013-05-09: qty 2.5

## 2013-05-09 MED ORDER — DOCUSATE SODIUM 100 MG PO CAPS: 100.0000 mg | ORAL_CAPSULE | Freq: Every day | ORAL | Status: DC | PRN

## 2013-05-09 MED ORDER — DOCUSATE SODIUM 100 MG PO CAPS
100.0000 mg | ORAL_CAPSULE | Freq: Two times a day (BID) | ORAL | Status: DC
  Administered 2013-05-09: 100 mg via ORAL
  Filled 2013-05-09: qty 1

## 2013-05-09 MED ORDER — NEOSTIGMINE METHYLSULFATE 1 MG/ML IJ SOLN
INTRAMUSCULAR | Status: DC | PRN
  Administered 2013-05-09: 4 mg via INTRAVENOUS

## 2013-05-09 MED ORDER — LIDOCAINE HCL (CARDIAC) 20 MG/ML IV SOLN
INTRAVENOUS | Status: DC | PRN
  Administered 2013-05-09: 90 mg via INTRAVENOUS

## 2013-05-09 MED ORDER — BSS PLUS IO SOLN
INTRAOCULAR | Status: AC
  Filled 2013-05-09: qty 500

## 2013-05-09 MED ORDER — AMLODIPINE BESYLATE 10 MG PO TABS
10.0000 mg | ORAL_TABLET | Freq: Two times a day (BID) | ORAL | Status: DC
  Administered 2013-05-09: 10 mg via ORAL
  Filled 2013-05-09 (×3): qty 1

## 2013-05-09 MED ORDER — NITROGLYCERIN 0.4 MG SL SUBL: 0.4000 mg | SUBLINGUAL_TABLET | SUBLINGUAL | Status: DC | PRN

## 2013-05-09 MED ORDER — FENTANYL CITRATE 0.05 MG/ML IJ SOLN
INTRAMUSCULAR | Status: DC | PRN
  Administered 2013-05-09: 25 ug via INTRAVENOUS
  Administered 2013-05-09: 50 ug via INTRAVENOUS

## 2013-05-09 MED ORDER — MAGNESIUM HYDROXIDE 400 MG/5ML PO SUSP: 15.0000 mL | Freq: Four times a day (QID) | ORAL | Status: DC | PRN

## 2013-05-09 MED ORDER — FENTANYL CITRATE 0.05 MG/ML IJ SOLN: 25.0000 ug | INTRAMUSCULAR | Status: DC | PRN

## 2013-05-09 MED ORDER — POLYMYXIN B SULFATE 500000 UNITS IJ SOLR
INTRAMUSCULAR | Status: AC
  Filled 2013-05-09: qty 1

## 2013-05-09 MED ORDER — SODIUM CHLORIDE 0.45 % IV SOLN
INTRAVENOUS | Status: DC
  Administered 2013-05-09: 16:00:00 via INTRAVENOUS

## 2013-05-09 MED ORDER — SODIUM CHLORIDE 0.9 % IJ SOLN
INTRAMUSCULAR | Status: DC | PRN
  Administered 2013-05-09: 14:00:00

## 2013-05-09 MED ORDER — EPINEPHRINE HCL 1 MG/ML IJ SOLN
INTRAOCULAR | Status: DC | PRN
  Administered 2013-05-09: 14:00:00

## 2013-05-09 MED ORDER — INSULIN ASPART 100 UNIT/ML ~~LOC~~ SOLN
0.0000 [IU] | SUBCUTANEOUS | Status: DC
  Administered 2013-05-09: 2 [IU] via SUBCUTANEOUS
  Administered 2013-05-09 – 2013-05-10 (×3): 5 [IU] via SUBCUTANEOUS

## 2013-05-09 MED ORDER — ARTIFICIAL TEARS OP OINT
TOPICAL_OINTMENT | OPHTHALMIC | Status: DC | PRN
  Administered 2013-05-09: 1 via OPHTHALMIC

## 2013-05-09 MED ORDER — GENTAMICIN SULFATE 40 MG/ML IJ SOLN
INTRAMUSCULAR | Status: AC
  Filled 2013-05-09: qty 2

## 2013-05-09 MED ORDER — HYDROCODONE-ACETAMINOPHEN 5-325 MG PO TABS: 1.0000 | ORAL_TABLET | ORAL | Status: DC | PRN

## 2013-05-09 MED ORDER — ACETAZOLAMIDE SODIUM 500 MG IJ SOLR
500.0000 mg | Freq: Once | INTRAMUSCULAR | Status: AC
  Administered 2013-05-10: 500 mg via INTRAVENOUS
  Filled 2013-05-09: qty 500

## 2013-05-09 MED ORDER — ONDANSETRON HCL 4 MG/2ML IJ SOLN: 4.0000 mg | Freq: Four times a day (QID) | INTRAMUSCULAR | Status: DC | PRN

## 2013-05-09 MED ORDER — SODIUM HYALURONATE 10 MG/ML IO SOLN
INTRAOCULAR | Status: DC | PRN
  Administered 2013-05-09: 0.85 mL via INTRAOCULAR

## 2013-05-09 MED ORDER — CARVEDILOL 25 MG PO TABS
25.0000 mg | ORAL_TABLET | Freq: Two times a day (BID) | ORAL | Status: DC
  Administered 2013-05-09: 25 mg via ORAL
  Filled 2013-05-09 (×3): qty 1

## 2013-05-09 MED ORDER — GLYCOPYRROLATE 0.2 MG/ML IJ SOLN
INTRAMUSCULAR | Status: DC | PRN
  Administered 2013-05-09: 0.6 mg via INTRAVENOUS

## 2013-05-09 MED ORDER — TEMAZEPAM 15 MG PO CAPS: 15.0000 mg | ORAL_CAPSULE | Freq: Every evening | ORAL | Status: DC | PRN

## 2013-05-09 MED ORDER — SODIUM CHLORIDE 0.9 % IV SOLN
INTRAVENOUS | Status: DC | PRN
  Administered 2013-05-09 (×2): via INTRAVENOUS

## 2013-05-09 MED ORDER — TROPICAMIDE 1 % OP SOLN
1.0000 [drp] | OPHTHALMIC | Status: AC | PRN
  Administered 2013-05-09 (×3): 1 [drp] via OPHTHALMIC
  Filled 2013-05-09: qty 3

## 2013-05-09 MED ORDER — BACITRACIN-POLYMYXIN B 500-10000 UNIT/GM OP OINT
1.0000 "application " | TOPICAL_OINTMENT | Freq: Four times a day (QID) | OPHTHALMIC | Status: DC
  Filled 2013-05-09: qty 3.5

## 2013-05-09 MED ORDER — MINERAL OIL LIGHT 100 % EX OIL
TOPICAL_OIL | CUTANEOUS | Status: AC
  Filled 2013-05-09: qty 25

## 2013-05-09 MED ORDER — PHENYLEPHRINE HCL 2.5 % OP SOLN
1.0000 [drp] | OPHTHALMIC | Status: AC | PRN
  Administered 2013-05-09 (×3): 1 [drp] via OPHTHALMIC

## 2013-05-09 MED ORDER — BACITRACIN-POLYMYXIN B 500-10000 UNIT/GM OP OINT
TOPICAL_OINTMENT | OPHTHALMIC | Status: DC | PRN
  Administered 2013-05-09: 1 via OPHTHALMIC

## 2013-05-09 MED ORDER — ONDANSETRON HCL 4 MG/2ML IJ SOLN
INTRAMUSCULAR | Status: DC | PRN
  Administered 2013-05-09: 4 mg via INTRAVENOUS

## 2013-05-09 MED ORDER — SODIUM CHLORIDE 0.9 % IV SOLN
INTRAVENOUS | Status: DC
  Administered 2013-05-09: 10:00:00 via INTRAVENOUS

## 2013-05-09 MED ORDER — DEXAMETHASONE SODIUM PHOSPHATE 10 MG/ML IJ SOLN
INTRAMUSCULAR | Status: DC | PRN
  Administered 2013-05-09: 10 mg

## 2013-05-09 MED ORDER — FUROSEMIDE 40 MG PO TABS
40.0000 mg | ORAL_TABLET | Freq: Every day | ORAL | Status: DC
  Filled 2013-05-09: qty 1

## 2013-05-09 MED ORDER — BSS IO SOLN
INTRAOCULAR | Status: AC
  Filled 2013-05-09: qty 15

## 2013-05-09 MED ORDER — EPINEPHRINE HCL 1 MG/ML IJ SOLN
INTRAMUSCULAR | Status: AC
Start: 2013-05-09 — End: 2013-05-09
  Filled 2013-05-09: qty 1

## 2013-05-09 MED ORDER — PREDNISOLONE ACETATE 1 % OP SUSP
1.0000 [drp] | Freq: Four times a day (QID) | OPHTHALMIC | Status: DC
  Filled 2013-05-09: qty 1

## 2013-05-09 SURGICAL SUPPLY — 67 items
BALL CTTN LRG ABS STRL LF (GAUZE/BANDAGES/DRESSINGS) ×3
BLADE MVR KNIFE 20G (BLADE) ×2 IMPLANT
CANNULA FLEX TIP 25G (CANNULA) ×2 IMPLANT
CANNULA SUBRETINAL FLUID 20G (BLADE) ×2 IMPLANT
CANNULA VLV SOFT TIP 25G (OPHTHALMIC) ×1 IMPLANT
CANNULA VLV SOFT TIP 25GA (OPHTHALMIC) ×6 IMPLANT
CLOTH BEACON ORANGE TIMEOUT ST (SAFETY) ×2 IMPLANT
CORDS BIPOLAR (ELECTRODE) ×2 IMPLANT
COTTONBALL LRG STERILE PKG (GAUZE/BANDAGES/DRESSINGS) ×6 IMPLANT
DRAPE INCISE 51X51 W/FILM STRL (DRAPES) ×1 IMPLANT
DRAPE OPHTHALMIC 77X100 STRL (CUSTOM PROCEDURE TRAY) ×2 IMPLANT
ERASER HMR WETFIELD 23G BP (MISCELLANEOUS) IMPLANT
FILTER BLUE MILLIPORE (MISCELLANEOUS) ×2 IMPLANT
FILTER STRAW FLUID ASPIR (MISCELLANEOUS) ×2 IMPLANT
FORCEPS GRIESHABER ILM 25G A (INSTRUMENTS) IMPLANT
GAS AUTO FILL CONSTEL (OPHTHALMIC) ×2
GAS AUTO FILL CONSTELLATION (OPHTHALMIC) IMPLANT
GAS OPHTHALMIC (MISCELLANEOUS) ×1 IMPLANT
GLOVE SS BIOGEL STRL SZ 6.5 (GLOVE) ×1 IMPLANT
GLOVE SS BIOGEL STRL SZ 7 (GLOVE) ×1 IMPLANT
GLOVE SUPERSENSE BIOGEL SZ 6.5 (GLOVE) ×1
GLOVE SUPERSENSE BIOGEL SZ 7 (GLOVE) ×1
GLOVE SURG 8.5 LATEX PF (GLOVE) ×2 IMPLANT
HANDLE PNEUMATIC FOR CONSTEL (OPHTHALMIC) IMPLANT
KIT BASIN OR (CUSTOM PROCEDURE TRAY) ×2 IMPLANT
KIT ROOM TURNOVER OR (KITS) ×2 IMPLANT
KNIFE CRESCENT 1.75 EDGEAHEAD (BLADE) IMPLANT
KNIFE GRIESHABER SHARP 2.5MM (MISCELLANEOUS) IMPLANT
MASK EYE SHIELD (GAUZE/BANDAGES/DRESSINGS) ×1 IMPLANT
MICROPICK 25G (MISCELLANEOUS)
NDL 18GX1X1/2 (RX/OR ONLY) (NEEDLE) ×1 IMPLANT
NDL 25GX 5/8IN NON SAFETY (NEEDLE) ×1 IMPLANT
NDL HYPO 30X.5 LL (NEEDLE) ×3 IMPLANT
NEEDLE 18GX1X1/2 (RX/OR ONLY) (NEEDLE) ×2 IMPLANT
NEEDLE 25GX 5/8IN NON SAFETY (NEEDLE) ×2 IMPLANT
NEEDLE 27GAX1X1/2 (NEEDLE) IMPLANT
NEEDLE HYPO 30X.5 LL (NEEDLE) ×6 IMPLANT
NS IRRIG 1000ML POUR BTL (IV SOLUTION) ×2 IMPLANT
PACK FRAGMATOME (OPHTHALMIC) ×2 IMPLANT
PACK VITRECTOMY CUSTOM (CUSTOM PROCEDURE TRAY) ×2 IMPLANT
PAD ARMBOARD 7.5X6 YLW CONV (MISCELLANEOUS) ×4 IMPLANT
PAD EYE OVAL STERILE LF (GAUZE/BANDAGES/DRESSINGS) ×1 IMPLANT
PAK PIK VITRECTOMY CVS 25GA (OPHTHALMIC) ×2 IMPLANT
PIC ILLUMINATED 25G (OPHTHALMIC) ×2
PICK MICROPICK 25G (MISCELLANEOUS) IMPLANT
PIK ILLUMINATED 25G (OPHTHALMIC) ×1 IMPLANT
PROBE LASER ILLUM FLEX CVD 25G (OPHTHALMIC) IMPLANT
REPL STRA BRUSH NDL (NEEDLE) ×1 IMPLANT
REPL STRA BRUSH NEEDLE (NEEDLE) ×2 IMPLANT
RESERVOIR BACK FLUSH (MISCELLANEOUS) ×2 IMPLANT
ROLLS DENTAL (MISCELLANEOUS) ×4 IMPLANT
SCRAPER DIAMOND 25GA (OPHTHALMIC RELATED) IMPLANT
SCRAPER DIAMOND DUST MEMBRANE (MISCELLANEOUS) ×2 IMPLANT
SPONGE SURGIFOAM ABS GEL 12-7 (HEMOSTASIS) ×2 IMPLANT
STOPCOCK 4 WAY LG BORE MALE ST (IV SETS) ×2 IMPLANT
SUT ETHILON 9 0 TG140 8 (SUTURE) ×2 IMPLANT
SUT SILK 4 0 RB 1 (SUTURE) IMPLANT
SYR 20CC LL (SYRINGE) ×2 IMPLANT
SYR 50ML LL SCALE MARK (SYRINGE) ×2 IMPLANT
SYR BULB 3OZ (MISCELLANEOUS) ×2 IMPLANT
SYR TB 1ML LUER SLIP (SYRINGE) ×2 IMPLANT
SYRINGE 10CC LL (SYRINGE) ×2 IMPLANT
TAPE SURG TRANSPORE 1 IN (GAUZE/BANDAGES/DRESSINGS) IMPLANT
TAPE SURGICAL TRANSPORE 1 IN (GAUZE/BANDAGES/DRESSINGS) ×1
TOWEL OR 17X24 6PK STRL BLUE (TOWEL DISPOSABLE) ×6 IMPLANT
WATER STERILE IRR 1000ML POUR (IV SOLUTION) ×2 IMPLANT
WIPE INSTRUMENT VISIWIPE 73X73 (MISCELLANEOUS) ×2 IMPLANT

## 2013-05-09 NOTE — Progress Notes (Signed)
Patient does not want to take Morphine IV for pain and says she is allergic to Vicodin-says it makes her hallucinate.  She says she has taken Percocet in the past without a problem.  Dr. Zigmund Daniel notified.

## 2013-05-09 NOTE — Brief Op Note (Signed)
Brief Operative note   Preoperative diagnosis:  Pre-Op Diagnosis Codes:    * Macular cyst, hole, or pseudohole of retina [362.54] Postoperative diagnosis  Post-Op Diagnosis Codes:    * Macular cyst, hole, or pseudohole of retina [362.54]  Procedures: Repair macular hole with vitrectomy, membrane peel, serum patch, laser.  Right eye  Surgeon:  Hayden Pedro, MD...  Assistant:  Deatra Ina SA    Anesthesia: General  Specimen: none  Estimated blood loss:  1cc  Complications: none  Patient sent to PACU in good condition  Composed by Hayden Pedro MD  Dictation number: [id number]

## 2013-05-09 NOTE — H&P (Signed)
I examined the patient today and there is no change in the medical status 

## 2013-05-09 NOTE — Preoperative (Signed)
Beta Blockers   Reason not to administer Beta Blockers:Coreg this morning

## 2013-05-09 NOTE — Transfer of Care (Signed)
Immediate Anesthesia Transfer of Care Note  Patient: [name]  Procedure(s) Performed: Procedure(s) with comments: 25 GAUGE PARS PLANA VITRECTOMY WITH 20 GAUGE MVR PORT FOR MACULAR HOLE (Right) GAS/FLUID EXCHANGE (Right) - C3F8  SERUM PATCH (Right) LASER PHOTO ABLATION (Right) - Endolaser  MEMBRANE PEEL (Right)  Patient Location: PACU  Anesthesia Type:General  Level of Consciousness: awake, sedated and patient cooperative  Airway & Oxygen Therapy: Patient Spontanous Breathing and Patient connected to nasal cannula oxygen  Post-op Assessment: Report given to PACU RN and Post -op Vital signs reviewed and stable  Post vital signs: Reviewed and stable  Complications: No apparent anesthesia complications

## 2013-05-09 NOTE — Anesthesia Preprocedure Evaluation (Addendum)
Anesthesia Evaluation  Patient identified by MRN, date of birth, ID band Patient awake    Reviewed: Allergy & Precautions, H&P , NPO status , Patient's Chart, lab work & pertinent test results, reviewed documented beta blocker date and time   Airway Mallampati: II TM Distance: >3 FB Neck ROM: full    Dental  (+) Edentulous Upper, Edentulous Lower and Dental Advisory Given   Pulmonary          Cardiovascular hypertension, Pt. on home beta blockers + CAD, + Peripheral Vascular Disease and +CHF + dysrhythmias + pacemaker + Cardiac Defibrillator     Neuro/Psych Anxiety Depression    GI/Hepatic   Endo/Other  diabetes, Well Controlled, Type 2  Renal/GU Renal InsufficiencyRenal disease     Musculoskeletal   Abdominal   Peds  Hematology   Anesthesia Other Findings   Reproductive/Obstetrics                         Anesthesia Physical Anesthesia Plan  ASA: IV  Anesthesia Plan: General   Post-op Pain Management:    Induction: Intravenous  Airway Management Planned: Oral ETT  Additional Equipment:   Intra-op Plan:   Post-operative Plan: Extubation in OR  Informed Consent: I have reviewed the patients History and Physical, chart, labs and discussed the procedure including the risks, benefits and alternatives for the proposed anesthesia with the patient or authorized representative who has indicated his/her understanding and acceptance.     Plan Discussed with: CRNA, Anesthesiologist and Surgeon  Anesthesia Plan Comments:         Anesthesia Quick Evaluation

## 2013-05-09 NOTE — Anesthesia Procedure Notes (Addendum)
Procedure Name: Intubation Date/Time: 05/09/2013 1:49 PM Performed by: Williemae Area B Pre-anesthesia Checklist: Patient identified, Emergency Drugs available, Suction available and Patient being monitored Patient Re-evaluated:Patient Re-evaluated prior to inductionOxygen Delivery Method: Circle system utilized Preoxygenation: Pre-oxygenation with 100% oxygen Intubation Type: IV induction Ventilation: Mask ventilation without difficulty and Oral airway inserted - appropriate to patient size Laryngoscope Size: Mac and 4 Grade View: Grade II Tube type: Oral Tube size: 7.5 mm Number of attempts: 1 Airway Equipment and Method: Stylet Placement Confirmation: ETT inserted through vocal cords under direct vision,  breath sounds checked- equal and bilateral and positive ETCO2 Secured at: 21 (cm at gums) cm Tube secured with: Tape Dental Injury: Teeth and Oropharynx as per pre-operative assessment

## 2013-05-10 LAB — GLUCOSE, CAPILLARY
Glucose-Capillary: 202 mg/dL — ABNORMAL HIGH (ref 70–99)
Glucose-Capillary: 244 mg/dL — ABNORMAL HIGH (ref 70–99)

## 2013-05-10 MED ORDER — GATIFLOXACIN 0.5 % OP SOLN: 1.0000 [drp] | Freq: Four times a day (QID) | OPHTHALMIC | Status: DC

## 2013-05-10 MED ORDER — BACITRACIN-POLYMYXIN B 500-10000 UNIT/GM OP OINT: 1.0000 "application " | TOPICAL_OINTMENT | Freq: Four times a day (QID) | OPHTHALMIC | Status: DC

## 2013-05-10 MED ORDER — PREDNISOLONE ACETATE 1 % OP SUSP: 1.0000 [drp] | Freq: Four times a day (QID) | OPHTHALMIC | Status: DC

## 2013-05-10 NOTE — Discharge Summary (Signed)
Discharge summary not needed on [facility] patients per medical records. 

## 2013-05-10 NOTE — Op Note (Signed)
[name], [name]            ACCOUNT NO.:  [id number]  MEDICAL RECORD NO.:  [id number]  LOCATION:  [id number]                        FACILITY:  [facility]  PHYSICIAN:  Chrystie Nose. Zigmund Daniel, M.D. DATE OF BIRTH:  1946-01-04  DATE OF PROCEDURE:  05/09/2013 DATE OF DISCHARGE:                              OPERATIVE REPORT   ADMISSION DIAGNOSES:  Recurrent macular hole, right eye.  Proliferative diabetic retinopathy, right eye.  PROCEDURES:  Pars plana vitrectomy, right eye; retinal photocoagulation, right eye; membrane peel, right eye; serum patch, gas fluid exchange, all in the right eye.  SURGEON:  Chrystie Nose. Zigmund Daniel, M.D.  ASSISTANT:  Deatra Ina, SA.  ANESTHESIA:  General.  DETAILS:  After usual prep and drape, 25-gauge trocars placed at 8 and 10 o'clock respectively.  A 20-gauge opening and the 3 layered scleral opening at 3 at 2 o'clock.  Contact lens ring anchored into place at 6 and 12 o'clock.  Provisc placed on the cornea and the flat contact lens placed on the eye.  Pars plana vitrectomy was begun just behind the crystalline lens.  The vitrectomy was carried posteriorly, where some membranes were encountered.  The macular hole became apparent and the loop scraper and the diamond-dusted membrane scraper, we used to engage the internal limiting membrane and surface membranes which had grown to the edge of the hole.  These membranes were teased toward the center of the hole and the ILM was removed for approximately a disk diameter around the hole.  The 30 degree prismatic lens was then used to inspect the periphery and the endolaser was positioned in the eye.  A 321 burns were placed around the retinal periphery.  The power was 400 mW 1000 microns each and 0.1 seconds each.  A gas fluid exchange was carried out at this point.  Sufficient time was allowed for additional fluid to track down the walls of the eye and collect in the posterior segment. Additional fluid was removed at  this point.  A serum patch was prepared and placed on the macular hole.  C3F8 and a 16% concentration was prepared.  This was exchanged for intravitreal gas.  The instruments were removed from the eye.  The sclerotomy was closed with one 9-0 nylon suture.  The conjunctiva was closed with wet-field cautery.  The 25- gauge trocars were removed.  A C3F8 16% had been exchanged for intravitreal gas through direct injection.  Polymyxin and gentamicin were irrigated into tenon space. Atropine solution was applied. Marcaine was injected around the globe for postop pain.  Decadron 10 mg was injected into the lower subconjunctival space.  Closing pressure was 10 with a Baer care tonometer.  COMPLICATIONS:  None.  DURATION:  1 hour 30 minutes.  Polysporin patch and shield were placed. The patient was awakened and taken to recovery in satisfactory condition.     Chrystie Nose. Zigmund Daniel, M.D.     JDM/MEDQ  D:  05/09/2013  T:  05/10/2013  Job:  [id number]

## 2013-05-10 NOTE — Anesthesia Postprocedure Evaluation (Signed)
  Anesthesia Post-op Note  Patient: [name]  Procedure(s) Performed: Procedure(s) with comments: 25 GAUGE PARS PLANA VITRECTOMY WITH 20 GAUGE MVR PORT FOR MACULAR HOLE (Right) GAS/FLUID EXCHANGE (Right) - C3F8  SERUM PATCH (Right) LASER PHOTO ABLATION (Right) - Endolaser  MEMBRANE PEEL (Right)  Patient Location: Pt discharged to home

## 2013-05-10 NOTE — Progress Notes (Signed)
05/10/2013, 6:29 AM  Mental Status:  Awake, Alert, Oriented  Anterior segment: Cornea  Clear    Anterior Chamber Clear    Lens:    IOL  Intra Ocular Pressure 15 mmHg with Tonopen  Vitreous: Clear 95%gas bubble  Retina:  Attached Good laser reaction   Impression: Excellent result Retina attached  Final Diagnosis: Active Problems: Recurrent macular hole right eye   Plan: start post operative eye drops.  Discharge to home.  Give post operative instructions  [name] 05/10/2013, 6:29 AM

## 2013-05-11 ENCOUNTER — Encounter (HOSPITAL_COMMUNITY): Payer: Self-pay | Admitting: Ophthalmology

## 2013-05-15 ENCOUNTER — Encounter (INDEPENDENT_AMBULATORY_CARE_PROVIDER_SITE_OTHER): Payer: Medicare Other | Admitting: Ophthalmology

## 2013-05-15 DIAGNOSIS — H35349 Macular cyst, hole, or pseudohole, unspecified eye: Secondary | ICD-10-CM

## 2013-05-23 ENCOUNTER — Encounter: Payer: Self-pay | Admitting: Internal Medicine

## 2013-05-23 ENCOUNTER — Ambulatory Visit (INDEPENDENT_AMBULATORY_CARE_PROVIDER_SITE_OTHER): Payer: Medicare Other | Admitting: *Deleted

## 2013-05-23 DIAGNOSIS — I5022 Chronic systolic (congestive) heart failure: Secondary | ICD-10-CM

## 2013-05-23 DIAGNOSIS — I429 Cardiomyopathy, unspecified: Secondary | ICD-10-CM

## 2013-05-23 LAB — ICD DEVICE OBSERVATION
AL AMPLITUDE: 3.25 mv
AL THRESHOLD: 0.75 V
BATTERY VOLTAGE: 3.0584 V
LV LEAD IMPEDENCE ICD: 380 Ohm
RV LEAD IMPEDENCE ICD: 437 Ohm
RV LEAD THRESHOLD: 0.75 V
TOT-0002: 0
TOT-0006: 20111228000000
TZAT-0001ATACH: 1
TZAT-0001ATACH: 2
TZAT-0001FASTVT: 1
TZAT-0001SLOWVT: 1
TZAT-0002FASTVT: NEGATIVE
TZAT-0005SLOWVT: 88 pct
TZAT-0012ATACH: 150 ms
TZAT-0012ATACH: 150 ms
TZAT-0012ATACH: 150 ms
TZAT-0013SLOWVT: 2
TZAT-0018ATACH: NEGATIVE
TZAT-0018ATACH: NEGATIVE
TZAT-0018FASTVT: NEGATIVE
TZAT-0018SLOWVT: NEGATIVE
TZAT-0019FASTVT: 8 V
TZAT-0019SLOWVT: 8 V
TZAT-0020ATACH: 1.5 ms
TZAT-0020ATACH: 1.5 ms
TZAT-0020ATACH: 1.5 ms
TZAT-0020SLOWVT: 1.5 ms
TZON-0003ATACH: 350 ms
TZON-0003VSLOWVT: 400 ms
TZON-0004SLOWVT: 32
TZON-0005SLOWVT: 12
TZST-0001ATACH: 4
TZST-0001FASTVT: 3
TZST-0001FASTVT: 4
TZST-0001FASTVT: 5
TZST-0001SLOWVT: 2
TZST-0001SLOWVT: 4
TZST-0001SLOWVT: 5
TZST-0001SLOWVT: 6
TZST-0002ATACH: NEGATIVE
TZST-0002FASTVT: NEGATIVE
TZST-0002FASTVT: NEGATIVE
TZST-0003SLOWVT: 35 J
TZST-0003SLOWVT: 35 J
TZST-0003SLOWVT: 35 J
VF: 0

## 2013-05-23 NOTE — Progress Notes (Signed)
ICD check with ICM in office.

## 2013-06-07 ENCOUNTER — Encounter (INDEPENDENT_AMBULATORY_CARE_PROVIDER_SITE_OTHER): Payer: Medicare Other | Admitting: Ophthalmology

## 2013-06-07 DIAGNOSIS — H35349 Macular cyst, hole, or pseudohole, unspecified eye: Secondary | ICD-10-CM

## 2013-06-12 ENCOUNTER — Ambulatory Visit (INDEPENDENT_AMBULATORY_CARE_PROVIDER_SITE_OTHER): Payer: Medicare Other | Admitting: Cardiology

## 2013-06-12 ENCOUNTER — Encounter: Payer: Self-pay | Admitting: Cardiology

## 2013-06-12 VITALS — BP 152/69 | HR 58 | Ht 61.0 in | Wt 160.8 lb

## 2013-06-12 DIAGNOSIS — I10 Essential (primary) hypertension: Secondary | ICD-10-CM

## 2013-06-12 DIAGNOSIS — I5022 Chronic systolic (congestive) heart failure: Secondary | ICD-10-CM

## 2013-06-12 MED ORDER — AMLODIPINE BESYLATE 10 MG PO TABS: 10.0000 mg | ORAL_TABLET | Freq: Two times a day (BID) | ORAL | Status: DC

## 2013-06-12 NOTE — Assessment & Plan Note (Signed)
Continue current regimen. Weight is stable as are symptoms. Follow [facility] for next visit in 3 months.

## 2013-06-12 NOTE — Progress Notes (Signed)
Clinical Summary [name] is a 67 y.o.female last seen in July. She reports no changes in dyspnea on exertion, no chest pain symptoms.  Lab work from August showed potassium 4.1, BUN 45, creatinine 1.7, sodium 138. Weight is down 1-2 pounds since last visit.  ICD check noted in August. No ventricular arrhythmias, biventricular pacing 100% of the time. Optivoll was abnormal through June.   Allergies  Allergen Reactions  . Hydrocodone-Acetaminophen Other (See Comments)    hallucinations    Current Outpatient Prescriptions  Medication Sig Dispense Refill  . amLODipine (NORVASC) 10 MG tablet Take 10 mg by mouth 2 (two) times daily.      Marland Kitchen aspirin 81 MG tablet Take 81 mg by mouth daily.       . bacitracin-polymyxin b (POLYSPORIN) ophthalmic ointment Place 1 application into the right eye 4 (four) times daily. apply to eye every 12 hours while awake  3.5 g  0  . carvedilol (COREG) 25 MG tablet Take 25 mg by mouth 2 (two) times daily with a meal.      . digoxin (LANOXIN) 0.125 MG tablet Take 1 tablet (0.125 mg total) by mouth daily.  90 tablet  1  . docusate sodium (COLACE) 100 MG capsule Take 100 mg by mouth daily as needed. For constipation      . furosemide (LASIX) 40 MG tablet Take 40 mg by mouth daily.      Marland Kitchen gatifloxacin (ZYMAXID) 0.5 % SOLN Place 1 drop into the right eye 4 (four) times daily.      . hydrALAZINE (APRESOLINE) 100 MG tablet Take 1 tablet (100 mg total) by mouth 3 (three) times daily.  90 tablet  3  . insulin glargine (LANTUS) 100 UNIT/ML injection Inject 42 Units into the skin daily.       . insulin lispro (HUMALOG) 100 UNIT/ML injection Inject 0-8 Units into the skin every evening. Sliding scale: 200-249=2 units, 250-299=4 units, 300-349=6 units, >350=8 units      . losartan (COZAAR) 50 MG tablet Take 1 tablet (50 mg total) by mouth 2 (two) times daily.  90 tablet  1  . nitroGLYCERIN (NITROSTAT) 0.4 MG SL tablet Place 0.4 mg under the tongue every 5 (five) minutes  as needed. chest pain      . prednisoLONE acetate (PRED FORTE) 1 % ophthalmic suspension Place 1 drop into the right eye 4 (four) times daily.  5 mL  0   No current facility-administered medications for this visit.    Past Medical History  Diagnosis Date  . Diabetes mellitus, type 2   . Essential hypertension, benign   . Chronic systolic heart failure   . Coronary atherosclerosis of native coronary artery     Nonobstructive  . Nonischemic cardiomyopathy     LVEF 20%  . PAD (peripheral artery disease)     Left foot gangrene  . Anxiety   . Depression   . ICD (implantable cardiac defibrillator) in place     Social History [name] reports that she has never smoked. She has never used smokeless tobacco. [name] reports that she does not drink alcohol.  Review of Systems No palpitations or device discharges. No claudication. Has had eye surgery. Otherwise negative.  Physical Examination Filed Vitals:   06/12/13 0832  BP: 152/69  Pulse: 58   Filed Weights   06/12/13 0832  Weight: 160 lb 12 oz (72.916 kg)    Overweight woman, comfortable at rest.  HEENT: Conjunctiva and lids normal, oropharynx  clear.  Neck: Increased JVP, no carotid bruis, no thyromegaly.  Lungs: Clear with diminished breath sounds, nonlabored.  Cardiac: Indistinct PMI, regular rate and rhythm, 2/6 systolic murmur at the base, paradoxically split S2, no S3.   Extremities: Trace to 1+ edema, left greater than right, diminished distal pulses..  Skin: Warm and dry.   Problem List and Plan   Chronic systolic heart failure Continue current regimen. Weight is stable as are symptoms. Follow [facility] for next visit in 3 months.  Essential hypertension, benign Plan to continue current regimen. Optimal control has been difficult over time. We continue to discuss diet and sodium restriction.    Satira Sark, M.D., F.A.C.C.

## 2013-06-12 NOTE — Patient Instructions (Addendum)
Your physician recommends that you schedule a follow-up appointment in: [place] physician recommends that you return for lab work in: [facility] (Your physician recommends that you have follow up lab work, we will mail you a reminder letter to alert you when to go [employer], located across the street from our office.)

## 2013-06-12 NOTE — Assessment & Plan Note (Signed)
Plan to continue current regimen. Optimal control has been difficult over time. We continue to discuss diet and sodium restriction.

## 2013-06-12 NOTE — Addendum Note (Signed)
Addended by: Shara Blazing A on: 06/12/2013 09:58 AM   Modules accepted: Orders

## 2013-07-04 ENCOUNTER — Telehealth: Payer: Self-pay | Admitting: *Deleted

## 2013-07-04 NOTE — Telephone Encounter (Signed)
Pt states that her Rx for hydrolazine was changed as far as how much she is taking but a new rx was never called in. She uses [facility] in [place].

## 2013-07-05 ENCOUNTER — Telehealth: Payer: Self-pay | Admitting: *Deleted

## 2013-07-05 MED ORDER — HYDRALAZINE HCL 100 MG PO TABS: 100.0000 mg | ORAL_TABLET | Freq: Three times a day (TID) | ORAL | Status: DC

## 2013-07-05 MED ORDER — CARVEDILOL 25 MG PO TABS: 25.0000 mg | ORAL_TABLET | Freq: Two times a day (BID) | ORAL | Status: DC

## 2013-07-05 NOTE — Telephone Encounter (Signed)
.  left message to have patient return my call. rx sent to pharmacy by e-script for carvedilol

## 2013-07-05 NOTE — Telephone Encounter (Signed)
Pt called back to advise she was informed on 03-27-13, noted pt was advised to take 100mg  TID however pt notes her last pill was 50mg  therefore pt has doubled per did not realize she had been prescribed 100mg  tablets instead of 50mg , please advise any further instructions

## 2013-07-05 NOTE — Telephone Encounter (Signed)
Pt called into advise that she needs to have a refill called in for her hydralazine per pt states that during her pacer check she was advised per elevated BP to increase her hydralazine from 1 (100mg ) tablet TID to 2 (100mg ) tablets TID,therefore pt is calling for a refill, unable to locate notations that pt was advised this during her 05-23-13 pacer check, advised we will inform Dr. [name] to make sure this is ok for her to increase, pt notes she increased on 05-24-13 and notes BP readings better in ranges from 119/60's and no side effects noted, Pt made aware that the provider is currently providing care for other patients in office/hospital setting/out of office/or away from their desk. As soon as the provider responds to your concern/question/results, a member of our staff will contact you as soon as time permits. If the patient has not heard a response by the end of the business day (5pm) they will be contacted the following business day. Pt understood. PLEASE ADVISE

## 2013-07-05 NOTE — Telephone Encounter (Signed)
Spoke to pt to advise results/instructions. Pt understood. Pt understood and will NO LONGER take the increased dosage per pt mistake and took the 100mg  tablets as if this was her prior dosage of 50mg  tablets, advised that her 100mg  will be called in today and to ONLY take 1 pill three times daily NOT TWO, pt understood and will call office back with any further concerns

## 2013-07-05 NOTE — Telephone Encounter (Signed)
The maximum dose is 300 mg in 24 hours, so should not be taking more than 100 mg 3 times a day.

## 2013-07-27 ENCOUNTER — Telehealth: Payer: Self-pay | Admitting: Adult Health

## 2013-07-27 NOTE — Telephone Encounter (Signed)
Received fax refill request  Rx # [id number] Medication:  Losartan 50mg  Qty 90 Sig:  Take one tablet by mouth twice daily Physician:  Purcell Nails

## 2013-07-28 MED ORDER — LOSARTAN POTASSIUM 50 MG PO TABS: 50.0000 mg | ORAL_TABLET | Freq: Two times a day (BID) | ORAL | Status: DC

## 2013-07-28 NOTE — Telephone Encounter (Signed)
rx sent to pharmacy by e-script  

## 2013-08-01 ENCOUNTER — Telehealth: Payer: Self-pay

## 2013-08-01 NOTE — Telephone Encounter (Signed)
Recvd fax from [facility] regarding therapeutic alert.  Heart Failure without aldosterone antagonist.  Fax in nurses box for reference.

## 2013-08-01 NOTE — Telephone Encounter (Signed)
Alert placed on provider desk for review and next steps

## 2013-08-11 ENCOUNTER — Ambulatory Visit (INDEPENDENT_AMBULATORY_CARE_PROVIDER_SITE_OTHER): Payer: Medicare Other | Admitting: *Deleted

## 2013-08-11 DIAGNOSIS — I429 Cardiomyopathy, unspecified: Secondary | ICD-10-CM

## 2013-08-11 DIAGNOSIS — I5022 Chronic systolic (congestive) heart failure: Secondary | ICD-10-CM

## 2013-08-11 NOTE — Progress Notes (Signed)
ICD check with ICM 

## 2013-08-16 ENCOUNTER — Encounter (INDEPENDENT_AMBULATORY_CARE_PROVIDER_SITE_OTHER): Payer: Medicare Other | Admitting: Ophthalmology

## 2013-08-16 DIAGNOSIS — E1139 Type 2 diabetes mellitus with other diabetic ophthalmic complication: Secondary | ICD-10-CM

## 2013-08-16 DIAGNOSIS — I10 Essential (primary) hypertension: Secondary | ICD-10-CM

## 2013-08-16 DIAGNOSIS — H35349 Macular cyst, hole, or pseudohole, unspecified eye: Secondary | ICD-10-CM

## 2013-08-16 DIAGNOSIS — H43819 Vitreous degeneration, unspecified eye: Secondary | ICD-10-CM

## 2013-08-16 DIAGNOSIS — E11359 Type 2 diabetes mellitus with proliferative diabetic retinopathy without macular edema: Secondary | ICD-10-CM

## 2013-08-16 DIAGNOSIS — H35039 Hypertensive retinopathy, unspecified eye: Secondary | ICD-10-CM

## 2013-08-21 ENCOUNTER — Encounter: Payer: Self-pay | Admitting: *Deleted

## 2013-08-21 ENCOUNTER — Encounter: Payer: Self-pay | Admitting: Internal Medicine

## 2013-08-21 ENCOUNTER — Other Ambulatory Visit: Payer: Self-pay | Admitting: *Deleted

## 2013-08-21 DIAGNOSIS — I5022 Chronic systolic (congestive) heart failure: Secondary | ICD-10-CM

## 2013-08-21 DIAGNOSIS — I10 Essential (primary) hypertension: Secondary | ICD-10-CM

## 2013-09-04 ENCOUNTER — Encounter: Payer: Self-pay | Admitting: Cardiology

## 2013-09-11 ENCOUNTER — Ambulatory Visit: Payer: Medicare Other | Admitting: Cardiology

## 2013-09-19 ENCOUNTER — Ambulatory Visit (INDEPENDENT_AMBULATORY_CARE_PROVIDER_SITE_OTHER): Payer: Medicare Other | Admitting: Cardiology

## 2013-09-19 ENCOUNTER — Encounter: Payer: Self-pay | Admitting: Cardiology

## 2013-09-19 VITALS — BP 142/52 | HR 58 | Ht 61.0 in | Wt 158.0 lb

## 2013-09-19 DIAGNOSIS — I10 Essential (primary) hypertension: Secondary | ICD-10-CM

## 2013-09-19 DIAGNOSIS — I5022 Chronic systolic (congestive) heart failure: Secondary | ICD-10-CM

## 2013-09-19 DIAGNOSIS — Z95810 Presence of automatic (implantable) cardiac defibrillator: Secondary | ICD-10-CM

## 2013-09-19 NOTE — Patient Instructions (Signed)
Your physician recommends that you schedule a follow-up appointment in: 3 months. Your physician recommends that you continue on your current medications as directed. Please refer to the Current Medication list given to you today. 

## 2013-09-19 NOTE — Assessment & Plan Note (Signed)
Nonischemic cardiomyopathy with LVEF 20%. Clinically stable with reasonable volume status. No change to current diuretic regimen. Followup in the next 3 months.

## 2013-09-19 NOTE — Progress Notes (Signed)
Clinical Summary [name] is a 67 y.o.female last seen in September. She has been clinically stable in terms of shortness of breath, no chest pain or palpitations. Weight is down 2 pounds.  Lab work from November showed normal LFTs, hemoglobin A1c 8.5, sodium 139, potassium 4.3, BUN 34, creatinine 1.3, cholesterol 183, triglycerides 70, HDL 51, LDL 118.   Allergies  Allergen Reactions  . Hydrocodone-Acetaminophen Other (See Comments)    hallucinations    Current Outpatient Prescriptions  Medication Sig Dispense Refill  . amLODipine (NORVASC) 10 MG tablet Take 1 tablet (10 mg total) by mouth 2 (two) times daily.  60 tablet  6  . aspirin 81 MG tablet Take 81 mg by mouth daily.       . carvedilol (COREG) 25 MG tablet Take 1 tablet (25 mg total) by mouth 2 (two) times daily with a meal.  60 tablet  6  . digoxin (LANOXIN) 0.125 MG tablet Take 1 tablet (0.125 mg total) by mouth daily.  90 tablet  1  . docusate sodium (COLACE) 100 MG capsule Take 100 mg by mouth daily as needed. For constipation      . furosemide (LASIX) 40 MG tablet Take 40 mg by mouth daily.      . hydrALAZINE (APRESOLINE) 100 MG tablet Take 1 tablet (100 mg total) by mouth 3 (three) times daily.  90 tablet  5  . insulin glargine (LANTUS) 100 UNIT/ML injection Inject 45 Units into the skin daily.       . insulin lispro (HUMALOG) 100 UNIT/ML injection Inject 0-8 Units into the skin every evening. Sliding scale: 200-249=2 units, 250-299=4 units, 300-349=6 units, >350=8 units      . losartan (COZAAR) 50 MG tablet Take 1 tablet (50 mg total) by mouth 2 (two) times daily.  90 tablet  1  . nitroGLYCERIN (NITROSTAT) 0.4 MG SL tablet Place 0.4 mg under the tongue every 5 (five) minutes as needed. chest pain       No current facility-administered medications for this visit.    Past Medical History  Diagnosis Date  . Diabetes mellitus, type 2   . Essential hypertension, benign   . Chronic systolic heart failure   .  Coronary atherosclerosis of native coronary artery     Nonobstructive  . Nonischemic cardiomyopathy     LVEF 20%  . PAD (peripheral artery disease)     Left foot gangrene  . Anxiety   . Depression   . ICD (implantable cardiac defibrillator) in place     Social History [name] reports that she has never smoked. She has never used smokeless tobacco. [name] reports that she does not drink alcohol.  Review of Systems Occasional leg edema. Leg cramps as well. Otherwise negative.  Physical Examination Filed Vitals:   09/19/13 1129  BP: 142/52  Pulse: 58   Filed Weights   09/19/13 1129  Weight: 158 lb (71.668 kg)    Overweight woman, comfortable at rest.  HEENT: Conjunctiva and lids normal, oropharynx clear.  Neck: Increased JVP, no carotid bruis, no thyromegaly.  Lungs: Clear with diminished breath sounds, nonlabored.  Cardiac: Indistinct PMI, regular rate and rhythm, 2/6 systolic murmur at the base, paradoxically split S2, no S3. Extremities: Trace edema, left greater than right, diminished distal pulses..  Skin: Warm and dry.   Problem List and Plan   Chronic systolic heart failure Nonischemic cardiomyopathy with LVEF 20%. Clinically stable with reasonable volume status. No change to current diuretic regimen. Followup  in the next 3 months.  Biventricular implantable cardioverter-defibrillator in situ Keep followup in the device clinic.  Essential hypertension, benign Blood pressure trend is better. No change to current regimen.    Satira Sark, M.D., F.A.C.C.

## 2013-09-19 NOTE — Assessment & Plan Note (Signed)
Keep followup in the device clinic.

## 2013-09-19 NOTE — Assessment & Plan Note (Signed)
Blood pressure trend is better. No change to current regimen.

## 2013-10-05 ENCOUNTER — Other Ambulatory Visit: Payer: Self-pay | Admitting: Adult Health

## 2013-10-05 DIAGNOSIS — R7881 Bacteremia: Secondary | ICD-10-CM

## 2013-10-05 DIAGNOSIS — B9562 Methicillin resistant Staphylococcus aureus infection as the cause of diseases classified elsewhere: Secondary | ICD-10-CM

## 2013-10-05 HISTORY — DX: Bacteremia: R78.81

## 2013-10-05 HISTORY — DX: Methicillin resistant Staphylococcus aureus infection as the cause of diseases classified elsewhere: B95.62

## 2013-11-04 ENCOUNTER — Encounter (HOSPITAL_COMMUNITY): Payer: Self-pay | Admitting: Emergency Medicine

## 2013-11-04 ENCOUNTER — Emergency Department (HOSPITAL_COMMUNITY): Payer: Medicare Other

## 2013-11-04 ENCOUNTER — Inpatient Hospital Stay (HOSPITAL_COMMUNITY)
Admission: EM | Admit: 2013-11-04 | Discharge: 2013-11-20 | DRG: 853 | Disposition: A | Discharge status: Skilled Nursing Facility | Payer: Medicare Other | Attending: Internal Medicine | Admitting: Internal Medicine

## 2013-11-04 DIAGNOSIS — D649 Anemia, unspecified: Secondary | ICD-10-CM | POA: Diagnosis present

## 2013-11-04 DIAGNOSIS — I2489 Other forms of acute ischemic heart disease: Secondary | ICD-10-CM | POA: Diagnosis present

## 2013-11-04 DIAGNOSIS — L98499 Non-pressure chronic ulcer of skin of other sites with unspecified severity: Secondary | ICD-10-CM | POA: Diagnosis present

## 2013-11-04 DIAGNOSIS — I339 Acute and subacute endocarditis, unspecified: Secondary | ICD-10-CM

## 2013-11-04 DIAGNOSIS — Z794 Long term (current) use of insulin: Secondary | ICD-10-CM

## 2013-11-04 DIAGNOSIS — I10 Essential (primary) hypertension: Secondary | ICD-10-CM

## 2013-11-04 DIAGNOSIS — I2690 Septic pulmonary embolism without acute cor pulmonale: Secondary | ICD-10-CM | POA: Diagnosis present

## 2013-11-04 DIAGNOSIS — R6521 Severe sepsis with septic shock: Secondary | ICD-10-CM

## 2013-11-04 DIAGNOSIS — J95821 Acute postprocedural respiratory failure: Secondary | ICD-10-CM | POA: Diagnosis not present

## 2013-11-04 DIAGNOSIS — I70219 Atherosclerosis of native arteries of extremities with intermittent claudication, unspecified extremity: Secondary | ICD-10-CM

## 2013-11-04 DIAGNOSIS — M1990 Unspecified osteoarthritis, unspecified site: Secondary | ICD-10-CM | POA: Diagnosis present

## 2013-11-04 DIAGNOSIS — Z6830 Body mass index (BMI) 30.0-30.9, adult: Secondary | ICD-10-CM

## 2013-11-04 DIAGNOSIS — I2510 Atherosclerotic heart disease of native coronary artery without angina pectoris: Secondary | ICD-10-CM | POA: Diagnosis present

## 2013-11-04 DIAGNOSIS — B9562 Methicillin resistant Staphylococcus aureus infection as the cause of diseases classified elsewhere: Secondary | ICD-10-CM

## 2013-11-04 DIAGNOSIS — E872 Acidosis, unspecified: Secondary | ICD-10-CM | POA: Diagnosis present

## 2013-11-04 DIAGNOSIS — R739 Hyperglycemia, unspecified: Secondary | ICD-10-CM | POA: Diagnosis present

## 2013-11-04 DIAGNOSIS — Z95810 Presence of automatic (implantable) cardiac defibrillator: Secondary | ICD-10-CM | POA: Diagnosis present

## 2013-11-04 DIAGNOSIS — N189 Chronic kidney disease, unspecified: Secondary | ICD-10-CM

## 2013-11-04 DIAGNOSIS — Z7982 Long term (current) use of aspirin: Secondary | ICD-10-CM

## 2013-11-04 DIAGNOSIS — N058 Unspecified nephritic syndrome with other morphologic changes: Secondary | ICD-10-CM | POA: Diagnosis present

## 2013-11-04 DIAGNOSIS — R6520 Severe sepsis without septic shock: Secondary | ICD-10-CM

## 2013-11-04 DIAGNOSIS — I428 Other cardiomyopathies: Secondary | ICD-10-CM | POA: Diagnosis present

## 2013-11-04 DIAGNOSIS — IMO0001 Reserved for inherently not codable concepts without codable children: Secondary | ICD-10-CM | POA: Diagnosis present

## 2013-11-04 DIAGNOSIS — R509 Fever, unspecified: Secondary | ICD-10-CM

## 2013-11-04 DIAGNOSIS — E1165 Type 2 diabetes mellitus with hyperglycemia: Secondary | ICD-10-CM | POA: Diagnosis present

## 2013-11-04 DIAGNOSIS — I248 Other forms of acute ischemic heart disease: Secondary | ICD-10-CM | POA: Diagnosis present

## 2013-11-04 DIAGNOSIS — T827XXA Infection and inflammatory reaction due to other cardiac and vascular devices, implants and grafts, initial encounter: Secondary | ICD-10-CM | POA: Diagnosis present

## 2013-11-04 DIAGNOSIS — I129 Hypertensive chronic kidney disease with stage 1 through stage 4 chronic kidney disease, or unspecified chronic kidney disease: Secondary | ICD-10-CM | POA: Diagnosis present

## 2013-11-04 DIAGNOSIS — E119 Type 2 diabetes mellitus without complications: Secondary | ICD-10-CM

## 2013-11-04 DIAGNOSIS — L97409 Non-pressure chronic ulcer of unspecified heel and midfoot with unspecified severity: Secondary | ICD-10-CM | POA: Diagnosis present

## 2013-11-04 DIAGNOSIS — I5022 Chronic systolic (congestive) heart failure: Secondary | ICD-10-CM | POA: Diagnosis present

## 2013-11-04 DIAGNOSIS — F3289 Other specified depressive episodes: Secondary | ICD-10-CM | POA: Diagnosis present

## 2013-11-04 DIAGNOSIS — E1129 Type 2 diabetes mellitus with other diabetic kidney complication: Secondary | ICD-10-CM | POA: Diagnosis present

## 2013-11-04 DIAGNOSIS — Y831 Surgical operation with implant of artificial internal device as the cause of abnormal reaction of the patient, or of later complication, without mention of misadventure at the time of the procedure: Secondary | ICD-10-CM | POA: Diagnosis present

## 2013-11-04 DIAGNOSIS — F329 Major depressive disorder, single episode, unspecified: Secondary | ICD-10-CM | POA: Diagnosis present

## 2013-11-04 DIAGNOSIS — E785 Hyperlipidemia, unspecified: Secondary | ICD-10-CM | POA: Diagnosis present

## 2013-11-04 DIAGNOSIS — I330 Acute and subacute infective endocarditis: Secondary | ICD-10-CM | POA: Diagnosis present

## 2013-11-04 DIAGNOSIS — L97529 Non-pressure chronic ulcer of other part of left foot with unspecified severity: Secondary | ICD-10-CM | POA: Diagnosis present

## 2013-11-04 DIAGNOSIS — IMO0002 Reserved for concepts with insufficient information to code with codable children: Secondary | ICD-10-CM | POA: Diagnosis present

## 2013-11-04 DIAGNOSIS — A419 Sepsis, unspecified organism: Secondary | ICD-10-CM | POA: Diagnosis present

## 2013-11-04 DIAGNOSIS — F411 Generalized anxiety disorder: Secondary | ICD-10-CM | POA: Diagnosis present

## 2013-11-04 DIAGNOSIS — E1169 Type 2 diabetes mellitus with other specified complication: Secondary | ICD-10-CM

## 2013-11-04 DIAGNOSIS — I739 Peripheral vascular disease, unspecified: Secondary | ICD-10-CM | POA: Diagnosis present

## 2013-11-04 DIAGNOSIS — J9601 Acute respiratory failure with hypoxia: Secondary | ICD-10-CM | POA: Diagnosis present

## 2013-11-04 DIAGNOSIS — N183 Chronic kidney disease, stage 3 unspecified: Secondary | ICD-10-CM | POA: Diagnosis present

## 2013-11-04 DIAGNOSIS — Z418 Encounter for other procedures for purposes other than remedying health state: Secondary | ICD-10-CM

## 2013-11-04 DIAGNOSIS — I509 Heart failure, unspecified: Secondary | ICD-10-CM | POA: Diagnosis present

## 2013-11-04 DIAGNOSIS — N170 Acute kidney failure with tubular necrosis: Secondary | ICD-10-CM | POA: Diagnosis not present

## 2013-11-04 DIAGNOSIS — R5381 Other malaise: Secondary | ICD-10-CM | POA: Diagnosis present

## 2013-11-04 DIAGNOSIS — Z8041 Family history of malignant neoplasm of ovary: Secondary | ICD-10-CM

## 2013-11-04 DIAGNOSIS — A4102 Sepsis due to Methicillin resistant Staphylococcus aureus: Principal | ICD-10-CM | POA: Diagnosis present

## 2013-11-04 DIAGNOSIS — R627 Adult failure to thrive: Secondary | ICD-10-CM | POA: Diagnosis present

## 2013-11-04 DIAGNOSIS — R7881 Bacteremia: Secondary | ICD-10-CM | POA: Diagnosis present

## 2013-11-04 DIAGNOSIS — E871 Hypo-osmolality and hyponatremia: Secondary | ICD-10-CM | POA: Diagnosis present

## 2013-11-04 DIAGNOSIS — J15212 Pneumonia due to Methicillin resistant Staphylococcus aureus: Secondary | ICD-10-CM | POA: Diagnosis not present

## 2013-11-04 DIAGNOSIS — Z2989 Encounter for other specified prophylactic measures: Secondary | ICD-10-CM

## 2013-11-04 DIAGNOSIS — K219 Gastro-esophageal reflux disease without esophagitis: Secondary | ICD-10-CM | POA: Diagnosis present

## 2013-11-04 DIAGNOSIS — J189 Pneumonia, unspecified organism: Secondary | ICD-10-CM

## 2013-11-04 DIAGNOSIS — N179 Acute kidney failure, unspecified: Secondary | ICD-10-CM

## 2013-11-04 DIAGNOSIS — Z79899 Other long term (current) drug therapy: Secondary | ICD-10-CM

## 2013-11-04 DIAGNOSIS — I82290 Acute embolism and thrombosis of other thoracic veins: Secondary | ICD-10-CM | POA: Diagnosis present

## 2013-11-04 DIAGNOSIS — Z885 Allergy status to narcotic agent status: Secondary | ICD-10-CM

## 2013-11-04 DIAGNOSIS — G9340 Encephalopathy, unspecified: Secondary | ICD-10-CM | POA: Diagnosis not present

## 2013-11-04 LAB — COMPREHENSIVE METABOLIC PANEL
ALBUMIN: 2.7 g/dL — AB (ref 3.5–5.2)
ALT: 12 U/L (ref 0–35)
AST: 27 U/L (ref 0–37)
Alkaline Phosphatase: 108 U/L (ref 39–117)
BILIRUBIN TOTAL: 0.4 mg/dL (ref 0.3–1.2)
BUN: 52 mg/dL — AB (ref 6–23)
CO2: 20 mEq/L (ref 19–32)
CREATININE: 2.08 mg/dL — AB (ref 0.50–1.10)
Calcium: 8.5 mg/dL (ref 8.4–10.5)
Chloride: 90 mEq/L — ABNORMAL LOW (ref 96–112)
GFR calc non Af Amer: 24 mL/min — ABNORMAL LOW (ref >90)
GFR, EST AFRICAN AMERICAN: 27 mL/min — AB (ref >90)
Glucose, Bld: 505 mg/dL — ABNORMAL HIGH (ref 70–99)
Potassium: 3.9 mEq/L (ref 3.7–5.3)
Sodium: 125 mEq/L — ABNORMAL LOW (ref 137–147)
TOTAL PROTEIN: 7.2 g/dL (ref 6.0–8.3)

## 2013-11-04 LAB — BLOOD GAS, ARTERIAL
Acid-base deficit: 4.9 mmol/L — ABNORMAL HIGH (ref 0.0–2.0)
Bicarbonate: 18.6 mEq/L — ABNORMAL LOW (ref 20.0–24.0)
Drawn by: 234301
O2 Content: 2 L/min
O2 Saturation: 98.1 %
PATIENT TEMPERATURE: 37
PCO2 ART: 28.6 mmHg — AB (ref 35.0–45.0)
TCO2: 16.6 mmol/L (ref 0–100)
pH, Arterial: 7.43 (ref 7.350–7.450)
pO2, Arterial: 109 mmHg — ABNORMAL HIGH (ref 80.0–100.0)

## 2013-11-04 LAB — CBC WITH DIFFERENTIAL/PLATELET
Basophils Absolute: 0 10*3/uL (ref 0.0–0.1)
Basophils Relative: 0 % (ref 0–1)
EOS ABS: 0 10*3/uL (ref 0.0–0.7)
Eosinophils Relative: 0 % (ref 0–5)
HEMATOCRIT: 27.8 % — AB (ref 36.0–46.0)
Hemoglobin: 9.8 g/dL — ABNORMAL LOW (ref 12.0–15.0)
LYMPHS ABS: 1.5 10*3/uL (ref 0.7–4.0)
Lymphocytes Relative: 8 % — ABNORMAL LOW (ref 12–46)
MCH: 29.9 pg (ref 26.0–34.0)
MCHC: 35.3 g/dL (ref 30.0–36.0)
MCV: 84.8 fL (ref 78.0–100.0)
Monocytes Absolute: 1.3 10*3/uL — ABNORMAL HIGH (ref 0.1–1.0)
Monocytes Relative: 7 % (ref 3–12)
NEUTROS ABS: 15.5 10*3/uL — AB (ref 1.7–7.7)
Neutrophils Relative %: 85 % — ABNORMAL HIGH (ref 43–77)
Platelets: 152 10*3/uL (ref 150–400)
RBC: 3.28 MIL/uL — ABNORMAL LOW (ref 3.87–5.11)
RDW: 13.7 % (ref 11.5–15.5)
WBC Morphology: INCREASED
WBC: 18.3 10*3/uL — AB (ref 4.0–10.5)

## 2013-11-04 LAB — CLOSTRIDIUM DIFFICILE BY PCR: Toxigenic C. Difficile by PCR: NEGATIVE

## 2013-11-04 LAB — URINE MICROSCOPIC-ADD ON

## 2013-11-04 LAB — URINALYSIS, ROUTINE W REFLEX MICROSCOPIC
Bilirubin Urine: NEGATIVE
Glucose, UA: >1000 mg/dL — AB
Ketones, ur: NEGATIVE mg/dL
LEUKOCYTES UA: NEGATIVE
NITRITE: NEGATIVE
PH: 5.5 (ref 5.0–8.0)
Protein, ur: 100 mg/dL — AB
Specific Gravity, Urine: 1.02 (ref 1.005–1.030)
Urobilinogen, UA: 0.2 mg/dL (ref 0.0–1.0)

## 2013-11-04 LAB — GLUCOSE, CAPILLARY
GLUCOSE-CAPILLARY: 422 mg/dL — AB (ref 70–99)
GLUCOSE-CAPILLARY: 356 mg/dL — AB (ref 70–99)
GLUCOSE-CAPILLARY: 192 mg/dL — AB (ref 70–99)
Glucose-Capillary: 469 mg/dL — ABNORMAL HIGH (ref 70–99)
Glucose-Capillary: 435 mg/dL — ABNORMAL HIGH (ref 70–99)
Glucose-Capillary: 404 mg/dL — ABNORMAL HIGH (ref 70–99)
Glucose-Capillary: 237 mg/dL — ABNORMAL HIGH (ref 70–99)
Glucose-Capillary: 216 mg/dL — ABNORMAL HIGH (ref 70–99)
Glucose-Capillary: 170 mg/dL — ABNORMAL HIGH (ref 70–99)

## 2013-11-04 LAB — INFLUENZA PANEL BY PCR (TYPE A & B)
H1N1FLUPCR: NOT DETECTED
Influenza A By PCR: NEGATIVE
Influenza B By PCR: NEGATIVE

## 2013-11-04 LAB — PROTIME-INR
INR: 1.26 (ref 0.00–1.49)
Prothrombin Time: 15.5 seconds — ABNORMAL HIGH (ref 11.6–15.2)

## 2013-11-04 LAB — LIPASE, BLOOD: LIPASE: 8 U/L — AB (ref 11–59)

## 2013-11-04 LAB — DIGOXIN LEVEL: DIGOXIN LVL: 0.8 ng/mL (ref 0.8–2.0)

## 2013-11-04 MED ORDER — SODIUM CHLORIDE 0.9 % IV SOLN
Freq: Once | INTRAVENOUS | Status: AC
  Administered 2013-11-04: 08:00:00 via INTRAVENOUS

## 2013-11-04 MED ORDER — ACETAMINOPHEN 500 MG PO TABS
500.0000 mg | ORAL_TABLET | ORAL | Status: DC | PRN
  Administered 2013-11-04 – 2013-11-18 (×14): 500 mg via ORAL
  Filled 2013-11-04 (×14): qty 1

## 2013-11-04 MED ORDER — AMLODIPINE BESYLATE 10 MG PO TABS
10.0000 mg | ORAL_TABLET | Freq: Two times a day (BID) | ORAL | Status: DC
  Administered 2013-11-04 – 2013-11-07 (×6): 10 mg via ORAL
  Filled 2013-11-04 (×6): qty 2
  Filled 2013-11-04: qty 1
  Filled 2013-11-04: qty 2

## 2013-11-04 MED ORDER — PNEUMOCOCCAL VAC POLYVALENT 25 MCG/0.5ML IJ INJ
0.5000 mL | INJECTION | INTRAMUSCULAR | Status: AC
  Administered 2013-11-06: 0.5 mL via INTRAMUSCULAR
  Filled 2013-11-04: qty 0.5

## 2013-11-04 MED ORDER — DIGOXIN 125 MCG PO TABS
0.1250 mg | ORAL_TABLET | Freq: Every day | ORAL | Status: DC
  Administered 2013-11-04 – 2013-11-07 (×4): 0.125 mg via ORAL
  Filled 2013-11-04 (×5): qty 1

## 2013-11-04 MED ORDER — NITROGLYCERIN 0.4 MG SL SUBL: 0.4000 mg | SUBLINGUAL_TABLET | SUBLINGUAL | Status: DC | PRN

## 2013-11-04 MED ORDER — SODIUM CHLORIDE 0.9 % IV SOLN
INTRAVENOUS | Status: DC
  Administered 2013-11-04: 11:00:00 via INTRAVENOUS
  Filled 2013-11-04: qty 1

## 2013-11-04 MED ORDER — CARVEDILOL 25 MG PO TABS
25.0000 mg | ORAL_TABLET | Freq: Two times a day (BID) | ORAL | Status: DC
  Administered 2013-11-04 – 2013-11-07 (×7): 25 mg via ORAL
  Filled 2013-11-04: qty 2
  Filled 2013-11-04 (×2): qty 1
  Filled 2013-11-04 (×4): qty 2
  Filled 2013-11-04: qty 8
  Filled 2013-11-04: qty 2

## 2013-11-04 MED ORDER — ACETAMINOPHEN 325 MG PO TABS
650.0000 mg | ORAL_TABLET | Freq: Once | ORAL | Status: AC
  Administered 2013-11-04: 650 mg via ORAL

## 2013-11-04 MED ORDER — INSULIN GLARGINE 100 UNIT/ML ~~LOC~~ SOLN
45.0000 [IU] | Freq: Every day | SUBCUTANEOUS | Status: DC
  Administered 2013-11-04 – 2013-11-05 (×2): 45 [IU] via SUBCUTANEOUS
  Filled 2013-11-04 (×2): qty 0.45

## 2013-11-04 MED ORDER — ACETAMINOPHEN 325 MG PO TABS
ORAL_TABLET | ORAL | Status: AC
  Administered 2013-11-04: 650 mg via ORAL
  Filled 2013-11-04: qty 2

## 2013-11-04 MED ORDER — INSULIN ASPART 100 UNIT/ML ~~LOC~~ SOLN
0.0000 [IU] | Freq: Three times a day (TID) | SUBCUTANEOUS | Status: DC
  Administered 2013-11-04 – 2013-11-05 (×2): 5 [IU] via SUBCUTANEOUS
  Administered 2013-11-05: 15 [IU] via SUBCUTANEOUS
  Administered 2013-11-05: 8 [IU] via SUBCUTANEOUS

## 2013-11-04 MED ORDER — LOSARTAN POTASSIUM 50 MG PO TABS
50.0000 mg | ORAL_TABLET | Freq: Two times a day (BID) | ORAL | Status: DC
  Administered 2013-11-04 – 2013-11-07 (×6): 50 mg via ORAL
  Filled 2013-11-04 (×8): qty 1

## 2013-11-04 MED ORDER — DEXTROSE-NACL 5-0.45 % IV SOLN
INTRAVENOUS | Status: DC
  Administered 2013-11-05: 02:00:00 via INTRAVENOUS

## 2013-11-04 MED ORDER — DEXTROSE 50 % IV SOLN: 25.0000 mL | INTRAVENOUS | Status: DC | PRN

## 2013-11-04 MED ORDER — LOPERAMIDE HCL 2 MG PO CAPS
2.0000 mg | ORAL_CAPSULE | ORAL | Status: DC | PRN
  Filled 2013-11-04: qty 1

## 2013-11-04 MED ORDER — SODIUM CHLORIDE 0.9 % IV SOLN: INTRAVENOUS | Status: DC

## 2013-11-04 NOTE — ED Notes (Signed)
Per EMS, pt has not eat or drink anything since Monday. Per pt family, pt started vomiting on Wednesday with diarrhea starting yesterday. Pt alert. Pt cbg en route reading high. Pt received 45 units of novolog approximately 45 minutes ago.

## 2013-11-04 NOTE — ED Provider Notes (Signed)
CSN: [id number]     Arrival date & time 11/04/13  [id number] History  This chart was scribed for Carmin Muskrat, MD by Maree Erie, ED Scribe. The patient was seen in room APA14/APA14. Patient's care was started at 8:01 AM.    Chief Complaint  Patient presents with  . Failure To Thrive  . Hyperglycemia    Patient is a 68 y.o. female presenting with hyperglycemia. The history is provided by the patient. No language interpreter was used.  Hyperglycemia Associated symptoms: dizziness and vomiting   Associated symptoms: no abdominal pain     HPI Comments: [name] is a 68 y.o. female with a history of DM type II, HTN, Chronic systolic heart failure, nonobstructive coronary atherosclerosis, nonischemic cardiomypathy, PAD, and ICD in place, brought in by ambulance, who presents to the Emergency Department due to constant hyperglycemia for the past few days. She states that she takes insulin at home and states she has been using her medication compliantly. However, the CBG in EMS was too high to give an number. She states that she has been having a headache, been dizzy, feeling unsteady and "wobbly headed." She also reports fever at home with vomiting for three days with a max temperature recorded of 104. She denies syncope, abdominal pain or shortness of breath.  Her Cardiologist is Dr. [name] and PCP is Dr. Everette Rank.    Past Medical History  Diagnosis Date  . Diabetes mellitus, type 2   . Essential hypertension, benign   . Chronic systolic heart failure   . Coronary atherosclerosis of native coronary artery     Nonobstructive  . Nonischemic cardiomyopathy     LVEF 20%  . PAD (peripheral artery disease)     Left foot gangrene  . Anxiety   . Depression   . ICD (implantable cardiac defibrillator) in place    Past Surgical History  Procedure Laterality Date  . Abdominal hysterectomy    . Left shoulder surgery      Rotator Cuff  . Breast lumpectomy      Bil  . Cardiac  defibrillator placement      Medtronic [id number]   . Exploratory laparotomy with lysis of adhesions    . Bladder surgery  2011 BENIGN MASS REMOVED FROM BLADDER  . Eye surgery  BILATERAL CATARACTS REMOVAL  10 YEARS AGO PER PATIENT  . Pr vein bypass graft,aorto-fem-pop  05-2011    Left popliteal- PTA graft   . Viterectomy  11/10/2012    OD   Dr Zigmund Daniel  . 25 gauge pars plana vitrectomy with 20 gauge mvr port for macular hole Right 11/10/2012    Procedure: 24 GAUGE PARS PLANA VITRECTOMY WITH 20 GAUGE MVR PORT FOR MACULAR HOLE;  Surgeon: Hayden Pedro, MD;  Location: [place];  Service: Ophthalmology;  Laterality: Right;  . Gas insertion Right 11/10/2012    Procedure: INSERTION OF GAS;  Surgeon: Hayden Pedro, MD;  Location: [place];  Service: Ophthalmology;  Laterality: Right;  C3F8  . Serum patch Right 11/10/2012    Procedure: SERUM PATCH;  Surgeon: Hayden Pedro, MD;  Location: [place];  Service: Ophthalmology;  Laterality: Right;  . 25 gauge pars plana vitrectomy with 20 gauge mvr port for macular hole Right 05/09/2013    Procedure: 46 GAUGE PARS PLANA VITRECTOMY WITH 20 GAUGE MVR PORT FOR MACULAR HOLE;  Surgeon: Hayden Pedro, MD;  Location: [place];  Service: Ophthalmology;  Laterality: Right;  . Gas/fluid exchange Right 05/09/2013  Procedure: GAS/FLUID EXCHANGE;  Surgeon: Hayden Pedro, MD;  Location: [place];  Service: Ophthalmology;  Laterality: Right;  C3F8   . Serum patch Right 05/09/2013    Procedure: SERUM PATCH;  Surgeon: Hayden Pedro, MD;  Location: [place];  Service: Ophthalmology;  Laterality: Right;  . Laser photo ablation Right 05/09/2013    Procedure: LASER PHOTO ABLATION;  Surgeon: Hayden Pedro, MD;  Location: [place];  Service: Ophthalmology;  Laterality: Right;  Endolaser   . Membrane peel Right 05/09/2013    Procedure: MEMBRANE PEEL;  Surgeon: Hayden Pedro, MD;  Location: [place];  Service: Ophthalmology;  Laterality: Right;   Family History  Problem Relation Age of Onset  .  Cirrhosis Father   . Cancer Mother     Ovarian  . Ovarian cancer Mother    History  Substance Use Topics  . Smoking status: Never Smoker   . Smokeless tobacco: Never Used     Comment: tobacco use-no  . Alcohol Use: No   OB History   Grav Para Term Preterm Abortions TAB SAB Ect Mult Living                 Review of Systems  Constitutional:       Per HPI, otherwise negative  HENT:       Per HPI, otherwise negative  Respiratory:       Per HPI, otherwise negative  Cardiovascular:       Per HPI, otherwise negative  Gastrointestinal: Positive for vomiting. Negative for abdominal pain.  Endocrine:       Negative aside from HPI  Genitourinary:       Neg aside from HPI   Musculoskeletal:       Per HPI, otherwise negative  Skin: Negative.   Neurological: Positive for dizziness and headaches. Negative for syncope.    Allergies  Hydrocodone-acetaminophen  Home Medications   Current Outpatient Rx  Name  Route  Sig  Dispense  Refill  . amLODipine (NORVASC) 10 MG tablet   Oral   Take 1 tablet (10 mg total) by mouth 2 (two) times daily.   60 tablet   6   . aspirin 81 MG tablet   Oral   Take 81 mg by mouth daily.          . carvedilol (COREG) 25 MG tablet   Oral   Take 1 tablet (25 mg total) by mouth 2 (two) times daily with a meal.   60 tablet   6   . digoxin (LANOXIN) 0.125 MG tablet   Oral   Take 1 tablet (0.125 mg total) by mouth daily.   90 tablet   1   . digoxin (LANOXIN) 0.125 MG tablet      TAKE ONE TABLET BY MOUTH ONCE DAILY   90 tablet   6   . docusate sodium (COLACE) 100 MG capsule   Oral   Take 100 mg by mouth daily as needed. For constipation         . furosemide (LASIX) 40 MG tablet   Oral   Take 40 mg by mouth daily.         . hydrALAZINE (APRESOLINE) 100 MG tablet   Oral   Take 1 tablet (100 mg total) by mouth 3 (three) times daily.   90 tablet   5   . insulin glargine (LANTUS) 100 UNIT/ML injection   Subcutaneous   Inject  45 Units into the skin daily.          Marland Kitchen  insulin lispro (HUMALOG) 100 UNIT/ML injection   Subcutaneous   Inject 0-8 Units into the skin every evening. Sliding scale: 200-249=2 units, 250-299=4 units, 300-349=6 units, >350=8 units         . losartan (COZAAR) 50 MG tablet   Oral   Take 1 tablet (50 mg total) by mouth 2 (two) times daily.   90 tablet   1   . nitroGLYCERIN (NITROSTAT) 0.4 MG SL tablet   Sublingual   Place 0.4 mg under the tongue every 5 (five) minutes as needed. chest pain          Triage Vitals: BP 147/60  Pulse 75  Temp(Src) 102.4 F (39.1 C) (Oral)  Resp 18  Ht 5\' 1"  (1.549 m)  Wt 161 lb (73.029 kg)  BMI 30.44 kg/m2  SpO2 92%  Physical Exam  Nursing note and vitals reviewed. Constitutional: She is oriented to person, place, and time. She appears well-developed and well-nourished. No distress.  HENT:  Head: Normocephalic and atraumatic.  Eyes: Conjunctivae and EOM are normal.  Cardiovascular: Normal rate and regular rhythm.   Pulmonary/Chest: Effort normal and breath sounds normal. No stridor. No respiratory distress.  Abdominal: Soft. She exhibits no distension. There is no tenderness. There is no rebound and no guarding.  Musculoskeletal: She exhibits no edema.  No lower extremity edema.  Neurological: She is alert and oriented to person, place, and time. No cranial nerve deficit.  Skin: Skin is warm and dry.  Psychiatric: She has a normal mood and affect.    ED Course  Procedures (including critical care time)   COORDINATION OF CARE: 8:09 AM -Will order IV fluids, chest x-ray, Digoxin level, venous blood gas, CBC, CMP, blood lipase, Urinalysis, Protime-INR and EKG. Patient verbalizes understanding and agrees with treatment plan.  10:04 AM - Patient states she is unchanged upon recheck. Discussed plan to admit patient. Patient verbalizes understanding and agrees with treatment plan.    Labs Review Labs Reviewed  CBC WITH DIFFERENTIAL -  Abnormal; Notable for the following:    WBC 18.3 (*)    RBC 3.28 (*)    Hemoglobin 9.8 (*)    HCT 27.8 (*)    Neutrophils Relative % 85 (*)    Lymphocytes Relative 8 (*)    Neutro Abs 15.5 (*)    Monocytes Absolute 1.3 (*)    All other components within normal limits  COMPREHENSIVE METABOLIC PANEL - Abnormal; Notable for the following:    Sodium 125 (*)    Chloride 90 (*)    Glucose, Bld 505 (*)    BUN 52 (*)    Creatinine, Ser 2.08 (*)    Albumin 2.7 (*)    GFR calc non Af Amer 24 (*)    GFR calc Af Amer 27 (*)    All other components within normal limits  LIPASE, BLOOD - Abnormal; Notable for the following:    Lipase 8 (*)    All other components within normal limits  PROTIME-INR - Abnormal; Notable for the following:    Prothrombin Time 15.5 (*)    All other components within normal limits  GLUCOSE, CAPILLARY - Abnormal; Notable for the following:    Glucose-Capillary 469 (*)    All other components within normal limits  BLOOD GAS, ARTERIAL - Abnormal; Notable for the following:    pCO2 arterial 28.6 (*)    pO2, Arterial 109.0 (*)    Bicarbonate 18.6 (*)    Acid-base deficit 4.9 (*)    All other  components within normal limits  DIGOXIN LEVEL  URINALYSIS, ROUTINE W REFLEX MICROSCOPIC   Imaging Review Dg Chest 2 View  11/04/2013   CLINICAL DATA:  Weakness  EXAM: CHEST - 2 VIEW  COMPARISON:  05/09/2013  FINDINGS: Stable cardiomegaly. Lungs clear. Stable left subclavian AICD. Orthopedic anchor in the right humeral head. . No effusion.  IMPRESSION: Stable cardiomegaly and postop change.  No acute disease.   Electronically Signed   By: Arne Cleveland M.D.   On: 11/04/2013 09:43     EKG with atrial paced rhythm, rate 73, abnormal   After the initial evaluation, with concern for systemic illness, patient was started on IV fluids, though with an infusion, no posterior or history of heart failure.  update: Initial labs notable for hyperglycemia.  After the initial  intervention.  Patient has received Tylenol, she will be started on an insulin drip.  Update: Remainder of labs discussed with the patient and family members. Patient's hyperglycemia, hyponatremia, with anion gap, worsening renal function.  MDM   I personally performed the services described in this documentation, which was scribed in my presence. The recorded information has been reviewed and is accurate.   Patient presents with hyperglycemia, febrile, fatigue.  Patient has multiple medical problems including CHF, insulin-dependent diabetes. With her history a wide differential was considered, including infectious, metabolic, cardiac. Patient's labs demonstrate hyperglycemia, progressive renal dysfunction, anion gap.  With her substantial lab abnormalities she was started on an insulin drip. Eventually the patient's fever stopped, and her glucose improved.  The patient had appropriate mentation throughout, but required admission for further evaluation and management of this episode.  CRITICAL CARE Performed by: Carmin Muskrat Total critical care time: 40 Critical care time was exclusive of separately billable procedures and treating other patients. Critical care was necessary to treat or prevent imminent or life-threatening deterioration. Critical care was time spent personally by me on the following activities: development of treatment plan with patient and/or surrogate as well as nursing, discussions with consultants, evaluation of patient's response to treatment, examination of patient, obtaining history from patient or surrogate, ordering and performing treatments and interventions, ordering and review of laboratory studies, ordering and review of radiographic studies, pulse oximetry and re-evaluation of patient's condition.    Carmin Muskrat, MD 11/04/13 1535

## 2013-11-05 LAB — GLUCOSE, CAPILLARY
GLUCOSE-CAPILLARY: 148 mg/dL — AB (ref 70–99)
GLUCOSE-CAPILLARY: 206 mg/dL — AB (ref 70–99)
GLUCOSE-CAPILLARY: 244 mg/dL — AB (ref 70–99)
Glucose-Capillary: 257 mg/dL — ABNORMAL HIGH (ref 70–99)
Glucose-Capillary: 376 mg/dL — ABNORMAL HIGH (ref 70–99)
Glucose-Capillary: 373 mg/dL — ABNORMAL HIGH (ref 70–99)

## 2013-11-05 MED ORDER — VANCOMYCIN HCL 10 G IV SOLR
1500.0000 mg | Freq: Once | INTRAVENOUS | Status: AC
  Administered 2013-11-05: 1500 mg via INTRAVENOUS
  Filled 2013-11-05: qty 1500

## 2013-11-05 MED ORDER — ENOXAPARIN SODIUM 40 MG/0.4ML ~~LOC~~ SOLN: 40.0000 mg | SUBCUTANEOUS | Status: DC

## 2013-11-05 MED ORDER — ENOXAPARIN SODIUM 30 MG/0.3ML ~~LOC~~ SOLN
30.0000 mg | SUBCUTANEOUS | Status: DC
  Administered 2013-11-05 – 2013-11-07 (×3): 30 mg via SUBCUTANEOUS
  Filled 2013-11-05 (×3): qty 0.3

## 2013-11-05 MED ORDER — VANCOMYCIN HCL IN DEXTROSE 750-5 MG/150ML-% IV SOLN
750.0000 mg | INTRAVENOUS | Status: DC
  Administered 2013-11-06: 750 mg via INTRAVENOUS
  Filled 2013-11-05 (×2): qty 150

## 2013-11-05 MED ORDER — FUROSEMIDE 20 MG PO TABS
20.0000 mg | ORAL_TABLET | Freq: Every day | ORAL | Status: DC
  Administered 2013-11-05 – 2013-11-07 (×3): 20 mg via ORAL
  Filled 2013-11-05 (×4): qty 1

## 2013-11-05 NOTE — H&P (Signed)
NAMEDAJSHA, TURK            ACCOUNT NO.:  [id number]  MEDICAL RECORD NO.:  [id number]  LOCATION:                                 FACILITY:  PHYSICIAN:  Jaleena Viviani G. Everette Rank, MD        DATE OF BIRTH:  DATE OF ADMISSION: DATE OF DISCHARGE:  LH                             HISTORY & PHYSICAL   HISTORY OF PRESENT ILLNESS:  This 68 year old Afro-American female was brought into the emergency department with hyperglycemia and failure-to- thrive and history of dizziness and vomiting.  The patient was brought by EMS.  Family stated she has been running fever for the last few days and her sugar had been extremely high.  She stated that she had been using her medications as prescribed.  Apparently, the glucose was high in the emergency department.  She was placed on Glucomed protocol for approximately 1 hour and sugars came down some and she has been vomiting for 3 days, and elevated fevers, but no abdominal pain or shortness of breath.  She does have problems with chronic systolic heart failure and renal insufficiency as well as hypertension.  After treatment in the emergency department, her sugars came down to 150-200 range and temperature 102, and IV fluids continuing.  SOCIAL HISTORY:  The patient does not smoke or use alcohol.  PAST MEDICAL HISTORY:  Diabetes mellitus type 2; essential hypertension benign; chronic systolic heart failure; coronary atherosclerosis; nonischemic cardiomyopathy; peripheral arterial disease with previous history gangrene of left foot depression; and implantable cardiac defibrillator in place.  PAST SURGICAL HISTORY:  Previous abdominal hysterectomy, left shoulder surgery, breast lumpectomy, cardiac defibrillator transplant, exploratory laparotomy, lysis of adhesions and bladder surgery, and previous cataract surgery, vein bypass graft, and aorta fem-pop.  FAMILY HISTORY:  Cirrhosis in father; cancer in mother and ovarian cancer in  mother.  ALLERGIES:  To hydrocodone and acetaminophen.  REVIEW OF SYSTEMS:  HEENT:  Negative.  CARDIOPULMONARY:  Occasional cough and dyspnea.  GI:  Episodes of vomiting.  No diarrhea.  GU:  No dysuria, hematuria.  HOME MEDICATIONS:  Amlodipine 60 mg b.i.d., aspirin 81 mg daily, carvedilol 25 mg b.i.d., Lanoxin 0.125 mg daily, Colace 100 mg daily, furosemide 40 mg daily, hydralazine 100 mg t.i.d., Lantus insulin 45 units daily, Humalog by sliding scale, Cozaar 50 mg b.i.d., and Nitrostat 0.4 mg p.r.n.  PHYSICAL EXAMINATION:  GENERAL:  The patient is alert and oriented. VITAL SIGNS:  Blood pressure 118/46, respiration 27, pulse 63, temp 102.8. HEENT:  Eyes; PERRLA.  TM negative.  Oropharynx benign. NECK:  Supple.  No JVD or thyroid abnormalities. LUNGS:  Clear to P and A. HEART:  Regular rhythm.  No murmurs. ABDOMEN:  No palpable organs or masses.  No organomegaly. EXTREMITIES:  Free of edema. NEUROLOGICAL:  The patient is alert.  Cranial nerves intact.  No sensory or motor abnormalities.  ASSESSMENT:  Diabetes mellitus with hyperglycemia due to unknown origin at this time, history of nonischemic cardiomyopathy with low ejection fraction, and chronic systolic heart failure, and renal failure stage III.  PLAN:  Supportive care, continue IV fluids, IV normal saline until sugars are down more than D5 half-normal saline.  We will start NovoLog insulin according  to sliding scale moderate.  Continue Zofran p.r.n. for nausea.  Continue to control fever with Tylenol and support measures. We will check for flu and start back furosemide when vomiting is under control.     Jack Bolio G. Everette Rank, MD     AGM/MEDQ  D:  11/04/2013  T:  11/05/2013  Job:  [id number]

## 2013-11-05 NOTE — Progress Notes (Signed)
Patient has tolerated clear liquids today with no nausea or vomiting.  Dr. Everette Rank gave order to restart patient's Lasix 20 mg po daily with daily weights and intact and output.

## 2013-11-05 NOTE — Progress Notes (Signed)
CRITICAL VALUE ALERT  Critical value received:  2 blood cultures positive for gram positive cocci  Date of notification:  11/05/2013  Time of notification:  0731  Critical value read back:yes  Nurse who received alert:  Gershon Cull  MD notified (1st page):  Dr. Everette Rank  Time of first page:  [phone]  MD notified (2nd page):  Time of second page:  Responding MD:  Dr. Everette Rank  Time MD responded:  [phone]  Dr. Everette Rank on unit to see patient.  Gave verbal order to start Vancomycin per pharmacy consult.

## 2013-11-05 NOTE — Progress Notes (Signed)
ANTIBIOTIC CONSULT NOTE - INITIAL  Pharmacy Consult for Vancomycin Indication: bacteremia  Allergies  Allergen Reactions  . Hydrocodone-Acetaminophen Other (See Comments)    hallucinations    Patient Measurements: Height: 5\' 1"  (154.9 cm) Weight: 161 lb (73.029 kg) IBW/kg (Calculated) : 47.8  Vital Signs: Temp: 99 F (37.2 C) (02/01 0512) Temp src: Oral (02/01 0512) BP: 149/57 mmHg (02/01 0851) Pulse Rate: 71 (02/01 0851) Intake/Output from previous day: 01/31 0701 - 02/01 0700 In: 637.5 [I.V.:637.5] Out: 8 [Urine:8] Intake/Output from this shift:    Labs:  Recent Labs  11/04/13 0832  WBC 18.3*  HGB 9.8*  PLT 152  CREATININE 2.08*   Estimated Creatinine Clearance: 24 ml/min (by C-G formula based on Cr of 2.08). No results found for this basename: VANCOTROUGH, Corlis Leak, VANCORANDOM, [place], GENTPEAK, GENTRANDOM, [place], TOBRAPEAK, TOBRARND, AMIKACINPEAK, AMIKACINTROU, AMIKACIN,  in the last 72 hours   Microbiology: Recent Results (from the past 720 hour(s))  CLOSTRIDIUM DIFFICILE BY PCR     Status: None   Collection Time    11/04/13  3:55 PM      Result Value Range Status   C difficile by pcr NEGATIVE  NEGATIVE Final  CULTURE, BLOOD (ROUTINE X 2)     Status: None   Collection Time    11/04/13  5:13 PM      Result Value Range Status   Specimen Description BLOOD LEFT HAND   Final   Special Requests BOTTLES DRAWN AEROBIC AND ANAEROBIC 10CC EACH   Final   Culture  Setup Time     Final   Value: GRAM POSITIVE COCCI     Gram Stain Report Called to,Read Back By and Verified With: HAWKINS M. AT 0729A ON [id number] BY THOMPSON S.   Culture NO GROWTH 1 DAY   Final   Report Status PENDING   Incomplete  CULTURE, BLOOD (ROUTINE X 2)     Status: None   Collection Time    11/04/13  5:19 PM      Result Value Range Status   Specimen Description BLOOD RIGHT ARM   Final   Special Requests BOTTLES DRAWN AEROBIC AND ANAEROBIC 10CC EACH   Final   Culture  Setup Time      Final   Value: GRAM POSITIVE COCCI     Gram Stain Report Called to,Read Back By and Verified With: HAWKINS M. AT 0729A ON [id number] BY THOMPSON S.   Culture NO GROWTH 1 DAY   Final   Report Status PENDING   Incomplete    Medical History: Past Medical History  Diagnosis Date  . Diabetes mellitus, type 2   . Essential hypertension, benign   . Chronic systolic heart failure   . Coronary atherosclerosis of native coronary artery     Nonobstructive  . Nonischemic cardiomyopathy     LVEF 20%  . PAD (peripheral artery disease)     Left foot gangrene  . Anxiety   . Depression   . ICD (implantable cardiac defibrillator) in place     ANTIBIOTICS:  Vancomycin 2/1 >>  Assessment: 68yo female with positive blood cultures for GPC.  Pt has elevated SCr.  Estimated Creatinine Clearance: 24 ml/min (by C-G formula based on Cr of 2.08).  Goal of Therapy:  Vancomycin trough level 15-20 mcg/ml  Plan:  Vancomycin 1500mg  IV now x 1 dose (loading dose) then Vancomycin 750mg  IV q24hrs Check trough at steady state Monitor labs, renal fxn, and cultures  Hart Robinsons A 11/05/2013,10:26 AM

## 2013-11-05 NOTE — Progress Notes (Signed)
  CARE MANAGEMENT ED NOTE 11/05/2013  Patient:  [name], [name]   Account Number:  [id number]  Date Initiated:  11/05/2013  Documentation initiated by:  Joan Flores  Subjective/Objective Assessment:     Subjective/Objective Assessment Detail:     54 female presented to ED complains of vomiting, elevated fevers, failure to thrive, hyperglycemia.  Highest reported BS 505.  Patient has a reported history of diabetes mellitus with hyperglycemia due to unknown origin at this time, history of nonischemic cardiomyopathy with low ejection fraction, and chronic systolic heart failure, and renal failure stage III.    Patient admitted and patient to receive IV fluids, NovoLog  insulin, Zofran p.r.n. for nausea,  and continue to control fever with Tylenol.     Action/Plan:   Action/Plan Detail:   Anticipated DC Date:  11/08/2013     Status Recommendation to Physician:   Result of Recommendation:    Other ED Services  Consult Working [place]  CM consult    Choice offered to / List presented to:            Status of service:  Completed, signed off  ED Comments:   ED Comments Detail:

## 2013-11-06 ENCOUNTER — Encounter (HOSPITAL_COMMUNITY): Payer: Medicare Other

## 2013-11-06 ENCOUNTER — Inpatient Hospital Stay (HOSPITAL_COMMUNITY): Payer: Medicare Other

## 2013-11-06 LAB — GLUCOSE, CAPILLARY
GLUCOSE-CAPILLARY: 382 mg/dL — AB (ref 70–99)
GLUCOSE-CAPILLARY: 166 mg/dL — AB (ref 70–99)
GLUCOSE-CAPILLARY: 131 mg/dL — AB (ref 70–99)
GLUCOSE-CAPILLARY: 146 mg/dL — AB (ref 70–99)
Glucose-Capillary: 528 mg/dL — ABNORMAL HIGH (ref 70–99)
Glucose-Capillary: 366 mg/dL — ABNORMAL HIGH (ref 70–99)
Glucose-Capillary: 291 mg/dL — ABNORMAL HIGH (ref 70–99)
Glucose-Capillary: 255 mg/dL — ABNORMAL HIGH (ref 70–99)
Glucose-Capillary: 211 mg/dL — ABNORMAL HIGH (ref 70–99)
Glucose-Capillary: 133 mg/dL — ABNORMAL HIGH (ref 70–99)
Glucose-Capillary: 134 mg/dL — ABNORMAL HIGH (ref 70–99)
Glucose-Capillary: 113 mg/dL — ABNORMAL HIGH (ref 70–99)
Glucose-Capillary: 206 mg/dL — ABNORMAL HIGH (ref 70–99)

## 2013-11-06 LAB — BASIC METABOLIC PANEL
BUN: 64 mg/dL — AB (ref 6–23)
CHLORIDE: 89 meq/L — AB (ref 96–112)
CO2: 16 meq/L — AB (ref 19–32)
Calcium: 7.3 mg/dL — ABNORMAL LOW (ref 8.4–10.5)
Creatinine, Ser: 2.95 mg/dL — ABNORMAL HIGH (ref 0.50–1.10)
GFR calc Af Amer: 18 mL/min — ABNORMAL LOW (ref >90)
GFR calc non Af Amer: 15 mL/min — ABNORMAL LOW (ref >90)
Glucose, Bld: 522 mg/dL — ABNORMAL HIGH (ref 70–99)
Potassium: 3.4 mEq/L — ABNORMAL LOW (ref 3.7–5.3)
Sodium: 120 mEq/L — CL (ref 137–147)

## 2013-11-06 LAB — HEMOGLOBIN A1C
HEMOGLOBIN A1C: 10.5 % — AB (ref <5.7)
MEAN PLASMA GLUCOSE: 255 mg/dL — AB (ref <117)

## 2013-11-06 LAB — URINE CULTURE: Colony Count: 65000

## 2013-11-06 LAB — GLUCOSE, RANDOM
GLUCOSE: 556 mg/dL — AB (ref 70–99)
Glucose, Bld: 515 mg/dL — ABNORMAL HIGH (ref 70–99)

## 2013-11-06 LAB — MRSA PCR SCREENING: MRSA by PCR: POSITIVE — AB

## 2013-11-06 MED ORDER — SODIUM CHLORIDE 0.9 % IV SOLN
INTRAVENOUS | Status: DC
  Administered 2013-11-06: 13:00:00 via INTRAVENOUS

## 2013-11-06 MED ORDER — CHLORHEXIDINE GLUCONATE CLOTH 2 % EX PADS
6.0000 | MEDICATED_PAD | Freq: Every day | CUTANEOUS | Status: AC
Start: 2013-11-07 — End: 2013-11-11
  Administered 2013-11-07 – 2013-11-11 (×5): 6 via TOPICAL

## 2013-11-06 MED ORDER — SODIUM CHLORIDE 0.45 % IV SOLN
INTRAVENOUS | Status: DC
  Administered 2013-11-06: 09:00:00 via INTRAVENOUS

## 2013-11-06 MED ORDER — INSULIN GLARGINE 100 UNIT/ML ~~LOC~~ SOLN
50.0000 [IU] | Freq: Every day | SUBCUTANEOUS | Status: DC
  Filled 2013-11-06: qty 0.5

## 2013-11-06 MED ORDER — MUPIROCIN 2 % EX OINT
1.0000 | TOPICAL_OINTMENT | Freq: Two times a day (BID) | CUTANEOUS | Status: AC
Start: 2013-11-06 — End: 2013-11-11
  Administered 2013-11-06 – 2013-11-11 (×8): 1 via NASAL
  Filled 2013-11-06 (×5): qty 22

## 2013-11-06 MED ORDER — SODIUM CHLORIDE 0.9 % IJ SOLN
10.0000 mL | Freq: Two times a day (BID) | INTRAMUSCULAR | Status: DC
  Administered 2013-11-06 – 2013-11-08 (×4): 10 mL

## 2013-11-06 MED ORDER — SODIUM CHLORIDE 0.9 % IV BOLUS (SEPSIS)
250.0000 mL | Freq: Once | INTRAVENOUS | Status: AC
  Administered 2013-11-06: 250 mL via INTRAVENOUS

## 2013-11-06 MED ORDER — SODIUM CHLORIDE 0.9 % IV SOLN: INTRAVENOUS | Status: DC

## 2013-11-06 MED ORDER — INSULIN ASPART 100 UNIT/ML ~~LOC~~ SOLN
20.0000 [IU] | Freq: Once | SUBCUTANEOUS | Status: AC
  Administered 2013-11-06: 20 [IU] via SUBCUTANEOUS

## 2013-11-06 MED ORDER — RIFAMPIN 300 MG PO CAPS
300.0000 mg | ORAL_CAPSULE | Freq: Two times a day (BID) | ORAL | Status: DC
  Administered 2013-11-06 – 2013-11-10 (×8): 300 mg via ORAL
  Filled 2013-11-06 (×13): qty 1

## 2013-11-06 MED ORDER — INSULIN REGULAR BOLUS VIA INFUSION
0.0000 [IU] | Freq: Three times a day (TID) | INTRAVENOUS | Status: DC
  Filled 2013-11-06: qty 10

## 2013-11-06 MED ORDER — BOOST / RESOURCE BREEZE PO LIQD
1.0000 | Freq: Three times a day (TID) | ORAL | Status: DC
  Administered 2013-11-06 – 2013-11-08 (×5): 1 via ORAL

## 2013-11-06 MED ORDER — CEFAZOLIN SODIUM-DEXTROSE 2-3 GM-% IV SOLR
2.0000 g | Freq: Two times a day (BID) | INTRAVENOUS | Status: DC
  Administered 2013-11-06 – 2013-11-07 (×2): 2 g via INTRAVENOUS
  Filled 2013-11-06 (×3): qty 50

## 2013-11-06 MED ORDER — SODIUM CHLORIDE 0.9 % IJ SOLN
10.0000 mL | INTRAMUSCULAR | Status: DC | PRN
  Administered 2013-11-06: 30 mL
  Administered 2013-11-14: 10 mL
  Administered 2013-11-14: 20 mL
  Administered 2013-11-15: 10 mL
  Administered 2013-11-16: 20 mL
  Administered 2013-11-17 – 2013-11-20 (×7): 10 mL

## 2013-11-06 MED ORDER — SODIUM CHLORIDE 0.9 % IV SOLN
INTRAVENOUS | Status: DC
  Administered 2013-11-06: 3.2 [IU]/h via INTRAVENOUS
  Administered 2013-11-07: 2.8 [IU]/h via INTRAVENOUS
  Filled 2013-11-06 (×2): qty 1

## 2013-11-06 MED ORDER — DEXTROSE-NACL 5-0.45 % IV SOLN
INTRAVENOUS | Status: DC
  Administered 2013-11-06 – 2013-11-08 (×3): via INTRAVENOUS

## 2013-11-06 MED ORDER — DEXTROSE 50 % IV SOLN: 25.0000 mL | INTRAVENOUS | Status: DC | PRN

## 2013-11-06 NOTE — Progress Notes (Signed)
NAMEINEISHA, MOFFA            ACCOUNT NO.:  [id number]  MEDICAL RECORD NO.:  [id number]  LOCATION:  [facility]                         FACILITY:  [facility]  PHYSICIAN:  Shirell Struthers G. Everette Rank, MD   DATE OF BIRTH:  09-27-1946  DATE OF PROCEDURE:  11/05/2013 DATE OF DISCHARGE:                                PROGRESS NOTE   SUBJECTIVE:  This patient was admitted with hyperglycemia, history of high fever, vomiting, and diarrhea.  These symptoms have subsided.  She now has a low-grade fever.  She did have a positive blood culture, gram- positive cocci.  Her sugars now are more stable range between 170 to 206.  OBJECTIVE:  VITAL SIGNS:  Blood pressure 145/70, respirations 20, pulse 63, temp 99. HEENT:  Eyes, PERRLA.  TM negative.  Oropharynx benign. NECK:  Supple.  No JVD or thyroid abnormalities. LUNGS:  Clear to P and A. HEART:  Regular rhythm. ABDOMEN:  No palpable organs or masses.  No organomegaly.  ASSESSMENT: 1. Diabetes mellitus with hyperglycemia, these symptoms have improved     with the current insulin dosage. 2. Gram-positive cocci on blood culture.  PLAN:  To start IV vancomycin.  Chronic systolic heart failure and renal failure stage III.  We will start back on low-dose furosemide.     Johanne Mcglade G. Everette Rank, MD     AGM/MEDQ  D:  11/05/2013  T:  11/05/2013  Job:  [id number]

## 2013-11-06 NOTE — Progress Notes (Signed)
Sharon Basques, MD notified that urine culture positive for MRSA. Orders given to D/C ancef.

## 2013-11-06 NOTE — Plan of Care (Signed)
Report called to Janett Billow, RN (ICU) pt will be transferred in about 10 minutes when room is cleaned.

## 2013-11-06 NOTE — Clinical Documentation Improvement (Signed)
THIS DOCUMENT IS NOT A PERMANENT PART OF THE MEDICAL RECORD  Please update your documentation within the medical record to reflect your response to this query. If you need help knowing how to do this please call [phone].  11/06/13  Dear Dr. Everette Rank Rolley Sims  A review of the medical record has revealed the following indicators.  Based on your clinical judgment, please clarify and document in a progress note and/or discharge summary the clinical condition associated with the following supporting information:  Abnormal findings (laboratory, x-ray, pathologic, and other diagnostic results) are not coded and reported unless the physician indicates their clinical significance.   The medical record reflects the following clinical findings, please clarify the diagnostic and/or clinical significance:      Possible Clinical Conditions?                                 Supporting Information:  Hyponatremia              Lab Test: Sodium  Other Condition             Normal Values : 137-147  Cannot Clinically Determine                       Patient Values: 1/31=125; 2/2=120                        Treatment: IV NS and monitor labs         Reviewed: Respond found in consult note Thank You,  [facility] Clinical Documentation Specialist: [place]

## 2013-11-06 NOTE — Plan of Care (Addendum)
Pt has BS of 528 and Na+ of 120.  Paged Dr. Marletta Lor - waiting on response. Ordered stat lab glucose.  Instructed pt/family to not eat till we have new orders. - called back and new orders placed

## 2013-11-06 NOTE — Consult Note (Signed)
DEEANNE [name] MRN: [id number] DOB/AGE: [birth year] 67 y.o. Primary Care Physician:MCINNIS,ANGUS G, MD Admit date: 11/04/2013 Chief Complaint:  Consult for uncontrolled type 2  DM with acute hyperglycemia. HPI: 68 yr old female with multiple medical problems as follows. She is being seen in consultation for currently uncontrolled type 2 DM requested by Dr. Everette Rank. She is in hospital for  Severe hyperglycemia and failure to thrive. She was brought into the emergency department  By EMS due to hyperglycemia and nausea/vomiting. She is subsequently diagnosed with septicemia and her BG continued to be fluctuating, lately above 500+. She tells me she was diagnosed with type 2 DM 15 years ago, shhe is not contsistent in taking her insulin  ( Lantus 45 units  Qhs, and Humalog SSI). No a1c in Epic system. She is very sick and can not elaborate on her history. She is current;ly in ICU on insulin drip. Her  Sodium was found to be low at 120 this AM, from 125 yesterday.  .she also has had fevers x 3 day PTA associated with n/v/diarrhea.  She does have problems with chronic systolic heart failure and  renal insufficiency as well as hypertension.   Past Medical History  Diagnosis Date  . Diabetes mellitus, type 2   . Essential hypertension, benign   . Chronic systolic heart failure   . Coronary atherosclerosis of native coronary artery     Nonobstructive  . Nonischemic cardiomyopathy     LVEF 20%  . PAD (peripheral artery disease)     Left foot gangrene  . Anxiety   . Depression   . ICD (implantable cardiac defibrillator) in place     Family History  Problem Relation Age of Onset  . Cirrhosis Father   . Cancer Mother     Ovarian  . Ovarian cancer Mother    Social History:  reports that she has never smoked. She has never used smokeless tobacco. She reports that she does not drink alcohol or use illicit drugs.   Allergies:  Allergies  Allergen Reactions  . Hydrocodone-Acetaminophen  Other (See Comments)    hallucinations    Medications Prior to Admission  Medication Sig Dispense Refill  . amLODipine (NORVASC) 10 MG tablet Take 1 tablet (10 mg total) by mouth 2 (two) times daily.  60 tablet  6  . aspirin 81 MG tablet Take 81 mg by mouth daily.       . carvedilol (COREG) 25 MG tablet Take 1 tablet (25 mg total) by mouth 2 (two) times daily with a meal.  60 tablet  6  . digoxin (LANOXIN) 0.125 MG tablet Take 1 tablet (0.125 mg total) by mouth daily.  90 tablet  1  . docusate sodium (COLACE) 100 MG capsule Take 100 mg by mouth daily as needed. For constipation      . furosemide (LASIX) 40 MG tablet Take 40 mg by mouth daily.      . hydrALAZINE (APRESOLINE) 100 MG tablet Take 1 tablet (100 mg total) by mouth 3 (three) times daily.  90 tablet  5  . insulin glargine (LANTUS) 100 UNIT/ML injection Inject 45 Units into the skin daily.       . insulin lispro (HUMALOG) 100 UNIT/ML injection Inject 0-8 Units into the skin every evening. Sliding scale: 200-249=2 units, 250-299=4 units, 300-349=6 units, >350=8 units      . losartan (COZAAR) 50 MG tablet Take 1 tablet (50 mg total) by mouth 2 (two) times daily.  90 tablet  1  .  nitroGLYCERIN (NITROSTAT) 0.4 MG SL tablet Place 0.4 mg under the tongue every 5 (five) minutes as needed. chest pain           [id number] from the symptoms mentioned above,there are no other symptoms referable to all systems reviewed.  Physical Exam: Blood pressure 132/60, pulse 67, temperature 101.8 F (38.8 C), temperature source Oral, resp. rate 20, height 5\' 1"  (1.549 m), weight 72 kg (158 lb 11.7 oz), SpO2 98.00%. Gen : sick looking , not in distress, using oxygen by Nasal canula in ICU HEENT: coated tongue, Neck" distended neck veins ,  Chest : poor air entry , bilaterally. CVS: distant heat sounds, no m/g Abd: soft, non tender Ext; Pitting trace edema on BLE. Skin: dry skin, poor hygiene     Recent Labs  11/04/13 0832  WBC 18.3*   NEUTROABS 15.5*  HGB 9.8*  HCT 27.8*  MCV 84.8  PLT 152    Recent Labs  11/04/13 0832 11/06/13 0637 11/06/13 0817 11/06/13 0941  NA 125* 120*  --   --   K 3.9 3.4*  --   --   CL 90* 89*  --   --   CO2 20 16*  --   --   GLUCOSE 505* 522* 556* 515*  BUN 52* 64*  --   --   CREATININE 2.08* 2.95*  --   --   CALCIUM 8.5 7.3*  --   --     Recent Labs  11/04/13 0832  AST 27  ALT 12  ALKPHOS 108  BILITOT 0.4  PROT 7.2  ALBUMIN 2.7*   Recent Results (from the past 240 hour(s))  URINE CULTURE     Status: None   Collection Time    11/04/13 11:50 AM      Result Value Range Status   Specimen Description URINE, RANDOM   Final   Special Requests Normal   Final   Culture  Setup Time     Final   Value: 11/04/2013 21:17     Performed at [facility] Count     Final   Value: 65,000 COLONIES/ML     Performed at [facility]   Culture     Final   Value: STAPHYLOCOCCUS AUREUS     Note: RIFAMPIN AND GENTAMICIN SHOULD NOT BE USED AS SINGLE DRUGS FOR TREATMENT OF STAPH INFECTIONS.     Performed at [facility]   Report Status PENDING   Incomplete  STOOL CULTURE     Status: None   Collection Time    11/04/13  3:55 PM      Result Value Range Status   Specimen Description STOOL   Final   Special Requests NONE   Final   Culture     Final   Value: Culture reincubated for better growth     Performed at [facility]   Report Status PENDING   Incomplete  CLOSTRIDIUM DIFFICILE BY PCR     Status: None   Collection Time    11/04/13  3:55 PM      Result Value Range Status   C difficile by pcr NEGATIVE  NEGATIVE Final  CULTURE, BLOOD (ROUTINE X 2)     Status: None   Collection Time    11/04/13  5:13 PM      Result Value Range Status   Specimen Description BLOOD LEFT HAND   Final   Special Requests BOTTLES DRAWN AEROBIC AND ANAEROBIC [place]  Final   Culture  Setup Time     Final   Value: 11/05/2013 20:50     Performed at [facility]   Culture     Final   Value: STAPHYLOCOCCUS AUREUS     Note: RIFAMPIN AND GENTAMICIN SHOULD NOT BE USED AS SINGLE DRUGS FOR TREATMENT OF STAPH INFECTIONS.     Note: Gram Stain Report Called to,Read Back By and Verified With: HAWKINS M AT 0729A ON [id number] BY THOMPSON S     Performed at [facility]   Report Status PENDING   Incomplete  CULTURE, BLOOD (ROUTINE X 2)     Status: None   Collection Time    11/04/13  5:19 PM      Result Value Range Status   Specimen Description BLOOD RIGHT ARM   Final   Special Requests BOTTLES DRAWN AEROBIC AND ANAEROBIC 10CC EACH   Final   Culture  Setup Time     Final   Value: 11/05/2013 20:50     Performed at [facility]   Culture     Final   Value: STAPHYLOCOCCUS AUREUS     Note: Gram Stain Report Called to,Read Back By and Verified With: HAWKINS M AT 0729A ON 020115 BY THOMPSON S     Performed at [facility]   Report Status PENDING   Incomplete  CULTURE, BLOOD (ROUTINE X 2)     Status: None   Collection Time    11/06/13 12:28 PM      Result Value Range Status   Specimen Description BLOOD RIGHT HAND   Final   Special Requests     Final   Value: BOTTLES DRAWN AEROBIC AND ANAEROBIC 8CC EACH BOTTLE   Culture PENDING   Incomplete   Report Status PENDING   Incomplete    Dg Chest 2 View  11/04/2013   CLINICAL DATA:  Weakness  EXAM: CHEST - 2 VIEW  COMPARISON:  05/09/2013  FINDINGS: Stable cardiomegaly. Lungs clear. Stable left subclavian AICD. Orthopedic anchor in the right humeral head. . No effusion.  IMPRESSION: Stable cardiomegaly and postop change.  No acute disease.   Electronically Signed   By: Arne Cleveland M.D.   On: 11/04/2013 09:43   Impression: Uncontrolled type 2 DM Active Problems:   Hyperglycemia Sepsis with SA Plan: Pt with systemic infection and severe hyperglycemia, no DKA.  The hyponatremia seems to be multifactorial including fluid loss from vomiting and hyperglycemia. She will need continued  support with antiemetics. Acutely, she will need to continue on insulin drip for  at least 24 hours to stabilize her glycemia. She will be put on non DKA protocol with normal saline instead of 1/2 normal saline as a IV fluid of choice. Aim glycemic range to be 140-180 in this criticaly ill patient. She will need CMP  Every 4 hours to monitor Na level.  I will reassess for conversion to basal/bolus insulin in AM.    Thecla Forgione 11/06/2013, 1:30 PM

## 2013-11-06 NOTE — Plan of Care (Signed)
Dr. Everette Rank notified of BS 515 on Lab Glucose.  He gave verbal orders to transfer to ICU.  Informed him that ICU has no beds at this time, but will transfer when can.  No new orders at this time.  Charge informed of need for transfer.

## 2013-11-06 NOTE — Plan of Care (Signed)
Glucose Lab of 556 called to Dr. Everette Rank and confirmed he only ordered 20U novolog (one time) and not to give additional 15U on sliding scale.  Asked to have Lab Glucose run in 1 hour and if still 400 or higher, have transferred to ICU and begin on Glucomanda protocol.  Will place orders.

## 2013-11-06 NOTE — Plan of Care (Signed)
Pt resp 25+/min, SPO2 levels remain 97 or above - increased [place] to 4L and encouraged pt to slow rate and relax. Pt was able to slow to 20 but still looks and feels in distress.

## 2013-11-06 NOTE — Progress Notes (Signed)
INITIAL NUTRITION ASSESSMENT  DOCUMENTATION CODES Per approved criteria  -Obesity Unspecified   INTERVENTION: Resource Breeze po TID, each supplement provides 250 kcal and 9 grams of protein  NUTRITION DIAGNOSIS: Inadequate oral intake related to increased nutrition needs for wound healing, poor appetite as evidenced by stage II pressure ulcer on foot, poor appetite x 5 days.   Goal: Pt will meet >90% of estimated nutritional needs  Monitor:  PO intake, diet advancement, skin assessments, I/O's, weight changes, labs, I/O's, changes in status  Reason for Assessment: MST=3  68 y.o. female  Admitting Dx: <principal problem not specified>  ASSESSMENT: Pt admitted for FTT and hyperglycemia. Pt and family unavailable art time of RD visit.  Chart reveals poor appetite x 5 days. No PO data currently available.  Wt hx reveals a 3.2% wt gain x 1 year and 4.6% wt loss x 3 months, neither of which are clinically significant.  Also noted stage II pressure ulcer on lt foot. Unable to diagnose malnutrition at this time, however, pt is at risk, due to increased nutrition needs for wound healing and poor po intake.   Height: Ht Readings from Last 1 Encounters:  11/05/13 5\' 1"  (1.549 m)    Weight: Wt Readings from Last 1 Encounters:  11/06/13 158 lb 11.7 oz (72 kg)    Ideal Body Weight: 105#  % Ideal Body Weight: 150%  Wt Readings from Last 10 Encounters:  11/06/13 158 lb 11.7 oz (72 kg)  09/19/13 158 lb (71.668 kg)  06/12/13 160 lb 12 oz (72.916 kg)  05/09/13 162 lb 0.6 oz (73.5 kg)  05/09/13 162 lb 0.6 oz (73.5 kg)  05/01/13 165 lb (74.844 kg)  03/27/13 165 lb (74.844 kg)  03/06/13 184 lb (83.462 kg)  01/17/13 184 lb 4 oz (83.575 kg)  11/21/12 154 lb (69.854 kg)    Usual Body Weight: 160#  % Usual Body Weight: 99%  BMI:  Body mass index is 30.01 kg/(m^2). Meets criteria for obesity, class I.  Estimated Nutritional Needs: Kcal: 2160-2520 daily Protein: 90-108 grams  daily Fluid: 2.2-2.5 L daily  Skin: stage II pressure ulcer on lt foot  Diet Order: Clear Liquid  EDUCATION NEEDS: -Education not appropriate at this time   Intake/Output Summary (Last 24 hours) at 11/06/13 1613 Last data filed at 11/06/13 1423  Gross per 24 hour  Intake 1566.67 ml  Output      0 ml  Net 1566.67 ml    Last BM: 11/04/13   Labs:   Recent Labs Lab 11/04/13 0832 11/06/13 0637 11/06/13 0817 11/06/13 0941  NA 125* 120*  --   --   K 3.9 3.4*  --   --   CL 90* 89*  --   --   CO2 20 16*  --   --   BUN 52* 64*  --   --   CREATININE 2.08* 2.95*  --   --   CALCIUM 8.5 7.3*  --   --   GLUCOSE 505* 522* 556* 515*    CBG (last 3)   Recent Labs  11/06/13 1341 11/06/13 1502 11/06/13 1557  GLUCAP 291* 255* 211*    Scheduled Meds: . amLODipine  10 mg Oral BID  . carvedilol  25 mg Oral BID WC  .  ceFAZolin (ANCEF) IV  2 g Intravenous Q12H  . [START ON 11/07/2013] Chlorhexidine Gluconate Cloth  6 each Topical Q0600  . digoxin  0.125 mg Oral Daily  . enoxaparin (LOVENOX) injection  30 mg  Subcutaneous Q24H  . furosemide  20 mg Oral Daily  . insulin regular  0-10 Units Intravenous TID WC  . losartan  50 mg Oral BID  . mupirocin ointment  1 application Nasal BID  . rifampin  300 mg Oral Q12H  . sodium chloride  10-40 mL Intracatheter Q12H  . vancomycin  750 mg Intravenous Q24H    Continuous Infusions: . sodium chloride    . sodium chloride Stopped (11/06/13 1607)  . dextrose 5 % and 0.45% NaCl 20 mL/hr at 11/06/13 1604  . insulin (NOVOLIN-R) infusion 3.2 Units/hr (11/06/13 1245)    Past Medical History  Diagnosis Date  . Diabetes mellitus, type 2   . Essential hypertension, benign   . Chronic systolic heart failure   . Coronary atherosclerosis of native coronary artery     Nonobstructive  . Nonischemic cardiomyopathy     LVEF 20%  . PAD (peripheral artery disease)     Left foot gangrene  . Anxiety   . Depression   . ICD (implantable cardiac  defibrillator) in place     Past Surgical History  Procedure Laterality Date  . Abdominal hysterectomy    . Left shoulder surgery      Rotator Cuff  . Breast lumpectomy      Bil  . Cardiac defibrillator placement      Medtronic [id number]   . Exploratory laparotomy with lysis of adhesions    . Bladder surgery  2011 BENIGN MASS REMOVED FROM BLADDER  . Eye surgery  BILATERAL CATARACTS REMOVAL  10 YEARS AGO PER PATIENT  . Pr vein bypass graft,aorto-fem-pop  05-2011    Left popliteal- PTA graft   . Viterectomy  11/10/2012    OD   Dr Zigmund Daniel  . 25 gauge pars plana vitrectomy with 20 gauge mvr port for macular hole Right 11/10/2012    Procedure: 32 GAUGE PARS PLANA VITRECTOMY WITH 20 GAUGE MVR PORT FOR MACULAR HOLE;  Surgeon: Hayden Pedro, MD;  Location: [place];  Service: Ophthalmology;  Laterality: Right;  . Gas insertion Right 11/10/2012    Procedure: INSERTION OF GAS;  Surgeon: Hayden Pedro, MD;  Location: [place];  Service: Ophthalmology;  Laterality: Right;  C3F8  . Serum patch Right 11/10/2012    Procedure: SERUM PATCH;  Surgeon: Hayden Pedro, MD;  Location: [place];  Service: Ophthalmology;  Laterality: Right;  . 25 gauge pars plana vitrectomy with 20 gauge mvr port for macular hole Right 05/09/2013    Procedure: 20 GAUGE PARS PLANA VITRECTOMY WITH 20 GAUGE MVR PORT FOR MACULAR HOLE;  Surgeon: Hayden Pedro, MD;  Location: [place];  Service: Ophthalmology;  Laterality: Right;  . Gas/fluid exchange Right 05/09/2013    Procedure: GAS/FLUID EXCHANGE;  Surgeon: Hayden Pedro, MD;  Location: [place];  Service: Ophthalmology;  Laterality: Right;  C3F8   . Serum patch Right 05/09/2013    Procedure: SERUM PATCH;  Surgeon: Hayden Pedro, MD;  Location: [place];  Service: Ophthalmology;  Laterality: Right;  . Laser photo ablation Right 05/09/2013    Procedure: LASER PHOTO ABLATION;  Surgeon: Hayden Pedro, MD;  Location: [place];  Service: Ophthalmology;  Laterality: Right;  Endolaser   . Membrane peel  Right 05/09/2013    Procedure: MEMBRANE PEEL;  Surgeon: Hayden Pedro, MD;  Location: [place];  Service: Ophthalmology;  Laterality: Right;    Khush Pasion A. Jimmye Norman, RD, LDN Pager: [phone]

## 2013-11-06 NOTE — Progress Notes (Signed)
      [facility] Antimicrobial Management Team Staphylococcus aureus bacteremia   Staphylococcus aureus bacteremia (SAB) is associated with a high rate of complications and mortality.  Specific aspects of clinical management are critical to optimizing the outcome of patients with SAB.  Therefore, the [facility] Antimicrobial Management Team [facility]) has initiated an intervention aimed at improving the management of SAB at [facility].  To do so, Infectious Diseases physicians are providing an evidence-based consult for the management of all patients with SAB.     Yes No Comments  Perform follow-up blood cultures (even if the patient is afebrile) to ensure clearance of bacteremia [x]  []  New set of blood cultures ordered on 11/06/13  Remove vascular catheter and obtain follow-up blood cultures after the removal of the catheter []  []    Perform echocardiography to evaluate for endocarditis (transthoracic ECHO is 40-50% sensitive, TEE is > 90% sensitive) [x]  []  Will start with TTE. But will also need order TEE to rule out endocarditis due to AICD Please keep in mind, that neither test can definitively EXCLUDE endocarditis, and that should clinical suspicion remain high for endocarditis the patient should then still be treated with an "endocarditis" duration of therapy = 6 weeks  Consult electrophysiologist to evaluate implanted cardiac device (pacemaker, ICD) []  []  Pending TEE results.  Ensure source control []  []  Have all abscesses been drained effectively? Have deep seeded infections (septic joints or osteomyelitis) had appropriate surgical debridement?  Investigate for "metastatic" sites of infection []  []  Does the patient have ANY symptom or physical exam finding that would suggest a deeper infection (back or neck pain that may be suggestive of vertebral osteomyelitis or epidural abscess, muscle pain that could be a symptom of pyomyositis)?  Keep in mind that for deep seeded infections MRI imaging with  contrast is preferred rather than other often insensitive tests such as plain x-rays, especially early in a patient's presentation.  Change antibiotic therapy to added cefazolin plus rifampin in addition to vanco [x]  []  Beta-lactam antibiotics are preferred for MSSA due to higher cure rates.   If on Vancomycin, goal trough should be 15 - 20 mcg/mL  Estimated duration of IV antibiotic therapy:  4-6 wks pending work up []  []  Consult case management for probably prolonged outpatient IV antibiotic therapy    Spoke to Dr. Everette Rank for recs on management of staph aureus bacteremia. Agreed with addn of cefazolin and rifampin. Will also convey info to endocrinologist given concern for hyperglycemia  Finola [facility] for Infectious Diseases [phone]

## 2013-11-06 NOTE — Progress Notes (Signed)
Inpatient Diabetes Program Recommendations  AACE/ADA: New Consensus Statement on Inpatient Glycemic Control (2013)  Target Ranges:  Prepandial:   less than 140 mg/dL      Peak postprandial:   less than 180 mg/dL (1-2 hours)      Critically ill patients:  140 - 180 mg/dL   Reason for Assessment:  Note CBG greater than 500 mg/dL this morning.  CO2 is 16 on labs this AM.  Spoke with RN.  He states that patient will be transferred to ICU and started on IV insulin.  Note new orders.  Will follow.   Diabetes history: History states Type 2 Outpatient Diabetes medications: Lantus 45 units daily, Humalog correction Current orders for Inpatient glycemic control: Patient is being placed on IV insulin.  Will follow.  Thanks, Adah Perl, RN, BC-ADM Inpatient Diabetes Coordinator Pager [phone]

## 2013-11-06 NOTE — Progress Notes (Signed)
ANTIBIOTIC CONSULT NOTE  Pharmacy Consult for Vancomycin & Ancef Indication: bacteremia  Allergies  Allergen Reactions  . Hydrocodone-Acetaminophen Other (See Comments)    hallucinations    Patient Measurements: Height: 5\' 1"  (154.9 cm) Weight: 158 lb 11.7 oz (72 kg) IBW/kg (Calculated) : 47.8  Vital Signs: Temp: 101.8 F (38.8 C) (02/02 1232) Temp src: Oral (02/02 1232) BP: 132/60 mmHg (02/02 0831) Intake/Output from previous day: 02/01 0701 - 02/02 0700 In: 1776.7 [P.O.:360; I.V.:1416.7] Out: -  Intake/Output from this shift:    Labs:  Recent Labs  11/04/13 0832 11/06/13 0637  WBC 18.3*  --   HGB 9.8*  --   PLT 152  --   CREATININE 2.08* 2.95*   Estimated Creatinine Clearance: 16.8 ml/min (by C-G formula based on Cr of 2.95). No results found for this basename: Letta Median, VANCORANDOM, GENTTROUGH, GENTPEAK, GENTRANDOM, TOBRATROUGH, TOBRAPEAK, TOBRARND, AMIKACINPEAK, AMIKACINTROU, AMIKACIN,  in the last 72 hours   Microbiology: Recent Results (from the past 720 hour(s))  URINE CULTURE     Status: None   Collection Time    11/04/13 11:50 AM      Result Value Range Status   Specimen Description URINE, RANDOM   Final   Special Requests Normal   Final   Culture  Setup Time     Final   Value: 11/04/2013 21:17     Performed at [place]     Final   Value: 65,000 COLONIES/ML     Performed at [facility]   Culture     Final   Value: STAPHYLOCOCCUS AUREUS     Note: RIFAMPIN AND GENTAMICIN SHOULD NOT BE USED AS SINGLE DRUGS FOR TREATMENT OF STAPH INFECTIONS.     Performed at [facility]   Report Status PENDING   Incomplete  STOOL CULTURE     Status: None   Collection Time    11/04/13  3:55 PM      Result Value Range Status   Specimen Description STOOL   Final   Special Requests NONE   Final   Culture     Final   Value: Culture reincubated for better growth     Performed at [facility]   Report Status PENDING   Incomplete  CLOSTRIDIUM DIFFICILE BY PCR     Status: None   Collection Time    11/04/13  3:55 PM      Result Value Range Status   C difficile by pcr NEGATIVE  NEGATIVE Final  CULTURE, BLOOD (ROUTINE X 2)     Status: None   Collection Time    11/04/13  5:13 PM      Result Value Range Status   Specimen Description BLOOD LEFT HAND   Final   Special Requests BOTTLES DRAWN AEROBIC AND ANAEROBIC 10CC EACH   Final   Culture  Setup Time     Final   Value: 11/05/2013 20:50     Performed at [facility]   Culture     Final   Value: STAPHYLOCOCCUS AUREUS     Note: RIFAMPIN AND GENTAMICIN SHOULD NOT BE USED AS SINGLE DRUGS FOR TREATMENT OF STAPH INFECTIONS.     Note: Gram Stain Report Called to,Read Back By and Verified With: HAWKINS M AT 0729A ON 020115 BY THOMPSON S     Performed at [facility]   Report Status PENDING   Incomplete  CULTURE, BLOOD (ROUTINE X 2)     Status: None  Collection Time    11/04/13  5:19 PM      Result Value Range Status   Specimen Description BLOOD RIGHT ARM   Final   Special Requests BOTTLES DRAWN AEROBIC AND ANAEROBIC 10CC EACH   Final   Culture  Setup Time     Final   Value: 11/05/2013 20:50     Performed at [facility]   Culture     Final   Value: STAPHYLOCOCCUS AUREUS     Note: Gram Stain Report Called to,Read Back By and Verified With: Tito Dine AT 0729A ON 020115 BY THOMPSON S     Performed at [facility]   Report Status PENDING   Incomplete    Medical History: Past Medical History  Diagnosis Date  . Diabetes mellitus, type 2   . Essential hypertension, benign   . Chronic systolic heart failure   . Coronary atherosclerosis of native coronary artery     Nonobstructive  . Nonischemic cardiomyopathy     LVEF 20%  . PAD (peripheral artery disease)     Left foot gangrene  . Anxiety   . Depression   . ICD (implantable cardiac defibrillator) in place     Assessment: 68yo female with GPC  bacteremia.  Ancef and Rifampin are being added to Vancomycin pending final cx data.   She remains febrile with elevated WBC.  HR & respiratory rate are normal, but blood glucose elevated and patient has been started in insulin drip.  Repeat blood cx & TTE/TEE are pending.  Acute on chronic renal insufficiency noted.  Antibiotic doses adjusted.    Vancomycin 2/1 >> Ancef 2/2>> Rifampin 2/2>>  Goal of Therapy:  Vancomycin trough level 15-20 mcg/ml  Plan:  Continue Vancomycin 750mg  IV q24hrs Start Ancef 2gm IV Q12h Check trough at steady state Monitor labs, renal fxn, and cultures  Biagio Borg 11/06/2013,12:39 PM

## 2013-11-07 ENCOUNTER — Inpatient Hospital Stay (HOSPITAL_COMMUNITY): Payer: Medicare Other

## 2013-11-07 ENCOUNTER — Other Ambulatory Visit: Payer: Self-pay | Admitting: Cardiovascular Disease

## 2013-11-07 DIAGNOSIS — I330 Acute and subacute infective endocarditis: Secondary | ICD-10-CM

## 2013-11-07 DIAGNOSIS — I319 Disease of pericardium, unspecified: Secondary | ICD-10-CM

## 2013-11-07 DIAGNOSIS — R7881 Bacteremia: Secondary | ICD-10-CM

## 2013-11-07 LAB — COMPREHENSIVE METABOLIC PANEL
ALBUMIN: 1.9 g/dL — AB (ref 3.5–5.2)
ALT: 10 U/L (ref 0–35)
AST: 23 U/L (ref 0–37)
Alkaline Phosphatase: 107 U/L (ref 39–117)
BILIRUBIN TOTAL: 1 mg/dL (ref 0.3–1.2)
BUN: 68 mg/dL — AB (ref 6–23)
CHLORIDE: 93 meq/L — AB (ref 96–112)
CO2: 17 mEq/L — ABNORMAL LOW (ref 19–32)
CREATININE: 3.33 mg/dL — AB (ref 0.50–1.10)
Calcium: 7.7 mg/dL — ABNORMAL LOW (ref 8.4–10.5)
GFR calc non Af Amer: 13 mL/min — ABNORMAL LOW (ref >90)
GFR, EST AFRICAN AMERICAN: 15 mL/min — AB (ref >90)
Glucose, Bld: 122 mg/dL — ABNORMAL HIGH (ref 70–99)
Potassium: 3.1 mEq/L — ABNORMAL LOW (ref 3.7–5.3)
Sodium: 127 mEq/L — ABNORMAL LOW (ref 137–147)
TOTAL PROTEIN: 6 g/dL (ref 6.0–8.3)

## 2013-11-07 LAB — GLUCOSE, CAPILLARY
GLUCOSE-CAPILLARY: 165 mg/dL — AB (ref 70–99)
GLUCOSE-CAPILLARY: 144 mg/dL — AB (ref 70–99)
GLUCOSE-CAPILLARY: 133 mg/dL — AB (ref 70–99)
GLUCOSE-CAPILLARY: 179 mg/dL — AB (ref 70–99)
GLUCOSE-CAPILLARY: 162 mg/dL — AB (ref 70–99)
GLUCOSE-CAPILLARY: 177 mg/dL — AB (ref 70–99)
Glucose-Capillary: 131 mg/dL — ABNORMAL HIGH (ref 70–99)
Glucose-Capillary: 133 mg/dL — ABNORMAL HIGH (ref 70–99)
Glucose-Capillary: 146 mg/dL — ABNORMAL HIGH (ref 70–99)
Glucose-Capillary: 148 mg/dL — ABNORMAL HIGH (ref 70–99)
Glucose-Capillary: 150 mg/dL — ABNORMAL HIGH (ref 70–99)
Glucose-Capillary: 153 mg/dL — ABNORMAL HIGH (ref 70–99)
Glucose-Capillary: 162 mg/dL — ABNORMAL HIGH (ref 70–99)
Glucose-Capillary: 162 mg/dL — ABNORMAL HIGH (ref 70–99)
Glucose-Capillary: 167 mg/dL — ABNORMAL HIGH (ref 70–99)
Glucose-Capillary: 172 mg/dL — ABNORMAL HIGH (ref 70–99)
Glucose-Capillary: 178 mg/dL — ABNORMAL HIGH (ref 70–99)
Glucose-Capillary: 180 mg/dL — ABNORMAL HIGH (ref 70–99)
Glucose-Capillary: 183 mg/dL — ABNORMAL HIGH (ref 70–99)
Glucose-Capillary: 189 mg/dL — ABNORMAL HIGH (ref 70–99)
Glucose-Capillary: 202 mg/dL — ABNORMAL HIGH (ref 70–99)
Glucose-Capillary: 206 mg/dL — ABNORMAL HIGH (ref 70–99)
Glucose-Capillary: 226 mg/dL — ABNORMAL HIGH (ref 70–99)

## 2013-11-07 LAB — CULTURE, BLOOD (ROUTINE X 2)

## 2013-11-07 LAB — CBC WITH DIFFERENTIAL/PLATELET
Basophils Absolute: 0 10*3/uL (ref 0.0–0.1)
Basophils Relative: 0 % (ref 0–1)
EOS PCT: 0 % (ref 0–5)
Eosinophils Absolute: 0 10*3/uL (ref 0.0–0.7)
HCT: 22.7 % — ABNORMAL LOW (ref 36.0–46.0)
Hemoglobin: 7.9 g/dL — ABNORMAL LOW (ref 12.0–15.0)
LYMPHS PCT: 5 % — AB (ref 12–46)
Lymphs Abs: 1.2 10*3/uL (ref 0.7–4.0)
MCH: 28.6 pg (ref 26.0–34.0)
MCHC: 34.8 g/dL (ref 30.0–36.0)
MCV: 82.2 fL (ref 78.0–100.0)
MONOS PCT: 12 % (ref 3–12)
Monocytes Absolute: 2.9 10*3/uL — ABNORMAL HIGH (ref 0.1–1.0)
NEUTROS PCT: 83 % — AB (ref 43–77)
Neutro Abs: 20.3 10*3/uL — ABNORMAL HIGH (ref 1.7–7.7)
PLATELETS: 86 10*3/uL — AB (ref 150–400)
RBC: 2.76 MIL/uL — AB (ref 3.87–5.11)
RDW: 13.2 % (ref 11.5–15.5)
WBC: 24.4 10*3/uL — AB (ref 4.0–10.5)

## 2013-11-07 LAB — PROTIME-INR
INR: 1.27 (ref 0.00–1.49)
Prothrombin Time: 15.6 seconds — ABNORMAL HIGH (ref 11.6–15.2)

## 2013-11-07 MED ORDER — VANCOMYCIN HCL IN DEXTROSE 1-5 GM/200ML-% IV SOLN
1000.0000 mg | INTRAVENOUS | Status: DC
  Administered 2013-11-08 – 2013-11-12 (×3): 1000 mg via INTRAVENOUS
  Filled 2013-11-07 (×4): qty 200

## 2013-11-07 MED ORDER — TRAMADOL HCL 50 MG PO TABS
50.0000 mg | ORAL_TABLET | ORAL | Status: DC | PRN
  Administered 2013-11-07 – 2013-11-13 (×5): 50 mg via ORAL
  Filled 2013-11-07 (×5): qty 1

## 2013-11-07 MED ORDER — POTASSIUM CHLORIDE 10 MEQ/100ML IV SOLN
10.0000 meq | INTRAVENOUS | Status: AC
  Administered 2013-11-07 (×3): 10 meq via INTRAVENOUS
  Filled 2013-11-07: qty 100

## 2013-11-07 MED ORDER — SODIUM CHLORIDE 0.9 % IV BOLUS (SEPSIS)
500.0000 mL | Freq: Once | INTRAVENOUS | Status: AC
  Administered 2013-11-07: 500 mL via INTRAVENOUS

## 2013-11-07 NOTE — Progress Notes (Signed)
Spoke to Dr. Morrison Old regarding CBC findings with leukocytosis, anemia, and thrombocytopenia. Concern it is related to underlying MRSA infection. TEE on hold, awaiting to see if attending will transfer patient to [facility].

## 2013-11-07 NOTE — Progress Notes (Signed)
Subjective: F/U for uncontrolled type 2  DM. Pt is still not eating well. Glycemia is controlled by insulin drip. Pt is feeling better. She is diagnosed with MRSA sepsis and put on contact isolation in ICU. Objective: Pt looks a little better. No distress. On contact isolation due to MRSA. Vital signs in last 24 hours: Filed Vitals:   11/07/13 0300 11/07/13 0400 11/07/13 0500 11/07/13 0600  BP: 119/41 122/39 107/56 107/40  Pulse:  58    Temp:  97.5 F (36.4 C)    TempSrc:  Axillary    Resp: 26 21 23 26   Height:      Weight:   75.8 kg (167 lb 1.7 oz)   SpO2:  100%      Intake/Output Summary (Last 24 hours) at 11/07/13 0745 Last data filed at 11/06/13 1423  Gross per 24 hour  Intake     30 ml  Output      0 ml  Net     30 ml   Weight change: 3.7 kg (8 lb 2.5 oz)   HEENT: coated tongue,  Neck" distended neck veins ,  Chest : poor air entry , bilaterally.  CVS: distant heat sounds, no m/g  Abd: soft, non tender  Ext; Pitting trace edema on BLE.  Skin: dry skin, poor hygiene    Lab Results: Basic Metabolic Panel:  Recent Labs  11/06/13 0637  11/06/13 0941 11/07/13 0430  NA 120*  --   --  127*  K 3.4*  --   --  3.1*  CL 89*  --   --  93*  CO2 16*  --   --  17*  GLUCOSE 522*  < > 515* 122*  BUN 64*  --   --  68*  CREATININE 2.95*  --   --  3.33*  CALCIUM 7.3*  --   --  7.7*  < > = values in this interval not displayed. Liver Function Tests:  Recent Labs  11/04/13 0832 11/07/13 0430  AST 27 23  ALT 12 10  ALKPHOS 108 107  BILITOT 0.4 1.0  PROT 7.2 6.0  ALBUMIN 2.7* 1.9*    Recent Labs  11/04/13 0832  LIPASE 8*   CBC:  Recent Labs  11/04/13 0832  WBC 18.3*  NEUTROABS 15.5*  HGB 9.8*  HCT 27.8*  MCV 84.8  PLT 152   Cardiac Enzymes: No results found for this basename: CKTOTAL, CKMB, CKMBINDEX, TROPONINI,  in the last 72 hours BNP: No components found with this basename: POCBNP,  D-Dimer: No results found for this basename: DDIMER,  in  the last 72 hours CBG:  Recent Labs  11/07/13 0105 11/07/13 0204 11/07/13 0302 11/07/13 0418 11/07/13 0509 11/07/13 0628  GLUCAP 206* 165* 183* 131* 133* 150*   Hemoglobin A1C:  10.5%.  Recent Labs  11/06/13 1400  HGBA1C 10.5*   Fasting Lipid Panel: No results found for this basename: CHOL, HDL, LDLCALC, TRIG, CHOLHDL, LDLDIRECT,  in the last 72 hours Thyroid Function Tests: No results found for this basename: TSH, T4TOTAL, FREET3, T3FREE, THYROIDAB,  in the last 72 hours Anemia Panel: No results found for this basename: VITAMINB12, FOLATE, FERRITIN, TIBC, IRON, RETICCTPCT,  in the last 72 hours  Studies/Results: Dg Chest Port 1 View  11/06/2013   CLINICAL DATA:  Status post PICC line placement  EXAM: PORTABLE CHEST - 1 VIEW  COMPARISON:  11/04/2013  FINDINGS: Cardiac shadow is stable. A defibrillator is again seen. A new right-sided PICC line is  noted with the catheter tip in the mid right atrium. The lungs are clear. No acute bony abnormality is noted.  IMPRESSION: New right PICC line with the tip in the mid right atrium.   Electronically Signed   By: Inez Catalina M.D.   On: 11/06/2013 15:02     Medications:  Scheduled Meds: . amLODipine  10 mg Oral BID  . carvedilol  25 mg Oral BID WC  . Chlorhexidine Gluconate Cloth  6 each Topical Q0600  . digoxin  0.125 mg Oral Daily  . enoxaparin (LOVENOX) injection  30 mg Subcutaneous Q24H  . feeding supplement (RESOURCE BREEZE)  1 Container Oral TID BM  . furosemide  20 mg Oral Daily  . insulin regular  0-10 Units Intravenous TID WC  . losartan  50 mg Oral BID  . mupirocin ointment  1 application Nasal BID  . potassium chloride  10 mEq Intravenous Q1 Hr x 3  . rifampin  300 mg Oral Q12H  . sodium chloride  10-40 mL Intracatheter Q12H  . vancomycin  750 mg Intravenous Q24H   Continuous Infusions: . sodium chloride    . sodium chloride Stopped (11/06/13 1607)  . dextrose 5 % and 0.45% NaCl 20 mL/hr at 11/06/13 1604  .  insulin (NOVOLIN-R) infusion 3.2 Units/hr (11/06/13 1245)   PRN Meds:.acetaminophen, dextrose, dextrose, loperamide, nitroGLYCERIN, sodium chloride   Impression:  Uncontrolled type 2 DM  Active Problems:  Hyperglycemia  Sepsis with SA  Plan:  She has had chronically uncontrolled type 2 DM with a1c of 10.5%. Her culture now confirms MRSA. Glycemic control is achieved with insulin drip. Hyponatremia is improving to 127 meq/l.  Pt is still not eating well.  She will need continued  Intensive insulin therapy while recovering from systemic infection.  Continue on insulin drip for at least today , and until she s able to tolerate oral intake.  Aim glycemic range to be 140-180 in this criticaly ill patient. I will reassess for conversion to basal/bolus insulin in AM.    LOS: 3 days   Jaylanni Eltringham 11/07/2013, 7:45 AM

## 2013-11-07 NOTE — Progress Notes (Signed)
ANTIBIOTIC CONSULT NOTE  Pharmacy Consult for Vancomycin Indication: bacteremia  Allergies  Allergen Reactions  . Hydrocodone-Acetaminophen Other (See Comments)    hallucinations    Patient Measurements: Height: 5\' 1"  (154.9 cm) Weight: 167 lb 1.7 oz (75.8 kg) IBW/kg (Calculated) : 47.8  Vital Signs: Temp: 97.5 F (36.4 C) (02/03 0400) Temp src: Axillary (02/03 0400) BP: 107/40 mmHg (02/03 0600) Pulse Rate: 58 (02/03 0400) Intake/Output from previous day: 02/02 0701 - 02/03 0700 In: 30 [I.V.:30] Out: -  Intake/Output from this shift:    Labs:  Recent Labs  11/04/13 0832 11/06/13 0637 11/07/13 0430  WBC 18.3*  --   --   HGB 9.8*  --   --   PLT 152  --   --   CREATININE 2.08* 2.95* 3.33*   Estimated Creatinine Clearance: 15.3 ml/min (by C-G formula based on Cr of 3.33). No results found for this basename: Letta Median, VANCORANDOM, GENTTROUGH, GENTPEAK, GENTRANDOM, TOBRATROUGH, TOBRAPEAK, TOBRARND, AMIKACINPEAK, AMIKACINTROU, AMIKACIN,  in the last 72 hours   Microbiology: Recent Results (from the past 720 hour(s))  URINE CULTURE     Status: None   Collection Time    11/04/13 11:50 AM      Result Value Range Status   Specimen Description URINE, CLEAN CATCH   Final   Special Requests NONE   Final   Culture  Setup Time     Final   Value: 11/04/2013 21:17     Performed at [facility]     Final   Value: 65,000 COLONIES/ML     Performed at [facility]   Culture     Final   Value: METHICILLIN RESISTANT STAPHYLOCOCCUS AUREUS     Note: RIFAMPIN AND GENTAMICIN SHOULD NOT BE USED AS SINGLE DRUGS FOR TREATMENT OF STAPH INFECTIONS. CRITICAL RESULT CALLED TO, READ BACK BY AND VERIFIED WITH: JESSICA C@3 :05PM ON 11/06/13 BY DANTS     Performed at [facility]   Report Status 11/06/2013 FINAL   Final   Organism ID, Bacteria METHICILLIN RESISTANT STAPHYLOCOCCUS AUREUS   Final  STOOL CULTURE     Status: None   Collection  Time    11/04/13  3:55 PM      Result Value Range Status   Specimen Description STOOL   Final   Special Requests NONE   Final   Culture     Final   Value: Culture reincubated for better growth     Performed at [facility]   Report Status PENDING   Incomplete  CLOSTRIDIUM DIFFICILE BY PCR     Status: None   Collection Time    11/04/13  3:55 PM      Result Value Range Status   C difficile by pcr NEGATIVE  NEGATIVE Final  CULTURE, BLOOD (ROUTINE X 2)     Status: None   Collection Time    11/04/13  5:13 PM      Result Value Range Status   Specimen Description BLOOD LEFT HAND   Final   Special Requests BOTTLES DRAWN AEROBIC AND ANAEROBIC 10CC EACH   Final   Culture  Setup Time     Final   Value: 11/05/2013 20:50     Performed at [facility]   Culture     Final   Value: STAPHYLOCOCCUS AUREUS     Note: RIFAMPIN AND GENTAMICIN SHOULD NOT BE USED AS SINGLE DRUGS FOR TREATMENT OF STAPH INFECTIONS.     Note: Gram  Stain Report Called to,Read Back By and Verified With: HAWKINS M AT 0729A ON [id number] BY THOMPSON S     Performed at [facility]   Report Status PENDING   Incomplete  CULTURE, BLOOD (ROUTINE X 2)     Status: None   Collection Time    11/04/13  5:19 PM      Result Value Range Status   Specimen Description BLOOD RIGHT ARM   Final   Special Requests BOTTLES DRAWN AEROBIC AND ANAEROBIC 10CC EACH   Final   Culture  Setup Time     Final   Value: 11/05/2013 20:50     Performed at [facility]   Culture     Final   Value: STAPHYLOCOCCUS AUREUS     Note: Gram Stain Report Called to,Read Back By and Verified With: HAWKINS M AT 0729A ON [id number] BY THOMPSON S     Performed at [facility]   Report Status PENDING   Incomplete  CULTURE, BLOOD (ROUTINE X 2)     Status: None   Collection Time    11/06/13 12:28 PM      Result Value Range Status   Specimen Description BLOOD RIGHT HAND   Final   Special Requests     Final   Value: BOTTLES DRAWN  AEROBIC AND ANAEROBIC 8CC EACH BOTTLE   Culture PENDING   Incomplete   Report Status PENDING   Incomplete  MRSA PCR SCREENING     Status: Abnormal   Collection Time    11/06/13 12:30 PM      Result Value Range Status   MRSA by PCR POSITIVE (*) NEGATIVE Final   Comment:            The GeneXpert MRSA Assay (FDA     approved for NASAL specimens     only), is one component of a     comprehensive MRSA colonization     surveillance program. It is not     intended to diagnose MRSA     infection nor to guide or     monitor treatment for     MRSA infections.     RESULT CALLED TO, READ BACK BY AND VERIFIED WITH:     CRUISE,J. AT [id number] ON 11/06/2013 BY BAUGHAM,M.    Medical History: Past Medical History  Diagnosis Date  . Diabetes mellitus, type 2   . Essential hypertension, benign   . Chronic systolic heart failure   . Coronary atherosclerosis of native coronary artery     Nonobstructive  . Nonischemic cardiomyopathy     LVEF 20%  . PAD (peripheral artery disease)     Left foot gangrene  . Anxiety   . Depression   . ICD (implantable cardiac defibrillator) in place     Assessment: 68yo female with GPC bacteremia.  Urine cx +MRSA; Blood cx sensitivities are pending although would suspect this is MRSA as well.  She has been afebrile overnight & blood glucose is improved on insulin drip.  Repeat blood cx & TTE/TEE are pending.  Patient's Scr continues to increase.  I/O not adequately recorded.  Will empirically adjust Vancomycin dose.   Vancomycin 2/1 >> Ancef 2/2>>2/2 Rifampin 2/2>>  Goal of Therapy:  Vancomycin trough level 15-20 mcg/ml  Plan:  Decrease Vancomycin 1000mg  IV q48hrs Check trough at steady state Monitor labs, renal fxn, and cultures  Biagio Borg 11/07/2013,8:31 AM

## 2013-11-07 NOTE — Progress Notes (Signed)
*  PRELIMINARY RESULTS* Echocardiogram 2D Echocardiogram has been performed.  [place], [place] 11/07/2013, 3:05 PM

## 2013-11-07 NOTE — Progress Notes (Signed)
UR chart review completed.  

## 2013-11-07 NOTE — Progress Notes (Signed)
[name], [name]            ACCOUNT NO.:  [id number]  MEDICAL RECORD NO.:  [id number]  LOCATION:                                 FACILITY:  PHYSICIAN:  Vandana Haman G. Everette Rank, MD   DATE OF BIRTH:  04-05-1946  DATE OF PROCEDURE:  11/06/2013 DATE OF DISCHARGE:                                PROGRESS NOTE   SUBJECTIVE:  This patient is more alert today.  She has had less fever, vomiting and diarrhea.  She did have a positive blood culture, gram- positive cocci.  Sugar more stable, now ranging from 257-373.  OBJECTIVE:  VITAL SIGNS:  Blood pressure 128/43, respirations 20, pulse 75, temp 99.5. HEENT:  Eyes, PERRLA.  TM negative.  Oropharynx benign. NECK:  Supple.  No JVD or thyroid abnormalities. LUNGS:  Clear to P and A. HEART:  Regular rhythm. ABDOMEN:  No palpable organs or masses.  No organomegaly.  ASSESSMENT AND PLAN: 1. The patient has diabetes with hyperglycemia.  We will continue     current insulin dosage. 2. Gram-positive cocci on blood culture to start IV vancomycin. 3. Chronic systolic heart failure, renal failure stage III.  We will     continue furosemide.  We will obtain chemistries today and continue     current regimen.     Seaton Hofmann G. Everette Rank, MD     AGM/MEDQ  D:  11/06/2013  T:  11/06/2013  Job:  [id number]

## 2013-11-07 NOTE — Progress Notes (Addendum)
In the last 24hrs: - Urine cx MRSA per RN report as well as blood cx + MRSA. - new PICC line placed on 2/2 prior to documentation of bacteremia clearance - patient remains afebrile, no recent CBC - patient having decreased UOP, and worsening Cr on daily labs  Plan:  Spoke to cardiology on call who will review 2D echo / and have spoken regarding for TEE, which he will perform is 2D echo is negative for vegetation. Key is to determine if the patient has cardiac device related endocarditis.  Pharmacy managing vancomycin dosing according renal function  Please see other recs below:     [facility] Staphylococcus aureus bacteremia   Staphylococcus aureus bacteremia (SAB) is associated with a high rate of complications and mortality.  Specific aspects of clinical management are critical to optimizing the outcome of patients with SAB.  Therefore, the [facility] Antimicrobial Management Team [facility]) has initiated an intervention aimed at improving the management of SAB at [name].  To do so, Infectious Diseases physicians are providing an evidence-based consult for the management of all patients with SAB.     Yes No Comments  Perform follow-up blood cultures (even if the patient is afebrile) to ensure clearance of bacteremia [x]  []  Repeat blood cx drawn on 2/2. Awaiting to see if bacteremia has cleared  Remove vascular catheter and obtain follow-up blood cultures after the removal of the catheter []  [x]  New picc line placed on 2/2 prior to documentation of bacteremia. If blood cx on 2/2 are +;the current picc line needs to be pulled  Perform echocardiography to evaluate for endocarditis (transthoracic ECHO is 40-50% sensitive, TEE is > 90% sensitive) []  [x]  Please get TEE to evaluate for MRSA endocarditis with cardiac device  Please keep in mind, that neither test can definitively EXCLUDE endocarditis, and that should clinical suspicion remain high for endocarditis  the patient should then still be treated with an "endocarditis" duration of therapy = 6 weeks  Consult electrophysiologist to evaluate implanted cardiac device (pacemaker, ICD) []  []    Ensure source control []  []  Have all abscesses been drained effectively? Have deep seeded infections (septic joints or osteomyelitis) had appropriate surgical debridement?  Investigate for "metastatic" sites of infection []  []  Does the patient have ANY symptom or physical exam finding that would suggest a deeper infection (back or neck pain that may be suggestive of vertebral osteomyelitis or epidural abscess, muscle pain that could be a symptom of pyomyositis)?  Keep in mind that for deep seeded infections MRI imaging with contrast is preferred rather than other often insensitive tests such as plain x-rays, especially early in a patient's presentation.  Change antibiotic therapy to : Vancomycin plus rifampin [x]  []  Beta-lactam antibiotics are preferred for MSSA due to higher cure rates.   If on Vancomycin, goal trough should be 15 - 20 mcg/mL  Estimated duration of IV antibiotic therapy:  6 wks for  Complicated/disseminated MRSA bacteremia [x]  []  Consult case management for probably prolonged outpatient IV antibiotic therapy   Daegan Arizmendi B. [facility] for Infectious Diseases [phone]

## 2013-11-08 ENCOUNTER — Inpatient Hospital Stay (HOSPITAL_COMMUNITY): Payer: Medicare Other

## 2013-11-08 DIAGNOSIS — R7881 Bacteremia: Secondary | ICD-10-CM

## 2013-11-08 DIAGNOSIS — L97409 Non-pressure chronic ulcer of unspecified heel and midfoot with unspecified severity: Secondary | ICD-10-CM

## 2013-11-08 DIAGNOSIS — R509 Fever, unspecified: Secondary | ICD-10-CM

## 2013-11-08 DIAGNOSIS — D649 Anemia, unspecified: Secondary | ICD-10-CM | POA: Diagnosis present

## 2013-11-08 DIAGNOSIS — B9562 Methicillin resistant Staphylococcus aureus infection as the cause of diseases classified elsewhere: Secondary | ICD-10-CM | POA: Diagnosis present

## 2013-11-08 DIAGNOSIS — M1990 Unspecified osteoarthritis, unspecified site: Secondary | ICD-10-CM | POA: Diagnosis present

## 2013-11-08 DIAGNOSIS — J9600 Acute respiratory failure, unspecified whether with hypoxia or hypercapnia: Secondary | ICD-10-CM

## 2013-11-08 DIAGNOSIS — T827XXA Infection and inflammatory reaction due to other cardiac and vascular devices, implants and grafts, initial encounter: Secondary | ICD-10-CM

## 2013-11-08 DIAGNOSIS — R7309 Other abnormal glucose: Secondary | ICD-10-CM

## 2013-11-08 DIAGNOSIS — E119 Type 2 diabetes mellitus without complications: Secondary | ICD-10-CM | POA: Diagnosis present

## 2013-11-08 DIAGNOSIS — A4902 Methicillin resistant Staphylococcus aureus infection, unspecified site: Secondary | ICD-10-CM

## 2013-11-08 DIAGNOSIS — L97529 Non-pressure chronic ulcer of other part of left foot with unspecified severity: Secondary | ICD-10-CM | POA: Diagnosis present

## 2013-11-08 DIAGNOSIS — I428 Other cardiomyopathies: Secondary | ICD-10-CM

## 2013-11-08 LAB — GLUCOSE, CAPILLARY
GLUCOSE-CAPILLARY: 149 mg/dL — AB (ref 70–99)
GLUCOSE-CAPILLARY: 140 mg/dL — AB (ref 70–99)
GLUCOSE-CAPILLARY: 168 mg/dL — AB (ref 70–99)
Glucose-Capillary: 129 mg/dL — ABNORMAL HIGH (ref 70–99)
Glucose-Capillary: 132 mg/dL — ABNORMAL HIGH (ref 70–99)
Glucose-Capillary: 144 mg/dL — ABNORMAL HIGH (ref 70–99)
Glucose-Capillary: 151 mg/dL — ABNORMAL HIGH (ref 70–99)
Glucose-Capillary: 156 mg/dL — ABNORMAL HIGH (ref 70–99)
Glucose-Capillary: 158 mg/dL — ABNORMAL HIGH (ref 70–99)
Glucose-Capillary: 169 mg/dL — ABNORMAL HIGH (ref 70–99)
Glucose-Capillary: 179 mg/dL — ABNORMAL HIGH (ref 70–99)
Glucose-Capillary: 184 mg/dL — ABNORMAL HIGH (ref 70–99)
Glucose-Capillary: 188 mg/dL — ABNORMAL HIGH (ref 70–99)
Glucose-Capillary: 247 mg/dL — ABNORMAL HIGH (ref 70–99)

## 2013-11-08 LAB — BASIC METABOLIC PANEL
BUN: 76 mg/dL — AB (ref 6–23)
BUN: 76 mg/dL — AB (ref 6–23)
BUN: 77 mg/dL — AB (ref 6–23)
BUN: 78 mg/dL — AB (ref 6–23)
CALCIUM: 7.3 mg/dL — AB (ref 8.4–10.5)
CALCIUM: 7.7 mg/dL — AB (ref 8.4–10.5)
CHLORIDE: 95 meq/L — AB (ref 96–112)
CO2: 15 mEq/L — ABNORMAL LOW (ref 19–32)
CO2: 12 meq/L — AB (ref 19–32)
CO2: 13 mEq/L — ABNORMAL LOW (ref 19–32)
CO2: 13 meq/L — AB (ref 19–32)
CREATININE: 3.31 mg/dL — AB (ref 0.50–1.10)
CREATININE: 3.36 mg/dL — AB (ref 0.50–1.10)
Calcium: 7.6 mg/dL — ABNORMAL LOW (ref 8.4–10.5)
Calcium: 7.5 mg/dL — ABNORMAL LOW (ref 8.4–10.5)
Chloride: 95 mEq/L — ABNORMAL LOW (ref 96–112)
Chloride: 94 mEq/L — ABNORMAL LOW (ref 96–112)
Chloride: 96 mEq/L (ref 96–112)
Creatinine, Ser: 3.84 mg/dL — ABNORMAL HIGH (ref 0.50–1.10)
Creatinine, Ser: 3.73 mg/dL — ABNORMAL HIGH (ref 0.50–1.10)
GFR calc Af Amer: 13 mL/min — ABNORMAL LOW (ref >90)
GFR calc Af Amer: 15 mL/min — ABNORMAL LOW (ref >90)
GFR calc Af Amer: 16 mL/min — ABNORMAL LOW (ref >90)
GFR calc non Af Amer: 11 mL/min — ABNORMAL LOW (ref >90)
GFR calc non Af Amer: 12 mL/min — ABNORMAL LOW (ref >90)
GFR, EST AFRICAN AMERICAN: 13 mL/min — AB (ref >90)
GFR, EST NON AFRICAN AMERICAN: 13 mL/min — AB (ref >90)
GFR, EST NON AFRICAN AMERICAN: 13 mL/min — AB (ref >90)
GLUCOSE: 163 mg/dL — AB (ref 70–99)
GLUCOSE: 187 mg/dL — AB (ref 70–99)
GLUCOSE: 188 mg/dL — AB (ref 70–99)
Glucose, Bld: 135 mg/dL — ABNORMAL HIGH (ref 70–99)
POTASSIUM: 3.8 meq/L (ref 3.7–5.3)
Potassium: 3.4 mEq/L — ABNORMAL LOW (ref 3.7–5.3)
Potassium: 3.5 mEq/L — ABNORMAL LOW (ref 3.7–5.3)
Potassium: 4 mEq/L (ref 3.7–5.3)
SODIUM: 125 meq/L — AB (ref 137–147)
SODIUM: 126 meq/L — AB (ref 137–147)
Sodium: 123 mEq/L — ABNORMAL LOW (ref 137–147)
Sodium: 123 mEq/L — ABNORMAL LOW (ref 137–147)

## 2013-11-08 LAB — CBC WITH DIFFERENTIAL/PLATELET
BASOS ABS: 0 10*3/uL (ref 0.0–0.1)
BASOS PCT: 0 % (ref 0–1)
EOS ABS: 0.1 10*3/uL (ref 0.0–0.7)
Eosinophils Relative: 0 % (ref 0–5)
HEMATOCRIT: 22.7 % — AB (ref 36.0–46.0)
Hemoglobin: 8 g/dL — ABNORMAL LOW (ref 12.0–15.0)
Lymphocytes Relative: 8 % — ABNORMAL LOW (ref 12–46)
Lymphs Abs: 2.2 10*3/uL (ref 0.7–4.0)
MCH: 29.3 pg (ref 26.0–34.0)
MCHC: 35.2 g/dL (ref 30.0–36.0)
MCV: 83.2 fL (ref 78.0–100.0)
MONO ABS: 2.3 10*3/uL — AB (ref 0.1–1.0)
Monocytes Relative: 9 % (ref 3–12)
NEUTROS ABS: 22.2 10*3/uL — AB (ref 1.7–7.7)
Neutrophils Relative %: 83 % — ABNORMAL HIGH (ref 43–77)
Platelets: 88 10*3/uL — ABNORMAL LOW (ref 150–400)
RBC: 2.73 MIL/uL — ABNORMAL LOW (ref 3.87–5.11)
RDW: 14.5 % (ref 11.5–15.5)
WBC Morphology: INCREASED
WBC: 26.7 10*3/uL — ABNORMAL HIGH (ref 4.0–10.5)

## 2013-11-08 LAB — HEMOGLOBIN AND HEMATOCRIT, BLOOD
HEMATOCRIT: 21.5 % — AB (ref 36.0–46.0)
HEMOGLOBIN: 7.7 g/dL — AB (ref 12.0–15.0)

## 2013-11-08 LAB — STOOL CULTURE

## 2013-11-08 LAB — PREPARE RBC (CROSSMATCH)

## 2013-11-08 MED ORDER — INSULIN ASPART 100 UNIT/ML ~~LOC~~ SOLN
0.0000 [IU] | Freq: Every day | SUBCUTANEOUS | Status: DC
  Administered 2013-11-08: 2 [IU] via SUBCUTANEOUS

## 2013-11-08 MED ORDER — CARVEDILOL 3.125 MG PO TABS
3.1250 mg | ORAL_TABLET | Freq: Two times a day (BID) | ORAL | Status: DC
  Filled 2013-11-08 (×2): qty 1

## 2013-11-08 MED ORDER — DEXTROSE-NACL 5-0.9 % IV SOLN
INTRAVENOUS | Status: DC
  Administered 2013-11-08: 11:00:00 via INTRAVENOUS

## 2013-11-08 MED ORDER — ASPIRIN EC 81 MG PO TBEC
81.0000 mg | DELAYED_RELEASE_TABLET | Freq: Every day | ORAL | Status: DC
  Filled 2013-11-08: qty 1

## 2013-11-08 MED ORDER — INSULIN GLARGINE 100 UNIT/ML ~~LOC~~ SOLN
5.0000 [IU] | Freq: Every day | SUBCUTANEOUS | Status: DC
  Administered 2013-11-08: 5 [IU] via SUBCUTANEOUS
  Filled 2013-11-08: qty 0.05

## 2013-11-08 MED ORDER — INSULIN ASPART 100 UNIT/ML ~~LOC~~ SOLN
0.0000 [IU] | Freq: Three times a day (TID) | SUBCUTANEOUS | Status: DC
  Administered 2013-11-09: 5 [IU] via SUBCUTANEOUS

## 2013-11-08 NOTE — Progress Notes (Signed)
Report called and given to [place], Therapist, sports. Patient being transported via [facility] to [facility]. Patient alert, oriented and in stable condition at the time of transport. Patient's children are at the beside and will be transporting patients belongings.

## 2013-11-08 NOTE — Progress Notes (Addendum)
[facility] PROGRESS NOTE  [name] [id number] DOB: [birth year] DOA: 11/04/2013 PCP: [name], MD  Assessment/Plan:   MRSA bacteremia - ID on board and currently managing. Patient is on Rifampin and Vancomycin - Consulted Cardiology given history of implantable defibrillator and need for TEE    Hyperglycemia/Type 2 diabetes mellitus/HONK - Continue insulin Gtt, reassess BMP and when Anion gap closes will plan on transitioning to SQ insulin regimen. - Obtain BMP q 2 hours while on insulin gtt.  Active Problems:   HYPERLIPIDEMIA   CORONARY ATHEROSCLEROSIS NATIVE CORONARY ARTERY  Nonischemic cardiomyopathy/Chronic systolic heart failure - Will decreased B blocker given soft blood pressures and Bradycardia. - Pt on digoxin - Patient has dry mucous membranes and recent HONK as well as elevated BUN/Creatinine ratio. As such will discontinue lasix. - Last EF reported at 45% 11/07/13    Biventricular implantable cardioverter-defibrillator in situ - Patient denies any firing of defibrillator - consulted cardiology for further evaluation and recommendations.    Normocytic anemia - Will hold aspirin given anemia and thrombocytopenia - Will transfuse 1 unit of PRBC given hgb of addendum < 8.0 with patient history of cardiomyopathy.    DJD (degenerative joint disease) - acetaminophen prn  Code Status: full Family Communication: No family at bedside Disposition Plan: Pending improvement in condition and specialist recommendations.   Consultants:  Cardiology  ID  Procedures:  TEE Pending  Echocardiogram  Antibiotics:  Rifampin  Vancomycin  HPI/Subjective: Pt has no new complaints. Denies her defibrillator firing off.  Objective: Filed Vitals:   11/08/13 1240  BP: 105/85  Pulse: 50  Temp: 98.4 F (36.9 C)  Resp: 20    Intake/Output Summary (Last 24 hours) at 11/08/13 1335 Last data filed at 11/08/13 0600  Gross per 24 hour  Intake  2113.37 ml  Output    450 ml  Net 1663.37 ml   Filed Weights   11/06/13 0500 11/07/13 0500 11/08/13 0500  Weight: 72 kg (158 lb 11.7 oz) 75.8 kg (167 lb 1.7 oz) 77.4 kg (170 lb 10.2 oz)    Exam:   General:  Pt in NAD, alert and awake  Cardiovascular: RRR, no MRG  Respiratory: CTA BL, no wheezes  Abdomen: soft, nt, nd  Musculoskeletal: no cyanosis or clubbing   Data Reviewed: Basic Metabolic Panel:  Recent Labs Lab 11/04/13 0832 11/06/13 0637 11/06/13 0817 11/06/13 0941 11/07/13 0430 11/08/13 0433  NA 125* 120*  --   --  127* 125*  K 3.9 3.4*  --   --  3.1* 3.4*  CL 90* 89*  --   --  93* 95*  CO2 20 16*  --   --  17* 15*  GLUCOSE 505* 522* 556* 515* 122* 135*  BUN 52* 64*  --   --  68* 76*  CREATININE 2.08* 2.95*  --   --  3.33* 3.84*  CALCIUM 8.5 7.3*  --   --  7.7* 7.6*   Liver Function Tests:  Recent Labs Lab 11/04/13 0832 11/07/13 0430  AST 27 23  ALT 12 10  ALKPHOS 108 107  BILITOT 0.4 1.0  PROT 7.2 6.0  ALBUMIN 2.7* 1.9*    Recent Labs Lab 11/04/13 0832  LIPASE 8*   No results found for this basename: AMMONIA,  in the last 168 hours CBC:  Recent Labs Lab 11/04/13 0832 11/07/13 1420 11/08/13 0433 11/08/13 0719  WBC 18.3* 24.4* 26.7*  --   NEUTROABS 15.5* 20.3* 22.2*  --   HGB 9.8* 7.9*  8.0* 7.7*  HCT 27.8* 22.7* 22.7* 21.5*  MCV 84.8 82.2 83.2  --   PLT 152 86* 88*  --    Cardiac Enzymes: No results found for this basename: CKTOTAL, CKMB, CKMBINDEX, TROPONINI,  in the last 168 hours BNP (last 3 results) No results found for this basename: PROBNP,  in the last 8760 hours CBG:  Recent Labs Lab 11/08/13 0522 11/08/13 0639 11/08/13 0741 11/08/13 0851 11/08/13 1009  GLUCAP 129* 132* 140* 158* 184*    Recent Results (from the past 240 hour(s))  URINE CULTURE     Status: None   Collection Time    11/04/13 11:50 AM      Result Value Range Status   Specimen Description URINE, CLEAN CATCH   Final   Special Requests NONE    Final   Culture  Setup Time     Final   Value: 11/04/2013 21:17     Performed at [facility]     Final   Value: 65,000 COLONIES/ML     Performed at [facility]   Culture     Final   Value: METHICILLIN RESISTANT STAPHYLOCOCCUS AUREUS     Note: RIFAMPIN AND GENTAMICIN SHOULD NOT BE USED AS SINGLE DRUGS FOR TREATMENT OF STAPH INFECTIONS. CRITICAL RESULT CALLED TO, READ BACK BY AND VERIFIED WITH: JESSICA C@3 :05PM ON 11/06/13 BY DANTS     Performed at [facility]   Report Status 11/06/2013 FINAL   Final   Organism ID, Bacteria METHICILLIN RESISTANT STAPHYLOCOCCUS AUREUS   Final  STOOL CULTURE     Status: None   Collection Time    11/04/13  3:55 PM      Result Value Range Status   Specimen Description STOOL   Final   Special Requests NONE   Final   Culture     Final   Value: NO SALMONELLA, SHIGELLA, CAMPYLOBACTER, YERSINIA, OR E.COLI 0157:H7 ISOLATED     Performed at [facility]   Report Status 11/08/2013 FINAL   Final  CLOSTRIDIUM DIFFICILE BY PCR     Status: None   Collection Time    11/04/13  3:55 PM      Result Value Range Status   C difficile by pcr NEGATIVE  NEGATIVE Final  CULTURE, BLOOD (ROUTINE X 2)     Status: None   Collection Time    11/04/13  5:13 PM      Result Value Range Status   Specimen Description BLOOD LEFT HAND   Final   Special Requests BOTTLES DRAWN AEROBIC AND ANAEROBIC 10CC EACH   Final   Culture  Setup Time     Final   Value: 11/05/2013 20:50     Performed at [facility]   Culture     Final   Value: METHICILLIN RESISTANT STAPHYLOCOCCUS AUREUS     Note: RIFAMPIN AND GENTAMICIN SHOULD NOT BE USED AS SINGLE DRUGS FOR TREATMENT OF STAPH INFECTIONS. CRITICAL RESULT CALLED TO, READ BACK BY AND VERIFIED WITH: JESSICA CHILDRESS@0913  ON [id number] BY [facility]     Note: Gram Stain Report Called to,Read Back By and Verified With: HAWKINS M AT 0729A ON [id number] BY THOMPSON S Performed at [facility]      Performed at [facility]   Report Status 11/07/2013 FINAL   Final   Organism ID, Bacteria METHICILLIN RESISTANT STAPHYLOCOCCUS AUREUS   Final  CULTURE, BLOOD (ROUTINE X 2)     Status: None   Collection  Time    11/04/13  5:19 PM      Result Value Range Status   Specimen Description BLOOD RIGHT ARM   Final   Special Requests BOTTLES DRAWN AEROBIC AND ANAEROBIC 10CC EACH   Final   Culture  Setup Time     Final   Value: 11/05/2013 20:50     Performed at [facility]   Culture     Final   Value: STAPHYLOCOCCUS AUREUS     Note: SUSCEPTIBILITIES PERFORMED ON PREVIOUS CULTURE WITHIN THE LAST 5 DAYS.     Note: Gram Stain Report Called to,Read Back By and Verified With: HAWKINS M AT 0729A ON [id number] BY THOMPSON S Performed at [facility]     Performed at [facility]   Report Status 11/07/2013 FINAL   Final  CULTURE, BLOOD (ROUTINE X 2)     Status: None   Collection Time    11/06/13 12:28 PM      Result Value Range Status   Specimen Description BLOOD RIGHT HAND   Final   Special Requests     Final   Value: BOTTLES DRAWN AEROBIC AND ANAEROBIC 8CC EACH BOTTLE   Culture NO GROWTH 2 DAYS   Final   Report Status PENDING   Incomplete  MRSA PCR SCREENING     Status: Abnormal   Collection Time    11/06/13 12:30 PM      Result Value Range Status   MRSA by PCR POSITIVE (*) NEGATIVE Final   Comment:            The GeneXpert MRSA Assay (FDA     approved for NASAL specimens     only), is one component of a     comprehensive MRSA colonization     surveillance program. It is not     intended to diagnose MRSA     infection nor to guide or     monitor treatment for     MRSA infections.     RESULT CALLED TO, READ BACK BY AND VERIFIED WITH:     CRUISE,J. AT 1458 ON 11/06/2013 BY BAUGHAM,M.  CULTURE, BLOOD (ROUTINE X 2)     Status: None   Collection Time    11/06/13  1:30 PM      Result Value Range Status   Specimen Description BLOOD RIGHT HAND   Final   Special  Requests     Final   Value: BOTTLES DRAWN AEROBIC AND ANAEROBIC AEB=6CC ANA=4CC   Culture  Setup Time     Final   Value: 11/08/2013 01:26     Performed at [facility]   Culture     Final   Value: GRAM POSITIVE COCCI IN CLUSTERS     Note: Performed at [facility] Gram Stain Report Called to,Read Back By and Verified With: [place] [id number] ON 11/07/13 BY Elza Rafter     Performed at [facility]   Report Status PENDING   Incomplete     Studies: Dg Chest Port 1 View  11/06/2013   CLINICAL DATA:  Status post PICC line placement  EXAM: PORTABLE CHEST - 1 VIEW  COMPARISON:  11/04/2013  FINDINGS: Cardiac shadow is stable. A defibrillator is again seen. A new right-sided PICC line is noted with the catheter tip in the mid right atrium. The lungs are clear. No acute bony abnormality is noted.  IMPRESSION: New right PICC line with the tip in the mid right atrium.  Electronically Signed   By: Inez Catalina M.D.   On: 11/06/2013 15:02   Dg Knee Right Port  11/07/2013   CLINICAL DATA:  Pain and swelling  EXAM: PORTABLE RIGHT KNEE - 1-2 VIEW  COMPARISON:  None.  FINDINGS: Frontal and lateral views were obtained. There is a small joint effusion. No fracture or dislocation. There is mild generalized joint space narrowing. No erosive change or bony destruction. There is extensive arterial vascular calcification.  IMPRESSION: Multifocal osteoarthritic change. Small joint effusion. No fracture or bony destruction. There is extensive arterial vascular calcification.   Electronically Signed   By: Lowella Grip M.D.   On: 11/07/2013 08:29    Scheduled Meds: . carvedilol  25 mg Oral BID WC  . Chlorhexidine Gluconate Cloth  6 each Topical Q0600  . digoxin  0.125 mg Oral Daily  . feeding supplement (RESOURCE BREEZE)  1 Container Oral TID BM  . furosemide  20 mg Oral Daily  . insulin regular  0-10 Units Intravenous TID WC  . mupirocin ointment  1 application Nasal BID  . rifampin  300 mg  Oral Q12H  . sodium chloride  10-40 mL Intracatheter Q12H  . vancomycin  1,000 mg Intravenous Q48H   Continuous Infusions: . sodium chloride    . dextrose 5 % and 0.9% NaCl 100 mL/hr at 11/08/13 1048  . insulin (NOVOLIN-R) infusion 1.4 Units/hr (11/08/13 [id number])    Principal Problem:   MRSA bacteremia Active Problems:   HYPERLIPIDEMIA   CORONARY ATHEROSCLEROSIS NATIVE CORONARY ARTERY   Nonischemic cardiomyopathy   Chronic systolic heart failure   PAD (peripheral artery disease)   Biventricular implantable cardioverter-defibrillator in situ   Atherosclerosis of native arteries of the extremities with intermittent claudication   Hyperglycemia   Type 2 diabetes mellitus   Normocytic anemia   DJD (degenerative joint disease)   Time spent: > 35 minutes    Velvet Bathe  [facility] Pager [phone]. If 7PM-7AM, please contact night-coverage at [email or web address], password [facility] 11/08/2013, 1:35 PM  LOS: 4 days

## 2013-11-08 NOTE — Progress Notes (Signed)
Patient ID: [name], female   DOB: Mar 02, 1946, 68 y.o.   MRN: [id number] EP followup  Patient seen and examined. She has MRSA bacteremia. Will hopefully (schedule permitting) proceed with ICD system extraction tomorrow morning. She will need TEE done in OR prior to extraction to confirm the absence of a vegetation larger than 25 mm on the ICD/pm lead. I have reviewed the indication for the procedure with the patient and her family and they wish to proceed. Prognosis without extraction is poor and the longer the device is left in place, the poorer the prognosis.   Mikle Bosworth.D.

## 2013-11-08 NOTE — Progress Notes (Signed)
Received reported from [place] from [facility]. Will await patient's arrival

## 2013-11-08 NOTE — Progress Notes (Signed)
BP 109/36  Pulse 51  Temp(Src) 98.3 F (36.8 C) (Oral)  Resp 21  Ht 5\' 1"  (1.549 m)  Wt 77.4 kg (170 lb 10.2 oz)  BMI 32.26 kg/m2  SpO2 100% Patient Active Problem List   Diagnosis Date Noted  . Hyperglycemia 11/04/2013  . Macular hole 10/12/2012  . Proliferative diabetic retinopathy 10/12/2012  . Atherosclerosis of native arteries of the extremities with intermittent claudication 03/22/2012  . Biventricular implantable cardioverter-defibrillator in situ 05/27/2011  . PAD (peripheral artery disease) 04/30/2011  . HYPERLIPIDEMIA 12/31/2009  . CORONARY ATHEROSCLEROSIS NATIVE CORONARY ARTERY 12/31/2009  . UNSPECIFIED SECONDARY CARDIOMYOPATHY 12/31/2009  . Chronic systolic heart failure 0000000  . Essential hypertension, benign 12/05/2009  . LEFT BUNDLE BRANCH BLOCK 12/05/2009   68 year old that came in to admit and further fevers, nonketotic hyperglycemic state was found to have MRSA on her urine and and 2 out of 2 blood cultures positive for MRSA, she also has a biventricular defibrillator in situ. Currently on an insulin drip and on Vanc and Rifampin. Infectious disease Dr. Graylon Good was consulted and recommended transfer her to come. Will come to the step down unit on an insulin drip and will probably need cardiology for TEE.

## 2013-11-08 NOTE — Progress Notes (Addendum)
NURSING PROGRESS NOTE  HATSUMI TRAUGHBER [id number] Admission Data: 11/08/2013 12:54 PM Attending Provider: [name], MD [id number], MD Code Status: Full   [name] is a 68 y.o. female patient admitted from ED:  -No acute distress noted.  -No complaints of shortness of breath.  -No complaints of chest pain.   Cardiac Monitoring: Box #2C9 in place. Cardiac monitor yields:Paced  Blood pressure 129/33, pulse 51, temperature 98.3 F (36.8 C), temperature source Oral, resp. rate 22, height 5\' 1"  (1.549 m), weight 77.4 kg (170 lb 10.2 oz), SpO2 100.00%.   IV Fluids:  IV in place, occlusive dsg intact without redness, Double lumen PICC in upper arm right, condition patent and no redness D5W/0.9 NaCl.   Allergies:  Hydrocodone-acetaminophen  Past Medical History:   has a past medical history of Diabetes mellitus, type 2; Essential hypertension, benign; Chronic systolic heart failure; Coronary atherosclerosis of native coronary artery; Nonischemic cardiomyopathy; PAD (peripheral artery disease); Anxiety; Depression; and ICD (implantable cardiac defibrillator) in place.  Past Surgical History:   has past surgical history that includes Abdominal hysterectomy; Left shoulder surgery; Breast lumpectomy; Cardiac defibrillator placement; Exploratory laparotomy with lysis of adhesions; Bladder surgery (2011 [place]); Eye surgery (BILATERAL CATARACTS REMOVAL  10 YEARS AGO PER PATIENT); vein bypass graft,aorto-fem-pop [id number]); Viterectomy (11/10/2012); 25 gauge pars plana vitrectomy with 20 gauge mvr port for macular hole (Right, 11/10/2012); Gas insertion (Right, 11/10/2012); Serum patch (Right, 11/10/2012); 25 gauge pars plana vitrectomy with 20 gauge mvr port for macular hole (Right, 05/09/2013); Gas/fluid exchange (Right, 05/09/2013); Serum patch (Right, 05/09/2013); Laser photo ablation (Right, 05/09/2013); and Membrane peel (Right, 05/09/2013).  Social History:    reports that she has never smoked. She has never used smokeless tobacco. She reports that she does not drink alcohol or use illicit drugs.  Skin: Patient has an ulcer to her left great toe. A wet to dry dsg is applied  Patient/Family orientated to room. Information packet given to patient/family. Admission inpatient armband information verified with patient/family to include name and date of birth and placed on patient arm. Side rails up x 2, fall assessment and education completed with patient/family. Patient/family able to verbalize understanding of risk associated with falls and verbalized understanding to call for assistance before getting out of bed. Call light within reach. Patient/family able to voice and demonstrate understanding of unit orientation instructions.    Will continue to evaluate and treat per MD orders.

## 2013-11-08 NOTE — Progress Notes (Addendum)
Lucina Mellow transferring Rn from [facility] didn't not administer 10am meds despite patient not leaving her unit after 11am.  Transferring RN did not begin blood transfusion though order was written 7am.

## 2013-11-08 NOTE — Progress Notes (Signed)
Subjective: F/U for uncontrolled type 2  DM. Remains in ICU .  Pt is still not eating well. Glycemia is controlled by insulin drip.  She is diagnosed with MRSA sepsis and put on contact isolation in ICU. She is being considered for transfer to [facility] . Objective: Pt looks sick and weaker . No distress. On contact isolation due to MRSA. Vital signs in last 24 hours: Filed Vitals:   11/08/13 0500 11/08/13 0600 11/08/13 0700 11/08/13 0730  BP: 85/38 110/48 109/36   Pulse:   51   Temp:    98.3 F (36.8 C)  TempSrc:    Oral  Resp: 26 21 21    Height:      Weight: 77.4 kg (170 lb 10.2 oz)     SpO2:   100%     Intake/Output Summary (Last 24 hours) at 11/08/13 0824 Last data filed at 11/08/13 0600  Gross per 24 hour  Intake 2113.37 ml  Output    450 ml  Net 1663.37 ml   Weight change: 1.6 kg (3 lb 8.4 oz)   HEENT: coated tongue,  Neck" distended neck veins ,  Chest : poor air entry , bilaterally.  CVS: distant heat sounds, no m/g  Abd: soft, non tender  Ext; Pitting trace edema on BLE.  Skin: dry skin, poor hygiene   Lab Results: Basic Metabolic Panel:  Recent Labs  11/07/13 0430 11/08/13 0433  NA 127* 125*  K 3.1* 3.4*  CL 93* 95*  CO2 17* 15*  GLUCOSE 122* 135*  BUN 68* 76*  CREATININE 3.33* 3.84*  CALCIUM 7.7* 7.6*   Liver Function Tests:  Recent Labs  11/07/13 0430  AST 23  ALT 10  ALKPHOS 107  BILITOT 1.0  PROT 6.0  ALBUMIN 1.9*   No results found for this basename: LIPASE, AMYLASE,  in the last 72 hours CBC:  Recent Labs  11/07/13 1420 11/08/13 0433 11/08/13 0719  WBC 24.4* 26.7*  --   NEUTROABS 20.3* 22.2*  --   HGB 7.9* 8.0* 7.7*  HCT 22.7* 22.7* 21.5*  MCV 82.2 83.2  --   PLT 86* 88*  --      CBG:  Recent Labs  11/08/13 0158 11/08/13 0256 11/08/13 0404 11/08/13 0522 11/08/13 0639 11/08/13 0741  GLUCAP 151* 149* 144* 129* 132* 140*   Hemoglobin A1C:  10.5%.  Recent Labs  11/06/13 1400  HGBA1C 10.5*     Studies/Results: Dg Chest Port 1 View  11/06/2013   CLINICAL DATA:  Status post PICC line placement  EXAM: PORTABLE CHEST - 1 VIEW  COMPARISON:  11/04/2013  FINDINGS: Cardiac shadow is stable. A defibrillator is again seen. A new right-sided PICC line is noted with the catheter tip in the mid right atrium. The lungs are clear. No acute bony abnormality is noted.  IMPRESSION: New right PICC line with the tip in the mid right atrium.   Electronically Signed   By: Inez Catalina M.D.   On: 11/06/2013 15:02   Dg Knee Right Port  11/07/2013   CLINICAL DATA:  Pain and swelling  EXAM: PORTABLE RIGHT KNEE - 1-2 VIEW  COMPARISON:  None.  FINDINGS: Frontal and lateral views were obtained. There is a small joint effusion. No fracture or dislocation. There is mild generalized joint space narrowing. No erosive change or bony destruction. There is extensive arterial vascular calcification.  IMPRESSION: Multifocal osteoarthritic change. Small joint effusion. No fracture or bony destruction. There is extensive arterial vascular calcification.   Electronically  Signed   By: Lowella Grip M.D.   On: 11/07/2013 08:29     Medications:  Scheduled Meds: . amLODipine  10 mg Oral BID  . carvedilol  25 mg Oral BID WC  . Chlorhexidine Gluconate Cloth  6 each Topical Q0600  . digoxin  0.125 mg Oral Daily  . feeding supplement (RESOURCE BREEZE)  1 Container Oral TID BM  . furosemide  20 mg Oral Daily  . insulin regular  0-10 Units Intravenous TID WC  . losartan  50 mg Oral BID  . mupirocin ointment  1 application Nasal BID  . rifampin  300 mg Oral Q12H  . sodium chloride  10-40 mL Intracatheter Q12H  . vancomycin  1,000 mg Intravenous Q48H   Continuous Infusions: . sodium chloride    . sodium chloride Stopped (11/06/13 1607)  . dextrose 5 % and 0.45% NaCl 125 mL/hr at 11/08/13 0456  . insulin (NOVOLIN-R) infusion 1.4 Units/hr (11/08/13 0643)   PRN Meds:.acetaminophen, dextrose, dextrose, loperamide,  nitroGLYCERIN, sodium chloride, traMADol   Impression:  Uncontrolled type 2 DM  Hyperglycemia  MRSA Sepsis    Plan:  She has had chronically uncontrolled type 2 DM with a1c of 10.5%. Her culture now confirms MRSA.  After initial course of severe hyperglycemia, glycemic control is achieved with insulin drip. Hyponatremia is still an issue with 125 meq/l ( was  127 meq/l yesterday).   Restrict free water to less than 800 cc/24 hours.  Pt is too sick for oral intake. Change her IVF to 5% Dextrose in  0.9% NaCl  173ml/hour for now with close monitoring of I's/O's.   She will  Continue to need  Intensive insulin therapy while recovering from systemic infection.  Continue on insulin drip for now. Aim glycemic range to be 140-180 in this criticaly ill patient. She will need assessment to convert to basal/bolus insulin if she starts to consume oral calories.    LOS: 4 days   Toribio Seiber 11/08/2013, 8:24 AM

## 2013-11-08 NOTE — Progress Notes (Signed)
[name], [name]            ACCOUNT NO.:  [id number]  MEDICAL RECORD NO.:  [id number]  LOCATION:  IC01                          FACILITY:  [facility]  PHYSICIAN:  Hollie Wojahn G. Everette Rank, MD   DATE OF BIRTH:  12-Apr-1946  DATE OF PROCEDURE: DATE OF DISCHARGE:                                PROGRESS NOTE   SUBJECTIVE:  The patient had to be moved down to intensive care yesterday in order to treat severe hyperglycemia with Glucommander protocol.  She had culture, gram-positive cocci in blood and MRSA from urine, was placed on Ancef, rifampin, and vancomycin protocol.  She also was seen in consultation by Dr. Dorris Fetch, endocrinologist.  Her sugars have responded to IV insulin and range from 133 to 150 now.  She does have a low serum potassium at 3.1, and low serum sodium at 127 which has improved since.  This morning she is complaining more of a right knee and foot.  She has chronically infected foot on the left and apparently had some injury to her right knee in movement from home by EMS.  OBJECTIVE:  VITAL SIGNS:  Blood pressure 107/56, respirations 23, pulse 58, temp 97.5. HEENT:  Eyes, PERRLA.  TMs, negative.  Oropharynx, benign. NECK:  Supple.  No JVD or thyroid abnormalities. LUNGS:  Clear to P and A. HEART:  Regular rhythm. ABDOMEN:  No palpable organs or masses. EXTREMITIES:  The patient does have some swelling and tenderness of the right knee and chronic wound on the medial side of the left foot at the toe.  ASSESSMENT:  Diabetes mellitus with hyperglycemia improved with Glucommander protocol regimen.  The patient has had sepsis with gram- positive cocci in blood and does have MRSA which she has been cultured from urine.  PLAN:  To continue IV vancomycin, Ancef and rifampin.  She does have chronic systolic heart failure and renal failure, stage II.  She has been started back on low dose of furosemide.  We will continue the current regimen.  The patient will be seen again by  endocrinologist as well as Infectious Disease MD at [facility].     [name] G. Everette Rank, MD     AGM/MEDQ  D:  11/07/2013  T:  11/08/2013  Job:  [id number]

## 2013-11-08 NOTE — Consult Note (Signed)
ELECTROPHYSIOLOGY CONSULT NOTE    Patient ID: JAEDEN HOWSER MRN: [id number], DOB/AGE: 67/08/1946 67 y.o.  Admit date: 11/04/2013 Date of Consult: 11-08-2013  Primary Physician: [name], MD Primary Cardiologist: [name], MD Electrophysiologist: [name], MD  Reason for Consultation:  bacteremia  HPI:  Mr. Waldrum is a 68 year old female with a past medical history significant for diabetes, hypertension, non ischemic cardiomyopathy, and congestive heart failure status post CRTD implant in 09/2010. She has done well since her device implant and was last seen in follow up by Dr [name] in 09/2013.  On 1/31 she was admitted with hyperglycemia, fever and failure to thrive to [facility].  Blood cultures grew out MRSA bacteremia and she was admitted to [facility] for further evaluation.  EP has been asked to evaluate for device management.  Her device was implanted for primary prevention.  By history, she has been a CRT responder. Device interrogation today demonstrates normal device function, underlying rhythm sinus brady 50's, no history of device therapy.  Echo 11-07-13 demonstrates EF 45%, mild global hypokinesis, mild AS, shadowing at level of tricuspid valve (vegetation cannot be completely excluded).   She complains of shortness of breath and fatigue.  She denies chest pain, palpitations, dizziness, or syncope.   ROS is negative except as outlined above.   Past Medical History  Diagnosis Date  . Diabetes mellitus, type 2   . Essential hypertension, benign   . Chronic systolic heart failure   . Coronary atherosclerosis of native coronary artery     Nonobstructive  . Nonischemic cardiomyopathy     LVEF 20%  . PAD (peripheral artery disease)     Left foot gangrene  . Anxiety   . Depression   . ICD (implantable cardiac defibrillator) in place      Surgical History:  Past Surgical History  Procedure Laterality Date  . Abdominal hysterectomy    . Left shoulder  surgery      Rotator Cuff  . Breast lumpectomy      Bil  . Cardiac defibrillator placement      Medtronic [id number]   . Exploratory laparotomy with lysis of adhesions    . Bladder surgery  2011 BENIGN MASS REMOVED FROM BLADDER  . Eye surgery  BILATERAL CATARACTS REMOVAL  10 YEARS AGO PER PATIENT  . Pr vein bypass graft,aorto-fem-pop  05-2011    Left popliteal- PTA graft   . Viterectomy  11/10/2012    OD   Dr Zigmund Daniel  . 25 gauge pars plana vitrectomy with 20 gauge mvr port for macular hole Right 11/10/2012    Procedure: 68 GAUGE PARS PLANA VITRECTOMY WITH 20 GAUGE MVR PORT FOR MACULAR HOLE;  Surgeon: Hayden Pedro, MD;  Location: [place];  Service: Ophthalmology;  Laterality: Right;  . Gas insertion Right 11/10/2012    Procedure: INSERTION OF GAS;  Surgeon: Hayden Pedro, MD;  Location: [place];  Service: Ophthalmology;  Laterality: Right;  C3F8  . Serum patch Right 11/10/2012    Procedure: SERUM PATCH;  Surgeon: Hayden Pedro, MD;  Location: [place];  Service: Ophthalmology;  Laterality: Right;  . 25 gauge pars plana vitrectomy with 20 gauge mvr port for macular hole Right 05/09/2013    Procedure: 32 GAUGE PARS PLANA VITRECTOMY WITH 20 GAUGE MVR PORT FOR MACULAR HOLE;  Surgeon: Hayden Pedro, MD;  Location: [place];  Service: Ophthalmology;  Laterality: Right;  . Gas/fluid exchange Right 05/09/2013    Procedure: GAS/FLUID EXCHANGE;  Surgeon:  Hayden Pedro, MD;  Location: [place];  Service: Ophthalmology;  Laterality: Right;  C3F8   . Serum patch Right 05/09/2013    Procedure: SERUM PATCH;  Surgeon: Hayden Pedro, MD;  Location: [place];  Service: Ophthalmology;  Laterality: Right;  . Laser photo ablation Right 05/09/2013    Procedure: LASER PHOTO ABLATION;  Surgeon: Hayden Pedro, MD;  Location: [place];  Service: Ophthalmology;  Laterality: Right;  Endolaser   . Membrane peel Right 05/09/2013    Procedure: MEMBRANE PEEL;  Surgeon: Hayden Pedro, MD;  Location: [place];  Service: Ophthalmology;   Laterality: Right;     Prescriptions prior to admission  Medication Sig Dispense Refill  . amLODipine (NORVASC) 10 MG tablet Take 1 tablet (10 mg total) by mouth 2 (two) times daily.  60 tablet  6  . aspirin 81 MG tablet Take 81 mg by mouth daily.       . carvedilol (COREG) 25 MG tablet Take 1 tablet (25 mg total) by mouth 2 (two) times daily with a meal.  60 tablet  6  . digoxin (LANOXIN) 0.125 MG tablet Take 1 tablet (0.125 mg total) by mouth daily.  90 tablet  1  . docusate sodium (COLACE) 100 MG capsule Take 100 mg by mouth daily as needed. For constipation      . furosemide (LASIX) 40 MG tablet Take 40 mg by mouth daily.      . hydrALAZINE (APRESOLINE) 100 MG tablet Take 1 tablet (100 mg total) by mouth 3 (three) times daily.  90 tablet  5  . insulin glargine (LANTUS) 100 UNIT/ML injection Inject 45 Units into the skin daily.       . insulin lispro (HUMALOG) 100 UNIT/ML injection Inject 0-8 Units into the skin every evening. Sliding scale: 200-249=2 units, 250-299=4 units, 300-349=6 units, >350=8 units      . losartan (COZAAR) 50 MG tablet Take 1 tablet (50 mg total) by mouth 2 (two) times daily.  90 tablet  1  . nitroGLYCERIN (NITROSTAT) 0.4 MG SL tablet Place 0.4 mg under the tongue every 5 (five) minutes as needed. chest pain        Inpatient Medications:  . carvedilol  25 mg Oral BID WC  . Chlorhexidine Gluconate Cloth  6 each Topical Q0600  . digoxin  0.125 mg Oral Daily  . feeding supplement (RESOURCE BREEZE)  1 Container Oral TID BM  . furosemide  20 mg Oral Daily  . insulin regular  0-10 Units Intravenous TID WC  . mupirocin ointment  1 application Nasal BID  . rifampin  300 mg Oral Q12H  . sodium chloride  10-40 mL Intracatheter Q12H  . vancomycin  1,000 mg Intravenous Q48H    Allergies:  Allergies  Allergen Reactions  . Hydrocodone-Acetaminophen Other (See Comments)    hallucinations    History   Social History  . Marital Status: Divorced    Spouse Name: N/A      Number of Children: 2  . Years of Education: N/A   Occupational History  . DISABLED   . [employer] [facility] [employer]-full time     (out of work)   Social History Main Topics  . Smoking status: Never Smoker   . Smokeless tobacco: Never Used     Comment: tobacco use-no  . Alcohol Use: No  . Drug Use: No  . Sexual Activity: Not on file   Other Topics Concern  . Not on file   Social History Narrative  .  No narrative on file     Family History  Problem Relation Age of Onset  . Cirrhosis Father   . Cancer Mother     Ovarian  . Ovarian cancer Mother     Physical Exam BP 105/85  Pulse 50  Temp(Src) 98.4 F (36.9 C) (Oral)  Resp 20  Ht 5\' 1"  (1.549 m)  Wt 170 lb 10.2 oz (77.4 kg)  BMI 32.26 kg/m2  SpO2 99% Alert and oriented in no acute distress HENT- normal Eyes- EOMI, without scleral icterus Skin- warm and dry; without rashes LN-neg Device pocket well healed; without hematoma or erythema.  There is no tethering  Neck- supple without thyromegaly, JVP-flat, carotids brisk and full without bruits Back-without CVAT or kyphosis Lungs-clear to auscultation CV-Regular rate and rhythm, nl S1 and S2, no murmurs gallops or rubs, S4-absent Abd-soft with active bowel sounds; no midline pulsation or hepatomegaly Pulses-intact femoral and distal MKS-without gross deformity Neuro- Ax O, CN3-12 intact, grossly normal motor and sensory function Affect engaging   Labs:   Lab Results  Component Value Date   WBC 26.7* 11/08/2013   HGB 7.7* 11/08/2013   HCT 21.5* 11/08/2013   MCV 83.2 11/08/2013   PLT 88* 11/08/2013    Recent Labs Lab 11/07/13 0430 11/08/13 0433  NA 127* 125*  K 3.1* 3.4*  CL 93* 95*  CO2 17* 15*  BUN 68* 76*  CREATININE 3.33* 3.84*  CALCIUM 7.7* 7.6*  PROT 6.0  --   BILITOT 1.0  --   ALKPHOS 107  --   ALT 10  --   AST 23  --   GLUCOSE 122* 135*     Radiology/Studies: Dg Chest 2 View 11/04/2013   CLINICAL DATA:  Weakness  EXAM: CHEST - 2 VIEW  COMPARISON:   05/09/2013  FINDINGS: Stable cardiomegaly. Lungs clear. Stable left subclavian AICD. Orthopedic anchor in the right humeral head. . No effusion.  IMPRESSION: Stable cardiomegaly and postop change.  No acute disease.   Electronically Signed   By: Arne Cleveland M.D.   On: 11/04/2013 09:43   [id number] rhythm with P synchronous pacing, rate 73, QRS 140  TELEMETRY: sinus rhythm with P synchronous pacing  DEVICE HISTORY: Medtronic CRTD implanted 09/2010 with 5076 RA lead, 6935 RV lead, and 4196 LV lead.   MRSA Bactermia-persistent CRT-D implantation NICM--with post implant LV function improvement Diabetic ulcer on Foout A/C renal failure Relative hypotension  The patient is very ill with his staph aureus bacteremia manifesting by hypotension-relative renal insufficiency persistent bacteremia and leukocytosis.  TEE is indicated to assess for complications of bacteremia including perivalvular abscess, endocarditis, or lead involvement.  Data from [facility] suggests that explantation will be required of mortality is reduced and complications significantly diminished by early proactive explantation  We will increase her pacing rate from 50--70 to improve hemodynamics. I will discontinue her digoxin secondary to worsening renal insufficiency and discontinuing her Coreg because of modest relative hypotension. Renal involvement may be necessary.

## 2013-11-08 NOTE — Consult Note (Addendum)
[place] for Infectious Disease    Date of Admission:  11/04/2013   Total days of antibiotics 4               Reason for Consult: MRSA bacteremia    Referring Physician: Automatic consultation protocol  Principal Problem:   MRSA bacteremia Active Problems:   Biventricular implantable cardioverter-defibrillator in situ   HYPERLIPIDEMIA   CORONARY ATHEROSCLEROSIS NATIVE CORONARY ARTERY   Nonischemic cardiomyopathy   Chronic systolic heart failure   PAD (peripheral artery disease)   Atherosclerosis of native arteries of the extremities with intermittent claudication   Hyperglycemia   Type 2 diabetes mellitus   Normocytic anemia   DJD (degenerative joint disease)   . carvedilol  25 mg Oral BID WC  . Chlorhexidine Gluconate Cloth  6 each Topical Q0600  . digoxin  0.125 mg Oral Daily  . feeding supplement (RESOURCE BREEZE)  1 Container Oral TID BM  . furosemide  20 mg Oral Daily  . insulin regular  0-10 Units Intravenous TID WC  . mupirocin ointment  1 application Nasal BID  . rifampin  300 mg Oral Q12H  . sodium chloride  10-40 mL Intracatheter Q12H  . vancomycin  1,000 mg Intravenous Q48H    Addended recommendations: 1. Continue vancomycin and rifampin 2. Repeat blood cultures now 3. TEE  4. Left foot x-ray 5. Daily wet to dry dressing change for left foot ulcer  Assessment: [name] seems to be improving slowly on therapy for MRSA bacteremia. A PICC was placed on February 2 before negative blood cultures were confirmed but repeat blood cultures done that same day are negative at 48 hours so I would leave PICC in. No definite vegetation was seen on TTE so TEE is pending. Her chronic left foot ulcer could be the source for her bacteremia. I will obtain plain films look for evidence of osteomyelitis.   HPI: [name] is a 68 y.o. female who was [facility] to [facility] and admitted on January 31 with fever and hyperglycemia. Admission  blood and urine cultures have grown MRSA. No definite vegetation was noted on transthoracic echocardiogram. A PICC line was placed on February 2. Blood cultures obtained that same day are negative so far. She is now afebrile but continues to have significant electrolyte abnormalities and leukocytosis. Said she is feeling little bit better.  Addendum: The blood cultures obtained on February 2 for now growing gram-positive cocci in clusters in one of 2 sets. This could be persistent MRSA bacteremia versus an insignificant contaminant. I will repeat the blood cultures now. Referral is isolated again on the PICC line will need to be changed when blood cultures are confirmed as negative. I discussed the potential utility of a TEE with Dr. Jolyn Nap. My belief is that the primary benefit would be knowing if there is a large vegetation on the ICD leads that could lead to clinically significant septic pulmonary emboli if the leads removed percutaneously.  Review of Systems: Review of systems not obtained due to patient factors.  Past Medical History  Diagnosis Date  . Diabetes mellitus, type 2   . Essential hypertension, benign   . Chronic systolic heart failure   . Coronary atherosclerosis of native coronary artery     Nonobstructive  . Nonischemic cardiomyopathy     LVEF 20%  . PAD (peripheral artery disease)     Left foot gangrene  . Anxiety   .  Depression   . ICD (implantable cardiac defibrillator) in place     History  Substance Use Topics  . Smoking status: Never Smoker   . Smokeless tobacco: Never Used     Comment: tobacco use-no  . Alcohol Use: No    Family History  Problem Relation Age of Onset  . Cirrhosis Father   . Cancer Mother     Ovarian  . Ovarian cancer Mother    Allergies  Allergen Reactions  . Hydrocodone-Acetaminophen Other (See Comments)    hallucinations    OBJECTIVE: Blood pressure 129/33, pulse 51, temperature 98.3 F (36.8 C), temperature source Oral,  resp. rate 22, height 5\' 1"  (1.549 m), weight 77.4 kg (170 lb 10.2 oz), SpO2 100.00%. General: Weak but alert and in no distress Skin: No rash, splinter or conjunctival hemorrhages Chest: No abnormalities noted over left anterior chest ICD Lungs: Clear Cor: Regular S1 and S2 with no murmurs Abdomen: Soft and nontender Joints and extremities: Chronic ulcer or over medial aspect of left first MTP joint with green-brown drainage on gauze dressing. There is no surrounding erythema or fluctuance. Right knee: She has some tenderness medially but no redness, swelling or warmth  Lab Results Lab Results  Component Value Date   WBC 26.7* 11/08/2013   HGB 7.7* 11/08/2013   HCT 21.5* 11/08/2013   MCV 83.2 11/08/2013   PLT 88* 11/08/2013    Lab Results  Component Value Date   CREATININE 3.84* 11/08/2013   BUN 76* 11/08/2013   NA 125* 11/08/2013   K 3.4* 11/08/2013   CL 95* 11/08/2013   CO2 15* 11/08/2013    Lab Results  Component Value Date   ALT 10 11/07/2013   AST 23 11/07/2013   ALKPHOS 107 11/07/2013   BILITOT 1.0 11/07/2013     Microbiology: Recent Results (from the past 240 hour(s))  URINE CULTURE     Status: None   Collection Time    11/04/13 11:50 AM      Result Value Range Status   Specimen Description URINE, CLEAN CATCH   Final   Special Requests NONE   Final   Culture  Setup Time     Final   Value: 11/04/2013 21:17     Performed at [facility] Count     Final   Value: 65,000 COLONIES/ML     Performed at [facility]   Culture     Final   Value: METHICILLIN RESISTANT STAPHYLOCOCCUS AUREUS     Note: RIFAMPIN AND GENTAMICIN SHOULD NOT BE USED AS SINGLE DRUGS FOR TREATMENT OF STAPH INFECTIONS. CRITICAL RESULT CALLED TO, READ BACK BY AND VERIFIED WITH: JESSICA C@3 :05PM ON 11/06/13 BY DANTS     Performed at [facility]   Report Status 11/06/2013 FINAL   Final   Organism ID, Bacteria METHICILLIN RESISTANT STAPHYLOCOCCUS AUREUS   Final  STOOL CULTURE     Status: None     Collection Time    11/04/13  3:55 PM      Result Value Range Status   Specimen Description STOOL   Final   Special Requests NONE   Final   Culture     Final   Value: NO SALMONELLA, SHIGELLA, CAMPYLOBACTER, YERSINIA, OR E.COLI 0157:H7 ISOLATED     Performed at [facility]   Report Status 11/08/2013 FINAL   Final  CLOSTRIDIUM DIFFICILE BY PCR     Status: None   Collection Time    11/04/13  3:55 PM  Result Value Range Status   C difficile by pcr NEGATIVE  NEGATIVE Final  CULTURE, BLOOD (ROUTINE X 2)     Status: None   Collection Time    11/04/13  5:13 PM      Result Value Range Status   Specimen Description BLOOD LEFT HAND   Final   Special Requests BOTTLES DRAWN AEROBIC AND ANAEROBIC 10CC EACH   Final   Culture  Setup Time     Final   Value: 11/05/2013 20:50     Performed at [facility]   Culture     Final   Value: METHICILLIN RESISTANT STAPHYLOCOCCUS AUREUS     Note: RIFAMPIN AND GENTAMICIN SHOULD NOT BE USED AS SINGLE DRUGS FOR TREATMENT OF STAPH INFECTIONS. CRITICAL RESULT CALLED TO, READ BACK BY AND VERIFIED WITH: JESSICA CHILDRESS@0913  ON TO:[id number] BY [facility]     Note: Gram Stain Report Called to,Read Back By and Verified With: HAWKINS M AT 0729A ON [id number] BY THOMPSON S Performed at [facility]     Performed at [facility]   Report Status 11/07/2013 FINAL   Final   Organism ID, Bacteria METHICILLIN RESISTANT STAPHYLOCOCCUS AUREUS   Final  CULTURE, BLOOD (ROUTINE X 2)     Status: None   Collection Time    11/04/13  5:19 PM      Result Value Range Status   Specimen Description BLOOD RIGHT ARM   Final   Special Requests BOTTLES DRAWN AEROBIC AND ANAEROBIC 10CC EACH   Final   Culture  Setup Time     Final   Value: 11/05/2013 20:50     Performed at [facility]   Culture     Final   Value: STAPHYLOCOCCUS AUREUS     Note: SUSCEPTIBILITIES PERFORMED ON PREVIOUS CULTURE WITHIN THE LAST 5 DAYS.     Note: Gram Stain Report Called  to,Read Back By and Verified With: HAWKINS M AT 0729A ON [id number] BY THOMPSON S Performed at [facility]     Performed at [facility]   Report Status 11/07/2013 FINAL   Final  CULTURE, BLOOD (ROUTINE X 2)     Status: None   Collection Time    11/06/13 12:28 PM      Result Value Range Status   Specimen Description BLOOD RIGHT HAND   Final   Special Requests     Final   Value: BOTTLES DRAWN AEROBIC AND ANAEROBIC 8CC EACH BOTTLE   Culture NO GROWTH 2 DAYS   Final   Report Status PENDING   Incomplete  MRSA PCR SCREENING     Status: Abnormal   Collection Time    11/06/13 12:30 PM      Result Value Range Status   MRSA by PCR POSITIVE (*) NEGATIVE Final   Comment:            The GeneXpert MRSA Assay (FDA     approved for NASAL specimens     only), is one component of a     comprehensive MRSA colonization     surveillance program. It is not     intended to diagnose MRSA     infection nor to guide or     monitor treatment for     MRSA infections.     RESULT CALLED TO, READ BACK BY AND VERIFIED WITH:     CRUISE,J. AT 1458 ON 11/06/2013 BY BAUGHAM,M.  CULTURE, BLOOD (ROUTINE X 2)     Status: None  Collection Time    11/06/13  1:30 PM      Result Value Range Status   Specimen Description BLOOD RIGHT HAND   Final   Special Requests     Final   Value: BOTTLES DRAWN AEROBIC AND ANAEROBIC AEB=6CC ANA=4CC   Culture  Setup Time     Final   Value: 11/08/2013 01:26     Performed at [facility]   Culture     Final   Value: GRAM POSITIVE COCCI IN CLUSTERS     Note: Performed at [facility] Gram Stain Report Called to,Read Back By and Verified With: CHILDRESS J AT [id number] ON 11/07/13 BY Elza Rafter     Performed at [facility]   Report Status PENDING   Incomplete    Michel Bickers, [place] for Infectious [place] [phone] pager   [phone] cell 11/08/2013, 12:16 PM

## 2013-11-09 ENCOUNTER — Encounter (HOSPITAL_COMMUNITY): Payer: Medicare Other | Admitting: Anesthesiology

## 2013-11-09 ENCOUNTER — Inpatient Hospital Stay (HOSPITAL_COMMUNITY): Payer: Medicare Other | Admitting: Anesthesiology

## 2013-11-09 ENCOUNTER — Inpatient Hospital Stay (HOSPITAL_COMMUNITY): Payer: Medicare Other

## 2013-11-09 ENCOUNTER — Encounter (HOSPITAL_COMMUNITY): Payer: Self-pay | Admitting: Anesthesiology

## 2013-11-09 ENCOUNTER — Encounter (HOSPITAL_COMMUNITY)
Admission: EM | Disposition: A | Discharge status: Skilled Nursing Facility | Payer: Self-pay | Source: Home / Self Care | Attending: Internal Medicine

## 2013-11-09 DIAGNOSIS — I5022 Chronic systolic (congestive) heart failure: Secondary | ICD-10-CM

## 2013-11-09 DIAGNOSIS — J9601 Acute respiratory failure with hypoxia: Secondary | ICD-10-CM | POA: Diagnosis present

## 2013-11-09 DIAGNOSIS — T827XXA Infection and inflammatory reaction due to other cardiac and vascular devices, implants and grafts, initial encounter: Secondary | ICD-10-CM

## 2013-11-09 DIAGNOSIS — R7881 Bacteremia: Secondary | ICD-10-CM

## 2013-11-09 DIAGNOSIS — J9600 Acute respiratory failure, unspecified whether with hypoxia or hypercapnia: Secondary | ICD-10-CM

## 2013-11-09 DIAGNOSIS — A4902 Methicillin resistant Staphylococcus aureus infection, unspecified site: Secondary | ICD-10-CM

## 2013-11-09 DIAGNOSIS — A419 Sepsis, unspecified organism: Secondary | ICD-10-CM

## 2013-11-09 DIAGNOSIS — R509 Fever, unspecified: Secondary | ICD-10-CM

## 2013-11-09 DIAGNOSIS — Z95810 Presence of automatic (implantable) cardiac defibrillator: Secondary | ICD-10-CM

## 2013-11-09 DIAGNOSIS — R6521 Severe sepsis with septic shock: Secondary | ICD-10-CM

## 2013-11-09 DIAGNOSIS — R7309 Other abnormal glucose: Secondary | ICD-10-CM

## 2013-11-09 HISTORY — PX: ICD LEAD REMOVAL: SHX5855

## 2013-11-09 HISTORY — PX: CENTRAL VENOUS CATHETER INSERTION: SHX401

## 2013-11-09 LAB — CBC WITH DIFFERENTIAL/PLATELET
BASOS ABS: 0 10*3/uL (ref 0.0–0.1)
Basophils Relative: 0 % (ref 0–1)
Eosinophils Absolute: 0 10*3/uL (ref 0.0–0.7)
Eosinophils Relative: 0 % (ref 0–5)
HCT: 24 % — ABNORMAL LOW (ref 36.0–46.0)
HEMOGLOBIN: 8.8 g/dL — AB (ref 12.0–15.0)
LYMPHS PCT: 5 % — AB (ref 12–46)
Lymphs Abs: 1 10*3/uL (ref 0.7–4.0)
MCH: 29.9 pg (ref 26.0–34.0)
MCHC: 36.7 g/dL — ABNORMAL HIGH (ref 30.0–36.0)
MCV: 81.6 fL (ref 78.0–100.0)
Monocytes Absolute: 1 10*3/uL (ref 0.1–1.0)
Monocytes Relative: 5 % (ref 3–12)
Neutro Abs: 17.8 10*3/uL — ABNORMAL HIGH (ref 1.7–7.7)
Neutrophils Relative %: 90 % — ABNORMAL HIGH (ref 43–77)
Platelets: 101 10*3/uL — ABNORMAL LOW (ref 150–400)
RBC: 2.94 MIL/uL — ABNORMAL LOW (ref 3.87–5.11)
RDW: 14.4 % (ref 11.5–15.5)
WBC MORPHOLOGY: INCREASED
WBC: 19.8 10*3/uL — AB (ref 4.0–10.5)

## 2013-11-09 LAB — BASIC METABOLIC PANEL
BUN: 76 mg/dL — AB (ref 6–23)
BUN: 75 mg/dL — ABNORMAL HIGH (ref 6–23)
BUN: 75 mg/dL — ABNORMAL HIGH (ref 6–23)
CALCIUM: 7.5 mg/dL — AB (ref 8.4–10.5)
CALCIUM: 8.7 mg/dL (ref 8.4–10.5)
CO2: 10 meq/L — AB (ref 19–32)
CO2: 12 mEq/L — ABNORMAL LOW (ref 19–32)
CO2: 13 mEq/L — ABNORMAL LOW (ref 19–32)
CREATININE: 3.01 mg/dL — AB (ref 0.50–1.10)
Calcium: 7.4 mg/dL — ABNORMAL LOW (ref 8.4–10.5)
Chloride: 97 mEq/L (ref 96–112)
Chloride: 96 mEq/L (ref 96–112)
Chloride: 101 mEq/L (ref 96–112)
Creatinine, Ser: 2.98 mg/dL — ABNORMAL HIGH (ref 0.50–1.10)
Creatinine, Ser: 2.69 mg/dL — ABNORMAL HIGH (ref 0.50–1.10)
GFR calc Af Amer: 17 mL/min — ABNORMAL LOW (ref >90)
GFR calc Af Amer: 18 mL/min — ABNORMAL LOW (ref >90)
GFR calc non Af Amer: 15 mL/min — ABNORMAL LOW (ref >90)
GFR calc non Af Amer: 15 mL/min — ABNORMAL LOW (ref >90)
GFR, EST AFRICAN AMERICAN: 20 mL/min — AB (ref >90)
GFR, EST NON AFRICAN AMERICAN: 17 mL/min — AB (ref >90)
GLUCOSE: 182 mg/dL — AB (ref 70–99)
Glucose, Bld: 252 mg/dL — ABNORMAL HIGH (ref 70–99)
Glucose, Bld: 265 mg/dL — ABNORMAL HIGH (ref 70–99)
POTASSIUM: 3.8 meq/L (ref 3.7–5.3)
Potassium: 4.2 mEq/L (ref 3.7–5.3)
Potassium: 3.7 mEq/L (ref 3.7–5.3)
Sodium: 124 mEq/L — ABNORMAL LOW (ref 137–147)
Sodium: 125 mEq/L — ABNORMAL LOW (ref 137–147)
Sodium: 130 mEq/L — ABNORMAL LOW (ref 137–147)

## 2013-11-09 LAB — URINE MICROSCOPIC-ADD ON

## 2013-11-09 LAB — URINALYSIS, ROUTINE W REFLEX MICROSCOPIC
Bilirubin Urine: NEGATIVE
Glucose, UA: NEGATIVE mg/dL
KETONES UR: NEGATIVE mg/dL
NITRITE: NEGATIVE
PH: 5 (ref 5.0–8.0)
PROTEIN: 30 mg/dL — AB
Specific Gravity, Urine: 1.011 (ref 1.005–1.030)
UROBILINOGEN UA: 1 mg/dL (ref 0.0–1.0)

## 2013-11-09 LAB — PREPARE RBC (CROSSMATCH)

## 2013-11-09 LAB — GLUCOSE, CAPILLARY
GLUCOSE-CAPILLARY: 242 mg/dL — AB (ref 70–99)
GLUCOSE-CAPILLARY: 158 mg/dL — AB (ref 70–99)
GLUCOSE-CAPILLARY: 162 mg/dL — AB (ref 70–99)
GLUCOSE-CAPILLARY: 146 mg/dL — AB (ref 70–99)
Glucose-Capillary: 228 mg/dL — ABNORMAL HIGH (ref 70–99)
Glucose-Capillary: 171 mg/dL — ABNORMAL HIGH (ref 70–99)

## 2013-11-09 LAB — TSH: TSH: 0.877 u[IU]/mL (ref 0.350–4.500)

## 2013-11-09 LAB — POCT I-STAT 3, ART BLOOD GAS (G3+)
Acid-base deficit: 9 mmol/L — ABNORMAL HIGH (ref 0.0–2.0)
Bicarbonate: 14.2 mEq/L — ABNORMAL LOW (ref 20.0–24.0)
O2 Saturation: 100 %
PH ART: 7.41 (ref 7.350–7.450)
TCO2: 15 mmol/L (ref 0–100)
pCO2 arterial: 22.3 mmHg — ABNORMAL LOW (ref 35.0–45.0)
pO2, Arterial: 243 mmHg — ABNORMAL HIGH (ref 80.0–100.0)

## 2013-11-09 LAB — CULTURE, BLOOD (ROUTINE X 2)

## 2013-11-09 LAB — APTT: aPTT: 37 seconds (ref 24–37)

## 2013-11-09 LAB — LACTIC ACID, PLASMA: Lactic Acid, Venous: 1 mmol/L (ref 0.5–2.2)

## 2013-11-09 LAB — PROTIME-INR
INR: 1.51 — AB (ref 0.00–1.49)
PROTHROMBIN TIME: 17.8 s — AB (ref 11.6–15.2)

## 2013-11-09 LAB — TROPONIN I: TROPONIN I: 0.55 ng/mL — AB (ref <0.30)

## 2013-11-09 SURGERY — ECHOCARDIOGRAM, TRANSESOPHAGEAL
Anesthesia: Moderate Sedation

## 2013-11-09 SURGERY — REMOVAL, ELECTRODE LEAD, ICD
Anesthesia: General | Site: Groin | Laterality: Right

## 2013-11-09 MED ORDER — METOCLOPRAMIDE HCL 5 MG/ML IJ SOLN
10.0000 mg | Freq: Once | INTRAMUSCULAR | Status: AC | PRN
  Filled 2013-11-09: qty 2

## 2013-11-09 MED ORDER — MIDAZOLAM HCL 2 MG/2ML IJ SOLN
INTRAMUSCULAR | Status: AC
  Filled 2013-11-09: qty 2

## 2013-11-09 MED ORDER — NOREPINEPHRINE BITARTRATE 1 MG/ML IJ SOLN
2.0000 ug/min | INTRAMUSCULAR | Status: DC
  Filled 2013-11-09: qty 16

## 2013-11-09 MED ORDER — SODIUM BICARBONATE 8.4 % IV SOLN
INTRAVENOUS | Status: AC
  Administered 2013-11-09: 100 meq
  Filled 2013-11-09: qty 150

## 2013-11-09 MED ORDER — ROCURONIUM BROMIDE 100 MG/10ML IV SOLN
INTRAVENOUS | Status: DC | PRN
  Administered 2013-11-09 (×2): 50 mg via INTRAVENOUS

## 2013-11-09 MED ORDER — ROCURONIUM BROMIDE 50 MG/5ML IV SOLN
INTRAVENOUS | Status: AC
  Filled 2013-11-09: qty 2

## 2013-11-09 MED ORDER — GENTAMICIN SULFATE 40 MG/ML IJ SOLN
Freq: Once | INTRAMUSCULAR | Status: DC
  Filled 2013-11-09: qty 2

## 2013-11-09 MED ORDER — EPINEPHRINE HCL 1 MG/ML IJ SOLN: 0.5000 ug/min | INTRAVENOUS | Status: DC

## 2013-11-09 MED ORDER — POTASSIUM CHLORIDE 10 MEQ/100ML IV SOLN
10.0000 meq | INTRAVENOUS | Status: AC
  Administered 2013-11-09 (×2): 10 meq via INTRAVENOUS
  Filled 2013-11-09: qty 100

## 2013-11-09 MED ORDER — EPINEPHRINE HCL 1 MG/ML IJ SOLN
1000.0000 ug | INTRAVENOUS | Status: DC | PRN
  Administered 2013-11-09: 4 ug/min via INTRAVENOUS
  Administered 2013-11-09: 2 ug/min via INTRAVENOUS

## 2013-11-09 MED ORDER — ETOMIDATE 2 MG/ML IV SOLN
INTRAVENOUS | Status: AC
  Filled 2013-11-09: qty 10

## 2013-11-09 MED ORDER — PANTOPRAZOLE SODIUM 40 MG PO PACK
40.0000 mg | PACK | Freq: Every day | ORAL | Status: DC
  Administered 2013-11-09: 40 mg
  Filled 2013-11-09 (×2): qty 20

## 2013-11-09 MED ORDER — ETOMIDATE 2 MG/ML IV SOLN
INTRAVENOUS | Status: DC | PRN
  Administered 2013-11-09: 10 mg via INTRAVENOUS

## 2013-11-09 MED ORDER — INSULIN ASPART 100 UNIT/ML ~~LOC~~ SOLN: 0.0000 [IU] | Freq: Every day | SUBCUTANEOUS | Status: DC

## 2013-11-09 MED ORDER — EPHEDRINE SULFATE 50 MG/ML IJ SOLN
INTRAMUSCULAR | Status: AC
  Filled 2013-11-09: qty 1

## 2013-11-09 MED ORDER — INSULIN ASPART 100 UNIT/ML ~~LOC~~ SOLN
0.0000 [IU] | SUBCUTANEOUS | Status: DC
  Administered 2013-11-09: 3 [IU] via SUBCUTANEOUS
  Administered 2013-11-09: 4 [IU] via SUBCUTANEOUS
  Administered 2013-11-10: 7 [IU] via SUBCUTANEOUS
  Administered 2013-11-10: 3 [IU] via SUBCUTANEOUS
  Administered 2013-11-10: 7 [IU] via SUBCUTANEOUS
  Administered 2013-11-10: 11 [IU] via SUBCUTANEOUS
  Administered 2013-11-10: 7 [IU] via SUBCUTANEOUS
  Administered 2013-11-11: 3 [IU] via SUBCUTANEOUS
  Administered 2013-11-11: 4 [IU] via SUBCUTANEOUS
  Administered 2013-11-11 (×2): 3 [IU] via SUBCUTANEOUS
  Administered 2013-11-12: 4 [IU] via SUBCUTANEOUS
  Administered 2013-11-12: 7 [IU] via SUBCUTANEOUS
  Administered 2013-11-12 (×2): 4 [IU] via SUBCUTANEOUS
  Administered 2013-11-12: 3 [IU] via SUBCUTANEOUS
  Administered 2013-11-13: 4 [IU] via SUBCUTANEOUS

## 2013-11-09 MED ORDER — ONDANSETRON HCL 4 MG/2ML IJ SOLN: 4.0000 mg | Freq: Four times a day (QID) | INTRAMUSCULAR | Status: DC | PRN

## 2013-11-09 MED ORDER — FENTANYL CITRATE 0.05 MG/ML IJ SOLN
INTRAMUSCULAR | Status: DC | PRN
  Administered 2013-11-09: 150 ug via INTRAVENOUS
  Administered 2013-11-09: 100 ug via INTRAVENOUS

## 2013-11-09 MED ORDER — FENTANYL CITRATE 0.05 MG/ML IJ SOLN
25.0000 ug/h | INTRAMUSCULAR | Status: DC
  Administered 2013-11-09: 100 ug/h via INTRAVENOUS
  Administered 2013-11-10: 150 ug/h via INTRAVENOUS
  Filled 2013-11-09 (×2): qty 50

## 2013-11-09 MED ORDER — VITAL AF 1.2 CAL PO LIQD
1000.0000 mL | ORAL | Status: DC
  Administered 2013-11-09: 1000 mL
  Filled 2013-11-09 (×4): qty 1000

## 2013-11-09 MED ORDER — VITAL HIGH PROTEIN PO LIQD
1000.0000 mL | ORAL | Status: DC
  Filled 2013-11-09 (×2): qty 1000

## 2013-11-09 MED ORDER — LIDOCAINE HCL (PF) 1 % IJ SOLN
INTRAMUSCULAR | Status: AC
Start: 2013-11-09 — End: 2013-11-09
  Filled 2013-11-09: qty 30

## 2013-11-09 MED ORDER — PROPOFOL 10 MG/ML IV BOLUS
INTRAVENOUS | Status: AC
  Filled 2013-11-09: qty 20

## 2013-11-09 MED ORDER — STERILE WATER FOR INJECTION IV SOLN
Freq: Once | INTRAVENOUS | Status: AC
  Administered 2013-11-09: 16:00:00 via INTRAVENOUS
  Filled 2013-11-09: qty 850

## 2013-11-09 MED ORDER — INSULIN GLARGINE 100 UNIT/ML ~~LOC~~ SOLN
15.0000 [IU] | SUBCUTANEOUS | Status: DC
  Filled 2013-11-09: qty 0.15

## 2013-11-09 MED ORDER — ONDANSETRON HCL 4 MG/2ML IJ SOLN
INTRAMUSCULAR | Status: AC
  Filled 2013-11-09: qty 2

## 2013-11-09 MED ORDER — SODIUM CHLORIDE 0.9 % IR SOLN
80.0000 mg | Status: AC
  Filled 2013-11-09: qty 2

## 2013-11-09 MED ORDER — DOPAMINE-DEXTROSE 3.2-5 MG/ML-% IV SOLN
INTRAVENOUS | Status: AC
  Administered 2013-11-09: 10 mg via INTRAVENOUS
  Filled 2013-11-09: qty 250

## 2013-11-09 MED ORDER — SODIUM CHLORIDE 0.9 % IR SOLN
Status: DC | PRN
  Administered 2013-11-09: 08:00:00

## 2013-11-09 MED ORDER — INSULIN ASPART 100 UNIT/ML ~~LOC~~ SOLN: 0.0000 [IU] | Freq: Three times a day (TID) | SUBCUTANEOUS | Status: DC

## 2013-11-09 MED ORDER — EPHEDRINE SULFATE 50 MG/ML IJ SOLN
INTRAMUSCULAR | Status: DC | PRN
  Administered 2013-11-09: 15 mg via INTRAVENOUS
  Administered 2013-11-09: 10 mg via INTRAVENOUS

## 2013-11-09 MED ORDER — CHLORHEXIDINE GLUCONATE 4 % EX LIQD: 60.0000 mL | Freq: Once | CUTANEOUS | Status: DC

## 2013-11-09 MED ORDER — DOPAMINE-DEXTROSE 3.2-5 MG/ML-% IV SOLN
2.0000 ug/kg/min | INTRAVENOUS | Status: DC
  Administered 2013-11-09: 10 mg via INTRAVENOUS

## 2013-11-09 MED ORDER — FENTANYL CITRATE 0.05 MG/ML IJ SOLN: 25.0000 ug | INTRAMUSCULAR | Status: DC | PRN

## 2013-11-09 MED ORDER — MIDAZOLAM HCL 5 MG/5ML IJ SOLN
INTRAMUSCULAR | Status: DC | PRN
  Administered 2013-11-09: 2 mg via INTRAVENOUS

## 2013-11-09 MED ORDER — SODIUM CHLORIDE 0.9 % IV BOLUS (SEPSIS): 500.0000 mL | INTRAVENOUS | Status: AC

## 2013-11-09 MED ORDER — CALCIUM CHLORIDE 10 % IV SOLN
INTRAVENOUS | Status: AC
  Filled 2013-11-09: qty 10

## 2013-11-09 MED ORDER — PHENYLEPHRINE HCL 10 MG/ML IJ SOLN
10.0000 mg | INTRAVENOUS | Status: DC | PRN
  Administered 2013-11-09: 25 ug/min via INTRAVENOUS

## 2013-11-09 MED ORDER — VANCOMYCIN HCL IN DEXTROSE 1-5 GM/200ML-% IV SOLN: 1000.0000 mg | INTRAVENOUS | Status: DC

## 2013-11-09 MED ORDER — ROCURONIUM BROMIDE 50 MG/5ML IV SOLN
INTRAVENOUS | Status: AC
  Filled 2013-11-09: qty 1

## 2013-11-09 MED ORDER — DEXTROSE-NACL 5-0.45 % IV SOLN
INTRAVENOUS | Status: DC
  Administered 2013-11-09: 06:00:00 via INTRAVENOUS

## 2013-11-09 MED ORDER — SODIUM CHLORIDE 0.9 % IV SOLN
INTRAVENOUS | Status: DC
  Filled 2013-11-09: qty 1

## 2013-11-09 MED ORDER — LIDOCAINE HCL (PF) 1 % IJ SOLN
INTRAMUSCULAR | Status: DC | PRN
  Administered 2013-11-09: 30 mL

## 2013-11-09 MED ORDER — DEXMEDETOMIDINE HCL IN NACL 200 MCG/50ML IV SOLN: 0.4000 ug/kg/h | INTRAVENOUS | Status: DC

## 2013-11-09 MED ORDER — EPINEPHRINE HCL 1 MG/ML IJ SOLN
INTRAMUSCULAR | Status: DC | PRN
  Administered 2013-11-09: .2 mg via INTRAVENOUS
  Administered 2013-11-09: .1 mg via INTRAVENOUS
  Administered 2013-11-09 (×4): .2 mg via INTRAVENOUS

## 2013-11-09 MED ORDER — DEXTROSE 50 % IV SOLN: 25.0000 mL | INTRAVENOUS | Status: DC | PRN

## 2013-11-09 MED ORDER — LACTATED RINGERS IV SOLN
INTRAVENOUS | Status: DC | PRN
  Administered 2013-11-09: 09:00:00 via INTRAVENOUS

## 2013-11-09 MED ORDER — DEXTROSE 10 % IV SOLN: INTRAVENOUS | Status: DC | PRN

## 2013-11-09 MED ORDER — POTASSIUM CHLORIDE 10 MEQ/50ML IV SOLN
INTRAVENOUS | Status: AC
Start: 2013-11-09 — End: 2013-11-09
  Filled 2013-11-09: qty 50

## 2013-11-09 MED ORDER — CALCIUM CHLORIDE 10 % IV SOLN
1.0000 g | Freq: Once | INTRAVENOUS | Status: AC
  Administered 2013-11-09: 1 g via INTRAVENOUS

## 2013-11-09 MED ORDER — ALBUMIN HUMAN 5 % IV SOLN
INTRAVENOUS | Status: DC | PRN
  Administered 2013-11-09 (×2): via INTRAVENOUS

## 2013-11-09 MED ORDER — ACETAMINOPHEN 325 MG PO TABS: 325.0000 mg | ORAL_TABLET | ORAL | Status: DC | PRN

## 2013-11-09 MED ORDER — SODIUM CHLORIDE 0.9 % IJ SOLN
INTRAMUSCULAR | Status: AC
  Filled 2013-11-09: qty 10

## 2013-11-09 MED ORDER — EPINEPHRINE HCL 0.1 MG/ML IJ SOSY
PREFILLED_SYRINGE | INTRAMUSCULAR | Status: AC
Start: 2013-11-09 — End: 2013-11-09
  Filled 2013-11-09: qty 20

## 2013-11-09 MED ORDER — SODIUM CHLORIDE 0.9 % IV SOLN
INTRAVENOUS | Status: DC
Start: 2013-11-09 — End: 2013-11-09

## 2013-11-09 MED ORDER — VASOPRESSIN 20 UNIT/ML IJ SOLN
0.0300 [IU]/min | INTRAVENOUS | Status: DC
  Filled 2013-11-09: qty 2.5

## 2013-11-09 MED ORDER — EPINEPHRINE HCL 1 MG/ML IJ SOLN
0.5000 ug/min | INTRAVENOUS | Status: DC
  Filled 2013-11-09: qty 1

## 2013-11-09 MED ORDER — DEXMEDETOMIDINE HCL IN NACL 200 MCG/50ML IV SOLN
INTRAVENOUS | Status: AC
  Filled 2013-11-09: qty 50

## 2013-11-09 MED ORDER — STERILE WATER FOR INJECTION IV SOLN
INTRAVENOUS | Status: DC
  Administered 2013-11-09: 15:00:00 via INTRAVENOUS
  Filled 2013-11-09: qty 850

## 2013-11-09 MED ORDER — CALCIUM CHLORIDE 10 % IV SOLN
INTRAVENOUS | Status: DC | PRN
  Administered 2013-11-09 (×2): 500 mg via INTRAVENOUS

## 2013-11-09 MED ORDER — FENTANYL CITRATE 0.05 MG/ML IJ SOLN
INTRAMUSCULAR | Status: AC
  Filled 2013-11-09: qty 5

## 2013-11-09 SURGICAL SUPPLY — 50 items
BAG BANDED W/RUBBER/TAPE 36X54 (MISCELLANEOUS) ×1 IMPLANT
BAG DECANTER FOR FLEXI CONT (MISCELLANEOUS) ×6 IMPLANT
BAG EQP BAND 135X91 W/RBR TAPE (MISCELLANEOUS) ×2
BLADE STERNUM SYSTEM 6 (BLADE) ×1 IMPLANT
BLADE SURG ROTATE 9660 (MISCELLANEOUS) ×2 IMPLANT
BNDG ADH 5X4 AIR PERM ELC (GAUZE/BANDAGES/DRESSINGS) ×2
BNDG COHESIVE 4X5 WHT NS (GAUZE/BANDAGES/DRESSINGS) ×3 IMPLANT
CANISTER SUCTION 2500CC (MISCELLANEOUS) ×3 IMPLANT
COVER TABLE BACK 60X90 (DRAPES) ×4 IMPLANT
DRAPE C-ARM 42X72 X-RAY (DRAPES) IMPLANT
DRAPE CARDIOVASCULAR INCISE (DRAPES) ×3
DRAPE INCISE IOBAN 66X45 STRL (DRAPES) IMPLANT
DRAPE PROXIMA HALF (DRAPES) ×2 IMPLANT
DRAPE SRG 135X102X78XABS (DRAPES) ×2 IMPLANT
DRSG TEGADERM 4X4.75 (GAUZE/BANDAGES/DRESSINGS) ×3 IMPLANT
ELECT REM PT RETURN 9FT ADLT (ELECTROSURGICAL) ×6
ELECTRODE REM PT RTRN 9FT ADLT (ELECTROSURGICAL) ×4 IMPLANT
GAUZE PACKING IODOFORM 1 (PACKING) IMPLANT
GAUZE SPONGE 4X4 16PLY XRAY LF (GAUZE/BANDAGES/DRESSINGS) IMPLANT
GLOVE BIOGEL PI IND STRL 6.5 (GLOVE) IMPLANT
GLOVE BIOGEL PI IND STRL 7.5 (GLOVE) ×2 IMPLANT
GLOVE BIOGEL PI INDICATOR 6.5 (GLOVE) ×2
GLOVE BIOGEL PI INDICATOR 7.5 (GLOVE) ×1
GLOVE ECLIPSE 6.5 STRL STRAW (GLOVE) ×2 IMPLANT
GLOVE ECLIPSE 8.0 STRL XLNG CF (GLOVE) ×3 IMPLANT
GOWN PREVENTION PLUS XLARGE (GOWN DISPOSABLE) ×3 IMPLANT
GOWN STRL NON-REIN LRG LVL3 (GOWN DISPOSABLE) ×4 IMPLANT
KIT ROOM TURNOVER OR (KITS) ×3 IMPLANT
NDL PERC 18GX7CM (NEEDLE) IMPLANT
NEEDLE PERC 18GX7CM (NEEDLE) ×3 IMPLANT
NS IRRIG 1000ML POUR BTL (IV SOLUTION) IMPLANT
PACEMAKER INSERTION TRAY ×3 IMPLANT
PAD ARMBOARD 7.5X6 YLW CONV (MISCELLANEOUS) ×6 IMPLANT
PAD ELECT DEFIB RADIOL ZOLL (MISCELLANEOUS) ×3 IMPLANT
SHEATH BRITE TIP 7FRX11 (SHEATH) ×2 IMPLANT
SPONGE GAUZE 4X4 12PLY (GAUZE/BANDAGES/DRESSINGS) ×3 IMPLANT
SUT PROLENE 2 0 CT2 30 (SUTURE) IMPLANT
SUT PROLENE 2 0 SH DA (SUTURE) IMPLANT
SUT VIC AB 2-0 CT2 18 VCP726D (SUTURE) IMPLANT
SUT VIC AB 3-0 X1 27 (SUTURE) IMPLANT
TAPE CLOTH SURG 4X10 WHT LF (GAUZE/BANDAGES/DRESSINGS) ×1 IMPLANT
TOWEL OR 17X24 6PK STRL BLUE (TOWEL DISPOSABLE) ×6 IMPLANT
TOWEL OR 17X26 10 PK STRL BLUE (TOWEL DISPOSABLE) ×6 IMPLANT
TRAY FOLEY IC TEMP SENS 14FR (CATHETERS) ×1 IMPLANT
TRAY FOLEY IC TEMP SENS 16FR (CATHETERS) IMPLANT
TUBE CONNECTING 12X1/4 (SUCTIONS) ×3 IMPLANT
TUBING ART PRESS 72  MALE/FEM (TUBING) ×1
TUBING ART PRESS 72 MALE/FEM (TUBING) ×1 IMPLANT
WIRE MAILMAN 182CM (WIRE) ×2 IMPLANT
YANKAUER SUCT BULB TIP NO VENT (SUCTIONS) ×3 IMPLANT

## 2013-11-09 NOTE — Progress Notes (Signed)
CRITICAL VALUE ALERT  Critical value received:  Troponin 0.55  Date of notification:  11/09/2013  Time of notification:  1320  Critical value read back:yes  Nurse who received alert:  Argie Ramming  MD notified (1st page):  Dr Titus Mould  Time of first page:  11/09/2013  MD notified (2nd page):  Time of second page:  Responding MD:  Dr Titus Mould  Time MD responded:  1320

## 2013-11-09 NOTE — Anesthesia Postprocedure Evaluation (Signed)
  Anesthesia Post-op Note  Patient: [name]  Procedure(s) Performed: Procedure(s): ICD LEAD REMOVAL (N/A) INSERTION CENTRAL LINE ADULT (Right)  Patient Location: ICU  Anesthesia Type:General  Level of Consciousness: sedated  Airway and Oxygen Therapy: Patient remains intubated per anesthesia plan and Patient placed on Ventilator (see vital sign flow sheet for setting)  Post-op Pain: none  Post-op Assessment: Post-op Vital signs reviewed, PATIENT'S CARDIOVASCULAR STATUS UNSTABLE, Respiratory Function Stable, Patent Airway, No signs of Nausea or vomiting and Pain level controlled  Post-op Vital Signs: Reviewed and stable  Complications: No apparent anesthesia complications

## 2013-11-09 NOTE — Consult Note (Signed)
Name: [name] MRN: [id number] DOB: Oct 07, 1945    ADMISSION DATE:  11/04/2013 CONSULTATION DATE:  11/09/13  REFERRING MD :  Wendee Beavers PRIMARY SERVICE: PCCM  CHIEF COMPLAINT:  shock  BRIEF PATIENT DESCRIPTION: Patient is a 68 yo female with history of DM 2, HTN, CHF (EF 20%), CAD, PAD, ICD, Depression who presented to the hospital for hyperglycemia and failure to thrive, also with fever. During initial hospitalization she had blood cultures drawn that revealed MRSA. She has been treated with cefazolin and rifampin. She had her ICD removed today for concern for septic source and developed hypotension following the procedure.  SIGNIFICANT EVENTS / STUDIES:  2/1 admission to [facility] for hyperglycemia, on insulin drip 2/1 BCx with MRSA, started on vanc 2/2 ID c/s and rifampin and cefazolin added 2/3 TTE EF 45% global hypokinesis, moderate LVH, grade 1 diastolic dysfunction, shadowing at level of tricuspid valve (vegetation cannot be completely excluded).  2/4 transferred to [facility] from [facility] 2/5- removal wires, shock, ETT  LINES / TUBES: 2/2 PICC >>> 2/5 ETT >>> 2/5 rt cordis>>>  CULTURES: 1/31 Stool Cx >>> no growth 1/31 BCx x2 >>> MRSA 1/31 UCx >>> MRSA 1/31 C diff >>> neg 2/2 Nasal PCR >>> MRSA 2/2 BCx >>> staph aureus, awaiting susceptibilities 2/2 BCx >>> NGTD 2/4 BCx x2 >>>  2/5 UCx >>>  ANTIBIOTICS: 2/2 Vanc >>> 2/2 Rifampin >>> 2/2 cefazolin >>> 2/3  HISTORY OF PRESENT ILLNESS:  Patient is a 68 yo female with history of DM 2, HTN, CHF (EF 20%), CAD, PAD, ICD, Depression who presented to the hospital for hyperglycemia and failure to thrive, also with fever. She was started on insulin drip for treatment of hyperglycemia. Admission blood cultures noted to be positive for MRSA and the patient was started on vanc, rifampin, and cefazolin. Patient was transferred to [facility] from [place] for further management. With her history of ICD in place cardiology was consulted for evaluation  and proceeded to remove the ICD this morning. Following the removal of the ICD the patient became hypotensive believed to be due to septic emboli. PCCM was asked to assume care.  PAST MEDICAL HISTORY :  Past Medical History  Diagnosis Date  . Diabetes mellitus, type 2   . Essential hypertension, benign   . Chronic systolic heart failure   . Coronary atherosclerosis of native coronary artery     Nonobstructive  . Nonischemic cardiomyopathy     LVEF 20%  . PAD (peripheral artery disease)     Left foot gangrene  . Anxiety   . Depression   . ICD (implantable cardiac defibrillator) in place    Past Surgical History  Procedure Laterality Date  . Abdominal hysterectomy    . Left shoulder surgery      Rotator Cuff  . Breast lumpectomy      Bil  . Cardiac defibrillator placement      Medtronic [id number]   . Exploratory laparotomy with lysis of adhesions    . Bladder surgery  2011 BENIGN MASS REMOVED FROM BLADDER  . Eye surgery  BILATERAL CATARACTS REMOVAL  10 YEARS AGO PER PATIENT  . Pr vein bypass graft,aorto-fem-pop  05-2011    Left popliteal- PTA graft   . Viterectomy  11/10/2012    OD   Dr Zigmund Jonelle Bann  . 25 gauge pars plana vitrectomy with 20 gauge mvr port for macular hole Right 11/10/2012    Procedure: 20 GAUGE PARS PLANA VITRECTOMY WITH 20 GAUGE MVR PORT FOR MACULAR HOLE;  Surgeon: Hayden Pedro, MD;  Location: [place];  Service: Ophthalmology;  Laterality: Right;  . Gas insertion Right 11/10/2012    Procedure: INSERTION OF GAS;  Surgeon: Hayden Pedro, MD;  Location: [place];  Service: Ophthalmology;  Laterality: Right;  C3F8  . Serum patch Right 11/10/2012    Procedure: SERUM PATCH;  Surgeon: Hayden Pedro, MD;  Location: [place];  Service: Ophthalmology;  Laterality: Right;  . 25 gauge pars plana vitrectomy with 20 gauge mvr port for macular hole Right 05/09/2013    Procedure: 70 GAUGE PARS PLANA VITRECTOMY WITH 20 GAUGE MVR PORT FOR MACULAR HOLE;  Surgeon: Hayden Pedro, MD;  Location:  [place];  Service: Ophthalmology;  Laterality: Right;  . Gas/fluid exchange Right 05/09/2013    Procedure: GAS/FLUID EXCHANGE;  Surgeon: Hayden Pedro, MD;  Location: [place];  Service: Ophthalmology;  Laterality: Right;  C3F8   . Serum patch Right 05/09/2013    Procedure: SERUM PATCH;  Surgeon: Hayden Pedro, MD;  Location: [place];  Service: Ophthalmology;  Laterality: Right;  . Laser photo ablation Right 05/09/2013    Procedure: LASER PHOTO ABLATION;  Surgeon: Hayden Pedro, MD;  Location: [place];  Service: Ophthalmology;  Laterality: Right;  Endolaser   . Membrane peel Right 05/09/2013    Procedure: MEMBRANE PEEL;  Surgeon: Hayden Pedro, MD;  Location: [place];  Service: Ophthalmology;  Laterality: Right;   Prior to Admission medications   Medication Sig Start Date End Date Taking? Authorizing Provider  amLODipine (NORVASC) 10 MG tablet Take 1 tablet (10 mg total) by mouth 2 (two) times daily. 06/12/13  Yes Satira Sark, MD  aspirin 81 MG tablet Take 81 mg by mouth daily.    Yes Historical Provider, MD  carvedilol (COREG) 25 MG tablet Take 1 tablet (25 mg total) by mouth 2 (two) times daily with a meal. 07/05/13  Yes Satira Sark, MD  digoxin (LANOXIN) 0.125 MG tablet Take 1 tablet (0.125 mg total) by mouth daily. 05/02/13  Yes Lendon Colonel, NP  docusate sodium (COLACE) 100 MG capsule Take 100 mg by mouth daily as needed. For constipation   Yes Historical Provider, MD  furosemide (LASIX) 40 MG tablet Take 40 mg by mouth daily.   Yes Historical Provider, MD  hydrALAZINE (APRESOLINE) 100 MG tablet Take 1 tablet (100 mg total) by mouth 3 (three) times daily. 07/05/13  Yes Satira Sark, MD  insulin glargine (LANTUS) 100 UNIT/ML injection Inject 45 Units into the skin daily.    Yes Historical Provider, MD  insulin lispro (HUMALOG) 100 UNIT/ML injection Inject 0-8 Units into the skin every evening. Sliding scale: 200-249=2 units, 250-299=4 units, 300-349=6 units, >350=8 units   Yes  Historical Provider, MD  losartan (COZAAR) 50 MG tablet Take 1 tablet (50 mg total) by mouth 2 (two) times daily. 07/28/13  Yes Satira Sark, MD  nitroGLYCERIN (NITROSTAT) 0.4 MG SL tablet Place 0.4 mg under the tongue every 5 (five) minutes as needed. chest pain   Yes Historical Provider, MD   Allergies  Allergen Reactions  . Hydrocodone-Acetaminophen Other (See Comments)    hallucinations    FAMILY HISTORY:  Family History  Problem Relation Age of Onset  . Cirrhosis Father   . Cancer Mother     Ovarian  . Ovarian cancer Mother    SOCIAL HISTORY:  reports that she has never smoked. She has never used smokeless tobacco. She reports that she does not drink  alcohol or use illicit drugs.  REVIEW OF SYSTEMS:  Patient unable to give ROS  SUBJECTIVE: arrived post op shock  VITAL SIGNS: Temp:  [97.5 F (36.4 C)-99.1 F (37.3 C)] 97.6 F (36.4 C) (02/05 1139) Pulse Rate:  [60-74] 60 (02/05 1148) Resp:  [15-29] 24 (02/05 1148) BP: (127-152)/(43-64) 152/53 mmHg (02/05 1148) SpO2:  [98 %-100 %] 100 % (02/05 1148) FiO2 (%):  [60 %-100 %] 60 % (02/05 1148) Weight:  [171 lb 8.3 oz (77.8 kg)-171 lb 14.4 oz (77.973 kg)] 171 lb 8.3 oz (77.8 kg) (02/05 1045) HEMODYNAMICS:   VENTILATOR SETTINGS: Vent Mode:  [-] PRVC FiO2 (%):  [60 %-100 %] 60 % Set Rate:  [18 bmp-30 bmp] 30 bmp Vt Set:  [500 mL] 500 mL PEEP:  [5 cmH20] 5 cmH20 Plateau Pressure:  [20 cmH20] 20 cmH20 INTAKE / OUTPUT: Intake/Output     02/04 0701 - 02/05 0700 02/05 0701 - 02/06 0700   I.V. (mL/kg) 50 (0.6) 576.1 (7.4)   Blood 325    Other 125    IV Piggyback  500   Total Intake(mL/kg) 500 (6.4) 1076.1 (13.8)   Urine (mL/kg/hr)  1125 (2.2)   Blood  50 (0.1)   Total Output   1175   Net +500 -98.9          PHYSICAL EXAMINATION: General:  ETT in place, comfortable appearing Neuro:  wakes up to painful stimuli, blinks to threat, will grip hand on command, rass score -3 HEENT:  ETT in place,  [place] Cardiovascular: rrr, no murmur appreciated Lungs: clear breath sounds, no wheezing Abdomen:  Soft, ND, hypoactive BS Musculoskeletal:  No gross deformities Skin:  Left 1st MTP with ulceration ~1 cm diameter, rt fem wnl  LABS:  CBC  Recent Labs Lab 11/07/13 1420 11/08/13 0433 11/08/13 0719 11/09/13 1230  WBC 24.4* 26.7*  --  19.8*  HGB 7.9* 8.0* 7.7* 8.8*  HCT 22.7* 22.7* 21.5* 24.0*  PLT 86* 88*  --  PENDING   Coag's  Recent Labs Lab 11/04/13 0832 11/07/13 1853 11/09/13 1230  APTT  --   --  37  INR 1.26 1.27 1.51*   BMET  Recent Labs Lab 11/09/13 0315 11/09/13 0620 11/09/13 1230  NA 124* 125* 130*  K 4.2 3.7 3.8  CL 97 96 101  CO2 10* 12* 13*  BUN 76* 75* 75*  CREATININE 3.01* 2.98* 2.69*  GLUCOSE 252* 265* 182*   Electrolytes  Recent Labs Lab 11/09/13 0315 11/09/13 0620 11/09/13 1230  CALCIUM 7.5* 7.4* 8.7   Sepsis Markers  Recent Labs Lab 11/09/13 1230  LATICACIDVEN 1.0   ABG  Recent Labs Lab 11/04/13 0909  PHART 7.430  PCO2ART 28.6*  PO2ART 109.0*   Liver Enzymes  Recent Labs Lab 11/04/13 0832 11/07/13 0430  AST 27 23  ALT 12 10  ALKPHOS 108 107  BILITOT 0.4 1.0  ALBUMIN 2.7* 1.9*   Cardiac Enzymes  Recent Labs Lab 11/09/13 1230  TROPONINI 0.55*   Glucose  Recent Labs Lab 11/08/13 1816 11/08/13 2251 11/09/13 0527 11/09/13 0657 11/09/13 0938 11/09/13 1016  GLUCAP 179* 247* 242* 228* 158* 162*    Imaging Dg Chest Port 1 View  11/09/2013   CLINICAL DATA:  Hypoxia  EXAM: PORTABLE CHEST - 1 VIEW  COMPARISON:  November 06, 2013  FINDINGS: Endotracheal tube tip is 3.2 cm above the carina. Central catheter tip is in the superior vena cava near the junction with the right atrium. Nasogastric tube is coiled in the  stomach. Pacemaker no longer appreciable. No pneumothorax.  There is no edema or consolidation. Heart is moderately enlarged with normal pulmonary vascularity, stable. No adenopathy.  IMPRESSION: Tube and  catheter positions as described without pneumothorax. Stable cardiac enlargement. No edema or consolidation.   Electronically Signed   By: Lowella Grip M.D.   On: 11/09/2013 10:21   Dg Abd Portable 1v  11/09/2013   CLINICAL DATA:  Orogastric tube placement.  EXAM: PORTABLE ABDOMEN - 1 VIEW  COMPARISON:  02/20/2011  FINDINGS: Nasogastric tube curls within the stomach, well positioned.  There is a femoral a placed right iliac vein introducer sheath.  Soft tissues are otherwise unremarkable.  Normal bowel gas pattern.  IMPRESSION: Nasogastric tube is well positioned curling within the stomach.   Electronically Signed   By: Lajean Manes M.D.   On: 11/09/2013 10:27   Dg Foot 2 Views Left  11/08/2013   CLINICAL DATA:  Chronic ulcer  EXAM: LEFT FOOT - 2 VIEW  COMPARISON:  None.  FINDINGS: There is a skin defect medial to the head of the first metatarsal. There is cortical demineralization in the adjacent head of the first metatarsal. No definite involvement of the subchondral cortex or MTP joint. There is a hallux valgus deformity. Negative for fracture. Otherwise normal mineralization and alignment. Small calcaneal spur at the plantar aponeurosis.  IMPRESSION: 1. Demineralization in the lateral aspect head first metatarsal, with an overlying soft tissue defect, suggesting osteomyelitis.   Electronically Signed   By: Arne Cleveland M.D.   On: 11/08/2013 21:46    ASSESSMENT / PLAN:  PULMONARY A: acute respiratory failure likely related to septic emboli, uncompensated metabolic acidosis P:   Mechanical vantilation FiO2 60, RR 30, tidal volume 500, Peep 5 pcxr in am Increased rate, repeat abg Reduce O2  CARDIOVASCULAR A: septic Shock / SIRS post removal wires pulmonary septic emboli S/p ICD removal SVC thrombus CHF EF 45% H/o HTN Troponin 0.55, likely related to hypotensive episode / demand P:  Pressor management for MAP >60 - weaned off dopamine, vasopressin, and levophed at this time Upon  arrival had added dopamine and reduced epi to off If pressor needed would re add dop if brady related F/u cortisol - consider stress dose steroids if elevated Lactic acid 1.0 F/u results TEE Obtain EKG Hold home antihypertensives Cards following Holding off on neck lines with SVC clot Avoid epi as able with pos trop  RENAL A:  Acute on chronic kidney disease, baseline Cr 1.3 Hyponatremia improving Anion/non-anion gap metabolic acidosis - uremia contributing factor  P:   Q 12 h BMET  Start bicarb drip for metabolic acidosis x 1 liter then 1/2 NS to avoid NON AG wosening   GASTROINTESTINAL A:  No acute issues P:   PPI GI protection To start tube feeds Monitor for BMs LFt to r/o shock liver  HEMATOLOGIC A:  Anemic, below baseline, improved from previous value Leukocytosis in setting of MRSA infection P:  Monitor CBC SCDs VTE prophylaxis Follow plat count in am , may consider sub q hep then  INFECTIOUS A:  MRSA bacteremia Septic endocarditis Left first MTP ulceration foot XR with concern for osteomylitis P:   Continue vanc and rifampin ID following F/u repeat BCx Consider MRI of foot to further evaluate osteo  ENDOCRINE A:  DM2 with hyperglycemia   P:   Will d/c insulin drip Place on SSI Continue lantus 15 u daily Await cortisol level  NEUROLOGIC A:  Sedated, will wake up to  painful stimuli and follow commands P:   Fentanyl for sedation and pain control  TODAY'S SUMMARY: transferred to the ICU, continue pressors and vent support, antibiotics to continue  Tommi Rumps, MD [facility] PGY-2  I have personally obtained a history, examined the patient, evaluated laboratory and imaging results, formulated the assessment and plan and placed orders. CRITICAL CARE: The patient is critically ill with multiple organ systems failure and requires high complexity decision making for assessment and support, frequent evaluation and titration of  therapies, application of advanced monitoring technologies and extensive interpretation of multiple databases. Critical Care Time devoted to patient care services described in this note is 85 minutes.   Lavon Paganini. Titus Mould, MD, [place] Pgr: [facility]  Pulmonary and [place] Pager: [phone]  11/09/2013, 1:30 PM

## 2013-11-09 NOTE — H&P (View-Only) (Signed)
ELECTROPHYSIOLOGY CONSULT NOTE    Patient ID: [name] MRN: [id number], DOB/AGE: 11/12/1945 68 y.o.  Admit date: 11/04/2013 Date of Consult: 11-08-2013  Primary Physician: [name], MD Primary Cardiologist: [name], MD Electrophysiologist: [name], MD  Reason for Consultation:  bacteremia  HPI:  Mr. [name] is a 68 year old female with a past medical history significant for diabetes, hypertension, non ischemic cardiomyopathy, and congestive heart failure status post CRTD implant in 09/2010. She has done well since her device implant and was last seen in follow up by Dr [name] in 09/2013.  On 1/31 she was admitted with hyperglycemia, fever and failure to thrive to [facility].  Blood cultures grew out MRSA bacteremia and she was admitted to [facility] for further evaluation.  EP has been asked to evaluate for device management.  Her device was implanted for primary prevention.  By history, she has been a CRT responder. Device interrogation today demonstrates normal device function, underlying rhythm sinus brady 50's, no history of device therapy.  Echo 11-07-13 demonstrates EF 45%, mild global hypokinesis, mild AS, shadowing at level of tricuspid valve (vegetation cannot be completely excluded).   She complains of shortness of breath and fatigue.  She denies chest pain, palpitations, dizziness, or syncope.   ROS is negative except as outlined above.   Past Medical History  Diagnosis Date  . Diabetes mellitus, type 2   . Essential hypertension, benign   . Chronic systolic heart failure   . Coronary atherosclerosis of native coronary artery     Nonobstructive  . Nonischemic cardiomyopathy     LVEF 20%  . PAD (peripheral artery disease)     Left foot gangrene  . Anxiety   . Depression   . ICD (implantable cardiac defibrillator) in place      Surgical History:  Past Surgical History  Procedure Laterality Date  . Abdominal hysterectomy    . Left shoulder  surgery      Rotator Cuff  . Breast lumpectomy      Bil  . Cardiac defibrillator placement      Medtronic [id number]   . Exploratory laparotomy with lysis of adhesions    . Bladder surgery  2011 BENIGN MASS REMOVED FROM BLADDER  . Eye surgery  BILATERAL CATARACTS REMOVAL  10 YEARS AGO PER PATIENT  . Pr vein bypass graft,aorto-fem-pop  05-2011    Left popliteal- PTA graft   . Viterectomy  11/10/2012    OD   Dr Zigmund Daniel  . 25 gauge pars plana vitrectomy with 20 gauge mvr port for macular hole Right 11/10/2012    Procedure: 61 GAUGE PARS PLANA VITRECTOMY WITH 20 GAUGE MVR PORT FOR MACULAR HOLE;  Surgeon: Hayden Pedro, MD;  Location: [place];  Service: Ophthalmology;  Laterality: Right;  . Gas insertion Right 11/10/2012    Procedure: INSERTION OF GAS;  Surgeon: Hayden Pedro, MD;  Location: [place];  Service: Ophthalmology;  Laterality: Right;  C3F8  . Serum patch Right 11/10/2012    Procedure: SERUM PATCH;  Surgeon: Hayden Pedro, MD;  Location: [place];  Service: Ophthalmology;  Laterality: Right;  . 25 gauge pars plana vitrectomy with 20 gauge mvr port for macular hole Right 05/09/2013    Procedure: 39 GAUGE PARS PLANA VITRECTOMY WITH 20 GAUGE MVR PORT FOR MACULAR HOLE;  Surgeon: Hayden Pedro, MD;  Location: [place];  Service: Ophthalmology;  Laterality: Right;  . Gas/fluid exchange Right 05/09/2013    Procedure: GAS/FLUID EXCHANGE;  Surgeon:  Hayden Pedro, MD;  Location: [place];  Service: Ophthalmology;  Laterality: Right;  C3F8   . Serum patch Right 05/09/2013    Procedure: SERUM PATCH;  Surgeon: Hayden Pedro, MD;  Location: [place];  Service: Ophthalmology;  Laterality: Right;  . Laser photo ablation Right 05/09/2013    Procedure: LASER PHOTO ABLATION;  Surgeon: Hayden Pedro, MD;  Location: [place];  Service: Ophthalmology;  Laterality: Right;  Endolaser   . Membrane peel Right 05/09/2013    Procedure: MEMBRANE PEEL;  Surgeon: Hayden Pedro, MD;  Location: [place];  Service: Ophthalmology;   Laterality: Right;     Prescriptions prior to admission  Medication Sig Dispense Refill  . amLODipine (NORVASC) 10 MG tablet Take 1 tablet (10 mg total) by mouth 2 (two) times daily.  60 tablet  6  . aspirin 81 MG tablet Take 81 mg by mouth daily.       . carvedilol (COREG) 25 MG tablet Take 1 tablet (25 mg total) by mouth 2 (two) times daily with a meal.  60 tablet  6  . digoxin (LANOXIN) 0.125 MG tablet Take 1 tablet (0.125 mg total) by mouth daily.  90 tablet  1  . docusate sodium (COLACE) 100 MG capsule Take 100 mg by mouth daily as needed. For constipation      . furosemide (LASIX) 40 MG tablet Take 40 mg by mouth daily.      . hydrALAZINE (APRESOLINE) 100 MG tablet Take 1 tablet (100 mg total) by mouth 3 (three) times daily.  90 tablet  5  . insulin glargine (LANTUS) 100 UNIT/ML injection Inject 45 Units into the skin daily.       . insulin lispro (HUMALOG) 100 UNIT/ML injection Inject 0-8 Units into the skin every evening. Sliding scale: 200-249=2 units, 250-299=4 units, 300-349=6 units, >350=8 units      . losartan (COZAAR) 50 MG tablet Take 1 tablet (50 mg total) by mouth 2 (two) times daily.  90 tablet  1  . nitroGLYCERIN (NITROSTAT) 0.4 MG SL tablet Place 0.4 mg under the tongue every 5 (five) minutes as needed. chest pain        Inpatient Medications:  . carvedilol  25 mg Oral BID WC  . Chlorhexidine Gluconate Cloth  6 each Topical Q0600  . digoxin  0.125 mg Oral Daily  . feeding supplement (RESOURCE BREEZE)  1 Container Oral TID BM  . furosemide  20 mg Oral Daily  . insulin regular  0-10 Units Intravenous TID WC  . mupirocin ointment  1 application Nasal BID  . rifampin  300 mg Oral Q12H  . sodium chloride  10-40 mL Intracatheter Q12H  . vancomycin  1,000 mg Intravenous Q48H    Allergies:  Allergies  Allergen Reactions  . Hydrocodone-Acetaminophen Other (See Comments)    hallucinations    History   Social History  . Marital Status: Divorced    Spouse Name: N/A      Number of Children: 2  . Years of Education: N/A   Occupational History  . DISABLED   . [employer] [facility] [employer]-full time     (out of work)   Social History Main Topics  . Smoking status: Never Smoker   . Smokeless tobacco: Never Used     Comment: tobacco use-no  . Alcohol Use: No  . Drug Use: No  . Sexual Activity: Not on file   Other Topics Concern  . Not on file   Social History Narrative  .  No narrative on file     Family History  Problem Relation Age of Onset  . Cirrhosis Father   . Cancer Mother     Ovarian  . Ovarian cancer Mother     Physical Exam BP 105/85  Pulse 50  Temp(Src) 98.4 F (36.9 C) (Oral)  Resp 20  Ht 5\' 1"  (1.549 m)  Wt 170 lb 10.2 oz (77.4 kg)  BMI 32.26 kg/m2  SpO2 99% Alert and oriented in no acute distress HENT- normal Eyes- EOMI, without scleral icterus Skin- warm and dry; without rashes LN-neg Device pocket well healed; without hematoma or erythema.  There is no tethering  Neck- supple without thyromegaly, JVP-flat, carotids brisk and full without bruits Back-without CVAT or kyphosis Lungs-clear to auscultation CV-Regular rate and rhythm, nl S1 and S2, no murmurs gallops or rubs, S4-absent Abd-soft with active bowel sounds; no midline pulsation or hepatomegaly Pulses-intact femoral and distal MKS-without gross deformity Neuro- Ax O, CN3-12 intact, grossly normal motor and sensory function Affect engaging   Labs:   Lab Results  Component Value Date   WBC 26.7* 11/08/2013   HGB 7.7* 11/08/2013   HCT 21.5* 11/08/2013   MCV 83.2 11/08/2013   PLT 88* 11/08/2013    Recent Labs Lab 11/07/13 0430 11/08/13 0433  NA 127* 125*  K 3.1* 3.4*  CL 93* 95*  CO2 17* 15*  BUN 68* 76*  CREATININE 3.33* 3.84*  CALCIUM 7.7* 7.6*  PROT 6.0  --   BILITOT 1.0  --   ALKPHOS 107  --   ALT 10  --   AST 23  --   GLUCOSE 122* 135*     Radiology/Studies: Dg Chest 2 View 11/04/2013   CLINICAL DATA:  Weakness  EXAM: CHEST - 2 VIEW  COMPARISON:   05/09/2013  FINDINGS: Stable cardiomegaly. Lungs clear. Stable left subclavian AICD. Orthopedic anchor in the right humeral head. . No effusion.  IMPRESSION: Stable cardiomegaly and postop change.  No acute disease.   Electronically Signed   By: Arne Cleveland M.D.   On: 11/04/2013 09:43   [id number] rhythm with P synchronous pacing, rate 73, QRS 140  TELEMETRY: sinus rhythm with P synchronous pacing  DEVICE HISTORY: Medtronic CRTD implanted 09/2010 with 5076 RA lead, 6935 RV lead, and 4196 LV lead.   MRSA Bactermia-persistent CRT-D implantation NICM--with post implant LV function improvement Diabetic ulcer on Foout A/C renal failure Relative hypotension  The patient is very ill with his staph aureus bacteremia manifesting by hypotension-relative renal insufficiency persistent bacteremia and leukocytosis.  TEE is indicated to assess for complications of bacteremia including perivalvular abscess, endocarditis, or lead involvement.  Data from [facility] suggests that explantation will be required of mortality is reduced and complications significantly diminished by early proactive explantation  We will increase her pacing rate from 50--70 to improve hemodynamics. I will discontinue her digoxin secondary to worsening renal insufficiency and discontinuing her Coreg because of modest relative hypotension. Renal involvement may be necessary.

## 2013-11-09 NOTE — Transfer of Care (Signed)
Immediate Anesthesia Transfer of Care Note  Patient: [name]  Procedure(s) Performed: Procedure(s): ICD LEAD REMOVAL (N/A) INSERTION CENTRAL LINE ADULT (Right)  Patient Location: ICU  Anesthesia Type:General  Level of Consciousness: sedated and unresponsive  Airway & Oxygen Therapy: Patient remains intubated per anesthesia plan and Patient placed on Ventilator (see vital sign flow sheet for setting)  Post-op Assessment: Report given to PACU RN and Post -op Vital signs reviewed and unstable, Anesthesiologist notified  Post vital signs: Reviewed and unstable  Complications: No apparent anesthesia complications

## 2013-11-09 NOTE — Progress Notes (Signed)
Patient ID: [name], female   DOB: 1946/02/25, 68 y.o.   MRN: [id number]         [facility] for Infectious Disease    Date of Admission:  11/04/2013   Total days of antibiotics 5         Principal Problem:   MRSA bacteremia Active Problems:   Biventricular implantable cardioverter-defibrillator in situ   HYPERLIPIDEMIA   CORONARY ATHEROSCLEROSIS NATIVE CORONARY ARTERY   Nonischemic cardiomyopathy   Chronic systolic heart failure   PAD (peripheral artery disease)   Atherosclerosis of native arteries of the extremities with intermittent claudication   Hyperglycemia   Type 2 diabetes mellitus   Normocytic anemia   DJD (degenerative joint disease)   Foot ulcer, left   . Chlorhexidine Gluconate Cloth  6 each Topical Q0600  . feeding supplement (RESOURCE BREEZE)  1 Container Oral TID BM  . mupirocin ointment  1 application Nasal BID  . potassium chloride      . rifampin  300 mg Oral Q12H  . sodium bicarbonate      . sodium chloride  500 mL Intravenous STAT  . sodium chloride  10-40 mL Intracatheter Q12H  . vancomycin  1,000 mg Intravenous Q48H    Objective: Temp:  [97.5 F (36.4 C)-99.1 F (37.3 C)] 97.5 F (36.4 C) (02/05 0939) Pulse Rate:  [50-74] 70 (02/05 0501) Resp:  [15-29] 29 (02/05 0501) BP: (105-139)/(43-85) 138/43 mmHg (02/05 0501) SpO2:  [98 %-100 %] 100 % (02/05 0501) Weight:  [77.9 kg (171 lb 11.8 oz)-77.973 kg (171 lb 14.4 oz)] 77.9 kg (171 lb 11.8 oz) (02/05 0939)   Lab Results Lab Results  Component Value Date   WBC 26.7* 11/08/2013   HGB 7.7* 11/08/2013   HCT 21.5* 11/08/2013   MCV 83.2 11/08/2013   PLT 88* 11/08/2013    Lab Results  Component Value Date   CREATININE 2.98* 11/09/2013   BUN 75* 11/09/2013   NA 125* 11/09/2013   K 3.7 11/09/2013   CL 96 11/09/2013   CO2 12* 11/09/2013    Lab Results  Component Value Date   ALT 10 11/07/2013   AST 23 11/07/2013   ALKPHOS 107 11/07/2013   BILITOT 1.0 11/07/2013      Microbiology: Recent Results (from  the past 240 hour(s))  URINE CULTURE     Status: None   Collection Time    11/04/13 11:50 AM      Result Value Range Status   Specimen Description URINE, CLEAN CATCH   Final   Special Requests NONE   Final   Culture  Setup Time     Final   Value: 11/04/2013 21:17     Performed at [facility]     Final   Value: 65,000 COLONIES/ML     Performed at [facility]   Culture     Final   Value: METHICILLIN RESISTANT STAPHYLOCOCCUS AUREUS     Note: RIFAMPIN AND GENTAMICIN SHOULD NOT BE USED AS SINGLE DRUGS FOR TREATMENT OF STAPH INFECTIONS. CRITICAL RESULT CALLED TO, READ BACK BY AND VERIFIED WITH: JESSICA C@3 :05PM ON 11/06/13 BY DANTS     Performed at [facility]   Report Status 11/06/2013 FINAL   Final   Organism ID, Bacteria METHICILLIN RESISTANT STAPHYLOCOCCUS AUREUS   Final  STOOL CULTURE     Status: None   Collection Time    11/04/13  3:55 PM      Result Value Range Status  Specimen Description STOOL   Final   Special Requests NONE   Final   Culture     Final   Value: NO SALMONELLA, SHIGELLA, CAMPYLOBACTER, YERSINIA, OR E.COLI 0157:H7 ISOLATED     Performed at [facility]   Report Status 11/08/2013 FINAL   Final  CLOSTRIDIUM DIFFICILE BY PCR     Status: None   Collection Time    11/04/13  3:55 PM      Result Value Range Status   C difficile by pcr NEGATIVE  NEGATIVE Final  CULTURE, BLOOD (ROUTINE X 2)     Status: None   Collection Time    11/04/13  5:13 PM      Result Value Range Status   Specimen Description BLOOD LEFT HAND   Final   Special Requests BOTTLES DRAWN AEROBIC AND ANAEROBIC 10CC EACH   Final   Culture  Setup Time     Final   Value: 11/05/2013 20:50     Performed at [facility]   Culture     Final   Value: METHICILLIN RESISTANT STAPHYLOCOCCUS AUREUS     Note: RIFAMPIN AND GENTAMICIN SHOULD NOT BE USED AS SINGLE DRUGS FOR TREATMENT OF STAPH INFECTIONS. CRITICAL RESULT CALLED TO, READ BACK BY AND VERIFIED  WITH: JESSICA CHILDRESS@0913  ON [id number] BY [facility]     Note: Gram Stain Report Called to,Read Back By and Verified With: HAWKINS M AT 0729A ON [id number] BY THOMPSON S Performed at [facility]     Performed at [facility]   Report Status 11/07/2013 FINAL   Final   Organism ID, Bacteria METHICILLIN RESISTANT STAPHYLOCOCCUS AUREUS   Final  CULTURE, BLOOD (ROUTINE X 2)     Status: None   Collection Time    11/04/13  5:19 PM      Result Value Range Status   Specimen Description BLOOD RIGHT ARM   Final   Special Requests BOTTLES DRAWN AEROBIC AND ANAEROBIC 10CC EACH   Final   Culture  Setup Time     Final   Value: 11/05/2013 20:50     Performed at [facility]   Culture     Final   Value: STAPHYLOCOCCUS AUREUS     Note: SUSCEPTIBILITIES PERFORMED ON PREVIOUS CULTURE WITHIN THE LAST 5 DAYS.     Note: Gram Stain Report Called to,Read Back By and Verified With: HAWKINS M AT 0729A ON [id number] BY THOMPSON S Performed at [facility]     Performed at [facility]   Report Status 11/07/2013 FINAL   Final  CULTURE, BLOOD (ROUTINE X 2)     Status: None   Collection Time    11/06/13 12:28 PM      Result Value Range Status   Specimen Description BLOOD RIGHT HAND   Final   Special Requests     Final   Value: BOTTLES DRAWN AEROBIC AND ANAEROBIC 8CC EACH BOTTLE   Culture NO GROWTH 3 DAYS   Final   Report Status PENDING   Incomplete  MRSA PCR SCREENING     Status: Abnormal   Collection Time    11/06/13 12:30 PM      Result Value Range Status   MRSA by PCR POSITIVE (*) NEGATIVE Final   Comment:            The GeneXpert MRSA Assay (FDA     approved for NASAL specimens     only), is one component of a     comprehensive  MRSA colonization     surveillance program. It is not     intended to diagnose MRSA     infection nor to guide or     monitor treatment for     MRSA infections.     RESULT CALLED TO, READ BACK BY AND VERIFIED WITH:     CRUISE,J. AT [id number] ON  11/06/2013 BY BAUGHAM,M.  CULTURE, BLOOD (ROUTINE X 2)     Status: None   Collection Time    11/06/13  1:30 PM      Result Value Range Status   Specimen Description BLOOD RIGHT HAND   Final   Special Requests     Final   Value: BOTTLES DRAWN AEROBIC AND ANAEROBIC AEB=6CC ANA=4CC   Culture  Setup Time     Final   Value: 11/08/2013 01:26     Performed at [facility]   Culture     Final   Value: STAPHYLOCOCCUS AUREUS     Note: RIFAMPIN AND GENTAMICIN SHOULD NOT BE USED AS SINGLE DRUGS FOR TREATMENT OF STAPH INFECTIONS.     Note: Performed at [facility] Gram Stain Report Called to,Read Back By and Verified With: [place] [id number] ON 11/07/13 BY Elza Rafter     Performed at [facility]   Report Status PENDING   Incomplete    Studies/Results: Dg Foot 2 Views Left  11/08/2013   CLINICAL DATA:  Chronic ulcer  EXAM: LEFT FOOT - 2 VIEW  COMPARISON:  None.  FINDINGS: There is a skin defect medial to the head of the first metatarsal. There is cortical demineralization in the adjacent head of the first metatarsal. No definite involvement of the subchondral cortex or MTP joint. There is a hallux valgus deformity. Negative for fracture. Otherwise normal mineralization and alignment. Small calcaneal spur at the plantar aponeurosis.  IMPRESSION: 1. Demineralization in the lateral aspect head first metatarsal, with an overlying soft tissue defect, suggesting osteomyelitis.   Electronically Signed   By: Arne Cleveland M.D.   On: 11/08/2013 21:46    Assessment: Ms. Milkey is currently in the Cath Lab for TEE and ICD extraction. Device in all leads are removed and I will stop rifampin and continue vancomycin alone.  Plan: 1. Continue current antibiotics for now 2. Review TEE and operative note  Michel Bickers, MD [facility] for Infectious [place] [phone] pager   [phone] cell 11/09/2013, 10:22 AM

## 2013-11-09 NOTE — Anesthesia Preprocedure Evaluation (Signed)
Anesthesia Evaluation  Patient identified by MRN, date of birth, ID band Patient awake    Reviewed: Allergy & Precautions, H&P , NPO status , Patient's Chart, lab work & pertinent test results, reviewed documented beta blocker date and time   Airway Mallampati: II TM Distance: >3 FB Neck ROM: full    Dental   Pulmonary neg pulmonary ROS,  breath sounds clear to auscultation        Cardiovascular hypertension, Pt. on medications and Pt. on home beta blockers + CAD, + Peripheral Vascular Disease and +CHF + dysrhythmias + pacemaker + Cardiac Defibrillator + Valvular Problems/Murmurs Rhythm:regular     Neuro/Psych PSYCHIATRIC DISORDERS Anxiety Depression negative neurological ROS  negative psych ROS   GI/Hepatic negative GI ROS, Neg liver ROS,   Endo/Other  diabetes, Insulin Dependent  Renal/GU CRFRenal disease  negative genitourinary   Musculoskeletal   Abdominal   Peds  Hematology negative hematology ROS (+) anemia ,   Anesthesia Other Findings See surgeon's H&P   Reproductive/Obstetrics negative OB ROS                           Anesthesia Physical Anesthesia Plan  ASA: IV  Anesthesia Plan: General   Post-op Pain Management:    Induction: Intravenous  Airway Management Planned: Oral ETT  Additional Equipment:   Intra-op Plan:   Post-operative Plan: Extubation in OR  Informed Consent: I have reviewed the patients History and Physical, chart, labs and discussed the procedure including the risks, benefits and alternatives for the proposed anesthesia with the patient or authorized representative who has indicated his/her understanding and acceptance.   Dental Advisory Given  Plan Discussed with: CRNA and Surgeon  Anesthesia Plan Comments:         Anesthesia Quick Evaluation

## 2013-11-09 NOTE — CV Procedure (Addendum)
ICD system extraction performed with procedure complicated by hypotension due to septic emboli. Full note to follow. [id number].

## 2013-11-09 NOTE — Interval H&P Note (Signed)
History and Physical Interval Note: Patient seen and examined. Agree with Dr. Olin Pia note. I saw the patient last night. She has MRSA bacteremia and a 23.67 year old BiV ICD. I have carefully explained the urgent indication for extraction of her ICD system and she wishes to proceed. In addition, I have spoken to Dr. Cyndia Bent who will provide surgical back up.   11/09/2013 7:15 AM  [name]  has presented today for surgery, with the diagnosis of Infected ICD lead  The various methods of treatment have been discussed with the patient and family. After consideration of risks, benefits and other options for treatment, the patient has consented to  Procedure(s) with comments: ICD LEAD REMOVAL (N/A) - TCTS request 0800.  Dr. Lovena Le to clear schedule to accomodate case.  B[place]le potential back-up as a surgical intervention .  The patient's history has been reviewed, patient examined, no change in status, stable for surgery.  I have reviewed the patient's chart and labs.  Questions were answered to the patient's satisfaction.     [name].D.

## 2013-11-09 NOTE — Progress Notes (Signed)
NUTRITION CONSULT / FOLLOW-UP   DOCUMENTATION CODES Per approved criteria  -Not Applicable   INTERVENTION:  Utilize 32M PEPuP Protocol: initiate TF via OGT with Vital AF 1.2 at 25 ml/h on day 1; on day 2, increase to goal rate of 55 ml/h (1320 ml per day) to provide 1584 kcals, 99 gm protein, 1071 ml free water daily.  NUTRITION DIAGNOSIS: Inadequate oral intake related to inability to eat as evidenced by NPO status.   Goal: Intake to meet >90% of estimated nutrition needs.  Monitor:  TF tolerance/adequacy, weight trend, labs, vent status.  Reason for Assessment: MD Consult for TF initiation and management.  68 y.o. female  Admitting Dx: MRSA bacteremia  ASSESSMENT: Patient is a 68 yo female with history of DM 2, HTN, CHF (EF 20%), CAD, PAD, ICD, depression who presented to [place] on 1/31 for hyperglycemia and failure to thrive, also with fever. Admission blood cultures noted to be positive for MRSA. Patient was transferred to [facility] from [place] for further management. With her history of ICD in place cardiology was consulted for evaluation and proceeded to remove the ICD this morning. Following the removal of the ICD the patient became hypotensive believed to be due to septic emboli. Required intubation on 2/5.  Patient is currently intubated on ventilator support.  MV: 12 L/min Temp (24hrs), Avg:98.3 F (36.8 C), Min:97.5 F (36.4 C), Max:99.1 F (37.3 C)   Height: Ht Readings from Last 1 Encounters:  11/09/13 5\' 3"  (1.6 m)    Weight: Wt Readings from Last 1 Encounters:  11/09/13 171 lb 8.3 oz (77.8 kg)  11/08/13  170 lb 10.2 oz (77.4 kg) 11/07/13 167 lb 1.7 oz (75.8 kg)  11/06/13  158 lb 11.7 oz (72 kg)  11/05/13  158 lb 15.2 oz (72.1 kg)  11/04/13  161 lb (73.029 kg)    Ideal Body Weight: 52.3 kg  % Ideal Body Weight: 138%  Wt Readings from Last 10 Encounters:  11/09/13 171 lb 8.3 oz (77.8 kg)  11/09/13 171 lb 8.3 oz (77.8 kg)  11/09/13 171 lb 8.3 oz  (77.8 kg)  09/19/13 158 lb (71.668 kg)  06/12/13 160 lb 12 oz (72.916 kg)  05/09/13 162 lb 0.6 oz (73.5 kg)  05/09/13 162 lb 0.6 oz (73.5 kg)  05/01/13 165 lb (74.844 kg)  03/27/13 165 lb (74.844 kg)  03/06/13 184 lb (83.462 kg)    Usual Body Weight: 160-165 lb  % Usual Body Weight: 99%  BMI:  28.5 (using admission weight of 161 lb)  Estimated Nutritional Needs: Kcal: 1575 Protein: 85-100 gm Fluid: 1.6-1.8 L  Skin: stage 2 pressure ulcer on left foot  Diet Order: NPO  EDUCATION NEEDS: -Education not appropriate at this time   Intake/Output Summary (Last 24 hours) at 11/09/13 1546 Last data filed at 11/09/13 1300  Gross per 24 hour  Intake 1587.33 ml  Output   1175 ml  Net 412.33 ml    Last BM: 1/31   Labs:   Recent Labs Lab 11/09/13 0315 11/09/13 0620 11/09/13 1230  NA 124* 125* 130*  K 4.2 3.7 3.8  CL 97 96 101  CO2 10* 12* 13*  BUN 76* 75* 75*  CREATININE 3.01* 2.98* 2.69*  CALCIUM 7.5* 7.4* 8.7  GLUCOSE 252* 265* 182*    CBG (last 3)   Recent Labs  11/09/13 0657 11/09/13 0938 11/09/13 1016  GLUCAP 228* 158* 162*    Scheduled Meds: . Chlorhexidine Gluconate Cloth  6 each Topical Q0600  .  feeding supplement (VITAL HIGH PROTEIN)  1,000 mL Per Tube Q24H  . gentamicin irrigation  80 mg Irrigation On Call  . insulin aspart  0-20 Units Subcutaneous Q4H  . insulin glargine  15 Units Subcutaneous Q24H  . mupirocin ointment  1 application Nasal BID  . potassium chloride      . rifampin  300 mg Oral Q12H  .  sodium bicarbonate 150 mEq in sterile water 1000 mL infusion   Intravenous Once  . sodium chloride  500 mL Intravenous STAT  . sodium chloride  10-40 mL Intracatheter Q12H  . vancomycin  1,000 mg Intravenous Q48H    Continuous Infusions: . sodium chloride    . dextrose    . dextrose 5 % and 0.9% NaCl 100 mL/hr at 11/08/13 1048  . DOPamine Stopped (11/09/13 1151)  . epinephrine Stopped (11/09/13 1013)  . fentaNYL infusion INTRAVENOUS  100 mcg/hr (11/09/13 1145)  . norepinephrine (LEVOPHED) Adult infusion Stopped (11/09/13 1027)  . vasopressin (PITRESSIN) infusion - *FOR SHOCK*      Past Medical History  Diagnosis Date  . Diabetes mellitus, type 2   . Essential hypertension, benign   . Chronic systolic heart failure   . Coronary atherosclerosis of native coronary artery     Nonobstructive  . Nonischemic cardiomyopathy     LVEF 20%  . PAD (peripheral artery disease)     Left foot gangrene  . Anxiety   . Depression   . ICD (implantable cardiac defibrillator) in place     Past Surgical History  Procedure Laterality Date  . Abdominal hysterectomy    . Left shoulder surgery      Rotator Cuff  . Breast lumpectomy      Bil  . Cardiac defibrillator placement      Medtronic [id number]   . Exploratory laparotomy with lysis of adhesions    . Bladder surgery  2011 BENIGN MASS REMOVED FROM BLADDER  . Eye surgery  BILATERAL CATARACTS REMOVAL  10 YEARS AGO PER PATIENT  . Pr vein bypass graft,aorto-fem-pop  05-2011    Left popliteal- PTA graft   . Viterectomy  11/10/2012    OD   Dr Zigmund Daniel  . 25 gauge pars plana vitrectomy with 20 gauge mvr port for macular hole Right 11/10/2012    Procedure: 71 GAUGE PARS PLANA VITRECTOMY WITH 20 GAUGE MVR PORT FOR MACULAR HOLE;  Surgeon: Hayden Pedro, MD;  Location: [facility];  Service: Ophthalmology;  Laterality: Right;  . Gas insertion Right 11/10/2012    Procedure: INSERTION OF GAS;  Surgeon: Hayden Pedro, MD;  Location: [facility];  Service: Ophthalmology;  Laterality: Right;  C3F8  . Serum patch Right 11/10/2012    Procedure: SERUM PATCH;  Surgeon: Hayden Pedro, MD;  Location: [facility];  Service: Ophthalmology;  Laterality: Right;  . 25 gauge pars plana vitrectomy with 20 gauge mvr port for macular hole Right 05/09/2013    Procedure: 66 GAUGE PARS PLANA VITRECTOMY WITH 20 GAUGE MVR PORT FOR MACULAR HOLE;  Surgeon: Hayden Pedro, MD;  Location: [facility];  Service: Ophthalmology;  Laterality:  Right;  . Gas/fluid exchange Right 05/09/2013    Procedure: GAS/FLUID EXCHANGE;  Surgeon: Hayden Pedro, MD;  Location: [facility];  Service: Ophthalmology;  Laterality: Right;  C3F8   . Serum patch Right 05/09/2013    Procedure: SERUM PATCH;  Surgeon: Hayden Pedro, MD;  Location: [facility];  Service: Ophthalmology;  Laterality: Right;  . Laser photo ablation Right 05/09/2013  Procedure: LASER PHOTO ABLATION;  Surgeon: Hayden Pedro, MD;  Location: [facility];  Service: Ophthalmology;  Laterality: Right;  Endolaser   . Membrane peel Right 05/09/2013    Procedure: MEMBRANE PEEL;  Surgeon: Hayden Pedro, MD;  Location: [facility];  Service: Ophthalmology;  Laterality: Right;    Molli Barrows, RD, LDN, [place] Pager [phone] After Hours Pager [phone]

## 2013-11-09 NOTE — Progress Notes (Signed)
    Patient: [name] Date of Encounter: 11/09/2013, 6:56 AM Admit date: 11/04/2013     Subjective  [name] reports an episode of SOB overnight. This resolved within minutes. She denies CP.    Objective  Physical Exam: Vitals: BP 138/43  Pulse 70  Temp(Src) 98.9 F (37.2 C) (Oral)  Resp 29  Ht 5\' 1"  (1.549 m)  Wt 171 lb 14.4 oz (77.973 kg)  BMI 32.50 kg/m2  SpO2 100% General: Well developed 68 year old female in no acute distress. Neck: Supple. JVD not elevated. Lungs: Clear bilaterally to auscultation without wheezes, rales, or rhonchi. Breathing is unlabored. Heart: RRR S1 S2 without murmurs, rubs, or gallops.  Abdomen: Soft, non-distended. Extremities: No clubbing or cyanosis. No edema.   Neuro: Alert and oriented X 3. Moves all extremities spontaneously. No focal deficits.  Intake/Output:  Intake/Output Summary (Last 24 hours) at 11/09/13 0656 Last data filed at 11/08/13 1955  Gross per 24 hour  Intake    500 ml  Output      0 ml  Net    500 ml    Inpatient Medications:  . Chlorhexidine Gluconate Cloth  6 each Topical Q0600  . feeding supplement (RESOURCE BREEZE)  1 Container Oral TID BM  . mupirocin ointment  1 application Nasal BID  . potassium chloride  10 mEq Intravenous Q1H  . potassium chloride      . rifampin  300 mg Oral Q12H  . sodium chloride  10-40 mL Intracatheter Q12H  . vancomycin  1,000 mg Intravenous Q48H   . sodium chloride    . dextrose 5 % and 0.45% NaCl 75 mL/hr at 11/09/13 0559  . dextrose 5 % and 0.9% NaCl 100 mL/hr at 11/08/13 1048  . insulin (NOVOLIN-R) infusion 1.8 Units/hr (11/09/13 0600)    Labs:  Recent Labs  11/08/13 2045 11/09/13 0315  NA 126* 124*  K 4.0 4.2  CL 96 97  CO2 13* 10*  GLUCOSE 188* 252*  BUN 78* 76*  CREATININE 3.36* 3.01*  CALCIUM 7.7* 7.5*    Recent Labs  11/07/13 0430  AST 23  ALT 10  ALKPHOS 107  BILITOT 1.0  PROT 6.0  ALBUMIN 1.9*    Recent Labs  11/07/13 1420  11/08/13 0433 11/08/13 0719  WBC 24.4* 26.7*  --   NEUTROABS 20.3* 22.2*  --   HGB 7.9* 8.0* 7.7*  HCT 22.7* 22.7* 21.5*  MCV 82.2 83.2  --   PLT 86* 88*  --     Recent Labs  11/06/13 1400  HGBA1C 10.5*    Recent Labs  11/07/13 1853  INR 1.27    Radiology/Studies: Dg Chest Port 1 View 11/06/2013     FINDINGS: Cardiac shadow is stable. A defibrillator is again seen. A new right-sided PICC line is noted with the catheter tip in the mid right atrium. The lungs are clear. No acute bony abnormality is noted.   IMPRESSION: New right PICC line with the tip in the mid right atrium.    Electronically Signed   By: Inez Catalina M.D.   On: 11/06/2013 15:02   Telemetry: AV paced; brief NSVT   Assessment and Plan  1. MRSA bacteremia - awaiting TEE then CRT-D system explant this AM 2. NICM s/p CRT-D implant and CRT responder 3. Acute on chronic renal failure 4. Anemia 5. PAD  Signed, EDMISTEN, BROOKE PA-C  EP Attending  Patient seen and examined. Agree with above.  Mikle Bosworth.D.

## 2013-11-09 NOTE — Addendum Note (Signed)
Addendum created 11/09/13 1116 by Fulton Reek, MD   Modules edited: Anesthesia Events

## 2013-11-09 NOTE — Progress Notes (Signed)
Echocardiogram Echocardiogram Transesophageal has been performed.  Joelene Millin 11/09/2013, 8:47 AM

## 2013-11-09 NOTE — Progress Notes (Signed)
NAMEZARAIAH, BURNARD            ACCOUNT NO.:  [id number]  MEDICAL RECORD NO.:  [id number]  LOCATION:  2C09C                        FACILITY:  [facility]  PHYSICIAN:  Shardai Star G. Everette Rank, MD   DATE OF BIRTH:  02-03-1946  DATE OF PROCEDURE: DATE OF DISCHARGE:                                PROGRESS NOTE   HISTORY OF PRESENT ILLNESS:  This patient is alert and oriented today. Her sugars have run from 144 to 132.  Her current hemoglobin is 8.  She does have gram-positive cocci and blood in MRSA and urine MRSA thought to be in blood as well.  She is placed on Ancef, rifampin, and vancomycin protocol.  Also was seen by Dr. Dorris Fetch, endocrinologist.  Her low potassium has responded some and is now 3.4.  PHYSICAL EXAMINATION:  GENERAL:  The patient is alert and oriented. VITAL SIGNS:  Blood pressure 93/50, respirations 21, pulse 54, temp 98.5. HEENT:  Eyes PERRLA, TMs negative.  Oropharynx benign. NECK:  Supple.  No JVD or thyroid abnormalities. LUNGS:  Clear to P and A. HEART:  Regular rhythm.  No murmurs. ABDOMEN:  No palpable organs or masses. EXTREMITIES:  The patient does have swelling and tenderness of the right knee and chronic wound on the medial side of left foot and toe.  ASSESSMENT: 1. Diabetes mellitus with hyperglycemia, improved with current insulin     treatment. 2. Sepsis with gram-positive cocci in blood and urine, which happens     to be MRSA.  To continue current antibiotic regimen; vancomycin,     Ancef, and rifampin. 3. The patient has chronic systolic heart failure and renal failure,     stage II.  She had been started back on furosemide.  We will     continue current regimen.  If hemoglobin is under 8, we will     transfuse.  The patient will be seen by Dr. Lowanda Foster as well of     Nephrology.     Khrystyna Schwalm G. Everette Rank, MD     AGM/MEDQ  D:  11/08/2013  T:  11/09/2013  Job:  [id number]

## 2013-11-10 ENCOUNTER — Inpatient Hospital Stay (HOSPITAL_COMMUNITY): Payer: Medicare Other

## 2013-11-10 DIAGNOSIS — I079 Rheumatic tricuspid valve disease, unspecified: Secondary | ICD-10-CM

## 2013-11-10 DIAGNOSIS — I10 Essential (primary) hypertension: Secondary | ICD-10-CM

## 2013-11-10 DIAGNOSIS — I70219 Atherosclerosis of native arteries of extremities with intermittent claudication, unspecified extremity: Secondary | ICD-10-CM

## 2013-11-10 LAB — URINE CULTURE
CULTURE: NO GROWTH
Colony Count: NO GROWTH
Special Requests: NORMAL

## 2013-11-10 LAB — POCT I-STAT 3, ART BLOOD GAS (G3+)
ACID-BASE DEFICIT: 9 mmol/L — AB (ref 0.0–2.0)
ACID-BASE DEFICIT: 8 mmol/L — AB (ref 0.0–2.0)
ACID-BASE DEFICIT: 8 mmol/L — AB (ref 0.0–2.0)
BICARBONATE: 16.7 meq/L — AB (ref 20.0–24.0)
Bicarbonate: 16.8 mEq/L — ABNORMAL LOW (ref 20.0–24.0)
Bicarbonate: 17.4 mEq/L — ABNORMAL LOW (ref 20.0–24.0)
O2 SAT: 99 %
O2 Saturation: 98 %
O2 Saturation: 96 %
PCO2 ART: 34.5 mmHg — AB (ref 35.0–45.0)
Patient temperature: 98.9
TCO2: 18 mmol/L (ref 0–100)
TCO2: 18 mmol/L (ref 0–100)
TCO2: 18 mmol/L (ref 0–100)
pCO2 arterial: 33.5 mmHg — ABNORMAL LOW (ref 35.0–45.0)
pCO2 arterial: 28.9 mmHg — ABNORMAL LOW (ref 35.0–45.0)
pH, Arterial: 7.297 — ABNORMAL LOW (ref 7.350–7.450)
pH, Arterial: 7.325 — ABNORMAL LOW (ref 7.350–7.450)
pH, Arterial: 7.369 (ref 7.350–7.450)
pO2, Arterial: 111 mmHg — ABNORMAL HIGH (ref 80.0–100.0)
pO2, Arterial: 125 mmHg — ABNORMAL HIGH (ref 80.0–100.0)
pO2, Arterial: 79 mmHg — ABNORMAL LOW (ref 80.0–100.0)

## 2013-11-10 LAB — BASIC METABOLIC PANEL
BUN: 73 mg/dL — ABNORMAL HIGH (ref 6–23)
CALCIUM: 7.8 mg/dL — AB (ref 8.4–10.5)
CHLORIDE: 101 meq/L (ref 96–112)
CO2: 16 meq/L — AB (ref 19–32)
Creatinine, Ser: 3.27 mg/dL — ABNORMAL HIGH (ref 0.50–1.10)
GFR calc Af Amer: 16 mL/min — ABNORMAL LOW (ref >90)
GFR calc non Af Amer: 14 mL/min — ABNORMAL LOW (ref >90)
GLUCOSE: 130 mg/dL — AB (ref 70–99)
Potassium: 3.8 mEq/L (ref 3.7–5.3)
Sodium: 132 mEq/L — ABNORMAL LOW (ref 137–147)

## 2013-11-10 LAB — GLUCOSE, CAPILLARY
GLUCOSE-CAPILLARY: 111 mg/dL — AB (ref 70–99)
GLUCOSE-CAPILLARY: 138 mg/dL — AB (ref 70–99)
GLUCOSE-CAPILLARY: 203 mg/dL — AB (ref 70–99)
Glucose-Capillary: 176 mg/dL — ABNORMAL HIGH (ref 70–99)
Glucose-Capillary: 172 mg/dL — ABNORMAL HIGH (ref 70–99)
Glucose-Capillary: 173 mg/dL — ABNORMAL HIGH (ref 70–99)
Glucose-Capillary: 202 mg/dL — ABNORMAL HIGH (ref 70–99)
Glucose-Capillary: 254 mg/dL — ABNORMAL HIGH (ref 70–99)
Glucose-Capillary: 249 mg/dL — ABNORMAL HIGH (ref 70–99)

## 2013-11-10 LAB — MAGNESIUM
Magnesium: 2.3 mg/dL (ref 1.5–2.5)
Magnesium: 2.1 mg/dL (ref 1.5–2.5)

## 2013-11-10 LAB — CBC
HCT: 22.6 % — ABNORMAL LOW (ref 36.0–46.0)
Hemoglobin: 8.1 g/dL — ABNORMAL LOW (ref 12.0–15.0)
MCH: 29.8 pg (ref 26.0–34.0)
MCHC: 35.8 g/dL (ref 30.0–36.0)
MCV: 83.1 fL (ref 78.0–100.0)
Platelets: 134 10*3/uL — ABNORMAL LOW (ref 150–400)
RBC: 2.72 MIL/uL — ABNORMAL LOW (ref 3.87–5.11)
RDW: 14.9 % (ref 11.5–15.5)
WBC: 24.4 10*3/uL — AB (ref 4.0–10.5)

## 2013-11-10 LAB — HEPATIC FUNCTION PANEL
ALK PHOS: 109 U/L (ref 39–117)
AST: 37 U/L (ref 0–37)
Albumin: 1.9 g/dL — ABNORMAL LOW (ref 3.5–5.2)
BILIRUBIN DIRECT: 0.7 mg/dL — AB (ref 0.0–0.3)
BILIRUBIN TOTAL: 1.2 mg/dL (ref 0.3–1.2)
Indirect Bilirubin: 0.5 mg/dL (ref 0.3–0.9)
Total Protein: 6.1 g/dL (ref 6.0–8.3)

## 2013-11-10 LAB — CORTISOL: CORTISOL PLASMA: 16.8 ug/dL

## 2013-11-10 LAB — PHOSPHORUS
PHOSPHORUS: 3.5 mg/dL (ref 2.3–4.6)
PHOSPHORUS: 3.6 mg/dL (ref 2.3–4.6)

## 2013-11-10 MED ORDER — HYDROCORTISONE NA SUCCINATE PF 100 MG IJ SOLR
50.0000 mg | Freq: Four times a day (QID) | INTRAMUSCULAR | Status: DC
  Administered 2013-11-10: 50 mg via INTRAVENOUS
  Filled 2013-11-10 (×5): qty 1

## 2013-11-10 MED ORDER — INSULIN GLARGINE 100 UNIT/ML ~~LOC~~ SOLN
15.0000 [IU] | Freq: Every day | SUBCUTANEOUS | Status: DC
  Administered 2013-11-10 – 2013-11-20 (×10): 15 [IU] via SUBCUTANEOUS
  Filled 2013-11-10 (×11): qty 0.15

## 2013-11-10 MED ORDER — SODIUM BICARBONATE 8.4 % IV SOLN
INTRAVENOUS | Status: DC
  Administered 2013-11-10 – 2013-11-11 (×2): via INTRAVENOUS
  Filled 2013-11-10 (×2): qty 150

## 2013-11-10 MED ORDER — BIOTENE DRY MOUTH MT LIQD
15.0000 mL | Freq: Four times a day (QID) | OROMUCOSAL | Status: DC
  Administered 2013-11-10 – 2013-11-20 (×35): 15 mL via OROMUCOSAL

## 2013-11-10 MED ORDER — PANTOPRAZOLE SODIUM 40 MG PO TBEC
40.0000 mg | DELAYED_RELEASE_TABLET | Freq: Every day | ORAL | Status: DC
  Administered 2013-11-10 – 2013-11-14 (×5): 40 mg via ORAL
  Filled 2013-11-10 (×5): qty 1

## 2013-11-10 MED ORDER — CHLORHEXIDINE GLUCONATE 0.12 % MT SOLN
15.0000 mL | Freq: Two times a day (BID) | OROMUCOSAL | Status: DC
  Administered 2013-11-10 – 2013-11-12 (×5): 15 mL via OROMUCOSAL
  Filled 2013-11-10 (×3): qty 15

## 2013-11-10 MED ORDER — HYDRALAZINE HCL 20 MG/ML IJ SOLN
10.0000 mg | INTRAMUSCULAR | Status: DC | PRN
  Administered 2013-11-10 – 2013-11-18 (×7): 10 mg via INTRAVENOUS
  Filled 2013-11-10 (×6): qty 1

## 2013-11-10 MED ORDER — HYDRALAZINE HCL 50 MG PO TABS
100.0000 mg | ORAL_TABLET | Freq: Three times a day (TID) | ORAL | Status: DC
  Administered 2013-11-10 – 2013-11-13 (×9): 100 mg via ORAL
  Filled 2013-11-10 (×12): qty 2

## 2013-11-10 MED ORDER — NICARDIPINE HCL IN NACL 20-0.86 MG/200ML-% IV SOLN
3.0000 mg/h | INTRAVENOUS | Status: DC
  Administered 2013-11-10 (×2): 5 mg/h via INTRAVENOUS
  Filled 2013-11-10 (×6): qty 200

## 2013-11-10 NOTE — Progress Notes (Signed)
Inpatient Diabetes Program Recommendations  AACE/ADA: New Consensus Statement on Inpatient Glycemic Control (2013)  Target Ranges:  Prepandial:   less than 140 mg/dL      Peak postprandial:   less than 180 mg/dL (1-2 hours)      Critically ill patients:  140 - 180 mg/dL   Reason for Assessment:Results for ELNA, BRIESKE (MRN [id number]) as of 11/10/2013 13:15  Ref. Range 11/10/2013 08:21 11/10/2013 11:55  Glucose-Capillary Latest Range: 70-99 mg/dL 202 (H) 254 (H)   CBG's trending up after discontinuation of insulin drip.  Diabetes history: Type 2 diabetes Outpatient Diabetes medications: Lantus 45 units daily, Novolog sliding scale Current orders for Inpatient glycemic control: Novolog resistant q 4 hours.   Consider adding Lantus 20 units daily.  If CBG's remain elevated, may consider Novolog tube feed coverage 3 units q 4 hours.     Will follow. Adah Perl, RN, BC-ADM Inpatient Diabetes Coordinator Pager [phone]

## 2013-11-10 NOTE — Progress Notes (Signed)
ANTIBIOTIC CONSULT NOTE - FOLLOW UP  Pharmacy Consult for Vancomycin Indication: MRSA bacteremia  Allergies  Allergen Reactions  . Hydrocodone-Acetaminophen Other (See Comments)    hallucinations    Patient Measurements: Height: 5\' 3"  (160 cm) Weight: 177 lb 4 oz (80.4 kg) IBW/kg (Calculated) : 52.4 Adjusted Body Weight:    Vital Signs: Temp: 98.3 F (36.8 C) (02/06 1155) Temp src: Oral (02/06 1155) BP: 148/43 mmHg (02/06 1100) Pulse Rate: 56 (02/06 1000) Intake/Output from previous day: 02/05 0701 - 02/06 0700 In: 2628.8 [I.V.:1782.2; NG/GT:346.7; IV Piggyback:500] Out: 1510 [Urine:1460; Blood:50] Intake/Output from this shift: Total I/O In: 903.6 [I.V.:268.9; NG/GT:234.7; IV Piggyback:400] Out: 80 [Urine:80]  Labs:  Recent Labs  11/08/13 0433 11/08/13 0719  11/09/13 0620 11/09/13 1230 11/10/13 0500  WBC 26.7*  --   --   --  19.8* 24.4*  HGB 8.0* 7.7*  --   --  8.8* 8.1*  PLT 88*  --   --   --  101* 134*  CREATININE 3.84*  --   < > 2.98* 2.69* 3.27*  < > = values in this interval not displayed. Estimated Creatinine Clearance: 16.8 ml/min (by C-G formula based on Cr of 3.27). No results found for this basename: Letta Median, VANCORANDOM, GENTTROUGH, GENTPEAK, GENTRANDOM, TOBRATROUGH, TOBRAPEAK, TOBRARND, AMIKACINPEAK, AMIKACINTROU, AMIKACIN,  in the last 72 hours   Microbiology: Recent Results (from the past 720 hour(s))  URINE CULTURE     Status: None   Collection Time    11/04/13 11:50 AM      Result Value Range Status   Specimen Description URINE, CLEAN CATCH   Final   Special Requests NONE   Final   Culture  Setup Time     Final   Value: 11/04/2013 21:17     Performed at [place]     Final   Value: 65,000 COLONIES/ML     Performed at [facility]   Culture     Final   Value: METHICILLIN RESISTANT STAPHYLOCOCCUS AUREUS     Note: RIFAMPIN AND GENTAMICIN SHOULD NOT BE USED AS SINGLE DRUGS FOR TREATMENT OF  STAPH INFECTIONS. CRITICAL RESULT CALLED TO, READ BACK BY AND VERIFIED WITH: JESSICA C@3 :05PM ON 11/06/13 BY DANTS     Performed at [facility]   Report Status 11/06/2013 FINAL   Final   Organism ID, Bacteria METHICILLIN RESISTANT STAPHYLOCOCCUS AUREUS   Final  STOOL CULTURE     Status: None   Collection Time    11/04/13  3:55 PM      Result Value Range Status   Specimen Description STOOL   Final   Special Requests NONE   Final   Culture     Final   Value: NO SALMONELLA, SHIGELLA, CAMPYLOBACTER, YERSINIA, OR E.COLI 0157:[id number] ISOLATED     Performed at [facility]   Report Status 11/08/2013 FINAL   Final  CLOSTRIDIUM DIFFICILE BY PCR     Status: None   Collection Time    11/04/13  3:55 PM      Result Value Range Status   C difficile by pcr NEGATIVE  NEGATIVE Final  CULTURE, BLOOD (ROUTINE X 2)     Status: None   Collection Time    11/04/13  5:13 PM      Result Value Range Status   Specimen Description BLOOD LEFT HAND   Final   Special Requests BOTTLES DRAWN AEROBIC AND ANAEROBIC 10CC EACH   Final   Culture  Setup Time     Final   Value: 11/05/2013 20:50     Performed at [facility]   Culture     Final   Value: METHICILLIN RESISTANT STAPHYLOCOCCUS AUREUS     Note: RIFAMPIN AND GENTAMICIN SHOULD NOT BE USED AS SINGLE DRUGS FOR TREATMENT OF STAPH INFECTIONS. CRITICAL RESULT CALLED TO, READ BACK BY AND VERIFIED WITH: JESSICA CHILDRESS@0913  ON [id number] BY [facility]     Note: Gram Stain Report Called to,Read Back By and Verified With: HAWKINS M AT 0729A ON [id number] BY THOMPSON S Performed at [facility]     Performed at [facility]   Report Status 11/07/2013 FINAL   Final   Organism ID, Bacteria METHICILLIN RESISTANT STAPHYLOCOCCUS AUREUS   Final  CULTURE, BLOOD (ROUTINE X 2)     Status: None   Collection Time    11/04/13  5:19 PM      Result Value Range Status   Specimen Description BLOOD RIGHT ARM   Final   Special Requests BOTTLES DRAWN AEROBIC AND  ANAEROBIC 10CC EACH   Final   Culture  Setup Time     Final   Value: 11/05/2013 20:50     Performed at [facility]   Culture     Final   Value: STAPHYLOCOCCUS AUREUS     Note: SUSCEPTIBILITIES PERFORMED ON PREVIOUS CULTURE WITHIN THE LAST 5 DAYS.     Note: Gram Stain Report Called to,Read Back By and Verified With: HAWKINS M AT 0729A ON [id number] BY THOMPSON S Performed at [facility]     Performed at [facility]   Report Status 11/07/2013 FINAL   Final  CULTURE, BLOOD (ROUTINE X 2)     Status: None   Collection Time    11/06/13 12:28 PM      Result Value Range Status   Specimen Description BLOOD RIGHT HAND   Final   Special Requests     Final   Value: BOTTLES DRAWN AEROBIC AND ANAEROBIC 8CC EACH BOTTLE   Culture NO GROWTH 4 DAYS   Final   Report Status PENDING   Incomplete  MRSA PCR SCREENING     Status: Abnormal   Collection Time    11/06/13 12:30 PM      Result Value Range Status   MRSA by PCR POSITIVE (*) NEGATIVE Final   Comment:            The GeneXpert MRSA Assay (FDA     approved for NASAL specimens     only), is one component of a     comprehensive MRSA colonization     surveillance program. It is not     intended to diagnose MRSA     infection nor to guide or     monitor treatment for     MRSA infections.     RESULT CALLED TO, READ BACK BY AND VERIFIED WITH:     CRUISE,J. AT [id number] ON 11/06/2013 BY BAUGHAM,M.  CULTURE, BLOOD (ROUTINE X 2)     Status: None   Collection Time    11/06/13  1:30 PM      Result Value Range Status   Specimen Description BLOOD RIGHT HAND   Final   Special Requests     Final   Value: BOTTLES DRAWN AEROBIC AND ANAEROBIC AEB=6CC ANA=4CC   Culture  Setup Time     Final   Value: 11/08/2013 01:26     Performed at [facility]  Culture     Final   Value: STAPHYLOCOCCUS AUREUS     Note: SUSCEPTIBILITIES PERFORMED ON PREVIOUS CULTURE WITHIN THE LAST 5 DAYS.     Note: Performed at [facility] Gram Stain  Report Called to,Read Back By and Verified With: [place] ON 11/07/13 BY Tyrone Schimke M     Performed at [facility]   Report Status 11/09/2013 FINAL   Final  CULTURE, BLOOD (ROUTINE X 2)     Status: None   Collection Time    11/08/13  8:40 PM      Result Value Range Status   Specimen Description BLOOD PICC LINE   Final   Special Requests BOTTLES DRAWN AEROBIC AND ANAEROBIC 10CC   Final   Culture  Setup Time     Final   Value: 11/09/2013 00:27     Performed at [facility]   Culture     Final   Value:        BLOOD CULTURE RECEIVED NO GROWTH TO DATE CULTURE WILL BE HELD FOR 5 DAYS BEFORE ISSUING A FINAL NEGATIVE REPORT     Performed at [facility]   Report Status PENDING   Incomplete  CULTURE, BLOOD (ROUTINE X 2)     Status: None   Collection Time    11/08/13  8:45 PM      Result Value Range Status   Specimen Description BLOOD LEFT ARM   Final   Special Requests BOTTLES DRAWN AEROBIC AND ANAEROBIC 10CC   Final   Culture  Setup Time     Final   Value: 11/09/2013 00:26     Performed at [facility]   Culture     Final   Value:        BLOOD CULTURE RECEIVED NO GROWTH TO DATE CULTURE WILL BE HELD FOR 5 DAYS BEFORE ISSUING A FINAL NEGATIVE REPORT     Performed at [facility]   Report Status PENDING   Incomplete  URINE CULTURE     Status: None   Collection Time    11/09/13 12:06 PM      Result Value Range Status   Specimen Description URINE, CATHETERIZED   Final   Special Requests Normal   Final   Culture  Setup Time     Final   Value: 11/09/2013 13:57     Performed at [place]     Final   Value: NO GROWTH     Performed at [facility]   Culture     Final   Value: NO GROWTH     Performed at [facility]   Report Status 11/10/2013 FINAL   Final    Anti-infectives   Start     Dose/Rate Route Frequency Ordered Stop   11/09/13 1030  gentamicin (GARAMYCIN) 80 mg in sodium chloride irrigation  0.9 % 500 mL irrigation     80 mg Irrigation On call 11/09/13 1028 11/10/13 1030   11/09/13 1030  vancomycin (VANCOCIN) IVPB 1000 mg/200 mL premix  Status:  Discontinued     1,000 mg 200 mL/hr over 60 Minutes Intravenous On call 11/09/13 1028 11/09/13 1039   11/09/13 0818  gentamicin (GARAMYCIN) 80 mg in sodium chloride irrigation 0.9 % 500 mL irrigation  Status:  Discontinued       As needed 11/09/13 0819 11/09/13 0928   11/09/13 0715  gentamicin (GARAMYCIN) 80 mg in sodium chloride irrigation 0.9 % 500  mL irrigation  Status:  Discontinued      Irrigation Once 11/09/13 0707 11/09/13 0932   11/08/13 1000  vancomycin (VANCOCIN) IVPB 1000 mg/200 mL premix     1,000 mg 200 mL/hr over 60 Minutes Intravenous Every 48 hours 11/07/13 0831     11/06/13 1400  rifampin (RIFADIN) capsule 300 mg     300 mg Oral Every 12 hours 11/06/13 1219     11/06/13 1300  ceFAZolin (ANCEF) IVPB 2 g/50 mL premix  Status:  Discontinued     2 g 100 mL/hr over 30 Minutes Intravenous Every 12 hours 11/06/13 1237 11/07/13 0727   11/06/13 1000  vancomycin (VANCOCIN) IVPB 750 mg/150 ml premix  Status:  Discontinued     750 mg 150 mL/hr over 60 Minutes Intravenous Every 24 hours 11/05/13 1026 11/07/13 0831   11/05/13 1000  vancomycin (VANCOCIN) 1,500 mg in sodium chloride 0.9 % 500 mL IVPB     1,500 mg 250 mL/hr over 120 Minutes Intravenous  Once 11/05/13 0802 11/05/13 1322      Assessment: 59 YOF admit for DKA, fever, and FTT found to have MRSA bacteremia >> ICD removed 2/5 then became hypotensive started on Epi drip and transferred to ICU.  PMH: DM2, HTN, CHF (EF 20%), CAD, PAD, ICD, Depression  Infectious Disease: Abx d#6 for MRSA bacteremia with infected ICD lead>>removed 2/5>>threw septic emboli. LA 1. WBC UP to 24.4  Check LFTs weekly to every 2 weeks on Rifampin.  Vanc 2/1 >> Rifampin 2/2 >>(ID mentioned possibly stopping)  2/5 Urine >>negative 2/4 Blood >>pending 2/2 Blood >> MRSA 1/31 Stool >>  NEG 1/31 Blood >> MRSA (sens vanc, bactrim, rifampin) 1/31 Urine >> 65k MRSA 1/31 Cdiff >> POS  Cardiovascular: TEE , Trop 0.55 (stress?), Meds: Hydralazine only  Endocrinology: CBGs 111-254 on Resistant SSI. Insulin gtt stopped.   Gastrointestinal / Nutrition: Vital TF.   Neurology: Awake  Nephrology: Scr up to 3.27. UOP 0.8. Bicarb drip  Pulmonary:Extubated  Hematology / Oncology: H/H down- received blood 2/4  PTA Medication Issues: Norvasc, ASA, Coreg, Dig, Colace, Lasix, hydralazine, Lantus, Humalog SSI, Cozaar, NTG  Best Practices: SCDs, PPI PT   Goal of Therapy:  Vancomycin trough level 15-20 mcg/ml  Plan:  Continue Vancomycin 1g IV q48h. Continue Rifampin 300mg  IV q12h.   Alford Highland, [employer] 11/10/2013,2:20 PM

## 2013-11-10 NOTE — Procedures (Signed)
Extubation Procedure Note  Patient Details:   Name: [name] DOB: 10-18-1945 MRN: [id number]   Airway Documentation:  Airway 7.5 mm (Active)  Secured at (cm) 23 cm 11/10/2013  8:00 AM  Measured From Lips 11/10/2013  8:00 AM  Secured Location Right 11/10/2013  8:00 AM  Secured By [employer] 11/10/2013  8:00 AM  Tube Holder Repositioned Yes 11/10/2013  8:00 AM  Site Condition Dry 11/10/2013  3:52 AM    Evaluation  O2 sats: stable throughout Complications: No apparent complications Patient did tolerate procedure well. Bilateral Breath Sounds: Rhonchi Suctioning: Airway Yes  Saunders Glance 11/10/2013, 11:16 AM

## 2013-11-10 NOTE — Care Management Note (Addendum)
  Page 2 of 2   11/17/2013     4:10:26 PM   CARE MANAGEMENT NOTE 11/17/2013  Patient:  [name], [name]   Account Number:  [id number]  Date Initiated:  11/08/2013  Documentation initiated by:  Elissa Hefty  Subjective/Objective Assessment:   adm from [place] w ins drip and poss cardiol for tee     Action/Plan:   lives w fam, pcp dr Luster Landsberg [place]cginnis   Anticipated DC Date:  11/17/2013   Anticipated DC Plan:  [place]  CM consult      [place]   Choice offered to / List presented to:  C-1 Patient        [place] arranged  HH-1 RN  [place].   Status of service:  In process, will continue to follow Medicare Important Message given?   (If response is "NO", the following Medicare IM given date fields will be blank) Date Medicare IM given:   Date Additional Medicare IM given:    Discharge Disposition:    Per UR Regulation:  Reviewed for med. necessity/level of care/duration of stay  If discussed at [place] of Stay Meetings, dates discussed:    Comments:  11/17/2013 per SW phone conversation with DTR:  not agreeable to home with HHS and serving as PCG. SW requested DTR discuss her decision with patient so that patient will reconsider CIR or SNF for Rehab needs and medical skilled needs mgmt / IV ABX x 6 weeks. Dispositon:  Pending Santez Woodcox RN, BSN, MSHL, CCM 11/17/2013   11/16/2013 From Home with DTR/Linda and Son/Craig.  Son confirms member has 24 hour supervised care between son/Dtr.  Patient elects to d/c to home with HHS:  RN, PT  (IV ABX x 6 weeks)  rather than LTAC or SNF Provider:  [facility] elected PCG:  Son/Craig Olinde [phone] agrees to ABX teaching BLD transfusion today. ADD:  +1-2 pending Ashlynn Gunnels RN, BSN, MSHL, CCM 11/16/2013  11/14/2013 Hx/o PT ADM TO MICU ON 2/5 WITH SEPTIC SHOCK, RESP FAILURE FOLLOWING REMOVAL OF INFECTED ICD  LEADS. Hx/o Osteomyelitis. Transfer to [facility] 11/14/2013 PT RECS:  CIR DME RECS:  TBD at next venue of care Plan:  Probable IV ABX x 6 weeks. Jarrod Bodkins RN, BSN, MSHL,  CCM 11/14/2013   11/10/13 JULIE AMERSON,RN,BSN [id number] PT ADM TO MICU ON 2/5 WITH SEPTIC SHOCK, RESP FAILURE FOLLOWING REMOVAL OF INFECTED ICD LEADS.  CURRENTLY WEANING VENT.  WILL FOLLOW PROGRESS.   2/4 1210 debbie dowell rn,bsn hx of ahc in past.

## 2013-11-10 NOTE — Progress Notes (Addendum)
Name: [name] MRN: [id number] DOB: Jul 20, 1946    ADMISSION DATE:  11/04/2013 CONSULTATION DATE:  11/09/2013  REFERRING MD :  [facility] PRIMARY SERVICE: PCCM  CHIEF COMPLAINT:  shock  BRIEF PATIENT DESCRIPTION: Patient is a 68 yo female with history of DM 2, HTN, CHF (EF 20%), CAD, PAD, ICD, Depression who presented to the hospital for hyperglycemia and failure to thrive, also with fever. During initial hospitalization she had blood cultures drawn that revealed MRSA. She has been treated with cefazolin and rifampin. She had her ICD removed today for concern for septic source and developed hypotension following the procedure.   SIGNIFICANT EVENTS / STUDIES:  2/1 admission to [facility] for hyperglycemia, on insulin drip  2/1 BCx with MRSA, started on vanc  2/2 ID c/s and rifampin and cefazolin added  2/3 TTE EF 45% global hypokinesis, moderate LVH, grade 1 diastolic dysfunction, shadowing at level of tricuspid valve (vegetation cannot be completely excluded).  2/4 transferred to [facility] from [facility]  2/5- removal wires, shock, ETT 2/5 pressors off  LINES / TUBES: 2/2 PICC >>>  2/5 ETT >>>  2/5 rt cordis>>>  CULTURES: 1/31 Stool Cx >>> no growth  1/31 BCx x2 >>> MRSA  1/31 UCx >>> MRSA  1/31 C diff >>> neg  2/2 Nasal PCR >>> MRSA  2/2 BCx >>> staph aureus, awaiting susceptibilities  2/2 BCx >>> NGTD  2/4 BCx x2 >>>  2/5 UCx >>>   ANTIBIOTICS: 2/2 Vanc >>>  2/2 Rifampin >>>  2/2 cefazolin >>> 2/3   SUBJECTIVE: did well overnight. RR increased from 24 to 30 overnight and tidal volume increased from 400 to 420, came off pressors completely  VITAL SIGNS: Temp:  [97.5 F (36.4 C)-98.9 F (37.2 C)] 98.9 F (37.2 C) (02/06 0400) Pulse Rate:  [42-64] 50 (02/06 0600) Resp:  [13-39] 30 (02/06 0600) BP: (96-152)/(38-91) 113/91 mmHg (02/06 0600) SpO2:  [96 %-100 %] 100 % (02/06 0600) Arterial Line BP: (117-177)/(35-73) 133/35 mmHg (02/06 0600) FiO2 (%):  [40 %-100 %] 40 %  (02/06 0352) Weight:  [171 lb 8.3 oz (77.8 kg)-177 lb 4 oz (80.4 kg)] 177 lb 4 oz (80.4 kg) (02/06 0412) HEMODYNAMICS:   VENTILATOR SETTINGS: Vent Mode:  [-] PRVC FiO2 (%):  [40 %-100 %] 40 % Set Rate:  [18 bmp-30 bmp] 30 bmp Vt Set:  [420 mL-500 mL] 420 mL PEEP:  [5 cmH20] 5 cmH20 Plateau Pressure:  [17 cmH20-21 cmH20] 18 cmH20 INTAKE / OUTPUT: Intake/Output     02/05 0701 - 02/06 0700 02/06 0701 - 02/07 0700   I.V. (mL/kg) 1597.2 (19.9)    Blood     Other     NG/GT 291.7    IV Piggyback 500    Total Intake(mL/kg) 2388.8 (29.7)    Urine (mL/kg/hr) 1460 (0.8)    Blood 50 (0)    Total Output 1510     Net +878.8            PHYSICAL EXAMINATION: General: ETT in place, comfortable appearing  Neuro: wakes up to voice and follows commands, rass score -1  HEENT: ETT in place, [place]  Cardiovascular: rrr, no murmur appreciated  Lungs: clear breath sounds, no wheezing  Abdomen: Soft, ND, hypoactive BS  Musculoskeletal: No gross deformities  Skin: Left 1st MTP with ulceration ~1 cm diameter, mittens in place  LABS:  CBC  Recent Labs Lab 11/08/13 0433 11/08/13 0719 11/09/13 1230 11/10/13 0500  WBC 26.7*  --  19.8* 24.4*  HGB 8.0* 7.7* 8.8* 8.1*  HCT 22.7* 21.5* 24.0* 22.6*  PLT 88*  --  101* 134*   Coag's  Recent Labs Lab 11/04/13 0832 11/07/13 1853 11/09/13 1230  APTT  --   --  37  INR 1.26 1.27 1.51*   BMET  Recent Labs Lab 11/09/13 0620 11/09/13 1230 11/10/13 0500  NA 125* 130* 132*  K 3.7 3.8 3.8  CL 96 101 101  CO2 12* 13* 16*  BUN 75* 75* 73*  CREATININE 2.98* 2.69* 3.27*  GLUCOSE 265* 182* 130*   Electrolytes  Recent Labs Lab 11/09/13 0620 11/09/13 1230 11/10/13 0414 11/10/13 0500  CALCIUM 7.4* 8.7  --  7.8*  MG  --   --  2.3  --   PHOS  --   --  3.5  --    Sepsis Markers  Recent Labs Lab 11/09/13 1230  LATICACIDVEN 1.0   ABG  Recent Labs Lab 11/04/13 0909 11/09/13 1536 11/10/13 0725  PHART 7.430 7.410 7.297*  PCO2ART  28.6* 22.3* 34.5*  PO2ART 109.0* 243.0* 111.0*   Liver Enzymes  Recent Labs Lab 11/04/13 0832 11/07/13 0430  AST 27 23  ALT 12 10  ALKPHOS 108 107  BILITOT 0.4 1.0  ALBUMIN 2.7* 1.9*   Cardiac Enzymes  Recent Labs Lab 11/09/13 1230  TROPONINI 0.55*   Glucose  Recent Labs Lab 11/09/13 0938 11/09/13 1016 11/09/13 1552 11/09/13 1903 11/10/13 0015 11/10/13 0353  GLUCAP 158* 162* 171* 146* 138* 111*    Imaging Dg Chest Port 1 View  11/09/2013   CLINICAL DATA:  Hypoxia  EXAM: PORTABLE CHEST - 1 VIEW  COMPARISON:  November 06, 2013  FINDINGS: Endotracheal tube tip is 3.2 cm above the carina. Central catheter tip is in the superior vena cava near the junction with the right atrium. Nasogastric tube is coiled in the stomach. Pacemaker no longer appreciable. No pneumothorax.  There is no edema or consolidation. Heart is moderately enlarged with normal pulmonary vascularity, stable. No adenopathy.  IMPRESSION: Tube and catheter positions as described without pneumothorax. Stable cardiac enlargement. No edema or consolidation.   Electronically Signed   By: Lowella Grip M.D.   On: 11/09/2013 10:21   Dg Abd Portable 1v  11/09/2013   CLINICAL DATA:  Orogastric tube placement.  EXAM: PORTABLE ABDOMEN - 1 VIEW  COMPARISON:  02/20/2011  FINDINGS: Nasogastric tube curls within the stomach, well positioned.  There is a femoral a placed right iliac vein introducer sheath.  Soft tissues are otherwise unremarkable.  Normal bowel gas pattern.  IMPRESSION: Nasogastric tube is well positioned curling within the stomach.   Electronically Signed   By: Lajean Manes M.D.   On: 11/09/2013 10:27   Dg Foot 2 Views Left  11/08/2013   CLINICAL DATA:  Chronic ulcer  EXAM: LEFT FOOT - 2 VIEW  COMPARISON:  None.  FINDINGS: There is a skin defect medial to the head of the first metatarsal. There is cortical demineralization in the adjacent head of the first metatarsal. No definite involvement of the  subchondral cortex or MTP joint. There is a hallux valgus deformity. Negative for fracture. Otherwise normal mineralization and alignment. Small calcaneal spur at the plantar aponeurosis.  IMPRESSION: 1. Demineralization in the lateral aspect head first metatarsal, with an overlying soft tissue defect, suggesting osteomyelitis.   Electronically Signed   By: Arne Cleveland M.D.   On: 11/08/2013 21:46     ASSESSMENT / PLAN:  PULMONARY  A: acute respiratory failure likely related  to septic emboli, uncompensated metabolic acidosis  P:  Mechanical vantilation FiO2 40, RR 30, tidal volume 420, Peep 5, may need further MV increase unlikely to wean well with acidosis un compensation on baseline settings abg reviewed, would need to repeat if successful on wean done by RT pcxr in am  CARDIOVASCULAR  A: septic Shock / SIRS post removal wires - improved shock state pulmonary septic emboli  S/p ICD removal  SVC thrombus  CHF EF 45%  H/o HTN and worsening Troponin 0.55, likely related to hypotensive episode / demand  Relative AI - cortisol 16.8 P:  If pressor needed would re add dop if brady related  Solucortef 50 q6 - but major HTN, dc roids early Hold home antihypertensives  Cards following  Holding off on neck lines with SVC clot  Avoid epi as able with pos trop Dc cordis groin as picc gives access, Uless for emergent wire in case Add home hydralazine  RENAL  A: Acute on chronic kidney disease, baseline Cr 1.3 - worsened post hypotensive event -ATN Hyponatremia improving  Anion/non-anion gap metabolic acidosis  P:  1/2 NS to avoid worsening non-anion gap Continue bicarb as NON AG remains in setting renal failure AG improved  Follow Cr in am Consider renal US with elevation in Cr  GASTROINTESTINAL  A: tolerating TF P:  PPI GI protection  Continue tube feeds  Monitor for BMs  LFTs normal, no shock liver  HEMATOLOGIC  A: Anemic Leukocytosis in setting of MRSA infection  P:    Monitor CBC  SCDs VTE prophylaxis  Platelet count improved to 134 - start sub-q heparin   INFECTIOUS  A: MRSA bacteremia  Septic endocarditis  Left first MTP ulceration foot XR with concern for osteomylitis  P:  Continue vanc and rifampin (weekly LFt) ID following  F/u repeat BCx  Consider MRI of foot to further evaluate osteo   ENDOCRINE  A: DM2  P:   Monitor cbg Place on SSI  Continue lantus 15 u daily  Steroids dc as HTN worsening  NEUROLOGIC  A: Sedated, follows commands  P:  Fentanyl for sedation and pain control  WUA upright  TODAY'S SUMMARY: continue vent support, weaning well overall, remain soff pressors, keep cordis in case wire needed?   I have personally obtained a history, examined the patient, evaluated laboratory and imaging results, formulated the assessment and plan and placed orders. CRITICAL CARE: The patient is critically ill with multiple organ systems failure and requires high complexity decision making for assessment and support, frequent evaluation and titration of therapies, application of advanced monitoring technologies and extensive interpretation of multiple databases. Critical Care Time devoted to patient care services described in this note is 30 minutes.   Lavon Paganini. Titus Mould, MD, [place] Pgr: [facility]  Pulmonary and [place] Pager: [phone]  11/10/2013, 7:31 AM

## 2013-11-10 NOTE — Progress Notes (Signed)
Patient ID: [name], female   DOB: 14-Mar-1946, 68 y.o.   MRN: [id number]         [facility] for Infectious [facility]    Date of Admission:  11/04/2013   Total days of antibiotics 6         Principal Problem:   MRSA bacteremia Active Problems:   Biventricular implantable cardioverter-defibrillator in situ   HYPERLIPIDEMIA   CORONARY ATHEROSCLEROSIS NATIVE CORONARY ARTERY   Nonischemic cardiomyopathy   Chronic systolic heart failure   PAD (peripheral artery disease)   Atherosclerosis of native arteries of the extremities with intermittent claudication   Hyperglycemia   Type 2 diabetes mellitus   Normocytic anemia   DJD (degenerative joint disease)   Foot ulcer, left   Septic shock(785.52)   Acute respiratory failure with hypoxia   . antiseptic oral rinse  15 mL Mouth Rinse QID  . chlorhexidine  15 mL Mouth Rinse BID  . Chlorhexidine Gluconate Cloth  6 each Topical Q0600  . hydrALAZINE  100 mg Oral TID  . insulin aspart  0-20 Units Subcutaneous Q4H  . mupirocin ointment  1 application Nasal BID  . pantoprazole sodium  40 mg Per Tube QHS  . rifampin  300 mg Oral Q12H  . vancomycin  1,000 mg Intravenous Q48H    Subjective: She is feeling a little bit better today.  Objective: Temp:  [98 F (36.7 C)-98.9 F (37.2 C)] 98.3 F (36.8 C) (02/06 1155) Pulse Rate:  [42-56] 56 (02/06 1000) Resp:  [13-39] 18 (02/06 1100) BP: (96-154)/(38-91) 148/43 mmHg (02/06 1100) SpO2:  [96 %-100 %] 100 % (02/06 1100) Arterial Line BP: (117-198)/(35-73) 194/52 mmHg (02/06 1100) FiO2 (%):  [40 %-60 %] 40 % (02/06 1100) Weight:  [80.4 kg (177 lb 4 oz)] 80.4 kg (177 lb 4 oz) (02/06 0412)  General: Slightly groggy but interactive Skin: Bulky gauze dressing over her former ICD pocket in the left upper chest Lungs: Clear Cor: One over two systolic murmur Left foot: No change in dry ulcer over medial left first MTP joint. No surrounding cellulitis  Lab Results Lab Results    Component Value Date   WBC 24.4* 11/10/2013   HGB 8.1* 11/10/2013   HCT 22.6* 11/10/2013   MCV 83.1 11/10/2013   PLT 134* 11/10/2013    Lab Results  Component Value Date   CREATININE 3.27* 11/10/2013   BUN 73* 11/10/2013   NA 132* 11/10/2013   K 3.8 11/10/2013   CL 101 11/10/2013   CO2 16* 11/10/2013    Lab Results  Component Value Date   ALT <5 11/10/2013   AST 37 11/10/2013   ALKPHOS 109 11/10/2013   BILITOT 1.2 11/10/2013      Microbiology: Recent Results (from the past 240 hour(s))  URINE CULTURE     Status: None   Collection Time    11/04/13 11:50 AM      Result Value Range Status   Specimen Description URINE, CLEAN CATCH   Final   Special Requests NONE   Final   Culture  Setup Time     Final   Value: 11/04/2013 21:17     Performed at [facility]     Final   Value: 65,000 COLONIES/ML     Performed at [facility]   Culture     Final   Value: METHICILLIN RESISTANT STAPHYLOCOCCUS AUREUS     Note: RIFAMPIN AND GENTAMICIN SHOULD NOT BE USED AS SINGLE DRUGS FOR  TREATMENT OF STAPH INFECTIONS. CRITICAL RESULT CALLED TO, READ BACK BY AND VERIFIED WITH: JESSICA C@3 :05PM ON 11/06/13 BY DANTS     Performed at [facility]   Report Status 11/06/2013 FINAL   Final   Organism ID, Bacteria METHICILLIN RESISTANT STAPHYLOCOCCUS AUREUS   Final  STOOL CULTURE     Status: None   Collection Time    11/04/13  3:55 PM      Result Value Range Status   Specimen Description STOOL   Final   Special Requests NONE   Final   Culture     Final   Value: NO SALMONELLA, SHIGELLA, CAMPYLOBACTER, YERSINIA, OR E.COLI 0157:H7 ISOLATED     Performed at [facility]   Report Status 11/08/2013 FINAL   Final  CLOSTRIDIUM DIFFICILE BY PCR     Status: None   Collection Time    11/04/13  3:55 PM      Result Value Range Status   C difficile by pcr NEGATIVE  NEGATIVE Final  CULTURE, BLOOD (ROUTINE X 2)     Status: None   Collection Time    11/04/13  5:13 PM      Result Value  Range Status   Specimen Description BLOOD LEFT HAND   Final   Special Requests BOTTLES DRAWN AEROBIC AND ANAEROBIC 10CC EACH   Final   Culture  Setup Time     Final   Value: 11/05/2013 20:50     Performed at [facility]   Culture     Final   Value: METHICILLIN RESISTANT STAPHYLOCOCCUS AUREUS     Note: RIFAMPIN AND GENTAMICIN SHOULD NOT BE USED AS SINGLE DRUGS FOR TREATMENT OF STAPH INFECTIONS. CRITICAL RESULT CALLED TO, READ BACK BY AND VERIFIED WITH: JESSICA CHILDRESS@0913  ON [id number] BY [facility]     Note: Gram Stain Report Called to,Read Back By and Verified With: HAWKINS M AT 0729A ON [id number] BY THOMPSON S Performed at [facility]     Performed at [facility]   Report Status 11/07/2013 FINAL   Final   Organism ID, Bacteria METHICILLIN RESISTANT STAPHYLOCOCCUS AUREUS   Final  CULTURE, BLOOD (ROUTINE X 2)     Status: None   Collection Time    11/04/13  5:19 PM      Result Value Range Status   Specimen Description BLOOD RIGHT ARM   Final   Special Requests BOTTLES DRAWN AEROBIC AND ANAEROBIC 10CC EACH   Final   Culture  Setup Time     Final   Value: 11/05/2013 20:50     Performed at [facility]   Culture     Final   Value: STAPHYLOCOCCUS AUREUS     Note: SUSCEPTIBILITIES PERFORMED ON PREVIOUS CULTURE WITHIN THE LAST 5 DAYS.     Note: Gram Stain Report Called to,Read Back By and Verified With: HAWKINS M AT 0729A ON [id number] BY THOMPSON S Performed at [facility]     Performed at [facility]   Report Status 11/07/2013 FINAL   Final  CULTURE, BLOOD (ROUTINE X 2)     Status: None   Collection Time    11/06/13 12:28 PM      Result Value Range Status   Specimen Description BLOOD RIGHT HAND   Final   Special Requests     Final   Value: BOTTLES DRAWN AEROBIC AND ANAEROBIC 8CC EACH BOTTLE   Culture NO GROWTH 4 DAYS   Final   Report Status PENDING  Incomplete  MRSA PCR SCREENING     Status: Abnormal   Collection Time    11/06/13 12:30 PM       Result Value Range Status   MRSA by PCR POSITIVE (*) NEGATIVE Final   Comment:            The GeneXpert MRSA Assay (FDA     approved for NASAL specimens     only), is one component of a     comprehensive MRSA colonization     surveillance program. It is not     intended to diagnose MRSA     infection nor to guide or     monitor treatment for     MRSA infections.     RESULT CALLED TO, READ BACK BY AND VERIFIED WITH:     CRUISE,J. AT [id number] ON 11/06/2013 BY BAUGHAM,M.  CULTURE, BLOOD (ROUTINE X 2)     Status: None   Collection Time    11/06/13  1:30 PM      Result Value Range Status   Specimen Description BLOOD RIGHT HAND   Final   Special Requests     Final   Value: BOTTLES DRAWN AEROBIC AND ANAEROBIC AEB=6CC ANA=4CC   Culture  Setup Time     Final   Value: 11/08/2013 01:26     Performed at [facility]   Culture     Final   Value: STAPHYLOCOCCUS AUREUS     Note: SUSCEPTIBILITIES PERFORMED ON PREVIOUS CULTURE WITHIN THE LAST 5 DAYS.     Note: Performed at [facility] Gram Stain Report Called to,Read Back By and Verified With: [place] [id number] ON 11/07/13 BY Tyrone Schimke M     Performed at [facility]   Report Status 11/09/2013 FINAL   Final  CULTURE, BLOOD (ROUTINE X 2)     Status: None   Collection Time    11/08/13  8:40 PM      Result Value Range Status   Specimen Description BLOOD PICC LINE   Final   Special Requests BOTTLES DRAWN AEROBIC AND ANAEROBIC 10CC   Final   Culture  Setup Time     Final   Value: 11/09/2013 00:27     Performed at [facility]   Culture     Final   Value:        BLOOD CULTURE RECEIVED NO GROWTH TO DATE CULTURE WILL BE HELD FOR 5 DAYS BEFORE ISSUING A FINAL NEGATIVE REPORT     Performed at [facility]   Report Status PENDING   Incomplete  CULTURE, BLOOD (ROUTINE X 2)     Status: None   Collection Time    11/08/13  8:45 PM      Result Value Range Status   Specimen Description BLOOD LEFT ARM   Final   Special  Requests BOTTLES DRAWN AEROBIC AND ANAEROBIC 10CC   Final   Culture  Setup Time     Final   Value: 11/09/2013 00:26     Performed at [facility]   Culture     Final   Value:        BLOOD CULTURE RECEIVED NO GROWTH TO DATE CULTURE WILL BE HELD FOR 5 DAYS BEFORE ISSUING A FINAL NEGATIVE REPORT     Performed at [facility]   Report Status PENDING   Incomplete  URINE CULTURE     Status: None   Collection Time    11/09/13 12:06 PM  Result Value Range Status   Specimen Description URINE, CATHETERIZED   Final   Special Requests Normal   Final   Culture  Setup Time     Final   Value: 11/09/2013 13:57     Performed at [facility]     Final   Value: NO GROWTH     Performed at [facility]   Culture     Final   Value: NO GROWTH     Performed at [facility]   Report Status 11/10/2013 FINAL   Final    Studies/Results: Dg Chest Port 1 View  11/10/2013   CLINICAL DATA:  Respiratory distress, intubated  EXAM: PORTABLE CHEST - 1 VIEW  COMPARISON:  Portable exam 0709 hr compared to 11/09/2013  FINDINGS: Tip of endotracheal tube projects 2.1 cm above carinal.  Nasogastric tube extends into stomach.  Right arm PICC line tip projects over SVC above cavoatrial junction.  External pacing leads and EKG leads are superimposed with the chest.  Enlargement of cardiac silhouette with pulmonary vascular congestion.  Mild perihilar infiltrates bilaterally.  No gross pleural effusion or pneumothorax.  IMPRESSION: Enlargement of cardiac silhouette with pulmonary vascular congestion mild perihilar infiltrates question edema.   Electronically Signed   By: Lavonia Dana M.D.   On: 11/10/2013 08:03   Dg Chest Port 1 View  11/09/2013   CLINICAL DATA:  Hypoxia  EXAM: PORTABLE CHEST - 1 VIEW  COMPARISON:  November 06, 2013  FINDINGS: Endotracheal tube tip is 3.2 cm above the carina. Central catheter tip is in the superior vena cava near the junction with the right  atrium. Nasogastric tube is coiled in the stomach. Pacemaker no longer appreciable. No pneumothorax.  There is no edema or consolidation. Heart is moderately enlarged with normal pulmonary vascularity, stable. No adenopathy.  IMPRESSION: Tube and catheter positions as described without pneumothorax. Stable cardiac enlargement. No edema or consolidation.   Electronically Signed   By: Lowella Grip M.D.   On: 11/09/2013 10:21   Dg Abd Portable 1v  11/09/2013   CLINICAL DATA:  Orogastric tube placement.  EXAM: PORTABLE ABDOMEN - 1 VIEW  COMPARISON:  02/20/2011  FINDINGS: Nasogastric tube curls within the stomach, well positioned.  There is a femoral a placed right iliac vein introducer sheath.  Soft tissues are otherwise unremarkable.  Normal bowel gas pattern.  IMPRESSION: Nasogastric tube is well positioned curling within the stomach.   Electronically Signed   By: Lajean Manes M.D.   On: 11/09/2013 10:27   Dg Foot 2 Views Left  11/08/2013   CLINICAL DATA:  Chronic ulcer  EXAM: LEFT FOOT - 2 VIEW  COMPARISON:  None.  FINDINGS: There is a skin defect medial to the head of the first metatarsal. There is cortical demineralization in the adjacent head of the first metatarsal. No definite involvement of the subchondral cortex or MTP joint. There is a hallux valgus deformity. Negative for fracture. Otherwise normal mineralization and alignment. Small calcaneal spur at the plantar aponeurosis.  IMPRESSION: 1. Demineralization in the lateral aspect head first metatarsal, with an overlying soft tissue defect, suggesting osteomyelitis.   Electronically Signed   By: Arne Cleveland M.D.   On: 11/08/2013 21:46    Assessment: Ms. Ballog had MRSA bacteremia complicated by endocarditis on the tricuspid valve and defibrillator wires in the right ventricle. She appeared to embolize supportive agitation during extraction of the device yesterday and was transiently hypotensive. Now she is hypertensive. Now  the hardware  is out we'll discontinue rifampin and treat with vancomycin alone.  She may have early osteomyelitis of the first left metatarsal but I would not pursue MRI at this point. I think it is best to simply monitor her foot as we treat her for MRSA bacteremia and endocarditis.  Plan: 1. Continue vancomycin a minimum of 4 weeks 2. Discontinue rifampin 3. Please call Dr. Lita Mains [phone]) for an infectious disease questions this weekend   Michel Bickers, MD [facility] [phone] pager   [phone] cell 11/10/2013, 2:35 PM

## 2013-11-10 NOTE — Progress Notes (Signed)
Fentanyl 100 ml wasted from 250 cc concentration bag. Witnessed with Annieta Minor, RN

## 2013-11-10 NOTE — Op Note (Signed)
NAMEMORGANNE, BRAZZEL NO.:  [id number]  MEDICAL RECORD NO.:  [id number]  LOCATION:  2M15C                        FACILITY:  [facility]  PHYSICIAN:  Champ Mungo. Lovena Le, MD    DATE OF BIRTH:  [birth year]  DATE OF PROCEDURE:  11/09/2013 DATE OF DISCHARGE:                              OPERATIVE REPORT   PROCEDURE PERFORMED:  Extraction of a biventricular implantable cardioverter-defibrillator.  INTRODUCTION:  The patient is a 68 year old woman who was admitted to the hospital yesterday and transferred from an outside facility where she was found to have fevers and MRSA bacteremia.  The patient had an abnormal transthoracic echo, raising the suspicion of endocarditis, but clearly not definitive.  She is now referred for extraction of her ICD system.  DESCRIPTION OF PROCEDURE:  After informed consent was obtained, the patient was taken to the operating room in the fasting state.  The Anesthesia Service was utilized for general anesthesia under the direction of Dr. Albertina Parr.  A right femoral venous access line was placed utilizing a 7-French sheath.  After the usual preparation and draping, and appropriate time-out called, 30 mL of lidocaine was infiltrated into the left infraclavicular region.  A 5-cm incision was carried out over this region and electrocautery was utilized to dissect down to the ICD pocket.  The dense fibrous adhesions were freed up with electrocautery and the generator was removed.  The leads were also scarred down to the tissue and electrocautery was utilized to free up the fibrous adhesions around the lead in the ICD pocket. On dissecting the leads near the suture sleeve, there was a small pocket of purulent material, approx 5 cc which was around the ICD suture sleeve as an isolated abscess.  At this point, the left ventricular lead was targeted for removal first.  It should be noted that prior to the procedure, Dr. Albertina Parr and associates placed  a transesophageal echo probe which demonstrated 1.5 x 1.6 cm vegetation that appeared to be attached to the ICD lead as well as the tricuspid valve.  At this point, the 0.14 angioplasty guidewire was advanced by way of the LV lead into the lateral vein of the left ventricle, and the LV lead was removed with gentle traction over the wire.  There were no hemodynamic sequelae.  Attention was then turned to the atrial lead.  A stylet was advanced into the atrial lead and the helix of the lead retracted.  Gentle traction was then placed on the lead and the atrial lead was removed in total.  There were no hemodynamic sequelae.  Finally, the defibrillator lead was targeted for extraction. The vegetation had not moved with removal of the atrial and LV leads.  The stylet was advanced into the defibrillator lead.  The helix of the lead was retracted.  Gentle traction was then placed on the lead.  After a bit of resistance, the lead was removed in total.  After approximately two minutes, the patient's blood pressure began to drop going from 140 mmHg down to 60 mmHg.  During this time, the transesophageal echo probe demonstrated that the right ventricle became dilated.  The vegetation that was on the defibrillation lead was dislodged and went into  the pulmonary outflow tract.  There was no new pericardial effusion. The patient was supported with intravenous fluids and intravenous epinephrine with prompt improvement in her symptoms and her blood pressure increased.  At this point, the pocket was irrigated with antibiotic irrigation and the incision was closed with 2-0 Prolene suture.  A pressure dressing was placed and the patient was recovered to the MICU for additional supportive care.  COMPLICATIONS:  There were no immediate procedure complications except for the development of hypotension.  RESULTS:  This demonstrate successful extraction of a [employer] biventricular ICD in a patient with a  nonischemic cardiomyopathy and chronic systolic heart failure, who underwent initial ICD implantation 3- 1/2 years previously.     Champ Mungo. Lovena Le, MD     GWT/MEDQ  D:  11/09/2013  T:  11/10/2013  Job:  [id number]  cc:   Deboraha Sprang, MD, [facility]

## 2013-11-10 NOTE — Progress Notes (Signed)
Reported to [facility] MD that patient urine output has been scant tonight. No further orders at this time. Will continue to monitor patient.

## 2013-11-10 NOTE — Progress Notes (Signed)
Patient Name: [name]      SUBJECTIVE:intubated and nonresponsive  Past Medical History  Diagnosis Date  . Diabetes mellitus, type 2   . Essential hypertension, benign   . Chronic systolic heart failure   . Coronary atherosclerosis of native coronary artery     Nonobstructive  . Nonischemic cardiomyopathy     LVEF 20%  . PAD (peripheral artery disease)     Left foot gangrene  . Anxiety   . Depression   . ICD (implantable cardiac defibrillator) in place     Scheduled Meds:  Scheduled Meds: . antiseptic oral rinse  15 mL Mouth Rinse QID  . chlorhexidine  15 mL Mouth Rinse BID  . Chlorhexidine Gluconate Cloth  6 each Topical Q0600  . gentamicin irrigation  80 mg Irrigation On Call  . hydrocortisone sod succinate (SOLU-CORTEF) inj  50 mg Intravenous Q6H  . insulin aspart  0-20 Units Subcutaneous Q4H  . mupirocin ointment  1 application Nasal BID  . pantoprazole sodium  40 mg Per Tube QHS  . rifampin  300 mg Oral Q12H  . sodium chloride  500 mL Intravenous STAT  . vancomycin  1,000 mg Intravenous Q48H   Continuous Infusions: . sodium chloride    . feeding supplement (VITAL AF 1.2 CAL) 1,000 mL (11/10/13 0600)  . fentaNYL infusion INTRAVENOUS 75 mcg/hr (11/10/13 0755)    PHYSICAL EXAM Filed Vitals:   11/10/13 0412 11/10/13 0500 11/10/13 0600 11/10/13 0821  BP:  101/75 113/91   Pulse:  48 50   Temp:    98.1 F (36.7 C)  TempSrc:    Oral  Resp:  14 30   Height:      Weight: 177 lb 4 oz (80.4 kg)     SpO2:  100% 100%     Intubated Good air  Movement RRR Wound without hematoma No edema   Intake/Output Summary (Last 24 hours) at 11/10/13 0942 Last data filed at 11/10/13 0600  Gross per 24 hour  Intake 1388.82 ml  Output   1260 ml  Net 128.82 ml    LABS: Basic Metabolic Panel:  Recent Labs Lab 11/08/13 1303 11/08/13 2040 11/08/13 2045 11/09/13 0315 11/09/13 0620 11/09/13 1230 11/10/13 0414 11/10/13 0500  NA 123* 123* 126*  124* 125* 130*  --  132*  K 3.5* 3.8 4.0 4.2 3.7 3.8  --  3.8  CL 95* 94* 96 97 96 101  --  101  CO2 12* 13* 13* 10* 12* 13*  --  16*  GLUCOSE 163* 187* 188* 252* 265* 182*  --  130*  BUN 76* 77* 78* 76* 75* 75*  --  73*  CREATININE 3.73* 3.31* 3.36* 3.01* 2.98* 2.69*  --  3.27*  CALCIUM 7.3* 7.5* 7.7* 7.5* 7.4* 8.7  --  7.8*  MG  --   --   --   --   --   --  2.3  --   PHOS  --   --   --   --   --   --  3.5  --    Cardiac Enzymes:  Recent Labs  11/09/13 1230  TROPONINI 0.55*   CBC:  Recent Labs Lab 11/04/13 0832 11/07/13 1420 11/08/13 0433 11/08/13 0719 11/09/13 1230 11/10/13 0500  WBC 18.3* 24.4* 26.7*  --  19.8* 24.4*  NEUTROABS 15.5* 20.3* 22.2*  --  17.8*  --   HGB 9.8* 7.9* 8.0* 7.7* 8.8* 8.1*  HCT 27.8* 22.7*  22.7* 21.5* 24.0* 22.6*  MCV 84.8 82.2 83.2  --  81.6 83.1  PLT 152 86* 88*  --  101* 134*   PROTIME:  Recent Labs  11/07/13 1853 11/09/13 1230  LABPROT 15.6* 17.8*  INR 1.27 1.51*   Liver Function Tests:  Recent Labs  11/10/13 0500  AST 37  ALT <5  ALKPHOS 109  BILITOT 1.2  PROT 6.1  ALBUMIN 1.9*  Thyroid Function Tests:  Recent Labs  11/09/13 1230  TSH 0.877      ASSESSMENT AND PLAN:  Principal Problem:   MRSA bacteremia Active Problems:   HYPERLIPIDEMIA   CORONARY ATHEROSCLEROSIS NATIVE CORONARY ARTERY   Nonischemic cardiomyopathy   Chronic systolic heart failure   PAD (peripheral artery disease)   Biventricular implantable cardioverter-defibrillator in situ   Atherosclerosis of native arteries of the extremities with intermittent claudication   Hyperglycemia   Type 2 diabetes mellitus   Normocytic anemia   DJD (degenerative joint disease)   Foot ulcer, left   Septic shock(785.52)   Acute respiratory failure with hypoxia A/C renal function no better Shock improving off pressors afebrile Leukocytosis persisting Hgb falling>>follow  Overall improved Thanks for great CCM support  Signed, Virl Axe  MD  11/10/2013

## 2013-11-11 LAB — CULTURE, BLOOD (ROUTINE X 2): CULTURE: NO GROWTH

## 2013-11-11 LAB — BASIC METABOLIC PANEL WITH GFR
BUN: 75 mg/dL — ABNORMAL HIGH (ref 6–23)
CO2: 20 meq/L (ref 19–32)
Calcium: 7.5 mg/dL — ABNORMAL LOW (ref 8.4–10.5)
Chloride: 101 meq/L (ref 96–112)
Creatinine, Ser: 2.63 mg/dL — ABNORMAL HIGH (ref 0.50–1.10)
GFR calc Af Amer: 21 mL/min — ABNORMAL LOW
GFR calc non Af Amer: 18 mL/min — ABNORMAL LOW
Glucose, Bld: 135 mg/dL — ABNORMAL HIGH (ref 70–99)
Potassium: 3.6 meq/L — ABNORMAL LOW (ref 3.7–5.3)
Sodium: 136 meq/L — ABNORMAL LOW (ref 137–147)

## 2013-11-11 LAB — MAGNESIUM: Magnesium: 2.2 mg/dL (ref 1.5–2.5)

## 2013-11-11 LAB — PHOSPHORUS: PHOSPHORUS: 2.8 mg/dL (ref 2.3–4.6)

## 2013-11-11 LAB — GLUCOSE, CAPILLARY
GLUCOSE-CAPILLARY: 141 mg/dL — AB (ref 70–99)
Glucose-Capillary: 106 mg/dL — ABNORMAL HIGH (ref 70–99)
Glucose-Capillary: 127 mg/dL — ABNORMAL HIGH (ref 70–99)
Glucose-Capillary: 134 mg/dL — ABNORMAL HIGH (ref 70–99)
Glucose-Capillary: 190 mg/dL — ABNORMAL HIGH (ref 70–99)
Glucose-Capillary: 96 mg/dL (ref 70–99)

## 2013-11-11 MED ORDER — WHITE PETROLATUM GEL
Status: AC
  Administered 2013-11-11: 10:00:00
  Filled 2013-11-11: qty 5

## 2013-11-11 NOTE — Progress Notes (Signed)
[facility] ADULT ICU REPLACEMENT PROTOCOL FOR AM LAB REPLACEMENT ONLY  The patient does not apply for the [facility] Adult ICU Electrolyte Replacment Protocol based on the criteria listed below:   1. Is GFR >/= 40 ml/min? no  Patient's GFR today is 75 2. Is urine output >/= 0.5 ml/kg/hr for the last 6 hours? no Patient's UOP is  ml/kg/hr 3. Is BUN < 60 mg/dL? no  Patient's BUN today is  4. Abnormal electrolyte(s):k 3.6 5. Ordered repletion with: NA 6. If [name] panic level lab has been reported, has the CCM MD in charge been notified? no.   Physician:  Dr Nelda Marseille. He will refer to rounding MD  [facility], [name] 11/11/2013 6:03 AM

## 2013-11-11 NOTE — Progress Notes (Signed)
SUBJECTIVE: S/p CRTD system extraction 11-09-2013  Extubated and off pressors  Reports being weak this morning, moderate incisional pain.   I/O +2L yesterday  CURRENT MEDICATIONS: . antiseptic oral rinse  15 mL Mouth Rinse QID  . chlorhexidine  15 mL Mouth Rinse BID  . hydrALAZINE  100 mg Oral TID  . insulin aspart  0-20 Units Subcutaneous Q4H  . insulin glargine  15 Units Subcutaneous Daily  . mupirocin ointment  1 application Nasal BID  . pantoprazole  40 mg Oral QHS  . vancomycin  1,000 mg Intravenous Q48H   . sodium chloride    . fentaNYL infusion INTRAVENOUS Stopped (11/10/13 1116)  . niCARDipine Stopped (11/11/13 0100)  .  sodium bicarbonate  infusion 1000 mL 50 mL/hr at 11/10/13 1158    OBJECTIVE: Physical Exam: Filed Vitals:   11/11/13 0343 11/11/13 0400 11/11/13 0500 11/11/13 0600  BP:  157/80 159/39 158/38  Pulse:  61 59 60  Temp: 100.3 F (37.9 C)     TempSrc: Oral     Resp:  19 29 32  Height:      Weight:   177 lb 4 oz (80.4 kg)   SpO2:  99% 91% 91%    Intake/Output Summary (Last 24 hours) at 11/11/13 0732 Last data filed at 11/11/13 0600  Gross per 24 hour  Intake 2667.72 ml  Output    570 ml  Net 2097.72 ml    Telemetry reveals sinus rhythm  GEN- The patient is weak appearing, but no respiratory distress.   Head- normocephalic, atraumatic Neck- supple, no JVP Lungs- scattered rales bilaterally, normal work of breathing, ICD incision clean and dry Heart- Regular rate and rhythm, no murmurs, rubs or gallops, PMI not laterally displaced GI- soft, NT, ND, + BS Extremities- no clubbing, cyanosis, or edema Skin- no rash or lesion Psych- euthymic mood, full affect Neuro- strength and sensation are intact  LABS: Basic Metabolic Panel:  Recent Labs  11/10/13 0500 11/10/13 1500 11/11/13 0423  NA 132*  --  136*  K 3.8  --  3.6*  CL 101  --  101  CO2 16*  --  20  GLUCOSE 130*  --  135*  BUN 73*  --  75*  CREATININE 3.27*  --  2.63*    CALCIUM 7.8*  --  7.5*  MG  --  2.1 2.2  PHOS  --  3.6 2.8   Liver Function Tests:  Recent Labs  11/10/13 0500  AST 37  ALT <5  ALKPHOS 109  BILITOT 1.2  PROT 6.1  ALBUMIN 1.9*   CBC:  Recent Labs  11/09/13 1230 11/10/13 0500  WBC 19.8* 24.4*  NEUTROABS 17.8*  --   HGB 8.8* 8.1*  HCT 24.0* 22.6*  MCV 81.6 83.1  PLT 101* 134*   Cardiac Enzymes:  Recent Labs  11/09/13 1230  TROPONINI 0.55*   Thyroid Function Tests:  Recent Labs  11/09/13 1230  TSH 0.877    RADIOLOGY: Dg Chest 2 View 11/04/2013   CLINICAL DATA:  Weakness  EXAM: CHEST - 2 VIEW  COMPARISON:  05/09/2013  FINDINGS: Stable cardiomegaly. Lungs clear. Stable left subclavian AICD. Orthopedic anchor in the right humeral head. . No effusion.  IMPRESSION: Stable cardiomegaly and postop change.  No acute disease.   Electronically Signed   By: Arne Cleveland M.D.   On: 11/04/2013 09:43   ASSESSMENT AND PLAN:  1. MRSA endocarditis 2. 1.5 X 1.6 vegetation, subsequently embolized to lung 3. ICD system  extraction 4. Acute on chronic renal insufficiency 5. Non-ischemic CM, with EF improvement following BiV ICD implant 6. Acute respiratory failure secondary to #1 7. MRSA pneumonia Rec: She is now extubated, fever trending down and off pressors. Overall improving. She will need long term anti-biotic therapy. We could take the patient back on our service but would also be reasonable to transfer to hospital service. Assuming she continues to recover, she will need long term anti-biotics, PICC placement (?replacement) and possible SNF.  Mikle Bosworth.D.

## 2013-11-11 NOTE — Progress Notes (Signed)
Patient ID: [name], female   DOB: 12-Oct-1945, 68 y.o.   MRN: [id number]    Patient: [name] Date of Encounter: 11/11/2013, 11:39 AM Admit date: 11/04/2013     Subjective  No complaints     Objective  Physical Exam: Vitals: BP 176/42  Pulse 57  Temp(Src) 98 F (36.7 C) (Oral)  Resp 24  Ht 5\' 3"  (1.6 m)  Wt 177 lb 4 oz (80.4 kg)  BMI 31.41 kg/m2  SpO2 100% General: Well developed 68 year old female in no acute distress. Neck: Supple. JVD not elevated. Lungs: Clear bilaterally to auscultation without wheezes, rales, or rhonchi. Breathing is unlabored. Heart: RRR S1 S2  Mild AS , rubs, or gallops.  Abdomen: Soft, non-distended. Extremities: No clubbing or cyanosis. No edema.   Neuro: Alert and oriented X 3. Moves all extremities spontaneously. No focal deficits. AICD Explant sight under left clavicle with mild induration Sutures still in   Intake/Output:  Intake/Output Summary (Last 24 hours) at 11/11/13 1139 Last data filed at 11/11/13 1100  Gross per 24 hour  Intake 1864.17 ml  Output    820 ml  Net 1044.17 ml    Inpatient Medications:  . antiseptic oral rinse  15 mL Mouth Rinse QID  . chlorhexidine  15 mL Mouth Rinse BID  . hydrALAZINE  100 mg Oral TID  . insulin aspart  0-20 Units Subcutaneous Q4H  . insulin glargine  15 Units Subcutaneous Daily  . mupirocin ointment  1 application Nasal BID  . pantoprazole  40 mg Oral QHS  . vancomycin  1,000 mg Intravenous Q48H   . sodium chloride    . fentaNYL infusion INTRAVENOUS Stopped (11/10/13 1116)  . niCARDipine Stopped (11/11/13 0100)  .  sodium bicarbonate  infusion 1000 mL 50 mL/hr at 11/11/13 0817    Labs:  Recent Labs  11/10/13 0500 11/10/13 1500 11/11/13 0423  NA 132*  --  136*  K 3.8  --  3.6*  CL 101  --  101  CO2 16*  --  20  GLUCOSE 130*  --  135*  BUN 73*  --  75*  CREATININE 3.27*  --  2.63*  CALCIUM 7.8*  --  7.5*  MG  --  2.1 2.2  PHOS  --  3.6 2.8    Recent Labs  11/10/13 0500  AST 37  ALT <5  ALKPHOS 109  BILITOT 1.2  PROT 6.1  ALBUMIN 1.9*    Recent Labs  11/09/13 1230 11/10/13 0500  WBC 19.8* 24.4*  NEUTROABS 17.8*  --   HGB 8.8* 8.1*  HCT 24.0* 22.6*  MCV 81.6 83.1  PLT 101* 134*   No results found for this basename: HGBA1C,  in the last 72 hours  Recent Labs  11/09/13 1230  INR 1.51*    Radiology/Studies: Dg Chest Port 1 View 11/06/2013     FINDINGS: Cardiac shadow is stable. A defibrillator is again seen. A new right-sided PICC line is noted with the catheter tip in the mid right atrium. The lungs are clear. No acute bony abnormality is noted.   IMPRESSION: New right PICC line with the tip in the mid right atrium.    Electronically Signed   By: Inez Catalina M.D.   On: 11/06/2013 15:02   Telemetry: AV paced; brief NSVT   Assessment and Plan  MRSA bacteremia with explant AICD  F/U echo with no signs of SBE Multiple other medical issues including anemia and CRF.  Continue  Vanc Cardiac status stable   Signed, Jenkins Rouge PA-C

## 2013-11-11 NOTE — Progress Notes (Signed)
Name: [name] MRN: [id number] DOB: November 20, 1945    ADMISSION DATE:  11/04/2013 CONSULTATION DATE:  11/09/2013  REFERRING MD :  [facility] PRIMARY SERVICE: PCCM  CHIEF COMPLAINT:  shock  BRIEF PATIENT DESCRIPTION: Patient is a 68 yo female with history of DM 2, HTN, CHF (EF 20%), CAD, PAD, ICD, Depression who presented to the hospital for hyperglycemia and failure to thrive, also with fever. During initial hospitalization she had blood cultures drawn that revealed MRSA. She has been treated with cefazolin and rifampin. She had her ICD removed today for concern for septic source and developed hypotension following the procedure.   SIGNIFICANT EVENTS / STUDIES:  2/1 admission to [facility] for hyperglycemia, on insulin drip  2/1 BCx with MRSA, started on vanc  2/2 ID c/s and rifampin and cefazolin added  2/3 TTE EF 45% global hypokinesis, moderate LVH, grade 1 diastolic dysfunction, shadowing at level of tricuspid valve (vegetation cannot be completely excluded).  2/4 transferred to [facility] from [facility]  2/5- removal wires, shock, ETT 2/5 pressors off  LINES / TUBES: 2/2 PICC >>>  2/5 ETT >>> 2/6 2/5 rt cordis>>>  CULTURES: 1/31 Stool Cx >>> no growth  1/31 BCx x2 >>> MRSA  1/31 UCx >>> MRSA  1/31 C diff >>> neg  2/2 Nasal PCR >>> MRSA  2/2 BCx >>> staph aureus, awaiting susceptibilities  2/2 BCx >>> negative 2/4 BCx x2 >>>  2/5 UCx >>> negative  ANTIBIOTICS: 2/2 Vanc >>>  2/2 Rifampin >>> 2/6 2/2 cefazolin >>> 2/3  SUBJECTIVE:  Extubated and improved Sleepy   VITAL SIGNS: Temp:  [98 F (36.7 C)-100.3 F (37.9 C)] 99.4 F (37.4 C) (02/07 1143) Pulse Rate:  [56-83] 57 (02/07 1110) Resp:  [16-32] 24 (02/07 1110) BP: (132-176)/(36-80) 176/42 mmHg (02/07 1110) SpO2:  [90 %-100 %] 100 % (02/07 1110) Arterial Line BP: (183-231)/(38-99) 231/49 mmHg (02/06 1400) Weight:  [80.4 kg (177 lb 4 oz)] 80.4 kg (177 lb 4 oz) (02/07 0500) HEMODYNAMICS:   VENTILATOR SETTINGS:    INTAKE / OUTPUT: Intake/Output     02/06 0701 - 02/07 0700 02/07 0701 - 02/08 0700   I.V. (mL/kg) 2093.1 (26) 240 (3)   NG/GT 234.7    IV Piggyback 400    Total Intake(mL/kg) 2727.7 (33.9) 240 (3)   Urine (mL/kg/hr) 570 (0.3) 250 (0.6)   Blood     Total Output 570 250   Net +2157.7 -10          PHYSICAL EXAMINATION: General: comfortable appearing  Neuro: awake and interacting HEENT: OP clear, moderate cough Cardiovascular: rrr, no murmur appreciated  Lungs: clear breath sounds, no wheezing  Abdomen: Soft, ND, hypoactive BS  Musculoskeletal: No gross deformities  Skin: Left 1st MTP with ulceration ~1 cm diameter,   LABS:  CBC  Recent Labs Lab 11/08/13 0433 11/08/13 0719 11/09/13 1230 11/10/13 0500  WBC 26.7*  --  19.8* 24.4*  HGB 8.0* 7.7* 8.8* 8.1*  HCT 22.7* 21.5* 24.0* 22.6*  PLT 88*  --  101* 134*   Coag's  Recent Labs Lab 11/07/13 1853 11/09/13 1230  APTT  --  37  INR 1.27 1.51*   BMET  Recent Labs Lab 11/09/13 1230 11/10/13 0500 11/11/13 0423  NA 130* 132* 136*  K 3.8 3.8 3.6*  CL 101 101 101  CO2 13* 16* 20  BUN 75* 73* 75*  CREATININE 2.69* 3.27* 2.63*  GLUCOSE 182* 130* 135*   Electrolytes  Recent Labs Lab 11/09/13 1230 11/10/13 0414 11/10/13  0500 11/10/13 1500 11/11/13 0423  CALCIUM 8.7  --  7.8*  --  7.5*  MG  --  2.3  --  2.1 2.2  PHOS  --  3.5  --  3.6 2.8   Sepsis Markers  Recent Labs Lab 11/09/13 1230  LATICACIDVEN 1.0   ABG  Recent Labs Lab 11/10/13 0725 11/10/13 1059 11/10/13 1152  PHART 7.297* 7.325* 7.369  PCO2ART 34.5* 33.5* 28.9*  PO2ART 111.0* 125.0* 79.0*   Liver Enzymes  Recent Labs Lab 11/07/13 0430 11/10/13 0500  AST 23 37  ALT 10 <5  ALKPHOS 107 109  BILITOT 1.0 1.2  ALBUMIN 1.9* 1.9*   Cardiac Enzymes  Recent Labs Lab 11/09/13 1230  TROPONINI 0.55*   Glucose  Recent Labs Lab 11/10/13 1553 11/10/13 1911 11/11/13 0019 11/11/13 0342 11/11/13 0740 11/11/13 1128  GLUCAP  249* 203* 190* 141* 106* 134*    Imaging Dg Chest Port 1 View  11/10/2013   CLINICAL DATA:  Respiratory distress, intubated  EXAM: PORTABLE CHEST - 1 VIEW  COMPARISON:  Portable exam 0709 hr compared to 11/09/2013  FINDINGS: Tip of endotracheal tube projects 2.1 cm above carinal.  Nasogastric tube extends into stomach.  Right arm PICC line tip projects over SVC above cavoatrial junction.  External pacing leads and EKG leads are superimposed with the chest.  Enlargement of cardiac silhouette with pulmonary vascular congestion.  Mild perihilar infiltrates bilaterally.  No gross pleural effusion or pneumothorax.  IMPRESSION: Enlargement of cardiac silhouette with pulmonary vascular congestion mild perihilar infiltrates question edema.   Electronically Signed   By: Lavonia Dana M.D.   On: 11/10/2013 08:03     ASSESSMENT / PLAN:  PULMONARY  A: acute respiratory failure likely related to septic emboli, uncompensated metabolic acidosis, resolved P:  - push pulm hygiene  CARDIOVASCULAR  A: septic Shock / SIRS post removal wires - improved shock state pulmonary septic emboli  S/p ICD removal  SVC thrombus  CHF EF 45%  H/o HTN and worsening Troponin 0.55, likely related to hypotensive episode / demand  Relative AI - cortisol 16.8 P:  Cards following  Avoid neck lines with SVC clot  Add home hydralazine to prn IV hydralazine  RENAL  A: Acute on chronic kidney disease, baseline Cr 1.3 - worsened post hypotensive event -ATN Hyponatremia improving  Anion/non-anion gap metabolic acidosis  P:  1/2 NS to avoid worsening non-anion gap  D/c bicarb on 2/7 Follow BMP  GASTROINTESTINAL  A: tolerating TF P:  PPI GI protection   HEMATOLOGIC  A: Anemic Leukocytosis in setting of MRSA infection  P:  Monitor CBC  SCDs VTE prophylaxis   INFECTIOUS  A: MRSA bacteremia  Septic endocarditis  Left first MTP ulceration foot XR with concern for osteomylitis  P:  Continue vanco Rifampin d/c'd  2/6 ID following  F/u repeat BCx  ID recommends hold off MRI of foot to further evaluate osteo at this time  ENDOCRINE  A: DM2  P:   Monitor cbg SSI  Continue lantus 15 u daily   NEUROLOGIC  A: encephalopathy, improved P:  Fentanyl for pain control    TODAY'S SUMMARY:   I have personally obtained a history, examined the patient, evaluated laboratory and imaging results, formulated the assessment and plan and placed orders.  Baltazar Apo, MD, PhD 11/11/2013, 12:14 PM [facility] [phone] or if no answer [phone]

## 2013-11-12 DIAGNOSIS — N179 Acute kidney failure, unspecified: Secondary | ICD-10-CM

## 2013-11-12 DIAGNOSIS — N189 Chronic kidney disease, unspecified: Secondary | ICD-10-CM

## 2013-11-12 LAB — BASIC METABOLIC PANEL
BUN: 67 mg/dL — AB (ref 6–23)
CHLORIDE: 99 meq/L (ref 96–112)
CO2: 21 mEq/L (ref 19–32)
Calcium: 7.6 mg/dL — ABNORMAL LOW (ref 8.4–10.5)
Creatinine, Ser: 1.97 mg/dL — ABNORMAL HIGH (ref 0.50–1.10)
GFR calc Af Amer: 29 mL/min — ABNORMAL LOW (ref >90)
GFR calc non Af Amer: 25 mL/min — ABNORMAL LOW (ref >90)
GLUCOSE: 138 mg/dL — AB (ref 70–99)
POTASSIUM: 3.5 meq/L — AB (ref 3.7–5.3)
Sodium: 135 mEq/L — ABNORMAL LOW (ref 137–147)

## 2013-11-12 LAB — TYPE AND SCREEN
ABO/RH(D): B POS
ABO/RH(D): B POS
Antibody Screen: NEGATIVE
Antibody Screen: NEGATIVE
UNIT DIVISION: 0
UNIT DIVISION: 0
Unit division: 0
Unit division: 0
Unit division: 0
Unit division: 0
Unit division: 0

## 2013-11-12 LAB — CBC
HEMATOCRIT: 21 % — AB (ref 36.0–46.0)
Hemoglobin: 7.4 g/dL — ABNORMAL LOW (ref 12.0–15.0)
MCH: 29.4 pg (ref 26.0–34.0)
MCHC: 35.2 g/dL (ref 30.0–36.0)
MCV: 83.3 fL (ref 78.0–100.0)
Platelets: 201 10*3/uL (ref 150–400)
RBC: 2.52 MIL/uL — AB (ref 3.87–5.11)
RDW: 15 % (ref 11.5–15.5)
WBC: 20.7 10*3/uL — AB (ref 4.0–10.5)

## 2013-11-12 LAB — GLUCOSE, CAPILLARY
GLUCOSE-CAPILLARY: 113 mg/dL — AB (ref 70–99)
GLUCOSE-CAPILLARY: 124 mg/dL — AB (ref 70–99)
GLUCOSE-CAPILLARY: 156 mg/dL — AB (ref 70–99)
Glucose-Capillary: 216 mg/dL — ABNORMAL HIGH (ref 70–99)
Glucose-Capillary: 167 mg/dL — ABNORMAL HIGH (ref 70–99)
Glucose-Capillary: 179 mg/dL — ABNORMAL HIGH (ref 70–99)

## 2013-11-12 LAB — MAGNESIUM: Magnesium: 2.3 mg/dL (ref 1.5–2.5)

## 2013-11-12 LAB — PHOSPHORUS: Phosphorus: 4 mg/dL (ref 2.3–4.6)

## 2013-11-12 MED ORDER — AMLODIPINE BESYLATE 10 MG PO TABS
10.0000 mg | ORAL_TABLET | Freq: Every day | ORAL | Status: DC
Start: 2013-11-12 — End: 2013-11-20
  Administered 2013-11-12 – 2013-11-20 (×9): 10 mg via ORAL
  Filled 2013-11-12 (×9): qty 1

## 2013-11-12 MED ORDER — WHITE PETROLATUM GEL
Status: AC
  Administered 2013-11-12: 11:00:00
  Filled 2013-11-12: qty 5

## 2013-11-12 NOTE — Progress Notes (Signed)
Name: [name] MRN: [id number] DOB: 10-Mar-1946    ADMISSION DATE:  11/04/2013 CONSULTATION DATE:  11/09/2013  REFERRING MD :  [facility] PRIMARY SERVICE: PCCM  CHIEF COMPLAINT:  shock  BRIEF PATIENT DESCRIPTION: Patient is a 68 yo female with history of DM 2, HTN, CHF (EF 20%), CAD, PAD, ICD, Depression who presented to the hospital for hyperglycemia and failure to thrive, also with fever. During initial hospitalization she had blood cultures drawn that revealed MRSA. She has been treated with cefazolin and rifampin. She had her ICD removed today for concern for septic source and developed hypotension following the procedure.   SIGNIFICANT EVENTS / STUDIES:  2/1 admission to [facility] for hyperglycemia, on insulin drip  2/1 BCx with MRSA, started on vanc  2/2 ID c/s and rifampin and cefazolin added  2/3 TTE EF 45% global hypokinesis, moderate LVH, grade 1 diastolic dysfunction, shadowing at level of tricuspid valve (vegetation cannot be completely excluded).  2/4 transferred to [facility] from [facility]  2/5- removal wires, shock, ETT 2/5 pressors off  LINES / TUBES: 2/2 PICC >>>  2/5 ETT >>> 2/6 2/5 rt cordis>>>  CULTURES: 1/31 Stool Cx >>> no growth  1/31 BCx x2 >>> MRSA  1/31 UCx >>> MRSA  1/31 C diff >>> neg  2/2 Nasal PCR >>> MRSA  2/2 BCx >>> staph aureus, awaiting susceptibilities  2/2 BCx >>> negative 2/4 BCx x2 >>>  2/5 UCx >>> negative  ANTIBIOTICS: 2/2 Vanc >>>  2/2 Rifampin >>> 2/6 2/2 cefazolin >>> 2/3  SUBJECTIVE:  More awake today C/o fecal incontinence, doesn't know that she is going  VITAL SIGNS: Temp:  [98.7 F (37.1 C)-99.3 F (37.4 C)] 98.7 F (37.1 C) (02/08 1131) Pulse Rate:  [55-66] 65 (02/08 1200) Resp:  [15-26] 23 (02/08 1200) BP: (142-183)/(41-72) 182/57 mmHg (02/08 1200) SpO2:  [89 %-100 %] 97 % (02/08 1200) Weight:  [79.6 kg (175 lb 7.8 oz)] 79.6 kg (175 lb 7.8 oz) (02/08 0500) HEMODYNAMICS:   VENTILATOR SETTINGS:   INTAKE /  OUTPUT: Intake/Output     02/07 0701 - 02/08 0700 02/08 0701 - 02/09 0700   I.V. (mL/kg) 670 (8.4) 100 (1.3)   NG/GT     IV Piggyback  200   Total Intake(mL/kg) 670 (8.4) 300 (3.8)   Urine (mL/kg/hr) 1140 (0.6) 425 (0.9)   Total Output 1140 425   Net -470 -125        Stool Occurrence 2 x      PHYSICAL EXAMINATION: General: comfortable appearing  Neuro: awake and interacting HEENT: OP clear, moderate cough Cardiovascular: rrr, no murmur appreciated  Lungs: clear breath sounds, no wheezing  Abdomen: Soft, ND, hypoactive BS  Musculoskeletal: No gross deformities  Skin: Left 1st MTP with ulceration ~1 cm diameter,   LABS:  CBC  Recent Labs Lab 11/09/13 1230 11/10/13 0500 11/12/13 0400  WBC 19.8* 24.4* 20.7*  HGB 8.8* 8.1* 7.4*  HCT 24.0* 22.6* 21.0*  PLT 101* 134* 201   Coag's  Recent Labs Lab 11/07/13 1853 11/09/13 1230  APTT  --  37  INR 1.27 1.51*   BMET  Recent Labs Lab 11/10/13 0500 11/11/13 0423 11/12/13 0400  NA 132* 136* 135*  K 3.8 3.6* 3.5*  CL 101 101 99  CO2 16* 20 21  BUN 73* 75* 67*  CREATININE 3.27* 2.63* 1.97*  GLUCOSE 130* 135* 138*   Electrolytes  Recent Labs Lab 11/10/13 0500 11/10/13 1500 11/11/13 0423 11/12/13 0400  CALCIUM 7.8*  --  7.5* 7.6*  MG  --  2.1 2.2 2.3  PHOS  --  3.6 2.8 4.0   Sepsis Markers  Recent Labs Lab 11/09/13 1230  LATICACIDVEN 1.0   ABG  Recent Labs Lab 11/10/13 0725 11/10/13 1059 11/10/13 1152  PHART 7.297* 7.325* 7.369  PCO2ART 34.5* 33.5* 28.9*  PO2ART 111.0* 125.0* 79.0*   Liver Enzymes  Recent Labs Lab 11/07/13 0430 11/10/13 0500  AST 23 37  ALT 10 <5  ALKPHOS 107 109  BILITOT 1.0 1.2  ALBUMIN 1.9* 1.9*   Cardiac Enzymes  Recent Labs Lab 11/09/13 1230  TROPONINI 0.55*   Glucose  Recent Labs Lab 11/11/13 1536 11/11/13 1945 11/12/13 0016 11/12/13 0408 11/12/13 0719 11/12/13 1124  GLUCAP 127* 96 156* 124* 113* 216*    Imaging No results  found.   ASSESSMENT / PLAN:  PULMONARY  A: acute respiratory failure likely related to septic emboli, uncompensated metabolic acidosis, resolved P:  - push pulm hygiene  CARDIOVASCULAR  A: septic Shock / SIRS post removal wires - improved shock state pulmonary septic emboli  S/p ICD removal  SVC thrombus  CHF EF 45%  H/o HTN and worsening Troponin 0.55, likely related to hypotensive episode / demand  Relative AI - cortisol 16.8 P:  Cards following  Avoid neck lines with SVC clot  home hydralazine + prn IV hydralazine ? Whether we can d/c cordis  RENAL  A: Acute on chronic kidney disease, baseline Cr 1.3 - worsened post hypotensive event -ATN > resolving Hyponatremia improving  Anion/non-anion gap metabolic acidosis  P:  1/2 NS to avoid worsening non-anion gap  D/c'd bicarb on 2/7 Follow BMP  GASTROINTESTINAL  A: tolerating TF P:  PPI GI protection   HEMATOLOGIC  A: Anemic Leukocytosis in setting of MRSA infection  P:  Monitor CBC  SCDs VTE prophylaxis   INFECTIOUS  A: MRSA bacteremia  Septic endocarditis  Left first MTP ulceration foot XR with concern for osteomylitis  P:  Continue vanco Rifampin d/c'd 2/6 by ID ID following  F/u repeat BCx  ID recommends hold off MRI of foot to further evaluate osteo at this time  ENDOCRINE  A: DM2  P:   Monitor cbg SSI  Continue lantus 15 u daily   NEUROLOGIC  A: encephalopathy, improved Incontinence of stool > pt concerned she can not feel  P:  Fentanyl for pain control  Will watch bowel pattern, consider further neuro w/u if persists   I have personally obtained a history, examined the patient, evaluated laboratory and imaging results, formulated the assessment and plan and placed orders.  Will transfer to SDU bed, stay on PCCM service for now  Baltazar Apo, MD, PhD 11/12/2013, 1:09 PM [place] [facility] [facility] [phone] or if no answer [phone]

## 2013-11-12 NOTE — Progress Notes (Signed)
Patient Name: [name] Date of Encounter: 11/12/2013  Principal Problem:   MRSA bacteremia Active Problems:   HYPERLIPIDEMIA   CORONARY ATHEROSCLEROSIS NATIVE CORONARY ARTERY   Nonischemic cardiomyopathy   Chronic systolic heart failure   PAD (peripheral artery disease)   Biventricular implantable cardioverter-defibrillator in situ   Atherosclerosis of native arteries of the extremities with intermittent claudication   Hyperglycemia   Type 2 diabetes mellitus   Normocytic anemia   DJD (degenerative joint disease)   Foot ulcer, left   Septic shock(785.52)   Acute respiratory failure with hypoxia   Length of Stay: 8  SUBJECTIVE  Several signs of improvement - off pressors and now BP high, afebrile, WBC lower, marked improvement in renal function Moderately anemic and drifting lower Left upper extremity is edematous  CURRENT MEDS . antiseptic oral rinse  15 mL Mouth Rinse QID  . chlorhexidine  15 mL Mouth Rinse BID  . hydrALAZINE  100 mg Oral TID  . insulin aspart  0-20 Units Subcutaneous Q4H  . insulin glargine  15 Units Subcutaneous Daily  . pantoprazole  40 mg Oral QHS  . vancomycin  1,000 mg Intravenous Q48H    OBJECTIVE   Intake/Output Summary (Last 24 hours) at 11/12/13 1226 Last data filed at 11/12/13 1200  Gross per 24 hour  Intake    670 ml  Output   1250 ml  Net   -580 ml   Filed Weights   11/10/13 0412 11/11/13 0500 11/12/13 0500  Weight: 80.4 kg (177 lb 4 oz) 80.4 kg (177 lb 4 oz) 79.6 kg (175 lb 7.8 oz)    PHYSICAL EXAM Filed Vitals:   11/12/13 1000 11/12/13 1100 11/12/13 1131 11/12/13 1200  BP: 175/44 183/66  182/57  Pulse: 66 66  65  Temp:   98.7 F (37.1 C)   TempSrc:   Oral   Resp: 19 24  23   Height:      Weight:      SpO2: 98% 97%  97%   General: Alert, oriented x3, no distress Head: no evidence of trauma, PERRL, EOMI, no exophtalmos or lid lag, no myxedema, no xanthelasma; normal ears, nose and oropharynx Neck: normal  jugular venous pulsations and no hepatojugular reflux; brisk carotid pulses without delay and no carotid bruits Chest: clear to auscultation, no signs of consolidation by percussion or palpation, normal fremitus, symmetrical and full respiratory excursions. No drainage or redness at explant site Cardiovascular: normal position and quality of the apical impulse, regular rhythm, normal first and second heart sounds, no rubs or gallops, 2/6 holosystolic murmur Abdomen: no tenderness or distention, no masses by palpation, no abnormal pulsatility or arterial bruits, normal bowel sounds, no hepatosplenomegaly Extremities: edematous left upper extremity; 2+ radial, ulnar and brachial pulses bilaterally; 2+ right femoral, posterior tibial and dorsalis pedis pulses; 2+ left femoral, posterior tibial and dorsalis pedis pulses; no subclavian or femoral bruits Neurological: grossly nonfocal  LABS  CBC  Recent Labs  11/09/13 1230 11/10/13 0500 11/12/13 0400  WBC 19.8* 24.4* 20.7*  NEUTROABS 17.8*  --   --   HGB 8.8* 8.1* 7.4*  HCT 24.0* 22.6* 21.0*  MCV 81.6 83.1 83.3  PLT 101* 134* [id number]   Basic Metabolic Panel  Recent Labs  11/11/13 0423 11/12/13 0400  NA 136* 135*  K 3.6* 3.5*  CL 101 99  CO2 20 21  GLUCOSE 135* 138*  BUN 75* 67*  CREATININE 2.63* 1.97*  CALCIUM 7.5* 7.6*  MG 2.2 2.3  PHOS 2.8  4.0   Liver Function Tests  Recent Labs  11/10/13 0500  AST 37  ALT <5  ALKPHOS 109  BILITOT 1.2  PROT 6.1  ALBUMIN 1.9*   No results found for this basename: LIPASE, AMYLASE,  in the last 72 hours Cardiac Enzymes  Recent Labs  11/09/13 1230  TROPONINI 0.55*   Recent Labs  11/09/13 1230  TSH 0.877    Radiology Studies Imaging results have been reviewed and No results found.  TELE No arrhythmia   ASSESSMENT AND PLAN Improving from multiple points of view Major concern over next few weeks will be CHF decompensation due to loss of CRT. Gradually reintroduce  antihypertensive/HF meds. Will hold off carvedilol and losartan for now due to volatility of renal and cardiac status. Restart amlodipine. Watch for signs and symptoms of CHF. Keep left arm elevated at all times.   Sanda Klein, MD, [facility] [phone] office [phone] pager 11/12/2013 12:26 PM

## 2013-11-13 DIAGNOSIS — E119 Type 2 diabetes mellitus without complications: Secondary | ICD-10-CM

## 2013-11-13 LAB — CBC
HEMATOCRIT: 22.5 % — AB (ref 36.0–46.0)
HEMOGLOBIN: 7.7 g/dL — AB (ref 12.0–15.0)
MCH: 28.7 pg (ref 26.0–34.0)
MCHC: 34.2 g/dL (ref 30.0–36.0)
MCV: 84 fL (ref 78.0–100.0)
PLATELETS: 243 10*3/uL (ref 150–400)
RBC: 2.68 MIL/uL — AB (ref 3.87–5.11)
RDW: 14.9 % (ref 11.5–15.5)
WBC: 23.4 10*3/uL — ABNORMAL HIGH (ref 4.0–10.5)

## 2013-11-13 LAB — GLUCOSE, CAPILLARY
GLUCOSE-CAPILLARY: 111 mg/dL — AB (ref 70–99)
GLUCOSE-CAPILLARY: 149 mg/dL — AB (ref 70–99)
Glucose-Capillary: 166 mg/dL — ABNORMAL HIGH (ref 70–99)
Glucose-Capillary: 194 mg/dL — ABNORMAL HIGH (ref 70–99)
Glucose-Capillary: 73 mg/dL (ref 70–99)
Glucose-Capillary: 81 mg/dL (ref 70–99)
Glucose-Capillary: 96 mg/dL (ref 70–99)

## 2013-11-13 LAB — BASIC METABOLIC PANEL
BUN: 57 mg/dL — ABNORMAL HIGH (ref 6–23)
CHLORIDE: 96 meq/L (ref 96–112)
CO2: 20 mEq/L (ref 19–32)
Calcium: 7.7 mg/dL — ABNORMAL LOW (ref 8.4–10.5)
Creatinine, Ser: 1.69 mg/dL — ABNORMAL HIGH (ref 0.50–1.10)
GFR calc Af Amer: 35 mL/min — ABNORMAL LOW (ref >90)
GFR, EST NON AFRICAN AMERICAN: 30 mL/min — AB (ref >90)
GLUCOSE: 80 mg/dL (ref 70–99)
POTASSIUM: 3.9 meq/L (ref 3.7–5.3)
SODIUM: 132 meq/L — AB (ref 137–147)

## 2013-11-13 LAB — MAGNESIUM: Magnesium: 2.3 mg/dL (ref 1.5–2.5)

## 2013-11-13 LAB — PHOSPHORUS: Phosphorus: 3.7 mg/dL (ref 2.3–4.6)

## 2013-11-13 MED ORDER — INSULIN ASPART 100 UNIT/ML ~~LOC~~ SOLN: 0.0000 [IU] | Freq: Every day | SUBCUTANEOUS | Status: DC

## 2013-11-13 MED ORDER — VANCOMYCIN HCL IN DEXTROSE 1-5 GM/200ML-% IV SOLN
1000.0000 mg | INTRAVENOUS | Status: DC
  Administered 2013-11-13 – 2013-11-16 (×4): 1000 mg via INTRAVENOUS
  Filled 2013-11-13 (×4): qty 200

## 2013-11-13 MED ORDER — HYDRALAZINE HCL 50 MG PO TABS
100.0000 mg | ORAL_TABLET | Freq: Four times a day (QID) | ORAL | Status: DC
  Administered 2013-11-13 – 2013-11-20 (×29): 100 mg via ORAL
  Filled 2013-11-13 (×31): qty 2

## 2013-11-13 MED ORDER — INSULIN ASPART 100 UNIT/ML ~~LOC~~ SOLN
0.0000 [IU] | Freq: Three times a day (TID) | SUBCUTANEOUS | Status: DC
  Administered 2013-11-13: 2 [IU] via SUBCUTANEOUS
  Administered 2013-11-14: 3 [IU] via SUBCUTANEOUS
  Administered 2013-11-14: 5 [IU] via SUBCUTANEOUS
  Administered 2013-11-14 – 2013-11-15 (×2): 2 [IU] via SUBCUTANEOUS
  Administered 2013-11-15 – 2013-11-16 (×2): 3 [IU] via SUBCUTANEOUS
  Administered 2013-11-16 – 2013-11-17 (×3): 2 [IU] via SUBCUTANEOUS
  Administered 2013-11-18 – 2013-11-19 (×4): 3 [IU] via SUBCUTANEOUS
  Administered 2013-11-20 (×2): 2 [IU] via SUBCUTANEOUS

## 2013-11-13 NOTE — Progress Notes (Signed)
Patient ID: [name], female   DOB: 04-29-1946, 68 y.o.   MRN: [id number]         [facility]    Date of Admission:  11/04/2013           Day 9  Principal Problem:   MRSA bacteremia Active Problems:   Biventricular implantable cardioverter-defibrillator in situ   HYPERLIPIDEMIA   CORONARY ATHEROSCLEROSIS NATIVE CORONARY ARTERY   Nonischemic cardiomyopathy   Chronic systolic heart failure   PAD (peripheral artery disease)   Atherosclerosis of native arteries of the extremities with intermittent claudication   Hyperglycemia   Type 2 diabetes mellitus   Normocytic anemia   DJD (degenerative joint disease)   Foot ulcer, left   Septic shock(785.52)   Acute respiratory failure with hypoxia   . amLODipine  10 mg Oral Daily  . antiseptic oral rinse  15 mL Mouth Rinse QID  . hydrALAZINE  100 mg Oral QID  . insulin aspart  0-20 Units Subcutaneous Q4H  . insulin glargine  15 Units Subcutaneous Daily  . pantoprazole  40 mg Oral QHS  . vancomycin  1,000 mg Intravenous Q24H    Objective: Temp:  [98.1 F (36.7 C)-100.8 F (38.2 C)] 98.1 F (36.7 C) (02/09 1413) Pulse Rate:  [62-69] 65 (02/09 1413) Resp:  [19-26] 20 (02/09 1413) BP: (139-183)/(41-84) 168/69 mmHg (02/09 1413) SpO2:  [91 %-100 %] 93 % (02/09 1413) Weight:  [79.5 kg (175 lb 4.3 oz)] 79.5 kg (175 lb 4.3 oz) (02/09 0500)  General: She is comfortable sitting up in a chair Lungs: Clear Cor: Regular S1-S2 no murmur No change in dry ulcer over the medial left first MTP joint  Lab Results Lab Results  Component Value Date   WBC 23.4* 11/13/2013   HGB 7.7* 11/13/2013   HCT 22.5* 11/13/2013   MCV 84.0 11/13/2013   PLT 243 11/13/2013    Lab Results  Component Value Date   CREATININE 1.69* 11/13/2013   BUN 57* 11/13/2013   NA 132* 11/13/2013   K 3.9 11/13/2013   CL 96 11/13/2013   CO2 20 11/13/2013    Lab Results  Component Value Date   ALT <5 11/10/2013   AST 37 11/10/2013   ALKPHOS 109 11/10/2013    BILITOT 1.2 11/10/2013      Microbiology: Recent Results (from the past 240 hour(s))  URINE CULTURE     Status: None   Collection Time    11/04/13 11:50 AM      Result Value Range Status   Specimen Description URINE, CLEAN CATCH   Final   Special Requests NONE   Final   Culture  Setup Time     Final   Value: 11/04/2013 21:17     Performed at [place]     Final   Value: 65,000 COLONIES/ML     Performed at [facility]   Culture     Final   Value: METHICILLIN RESISTANT STAPHYLOCOCCUS AUREUS     Note: RIFAMPIN AND GENTAMICIN SHOULD NOT BE USED AS SINGLE DRUGS FOR TREATMENT OF STAPH INFECTIONS. CRITICAL RESULT CALLED TO, READ BACK BY AND VERIFIED WITH: JESSICA C@3 :05PM ON 11/06/13 BY DANTS     Performed at [facility]   Report Status 11/06/2013 FINAL   Final   Organism ID, Bacteria METHICILLIN RESISTANT STAPHYLOCOCCUS AUREUS   Final  STOOL CULTURE     Status: None   Collection Time    11/04/13  3:55 PM      Result Value Range Status   Specimen Description STOOL   Final   Special Requests NONE   Final   Culture     Final   Value: NO SALMONELLA, SHIGELLA, CAMPYLOBACTER, YERSINIA, OR E.COLI 0157:H7 ISOLATED     Performed at [facility]   Report Status 11/08/2013 FINAL   Final  CLOSTRIDIUM DIFFICILE BY PCR     Status: None   Collection Time    11/04/13  3:55 PM      Result Value Range Status   C difficile by pcr NEGATIVE  NEGATIVE Final  CULTURE, BLOOD (ROUTINE X 2)     Status: None   Collection Time    11/04/13  5:13 PM      Result Value Range Status   Specimen Description BLOOD LEFT HAND   Final   Special Requests BOTTLES DRAWN AEROBIC AND ANAEROBIC 10CC EACH   Final   Culture  Setup Time     Final   Value: 11/05/2013 20:50     Performed at [facility]   Culture     Final   Value: METHICILLIN RESISTANT STAPHYLOCOCCUS AUREUS     Note: RIFAMPIN AND GENTAMICIN SHOULD NOT BE USED AS SINGLE DRUGS FOR TREATMENT OF STAPH  INFECTIONS. CRITICAL RESULT CALLED TO, READ BACK BY AND VERIFIED WITH: JESSICA CHILDRESS@0913  ON [id number] BY [facility]     Note: Gram Stain Report Called to,Read Back By and Verified With: HAWKINS M AT 0729A ON [id number] BY THOMPSON S Performed at [facility]     Performed at [facility]   Report Status 11/07/2013 FINAL   Final   Organism ID, Bacteria METHICILLIN RESISTANT STAPHYLOCOCCUS AUREUS   Final  CULTURE, BLOOD (ROUTINE X 2)     Status: None   Collection Time    11/04/13  5:19 PM      Result Value Range Status   Specimen Description BLOOD RIGHT ARM   Final   Special Requests BOTTLES DRAWN AEROBIC AND ANAEROBIC 10CC EACH   Final   Culture  Setup Time     Final   Value: 11/05/2013 20:50     Performed at [facility]   Culture     Final   Value: STAPHYLOCOCCUS AUREUS     Note: SUSCEPTIBILITIES PERFORMED ON PREVIOUS CULTURE WITHIN THE LAST 5 DAYS.     Note: Gram Stain Report Called to,Read Back By and Verified With: HAWKINS M AT 0729A ON [id number] BY THOMPSON S Performed at [facility]     Performed at [facility]   Report Status 11/07/2013 FINAL   Final  CULTURE, BLOOD (ROUTINE X 2)     Status: None   Collection Time    11/06/13 12:28 PM      Result Value Range Status   Specimen Description BLOOD RIGHT HAND   Final   Special Requests     Final   Value: BOTTLES DRAWN AEROBIC AND ANAEROBIC 8CC EACH BOTTLE   Culture NO GROWTH 5 DAYS   Final   Report Status 11/11/2013 FINAL   Final  MRSA PCR SCREENING     Status: Abnormal   Collection Time    11/06/13 12:30 PM      Result Value Range Status   MRSA by PCR POSITIVE (*) NEGATIVE Final   Comment:            The GeneXpert MRSA Assay (FDA     approved for NASAL specimens  only), is one component of a     comprehensive MRSA colonization     surveillance program. It is not     intended to diagnose MRSA     infection nor to guide or     monitor treatment for     MRSA infections.     RESULT CALLED  TO, READ BACK BY AND VERIFIED WITH:     CRUISE,J. AT [id number] ON 11/06/2013 BY BAUGHAM,M.  CULTURE, BLOOD (ROUTINE X 2)     Status: None   Collection Time    11/06/13  1:30 PM      Result Value Range Status   Specimen Description BLOOD RIGHT HAND   Final   Special Requests     Final   Value: BOTTLES DRAWN AEROBIC AND ANAEROBIC AEB=6CC ANA=4CC   Culture  Setup Time     Final   Value: 11/08/2013 01:26     Performed at [facility]   Culture     Final   Value: STAPHYLOCOCCUS AUREUS     Note: SUSCEPTIBILITIES PERFORMED ON PREVIOUS CULTURE WITHIN THE LAST 5 DAYS.     Note: Performed at [facility] Gram Stain Report Called to,Read Back By and Verified With: Sheldon ON 11/07/13 BY Tyrone Schimke M     Performed at [facility]   Report Status 11/09/2013 FINAL   Final  CULTURE, BLOOD (ROUTINE X 2)     Status: None   Collection Time    11/08/13  8:40 PM      Result Value Range Status   Specimen Description BLOOD PICC LINE   Final   Special Requests BOTTLES DRAWN AEROBIC AND ANAEROBIC 10CC   Final   Culture  Setup Time     Final   Value: 11/09/2013 00:27     Performed at [facility]   Culture     Final   Value:        BLOOD CULTURE RECEIVED NO GROWTH TO DATE CULTURE WILL BE HELD FOR 5 DAYS BEFORE ISSUING A FINAL NEGATIVE REPORT     Performed at [facility]   Report Status PENDING   Incomplete  CULTURE, BLOOD (ROUTINE X 2)     Status: None   Collection Time    11/08/13  8:45 PM      Result Value Range Status   Specimen Description BLOOD LEFT ARM   Final   Special Requests BOTTLES DRAWN AEROBIC AND ANAEROBIC 10CC   Final   Culture  Setup Time     Final   Value: 11/09/2013 00:26     Performed at [facility]   Culture     Final   Value:        BLOOD CULTURE RECEIVED NO GROWTH TO DATE CULTURE WILL BE HELD FOR 5 DAYS BEFORE ISSUING A FINAL NEGATIVE REPORT     Performed at [facility]   Report Status PENDING   Incomplete  URINE  CULTURE     Status: None   Collection Time    11/09/13 12:06 PM      Result Value Range Status   Specimen Description URINE, CATHETERIZED   Final   Special Requests Normal   Final   Culture  Setup Time     Final   Value: 11/09/2013 13:57     Performed at [place]     Final   Value: NO GROWTH     Performed at [facility]   Culture     Final   Value: NO GROWTH     Performed at [facility]   Report Status 11/10/2013 FINAL   Final    Studies/Results: No results found.  Assessment: She is improving slowly on therapy for MRSA bacteremia and endocarditis involving her former ICD wires and tricuspid valve. A total of 6 weeks of vancomycin therapy for complicated right-sided MRSA endocarditis.  Plan: 1. Continue vancomycin 2. I will followup on Wednesday, February 11  Michel Bickers, MD [facility] [phone] pager   [phone] cell 11/13/2013, 2:20 PM

## 2013-11-13 NOTE — Progress Notes (Signed)
Report called to [facility] receiving nurse. Current BP 165/45, 10 mg hydralazine given as well as pain medication. Family called and informed of new room

## 2013-11-13 NOTE — Progress Notes (Signed)
Patient: [name] Date of Encounter: 11/13/2013, 7:38 AM Admit date: 11/04/2013     Subjective  [name] has no complaints this AM. She denies CP or SOB.   Objective  Physical Exam: Vitals: BP 183/52  Pulse 69  Temp(Src) 99.8 F (37.7 C) (Oral)  Resp 21  Ht 5\' 3"  (1.6 m)  Wt 175 lb 4.3 oz (79.5 kg)  BMI 31.05 kg/m2  SpO2 97% General: Well developed 68 year old female in no acute distress. Neck: Supple. JVD not elevated. Lungs: Clear bilaterally to auscultation without wheezes, rales, or rhonchi. Breathing is unlabored. Heart: RRR S1 S2 without murmurs, rubs, or gallops.  Abdomen: Soft, non-distended. Extremities: No clubbing or cyanosis. LUE edema noted. No LE edema.   Neuro: Alert and oriented X 3. Moves all extremities spontaneously. No focal deficits.  Intake/Output:  Intake/Output Summary (Last 24 hours) at 11/13/13 0738 Last data filed at 11/13/13 0600  Gross per 24 hour  Intake    390 ml  Output   1145 ml  Net   -755 ml    Inpatient Medications:  . amLODipine  10 mg Oral Daily  . antiseptic oral rinse  15 mL Mouth Rinse QID  . hydrALAZINE  100 mg Oral TID  . insulin aspart  0-20 Units Subcutaneous Q4H  . insulin glargine  15 Units Subcutaneous Daily  . pantoprazole  40 mg Oral QHS  . vancomycin  1,000 mg Intravenous Q48H   . sodium chloride    . fentaNYL infusion INTRAVENOUS Stopped (11/10/13 1116)    Labs:  Recent Labs  11/11/13 0423 11/12/13 0400  NA 136* 135*  K 3.6* 3.5*  CL 101 99  CO2 20 21  GLUCOSE 135* 138*  BUN 75* 67*  CREATININE 2.63* 1.97*  CALCIUM 7.5* 7.6*  MG 2.2 2.3  PHOS 2.8 4.0    Recent Labs  11/12/13 0400  WBC 20.7*  HGB 7.4*  HCT 21.0*  MCV 83.3  PLT 201    Radiology/Studies: Dg Chest Port 1 View  11/10/2013   CLINICAL DATA:  Respiratory distress, intubated  EXAM: PORTABLE CHEST - 1 VIEW  COMPARISON:  Portable exam 0709 hr compared to 11/09/2013  FINDINGS: Tip of endotracheal tube projects 2.1 cm  above carinal.  Nasogastric tube extends into stomach.  Right arm PICC line tip projects over SVC above cavoatrial junction.  External pacing leads and EKG leads are superimposed with the chest.  Enlargement of cardiac silhouette with pulmonary vascular congestion.  Mild perihilar infiltrates bilaterally.  No gross pleural effusion or pneumothorax.  IMPRESSION: Enlargement of cardiac silhouette with pulmonary vascular congestion mild perihilar infiltrates question edema.   Electronically Signed   By: Lavonia Dana M.D.   On: 11/10/2013 08:03   Dg Abd Portable 1v  11/09/2013   CLINICAL DATA:  Orogastric tube placement.  EXAM: PORTABLE ABDOMEN - 1 VIEW  COMPARISON:  02/20/2011  FINDINGS: Nasogastric tube curls within the stomach, well positioned.  There is a femoral a placed right iliac vein introducer sheath.  Soft tissues are otherwise unremarkable.  Normal bowel gas pattern.  IMPRESSION: Nasogastric tube is well positioned curling within the stomach.   Electronically Signed   By: Lajean Manes M.D.   On: 11/09/2013 10:27    Telemetry: SR; no arrhythmias   Assessment and Plan  1. MRSA endocarditis  - continue abx per ID 2. ICD system extraction 11/09/2013 - 1.5 X 1.6 vegetation, subsequently embolized to lung  - with acute respiratory failure,  now extubated and off pressors 3. Acute on chronic renal insufficiency  - improving; BMET pending this AM 4. Non-ischemic CM, with EF improvement following BiV ICD implant  - will follow volume status closely without CRT   - resume CHF meds 5. MRSA pneumonia 6. Anemia - CBC pending this AM  Signed, EDMISTEN, BROOKE PA-C  EP Attending  Patient seen and examined. Agree with above exam, assessment and plan. She continues to improve. Would consider PT consult if not already following.   Mikle Bosworth.D.

## 2013-11-13 NOTE — Progress Notes (Addendum)
ANTIBIOTIC CONSULT NOTE - FOLLOW UP  Pharmacy Consult:  Vancomycin Indication:  MRSA bacteremia + endocarditis + possible osteomyelitis  Allergies  Allergen Reactions  . Hydrocodone-Acetaminophen Other (See Comments)    hallucinations    Patient Measurements: Height: 5\' 3"  (160 cm) Weight: 175 lb 4.3 oz (79.5 kg) IBW/kg (Calculated) : 52.4  Vital Signs: Temp: 100.8 F (38.2 C) (02/09 0850) Temp src: Oral (02/09 0850) BP: 183/52 mmHg (02/09 0700) Pulse Rate: 69 (02/09 0700) Intake/Output from previous day: 02/08 0701 - 02/09 0700 In: 390 [I.V.:190; IV Piggyback:200] Out: 1145 [Urine:1145] Intake/Output from this shift: Total I/O In: -  Out: 100 [Urine:100]  Labs:  Recent Labs  11/11/13 0423 11/12/13 0400  WBC  --  20.7*  HGB  --  7.4*  PLT  --  201  CREATININE 2.63* 1.97*   Estimated Creatinine Clearance: 27.6 ml/min (by C-G formula based on Cr of 1.97). No results found for this basename: Letta Median, VANCORANDOM, GENTTROUGH, GENTPEAK, GENTRANDOM, TOBRATROUGH, TOBRAPEAK, TOBRARND, AMIKACINPEAK, AMIKACINTROU, AMIKACIN,  in the last 72 hours   Microbiology: Recent Results (from the past 720 hour(s))  URINE CULTURE     Status: None   Collection Time    11/04/13 11:50 AM      Result Value Range Status   Specimen Description URINE, CLEAN CATCH   Final   Special Requests NONE   Final   Culture  Setup Time     Final   Value: 11/04/2013 21:17     Performed at [place]     Final   Value: 65,000 COLONIES/ML     Performed at [facility]   Culture     Final   Value: METHICILLIN RESISTANT STAPHYLOCOCCUS AUREUS     Note: RIFAMPIN AND GENTAMICIN SHOULD NOT BE USED AS SINGLE DRUGS FOR TREATMENT OF STAPH INFECTIONS. CRITICAL RESULT CALLED TO, READ BACK BY AND VERIFIED WITH: JESSICA C@3 :05PM ON 11/06/13 BY DANTS     Performed at [facility]   Report Status 11/06/2013 FINAL   Final   Organism ID, Bacteria METHICILLIN  RESISTANT STAPHYLOCOCCUS AUREUS   Final  STOOL CULTURE     Status: None   Collection Time    11/04/13  3:55 PM      Result Value Range Status   Specimen Description STOOL   Final   Special Requests NONE   Final   Culture     Final   Value: NO SALMONELLA, SHIGELLA, CAMPYLOBACTER, YERSINIA, OR E.COLI 0157:H7 ISOLATED     Performed at [facility]   Report Status 11/08/2013 FINAL   Final  CLOSTRIDIUM DIFFICILE BY PCR     Status: None   Collection Time    11/04/13  3:55 PM      Result Value Range Status   C difficile by pcr NEGATIVE  NEGATIVE Final  CULTURE, BLOOD (ROUTINE X 2)     Status: None   Collection Time    11/04/13  5:13 PM      Result Value Range Status   Specimen Description BLOOD LEFT HAND   Final   Special Requests BOTTLES DRAWN AEROBIC AND ANAEROBIC 10CC EACH   Final   Culture  Setup Time     Final   Value: 11/05/2013 20:50     Performed at [facility]   Culture     Final   Value: METHICILLIN RESISTANT STAPHYLOCOCCUS AUREUS     Note: RIFAMPIN AND GENTAMICIN SHOULD NOT BE USED AS  SINGLE DRUGS FOR TREATMENT OF STAPH INFECTIONS. CRITICAL RESULT CALLED TO, READ BACK BY AND VERIFIED WITH: JESSICA CHILDRESS@0913  ON [id number] BY [place]     Note: Gram Stain Report Called to,Read Back By and Verified With: HAWKINS M AT 0729A ON [id number] BY THOMPSON S Performed at [facility]     Performed at [facility]   Report Status 11/07/2013 FINAL   Final   Organism ID, Bacteria METHICILLIN RESISTANT STAPHYLOCOCCUS AUREUS   Final  CULTURE, BLOOD (ROUTINE X 2)     Status: None   Collection Time    11/04/13  5:19 PM      Result Value Range Status   Specimen Description BLOOD RIGHT ARM   Final   Special Requests BOTTLES DRAWN AEROBIC AND ANAEROBIC 10CC EACH   Final   Culture  Setup Time     Final   Value: 11/05/2013 20:50     Performed at [facility]   Culture     Final   Value: STAPHYLOCOCCUS AUREUS     Note: SUSCEPTIBILITIES PERFORMED ON PREVIOUS  CULTURE WITHIN THE LAST 5 DAYS.     Note: Gram Stain Report Called to,Read Back By and Verified With: HAWKINS M AT 0729A ON [id number] BY THOMPSON S Performed at [facility]     Performed at [facility]   Report Status 11/07/2013 FINAL   Final  CULTURE, BLOOD (ROUTINE X 2)     Status: None   Collection Time    11/06/13 12:28 PM      Result Value Range Status   Specimen Description BLOOD RIGHT HAND   Final   Special Requests     Final   Value: BOTTLES DRAWN AEROBIC AND ANAEROBIC 8CC EACH BOTTLE   Culture NO GROWTH 5 DAYS   Final   Report Status 11/11/2013 FINAL   Final  MRSA PCR SCREENING     Status: Abnormal   Collection Time    11/06/13 12:30 PM      Result Value Range Status   MRSA by PCR POSITIVE (*) NEGATIVE Final   Comment:            The GeneXpert MRSA Assay (FDA     approved for NASAL specimens     only), is one component of a     comprehensive MRSA colonization     surveillance program. It is not     intended to diagnose MRSA     infection nor to guide or     monitor treatment for     MRSA infections.     RESULT CALLED TO, READ BACK BY AND VERIFIED WITH:     CRUISE,J. AT [id number] ON 11/06/2013 BY BAUGHAM,M.  CULTURE, BLOOD (ROUTINE X 2)     Status: None   Collection Time    11/06/13  1:30 PM      Result Value Range Status   Specimen Description BLOOD RIGHT HAND   Final   Special Requests     Final   Value: BOTTLES DRAWN AEROBIC AND ANAEROBIC AEB=6CC ANA=4CC   Culture  Setup Time     Final   Value: 11/08/2013 01:26     Performed at [facility]   Culture     Final   Value: STAPHYLOCOCCUS AUREUS     Note: SUSCEPTIBILITIES PERFORMED ON PREVIOUS CULTURE WITHIN THE LAST 5 DAYS.     Note: Performed at [facility] Gram Stain Report Called to,Read Back By and Verified With: Maryella Shivers  AT [id number] ON 11/07/13 BY Elza Rafter     Performed at [facility]   Report Status 11/09/2013 FINAL   Final  CULTURE, BLOOD (ROUTINE X 2)     Status: None    Collection Time    11/08/13  8:40 PM      Result Value Range Status   Specimen Description BLOOD PICC LINE   Final   Special Requests BOTTLES DRAWN AEROBIC AND ANAEROBIC 10CC   Final   Culture  Setup Time     Final   Value: 11/09/2013 00:27     Performed at [facility]   Culture     Final   Value:        BLOOD CULTURE RECEIVED NO GROWTH TO DATE CULTURE WILL BE HELD FOR 5 DAYS BEFORE ISSUING A FINAL NEGATIVE REPORT     Performed at [facility]   Report Status PENDING   Incomplete  CULTURE, BLOOD (ROUTINE X 2)     Status: None   Collection Time    11/08/13  8:45 PM      Result Value Range Status   Specimen Description BLOOD LEFT ARM   Final   Special Requests BOTTLES DRAWN AEROBIC AND ANAEROBIC 10CC   Final   Culture  Setup Time     Final   Value: 11/09/2013 00:26     Performed at [facility]   Culture     Final   Value:        BLOOD CULTURE RECEIVED NO GROWTH TO DATE CULTURE WILL BE HELD FOR 5 DAYS BEFORE ISSUING A FINAL NEGATIVE REPORT     Performed at [facility]   Report Status PENDING   Incomplete  URINE CULTURE     Status: None   Collection Time    11/09/13 12:06 PM      Result Value Range Status   Specimen Description URINE, CATHETERIZED   Final   Special Requests Normal   Final   Culture  Setup Time     Final   Value: 11/09/2013 13:57     Performed at [facility]     Final   Value: NO GROWTH     Performed at [facility]   Culture     Final   Value: NO GROWTH     Performed at [facility]   Report Status 11/10/2013 FINAL   Final     Assessment: 2 YOF admitted for DKA, fever, and FTT found to have MRSA bacteremia, septic endocarditis, and possible osteomyelitis of left first MTP.  Patient continues on vancomycin therapy.  Her renal function is improving and she is making some urine.  Vanc 2/1 >> Rifampin 2/2 >>2/6  2/5 Urine - negative 2/4 Blood - NGTD 2/2 Blood - MRSA x1 1/31 Stool  - negative 1/31 Blood -  MRSA x2 (sens vanc, bactrim, rifampin) 1/31 Urine - 65k MRSA 1/31 Cdiff PCR - positive   Goal of Therapy:  Vancomycin trough level 15-20 mcg/ml   Plan:  - Change vanc to 1gm IV Q24H - Monitor renal fxn, clinical course, vanc trough soon - F/U KCL supplementation, med adjustment for better BP control    Sya Nestler D. Mina Marble, PharmD, BCPS Pager:  [phone] 11/13/2013, 9:14 AM

## 2013-11-13 NOTE — Progress Notes (Signed)
Name: [name] MRN: [id number] DOB: 06-Jan-1946    ADMISSION DATE:  11/04/2013 CONSULTATION DATE:  11/09/2013  REFERRING MD :  [facility] PRIMARY SERVICE: PCCM  CHIEF COMPLAINT:  shock  BRIEF PATIENT DESCRIPTION: Patient is a 68 yo female with history of DM 2, HTN, CHF (EF 20%), CAD, PAD, ICD, Depression who presented to the hospital for hyperglycemia and failure to thrive, also with fever. During initial hospitalization she had blood cultures drawn that revealed MRSA. She has been treated with cefazolin and rifampin. She had her ICD removed today for concern for septic source and developed hypotension following the procedure.   SIGNIFICANT EVENTS / STUDIES:  2/1 admission to [facility] for hyperglycemia, on insulin drip  2/1 BCx with MRSA, started on vanc  2/2 ID c/s and rifampin and cefazolin added  2/3 TTE EF 45% global hypokinesis, moderate LVH, grade 1 diastolic dysfunction, shadowing at level of tricuspid valve (vegetation cannot be completely excluded).  2/4 transferred to [facility] from [facility]  2/5- removal wires, shock, ETT 2/5 pressors off 2/6 extubated 2/9 transfer to floor  LINES / TUBES: 2/2 PICC >>>  2/5 ETT >>> 2/6 2/5 rt cordis>>>  CULTURES: 1/31 Stool Cx >>> no growth  1/31 BCx x2 >>> MRSA  1/31 UCx >>> MRSA  1/31 C diff >>> neg  2/2 Nasal PCR >>> MRSA  2/2 BCx >>> staph aureus, awaiting susceptibilities  2/2 BCx >>> negative 2/4 BCx x2 >>> NGTD 2/5 UCx >>> negative  ANTIBIOTICS: 2/2 Vanc >>>  2/2 Rifampin >>> 2/6 2/2 cefazolin >>> 2/3  SUBJECTIVE:  States doing well. Still states can't feel herself have a BM.  VITAL SIGNS: Temp:  [98.7 F (37.1 C)-100.6 F (38.1 C)] 99.8 F (37.7 C) (02/09 0425) Pulse Rate:  [61-69] 62 (02/09 0300) Resp:  [19-25] 24 (02/09 0300) BP: (139-183)/(41-84) 139/84 mmHg (02/09 0200) SpO2:  [91 %-99 %] 91 % (02/09 0300) Weight:  [175 lb 4.3 oz (79.5 kg)] 175 lb 4.3 oz (79.5 kg) (02/09 0500) HEMODYNAMICS:   VENTILATOR  SETTINGS:   INTAKE / OUTPUT: Intake/Output     02/08 0701 - 02/09 0700 02/09 0701 - 02/10 0700   I.V. (mL/kg) 190 (2.4)    IV Piggyback 200    Total Intake(mL/kg) 390 (4.9)    Urine (mL/kg/hr) 1115 (0.6)    Total Output 1115     Net -725            PHYSICAL EXAMINATION: General: comfortable appearing  Neuro: awake and interacting HEENT: NCAT Cardiovascular: rrr, no murmur appreciated  Lungs: clear breath sounds, no wheezing  Abdomen: Soft, ND, hypoactive BS  Musculoskeletal: No gross deformities  Skin: Left 1st MTP with bandage over ulceration, left chest with c/d/i incision and sutures  LABS:  CBC  Recent Labs Lab 11/09/13 1230 11/10/13 0500 11/12/13 0400  WBC 19.8* 24.4* 20.7*  HGB 8.8* 8.1* 7.4*  HCT 24.0* 22.6* 21.0*  PLT 101* 134* 201   Coag's  Recent Labs Lab 11/07/13 1853 11/09/13 1230  APTT  --  37  INR 1.27 1.51*   BMET  Recent Labs Lab 11/10/13 0500 11/11/13 0423 11/12/13 0400  NA 132* 136* 135*  K 3.8 3.6* 3.5*  CL 101 101 99  CO2 16* 20 21  BUN 73* 75* 67*  CREATININE 3.27* 2.63* 1.97*  GLUCOSE 130* 135* 138*   Electrolytes  Recent Labs Lab 11/10/13 0500 11/10/13 1500 11/11/13 0423 11/12/13 0400  CALCIUM 7.8*  --  7.5* 7.6*  MG  --  2.1 2.2 2.3  PHOS  --  3.6 2.8 4.0   Sepsis Markers  Recent Labs Lab 11/09/13 1230  LATICACIDVEN 1.0   ABG  Recent Labs Lab 11/10/13 0725 11/10/13 1059 11/10/13 1152  PHART 7.297* 7.325* 7.369  PCO2ART 34.5* 33.5* 28.9*  PO2ART 111.0* 125.0* 79.0*   Liver Enzymes  Recent Labs Lab 11/07/13 0430 11/10/13 0500  AST 23 37  ALT 10 <5  ALKPHOS 107 109  BILITOT 1.0 1.2  ALBUMIN 1.9* 1.9*   Cardiac Enzymes  Recent Labs Lab 11/09/13 1230  TROPONINI 0.55*   Glucose  Recent Labs Lab 11/12/13 0719 11/12/13 1124 11/12/13 1525 11/12/13 1949 11/13/13 0007 11/13/13 0414  GLUCAP 113* 216* 167* 179* 96 73    Imaging No results found.   ASSESSMENT / PLAN:  PULMONARY   A: acute respiratory failure likely related to septic emboli, uncompensated metabolic acidosis, resolved P:  - Push pulm hygiene as patient is able.  CARDIOVASCULAR  A: septic Shock / SIRS post removal wires - improved shock state pulmonary septic emboli  S/p ICD removal  SVC thrombus  CHF EF 45%  H/o HTN and worsening Troponin 0.55, likely related to hypotensive episode / demand  Relative AI - cortisol 16.8 P:  - Cards following  - Avoid neck lines with SVC clot  - Home hydralazine + prn IV hydralazine, increase to QID. - Amlodipine, if remains HTN post increase in hydralazine then will add coreg. - Remove cordis  RENAL  A: Acute on chronic kidney disease, baseline Cr 1.3 - worsened post hypotensive event -ATN > resolving Hyponatremia improving  Anion/non-anion gap metabolic acidosis  P:  - 1/2 NS KVO. - Follow BMP.  GASTROINTESTINAL  A: no acute issues P:  - PPI GI protection. - Soft diet today.  HEMATOLOGIC  A: Anemic Leukocytosis in setting of MRSA infection  P:  - F/u CBC. - SCDs VTE prophylaxis.  INFECTIOUS  A: MRSA bacteremia  Septic endocarditis  Left first MTP ulceration foot XR with concern for osteomylitis  P:  - Continue vanco. - ID following. - F/u repeat BCx  - Monitor left foot ulceration - May need PICC replaced after negative cultures depending on duration recommendations per ID.  ENDOCRINE  A: DM2  P:   - Monitor cbg - SSI  - Continue lantus 15 u daily   NEUROLOGIC  A: encephalopathy, improved Incontinence of stool > pt concerned she can not feel  P:  - Fentanyl for pain control. - Continue to monitor bowel pattern. - Ambulate out of bed. - PT consult.  Improved. On vanc. Will need long term treatment. Will transfer to tele.  Tommi Rumps, MD 11/13/2013, 7:10 AM  Transfer to tele and to [facility], PCCM will sign off, please call back if needed.  Patient seen and examined, agree with above note.  I dictated the care and orders  written for this patient under my direction.  Rush Farmer, MD [phone]

## 2013-11-14 DIAGNOSIS — I330 Acute and subacute infective endocarditis: Secondary | ICD-10-CM

## 2013-11-14 DIAGNOSIS — R7881 Bacteremia: Secondary | ICD-10-CM

## 2013-11-14 LAB — BASIC METABOLIC PANEL
BUN: 59 mg/dL — ABNORMAL HIGH (ref 6–23)
CHLORIDE: 97 meq/L (ref 96–112)
CO2: 21 mEq/L (ref 19–32)
CREATININE: 1.76 mg/dL — AB (ref 0.50–1.10)
Calcium: 7.6 mg/dL — ABNORMAL LOW (ref 8.4–10.5)
GFR calc Af Amer: 33 mL/min — ABNORMAL LOW (ref >90)
GFR calc non Af Amer: 29 mL/min — ABNORMAL LOW (ref >90)
Glucose, Bld: 164 mg/dL — ABNORMAL HIGH (ref 70–99)
POTASSIUM: 3.9 meq/L (ref 3.7–5.3)
SODIUM: 133 meq/L — AB (ref 137–147)

## 2013-11-14 LAB — GLUCOSE, CAPILLARY
GLUCOSE-CAPILLARY: 159 mg/dL — AB (ref 70–99)
GLUCOSE-CAPILLARY: 214 mg/dL — AB (ref 70–99)
Glucose-Capillary: 131 mg/dL — ABNORMAL HIGH (ref 70–99)
Glucose-Capillary: 161 mg/dL — ABNORMAL HIGH (ref 70–99)

## 2013-11-14 MED ORDER — FUROSEMIDE 10 MG/ML IJ SOLN
40.0000 mg | Freq: Two times a day (BID) | INTRAMUSCULAR | Status: DC
  Administered 2013-11-14 – 2013-11-17 (×7): 40 mg via INTRAVENOUS
  Filled 2013-11-14 (×8): qty 4

## 2013-11-14 MED ORDER — CARVEDILOL 12.5 MG PO TABS
12.5000 mg | ORAL_TABLET | Freq: Two times a day (BID) | ORAL | Status: DC
  Administered 2013-11-14 – 2013-11-20 (×13): 12.5 mg via ORAL
  Filled 2013-11-14 (×14): qty 1

## 2013-11-14 NOTE — Progress Notes (Signed)
Rehab Admissions Coordinator Note:  Patient was screened by Ema Hebner L for appropriateness for an Inpatient Acute Rehab Consult.  At this time, we are recommending Inpatient Rehab consult.  Lakiya Cottam, PT Rehabilitation Admissions Coordinator [phone]  

## 2013-11-14 NOTE — Progress Notes (Signed)
Patient ID: [id number] NIEDRINGHAUS, female   DOB: March 12, 1946, 68 y.o.   MRN: [id number]   Patient Name: [name] Date of Encounter: 11/14/2013     Principal Problem:   MRSA bacteremia Active Problems:   HYPERLIPIDEMIA   CORONARY ATHEROSCLEROSIS NATIVE CORONARY ARTERY   Nonischemic cardiomyopathy   Chronic systolic heart failure   PAD (peripheral artery disease)   Biventricular implantable cardioverter-defibrillator in situ   Atherosclerosis of native arteries of the extremities with intermittent claudication   Hyperglycemia   Type 2 diabetes mellitus   Normocytic anemia   DJD (degenerative joint disease)   Foot ulcer, left   Septic shock(785.52)   Acute respiratory failure with hypoxia    SUBJECTIVE  No chest pain or sob. Still weak.  CURRENT MEDS . amLODipine  10 mg Oral Daily  . antiseptic oral rinse  15 mL Mouth Rinse QID  . hydrALAZINE  100 mg Oral QID  . insulin aspart  0-15 Units Subcutaneous TID WC  . insulin aspart  0-5 Units Subcutaneous QHS  . insulin glargine  15 Units Subcutaneous Daily  . pantoprazole  40 mg Oral QHS  . vancomycin  1,000 mg Intravenous Q24H    OBJECTIVE  Filed Vitals:   11/13/13 1413 11/13/13 2105 11/14/13 0052 11/14/13 0641  BP: 168/69 157/57 138/50 163/60  Pulse: 65 65  73  Temp: 98.1 F (36.7 C) 99.1 F (37.3 C)  99.1 F (37.3 C)  TempSrc: Oral Oral  Oral  Resp: 20 18  20   Height:      Weight:    181 lb 7 oz (82.3 kg)  SpO2: 93% 99%  97%    Intake/Output Summary (Last 24 hours) at 11/14/13 0904 Last data filed at 11/13/13 1018  Gross per 24 hour  Intake    440 ml  Output      0 ml  Net    440 ml   Filed Weights   11/12/13 0500 11/13/13 0500 11/14/13 0641  Weight: 175 lb 7.8 oz (79.6 kg) 175 lb 4.3 oz (79.5 kg) 181 lb 7 oz (82.3 kg)    PHYSICAL EXAM  General: Pleasant, NAD. Neuro: Alert and oriented X 3. Moves all extremities spontaneously. Psych: Normal affect. HEENT:  Normal  Neck: Supple without bruits  or JVD. Lungs:  Resp regular and unlabored, CTA. Incision without hematoma Heart: RRR no s3, s4, or murmurs. Abdomen: Soft, non-tender, non-distended, BS + x 4.  Extremities: No clubbing, cyanosis or edema. DP/PT/Radials 2+ and equal bilaterally.  Accessory Clinical Findings  CBC  Recent Labs  11/12/13 0400 11/13/13 0800  WBC 20.7* 23.4*  HGB 7.4* 7.7*  HCT 21.0* 22.5*  MCV 83.3 84.0  PLT 201 0000000   Basic Metabolic Panel  Recent Labs  11/12/13 0400 11/13/13 0800 11/14/13 0406  NA 135* 132* 133*  K 3.5* 3.9 3.9  CL 99 96 97  CO2 21 20 21   GLUCOSE 138* 80 164*  BUN 67* 57* 59*  CREATININE 1.97* 1.69* 1.76*  CALCIUM 7.6* 7.7* 7.6*  MG 2.3 2.3  --   PHOS 4.0 3.7  --    Liver Function Tests No results found for this basename: AST, ALT, ALKPHOS, BILITOT, PROT, ALBUMIN,  in the last 72 hours No results found for this basename: LIPASE, AMYLASE,  in the last 72 hours Cardiac Enzymes No results found for this basename: CKTOTAL, CKMB, CKMBINDEX, TROPONINI,  in the last 72 hours BNP No components found with this basename: POCBNP,  D-Dimer No results  found for this basename: DDIMER,  in the last 72 hours Hemoglobin A1C No results found for this basename: HGBA1C,  in the last 72 hours Fasting Lipid Panel No results found for this basename: CHOL, HDL, LDLCALC, TRIG, CHOLHDL, LDLDIRECT,  in the last 72 hours Thyroid Function Tests No results found for this basename: TSH, T4TOTAL, FREET3, T3FREE, THYROIDAB,  in the last 72 hours  TELE nsr   Radiology/Studies  Dg Chest 2 View  11/04/2013   CLINICAL DATA:  Weakness  EXAM: CHEST - 2 VIEW  COMPARISON:  05/09/2013  FINDINGS: Stable cardiomegaly. Lungs clear. Stable left subclavian AICD. Orthopedic anchor in the right humeral head. . No effusion.  IMPRESSION: Stable cardiomegaly and postop change.  No acute disease.   Electronically Signed   By: Arne Cleveland M.D.   On: 11/04/2013 09:43   Dg Chest Port 1 View  11/10/2013    CLINICAL DATA:  Respiratory distress, intubated  EXAM: PORTABLE CHEST - 1 VIEW  COMPARISON:  Portable exam 0709 hr compared to 11/09/2013  FINDINGS: Tip of endotracheal tube projects 2.1 cm above carinal.  Nasogastric tube extends into stomach.  Right arm PICC line tip projects over SVC above cavoatrial junction.  External pacing leads and EKG leads are superimposed with the chest.  Enlargement of cardiac silhouette with pulmonary vascular congestion.  Mild perihilar infiltrates bilaterally.  No gross pleural effusion or pneumothorax.  IMPRESSION: Enlargement of cardiac silhouette with pulmonary vascular congestion mild perihilar infiltrates question edema.   Electronically Signed   By: Lavonia Dana M.D.   On: 11/10/2013 08:03   Dg Chest Port 1 View  11/09/2013   CLINICAL DATA:  Hypoxia  EXAM: PORTABLE CHEST - 1 VIEW  COMPARISON:  November 06, 2013  FINDINGS: Endotracheal tube tip is 3.2 cm above the carina. Central catheter tip is in the superior vena cava near the junction with the right atrium. Nasogastric tube is coiled in the stomach. Pacemaker no longer appreciable. No pneumothorax.  There is no edema or consolidation. Heart is moderately enlarged with normal pulmonary vascularity, stable. No adenopathy.  IMPRESSION: Tube and catheter positions as described without pneumothorax. Stable cardiac enlargement. No edema or consolidation.   Electronically Signed   By: Lowella Grip M.D.   On: 11/09/2013 10:21   Dg Chest Port 1 View  11/06/2013   CLINICAL DATA:  Status post PICC line placement  EXAM: PORTABLE CHEST - 1 VIEW  COMPARISON:  11/04/2013  FINDINGS: Cardiac shadow is stable. A defibrillator is again seen. A new right-sided PICC line is noted with the catheter tip in the mid right atrium. The lungs are clear. No acute bony abnormality is noted.  IMPRESSION: New right PICC line with the tip in the mid right atrium.   Electronically Signed   By: Inez Catalina M.D.   On: 11/06/2013 15:02   Dg Knee Right  Port  11/07/2013   CLINICAL DATA:  Pain and swelling  EXAM: PORTABLE RIGHT KNEE - 1-2 VIEW  COMPARISON:  None.  FINDINGS: Frontal and lateral views were obtained. There is a small joint effusion. No fracture or dislocation. There is mild generalized joint space narrowing. No erosive change or bony destruction. There is extensive arterial vascular calcification.  IMPRESSION: Multifocal osteoarthritic change. Small joint effusion. No fracture or bony destruction. There is extensive arterial vascular calcification.   Electronically Signed   By: Lowella Grip M.D.   On: 11/07/2013 08:29   Dg Abd Portable 1v  11/09/2013   CLINICAL DATA:  Orogastric tube placement.  EXAM: PORTABLE ABDOMEN - 1 VIEW  COMPARISON:  02/20/2011  FINDINGS: Nasogastric tube curls within the stomach, well positioned.  There is a femoral a placed right iliac vein introducer sheath.  Soft tissues are otherwise unremarkable.  Normal bowel gas pattern.  IMPRESSION: Nasogastric tube is well positioned curling within the stomach.   Electronically Signed   By: Lajean Manes M.D.   On: 11/09/2013 10:27   Dg Foot 2 Views Left  11/08/2013   CLINICAL DATA:  Chronic ulcer  EXAM: LEFT FOOT - 2 VIEW  COMPARISON:  None.  FINDINGS: There is a skin defect medial to the head of the first metatarsal. There is cortical demineralization in the adjacent head of the first metatarsal. No definite involvement of the subchondral cortex or MTP joint. There is a hallux valgus deformity. Negative for fracture. Otherwise normal mineralization and alignment. Small calcaneal spur at the plantar aponeurosis.  IMPRESSION: 1. Demineralization in the lateral aspect head first metatarsal, with an overlying soft tissue defect, suggesting osteomyelitis.   Electronically Signed   By: Arne Cleveland M.D.   On: 11/08/2013 21:46    ASSESSMENT AND PLAN 1. MRSA infection 2. ICD infection, s/p extraction 3. Anemia 4. HTN 5. Debilitation Rec: At this point, I would ask PT to  advance activity. She will need long term anti-biotics. As kidney function stable, and blood pressure now high, consider restarting low dose beta blocker (coreg 3.125 bid) and ARB (losartan 25 daily)  Gregg Taylor,M.D.  Gregg Taylor,M.D.  11/14/2013 9:04 AM

## 2013-11-14 NOTE — Progress Notes (Signed)
[facility] PROGRESS NOTE Interim History: 68 year old that came in to admit and further fevers, nonketotic hyperglycemic state was found to have MRSA on her urine and and 2 out of 2 blood cultures positive for MRSA, she also has a biventricular defibrillator in situ. Currently on an insulin drip and on Vanc and Rifampin. Infectious disease Dr. Graylon Good was consulted and recommended transfer her to come. Will come to the step down unit on an insulin drip and will probably need cardiology for TEE. Admitted to [facility] develop septic shock requiring pressors and intubated. Had removal of her ICD and wire.   Assessment/Plan: Acute respiratory failure likely related to septic emboli, uncompensated metabolic acidosis, Septic shock: - Intubated 2.5.2015 extubated on 2.6.2015. -   Septic shock due MRSA Bacteremia/Leukocytosis/endocarditis: - Requiring pressor on 2.5.2015-2.6.2015. - S/p ICD and wire removal, with SVC thrombus. - Started empirically on Vanc 2.2.2015. PICC line placed. - Mild elevation on trops likely related to hypotension ande demand ischemia.  Left first MTP ulceration on X-ray concern for osteomyelitis: - Cont vanc.  - ID rec holding on MRI  Nonischemic cardiomyopathy/Chronic systolic heart failure EF 45%: - resume coreg, d/c norvasc, cont hydralzine, add lasix has JVD and crackles on the right. - cont to monitor HR and SBP. - cont to add medications as tolerate for her HF. - appriciate cardiology assistance.  AKI on chronic kidney disease/diabetic nephropathyHyponatremia: - Due to septic shock - Improving with with treatment of shock - baseline Cr 1.61.8  Diabete II: - cont lantus and SSI.  Acute encephalopathy: - monitor BM. - able to answer questions.  Code Status: full  Family Communication: No family at bedside  Disposition Plan: Pending improvement in condition and specialist recommendations.    Consultants:  ID  Cardiology  PCCM  SIGNIFICANT  EVENTS / STUDIES:  2/1 admission to [facility] for hyperglycemia, on insulin drip  2/1 BCx with MRSA, started on vanc  2/2 ID c/s and rifampin and cefazolin added  2/3 TTE EF 45% global hypokinesis, moderate LVH, grade 1 diastolic dysfunction, shadowing at level of tricuspid valve (vegetation cannot be completely excluded).  2/4 transferred to [facility] from [facility]  2/5- removal wires, shock, ETT  2/5 pressors off  2/6 extubated   LINES / TUBES:  2/2 PICC >>>  2/5 ETT >>> 2/6  2/5 rt cordis>>>   CULTURES:  1/31 Stool Cx >>> no growth  1/31 BCx x2 >>> MRSA  1/31 UCx >>> MRSA  1/31 C diff >>> neg  2/2 Nasal PCR >>> MRSA  2/2 BCx >>> staph aureus, awaiting susceptibilities  2/2 BCx >>> negative  2/4 BCx x2 >>> NGTD  2/5 UCx >>> negative   ANTIBIOTICS:  2/2 Vanc >>>  2/2 Rifampin >>> 2/6  2/2 cefazolin >>> 2/3   HPI/Subjective: Sleepy no complains.  Objective: Filed Vitals:   11/13/13 1413 11/13/13 2105 11/14/13 0052 11/14/13 0641  BP: 168/69 157/57 138/50 163/60  Pulse: 65 65  73  Temp: 98.1 F (36.7 C) 99.1 F (37.3 C)  99.1 F (37.3 C)  TempSrc: Oral Oral  Oral  Resp: 20 18  20   Height:      Weight:    82.3 kg (181 lb 7 oz)  SpO2: 93% 99%  97%   No intake or output data in the 24 hours ending 11/14/13 1041 Filed Weights   11/12/13 0500 11/13/13 0500 11/14/13 0641  Weight: 79.6 kg (175 lb 7.8 oz) 79.5 kg (175 lb 4.3 oz) 82.3 kg (181 lb 7  oz)    Exam:  General: Alert, awake, oriented x3, in no acute distress.  HEENT: No bruits, no goiter. +JVD Heart: Regular rate and rhythm, without murmurs, rubs, gallops.  Lungs: Good air movement, crackle son right. Abdomen: Soft, nontender, nondistended, positive bowel sounds.     Data Reviewed: Basic Metabolic Panel:  Recent Labs Lab 11/10/13 0414 11/10/13 0500 11/10/13 1500 11/11/13 0423 11/12/13 0400 11/13/13 0800 11/14/13 0406  NA  --  132*  --  136* 135* 132* 133*  K  --  3.8  --  3.6* 3.5* 3.9 3.9  CL  --   101  --  101 99 96 97  CO2  --  16*  --  20 21 20 21   GLUCOSE  --  130*  --  135* 138* 80 164*  BUN  --  73*  --  75* 67* 57* 59*  CREATININE  --  3.27*  --  2.63* 1.97* 1.69* 1.76*  CALCIUM  --  7.8*  --  7.5* 7.6* 7.7* 7.6*  MG 2.3  --  2.1 2.2 2.3 2.3  --   PHOS 3.5  --  3.6 2.8 4.0 3.7  --    Liver Function Tests:  Recent Labs Lab 11/10/13 0500  AST 37  ALT <5  ALKPHOS 109  BILITOT 1.2  PROT 6.1  ALBUMIN 1.9*   No results found for this basename: LIPASE, AMYLASE,  in the last 168 hours No results found for this basename: AMMONIA,  in the last 168 hours CBC:  Recent Labs Lab 11/07/13 1420 11/08/13 0433 11/08/13 0719 11/09/13 1230 11/10/13 0500 11/12/13 0400 11/13/13 0800  WBC 24.4* 26.7*  --  19.8* 24.4* 20.7* 23.4*  NEUTROABS 20.3* 22.2*  --  17.8*  --   --   --   HGB 7.9* 8.0* 7.7* 8.8* 8.1* 7.4* 7.7*  HCT 22.7* 22.7* 21.5* 24.0* 22.6* 21.0* 22.5*  MCV 82.2 83.2  --  81.6 83.1 83.3 84.0  PLT 86* 88*  --  101* 134* 201 243   Cardiac Enzymes:  Recent Labs Lab 11/09/13 1230  TROPONINI 0.55*   BNP (last 3 results) No results found for this basename: PROBNP,  in the last 8760 hours CBG:  Recent Labs Lab 11/13/13 1212 11/13/13 1635 11/13/13 2132 11/14/13 0658 11/14/13 1029  GLUCAP 166* 149* 194* 131* 159*    Recent Results (from the past 240 hour(s))  URINE CULTURE     Status: None   Collection Time    11/04/13 11:50 AM      Result Value Range Status   Specimen Description URINE, CLEAN CATCH   Final   Special Requests NONE   Final   Culture  Setup Time     Final   Value: 11/04/2013 21:17     Performed at [facility]     Final   Value: 65,000 COLONIES/ML     Performed at [facility]   Culture     Final   Value: METHICILLIN RESISTANT STAPHYLOCOCCUS AUREUS     Note: RIFAMPIN AND GENTAMICIN SHOULD NOT BE USED AS SINGLE DRUGS FOR TREATMENT OF STAPH INFECTIONS. CRITICAL RESULT CALLED TO, READ BACK BY AND VERIFIED  WITH: JESSICA C@3 :05PM ON 11/06/13 BY DANTS     Performed at [facility]   Report Status 11/06/2013 FINAL   Final   Organism ID, Bacteria METHICILLIN RESISTANT STAPHYLOCOCCUS AUREUS   Final  STOOL CULTURE     Status: None  Collection Time    11/04/13  3:55 PM      Result Value Range Status   Specimen Description STOOL   Final   Special Requests NONE   Final   Culture     Final   Value: NO SALMONELLA, SHIGELLA, CAMPYLOBACTER, YERSINIA, OR E.COLI 0157:H7 ISOLATED     Performed at [facility]   Report Status 11/08/2013 FINAL   Final  CLOSTRIDIUM DIFFICILE BY PCR     Status: None   Collection Time    11/04/13  3:55 PM      Result Value Range Status   C difficile by pcr NEGATIVE  NEGATIVE Final  CULTURE, BLOOD (ROUTINE X 2)     Status: None   Collection Time    11/04/13  5:13 PM      Result Value Range Status   Specimen Description BLOOD LEFT HAND   Final   Special Requests BOTTLES DRAWN AEROBIC AND ANAEROBIC 10CC EACH   Final   Culture  Setup Time     Final   Value: 11/05/2013 20:50     Performed at [facility]   Culture     Final   Value: METHICILLIN RESISTANT STAPHYLOCOCCUS AUREUS     Note: RIFAMPIN AND GENTAMICIN SHOULD NOT BE USED AS SINGLE DRUGS FOR TREATMENT OF STAPH INFECTIONS. CRITICAL RESULT CALLED TO, READ BACK BY AND VERIFIED WITH: JESSICA CHILDRESS@0913  ON [id number] BY [facility]     Note: Gram Stain Report Called to,Read Back By and Verified With: HAWKINS M AT 0729A ON [id number] BY THOMPSON S Performed at [facility]     Performed at [facility]   Report Status 11/07/2013 FINAL   Final   Organism ID, Bacteria METHICILLIN RESISTANT STAPHYLOCOCCUS AUREUS   Final  CULTURE, BLOOD (ROUTINE X 2)     Status: None   Collection Time    11/04/13  5:19 PM      Result Value Range Status   Specimen Description BLOOD RIGHT ARM   Final   Special Requests BOTTLES DRAWN AEROBIC AND ANAEROBIC 10CC EACH   Final   Culture  Setup Time     Final   Value:  11/05/2013 20:50     Performed at [facility]   Culture     Final   Value: STAPHYLOCOCCUS AUREUS     Note: SUSCEPTIBILITIES PERFORMED ON PREVIOUS CULTURE WITHIN THE LAST 5 DAYS.     Note: Gram Stain Report Called to,Read Back By and Verified With: HAWKINS M AT 0729A ON [id number] BY THOMPSON S Performed at [facility]     Performed at [facility]   Report Status 11/07/2013 FINAL   Final  CULTURE, BLOOD (ROUTINE X 2)     Status: None   Collection Time    11/06/13 12:28 PM      Result Value Range Status   Specimen Description BLOOD RIGHT HAND   Final   Special Requests     Final   Value: BOTTLES DRAWN AEROBIC AND ANAEROBIC 8CC EACH BOTTLE   Culture NO GROWTH 5 DAYS   Final   Report Status 11/11/2013 FINAL   Final  MRSA PCR SCREENING     Status: Abnormal   Collection Time    11/06/13 12:30 PM      Result Value Range Status   MRSA by PCR POSITIVE (*) NEGATIVE Final   Comment:            The GeneXpert MRSA Assay (FDA  approved for NASAL specimens     only), is one component of a     comprehensive MRSA colonization     surveillance program. It is not     intended to diagnose MRSA     infection nor to guide or     monitor treatment for     MRSA infections.     RESULT CALLED TO, READ BACK BY AND VERIFIED WITH:     CRUISE,J. AT [id number] ON 11/06/2013 BY BAUGHAM,M.  CULTURE, BLOOD (ROUTINE X 2)     Status: None   Collection Time    11/06/13  1:30 PM      Result Value Range Status   Specimen Description BLOOD RIGHT HAND   Final   Special Requests     Final   Value: BOTTLES DRAWN AEROBIC AND ANAEROBIC AEB=6CC ANA=4CC   Culture  Setup Time     Final   Value: 11/08/2013 01:26     Performed at [facility]   Culture     Final   Value: STAPHYLOCOCCUS AUREUS     Note: SUSCEPTIBILITIES PERFORMED ON PREVIOUS CULTURE WITHIN THE LAST 5 DAYS.     Note: Performed at [facility] Gram Stain Report Called to,Read Back By and Verified With: [place]ati  ON 11/07/13 BY Tyrone Schimke M     Performed at [facility]   Report Status 11/09/2013 FINAL   Final  CULTURE, BLOOD (ROUTINE X 2)     Status: None   Collection Time    11/08/13  8:40 PM      Result Value Range Status   Specimen Description BLOOD PICC LINE   Final   Special Requests BOTTLES DRAWN AEROBIC AND ANAEROBIC 10CC   Final   Culture  Setup Time     Final   Value: 11/09/2013 00:27     Performed at [facility]   Culture     Final   Value:        BLOOD CULTURE RECEIVED NO GROWTH TO DATE CULTURE WILL BE HELD FOR 5 DAYS BEFORE ISSUING A FINAL NEGATIVE REPORT     Performed at [facility]   Report Status PENDING   Incomplete  CULTURE, BLOOD (ROUTINE X 2)     Status: None   Collection Time    11/08/13  8:45 PM      Result Value Range Status   Specimen Description BLOOD LEFT ARM   Final   Special Requests BOTTLES DRAWN AEROBIC AND ANAEROBIC 10CC   Final   Culture  Setup Time     Final   Value: 11/09/2013 00:26     Performed at [facility]   Culture     Final   Value:        BLOOD CULTURE RECEIVED NO GROWTH TO DATE CULTURE WILL BE HELD FOR 5 DAYS BEFORE ISSUING A FINAL NEGATIVE REPORT     Performed at [facility]   Report Status PENDING   Incomplete  URINE CULTURE     Status: None   Collection Time    11/09/13 12:06 PM      Result Value Range Status   Specimen Description URINE, CATHETERIZED   Final   Special Requests Normal   Final   Culture  Setup Time     Final   Value: 11/09/2013 13:57     Performed at [place]     Final   Value: NO GROWTH  Performed at [facility]     Final   Value: NO GROWTH     Performed at [facility]   Report Status 11/10/2013 FINAL   Final     Studies: No results found.  Scheduled Meds: . amLODipine  10 mg Oral Daily  . antiseptic oral rinse  15 mL Mouth Rinse QID  . hydrALAZINE  100 mg Oral QID  . insulin aspart  0-15 Units Subcutaneous TID WC   . insulin aspart  0-5 Units Subcutaneous QHS  . insulin glargine  15 Units Subcutaneous Daily  . pantoprazole  40 mg Oral QHS  . vancomycin  1,000 mg Intravenous Q24H   Continuous Infusions: . sodium chloride       Charlynne Cousins  [facility] Pager [phone]. If 8PM-8AM, please contact night-coverage at [email or web address], password [facility] 11/14/2013, 10:41 AM  LOS: 10 days

## 2013-11-14 NOTE — Evaluation (Signed)
Occupational Therapy Evaluation Patient Details Name: [name] MRN: [id number] DOB: 1946-09-07 Today's Date: 11/14/2013 Time: [id number] OT Time Calculation (min): 31 min  OT Assessment / Plan / Recommendation History of present illness Patient is a 68 yo female with history of DM 2, HTN, CHF (EF 20%), CAD, PAD, ICD, Depression who presented to the hospital for hyperglycemia and failure to thrive, also with fever. During initial hospitalization she had blood cultures drawn that revealed MRSA. She had her ICD removed today for concern for septic source and developed hypotension following the procedure. Pt with ARF and had to be intubated.   Clinical Impression   Pt demos decline in function with ADLs and ADL mobility safety with decreased strength, balance and endurance and would benefit from acute OT services to address impairments to increase level of function and safety    OT Assessment  Patient needs continued OT Services    Follow Up Recommendations  CIR    Barriers to Discharge Decreased caregiver support    Equipment Recommendations   TBD at next venue of care   Recommendations for Other Services    Frequency  Min 2X/week    Precautions / Restrictions Precautions Precautions: Fall Restrictions Weight Bearing Restrictions: No   Pertinent Vitals/Pain 8/10 R knee    ADL  Grooming: Performed;Wash/dry hands;Wash/dry face;Moderate assistance Where Assessed - Grooming: Unsupported standing Upper Body Bathing: Simulated;Supervision/safety;Set up Where Assessed - Upper Body Bathing: Unsupported sitting Lower Body Bathing: Simulated;Moderate assistance;Maximal assistance Upper Body Dressing: Performed;Supervision/safety;Set up Where Assessed - Upper Body Dressing: Unsupported sitting Lower Body Dressing: Performed;Maximal assistance Toilet Transfer: Performed;Maximal assistance Toilet Transfer Method: Sit to stand;Stand pivot Toilet Transfer Equipment: Bedside  commode Toileting - Clothing Manipulation and Hygiene: Performed;Maximal assistance Tub/Shower Transfer: Performed;Maximal assistance Tub/Shower Transfer Method: Ambulating Equipment Used: Gait belt;Rolling walker;Other (comment) (BSC) Transfers/Ambulation Related to ADLs: assist to [facility] RW, cues for hand placement    OT Diagnosis: Generalized weakness;Acute pain  OT Problem List: Decreased strength;Decreased activity tolerance;Impaired balance (sitting and/or standing) OT Treatment Interventions: Self-care/ADL training;Therapeutic exercise;Patient/family education;Neuromuscular education;Balance training;Therapeutic activities;DME and/or AE instruction   OT Goals(Current goals can be found in the care plan section) Acute Rehab OT Goals Patient Stated Goal: to get some rehab and then go home OT Goal Formulation: With patient Time For Goal Achievement: 11/21/13 Potential to Achieve Goals: Good ADL Goals Pt Will Perform Grooming: with min assist;with min guard assist;standing Pt Will Perform Lower Body Bathing: with mod assist;with min assist;sit to/from stand Pt Will Perform Lower Body Dressing: with mod assist;sit to/from stand Pt Will Transfer to Toilet: with mod assist;with min assist;bedside commode;grab bars;ambulating;regular height toilet Pt Will Perform Toileting - Clothing Manipulation and hygiene: with mod assist;with min assist;sitting/lateral leans;sit to/from stand  Visit Information  Last OT Received On: 11/14/13 Assistance Needed: +1 History of Present Illness: Patient is a 68 yo female with history of DM 2, HTN, CHF (EF 20%), CAD, PAD, ICD, Depression who presented to the hospital for hyperglycemia and failure to thrive, also with fever. During initial hospitalization she had blood cultures drawn that revealed MRSA. She had her ICD removed today for concern for septic source and developed hypotension following the procedure. Pt with ARF and had to be intubated.        Prior [place] expects to be discharged to:: Private residence Living Arrangements: Children Type of Home: House Home Access: Stairs to enter Technical brewer of Steps: 4 Entrance Stairs-Rails: Left;Right;Can reach both [place]: One  level Home Equipment: Kasandra Knudsen - quad;Walker - 4 wheels Prior Function Level of Independence: Independent with assistive device(s) Comments: Amb with cane. Communication Communication: No difficulties Dominant Hand: Right         Vision/Perception Vision - History Baseline Vision: Wears glasses only for reading Patient Visual Report: No change from baseline Perception Perception: Within Functional Limits   Cognition  Cognition Arousal/Alertness: Awake/alert Behavior During Therapy: WFL for tasks assessed/performed Overall Cognitive Status: Within Functional Limits for tasks assessed    Extremity/Trunk Assessment Upper Extremity Assessment Upper Extremity Assessment: Overall WFL for tasks assessed;Generalized weakness Lower Extremity Assessment Lower Extremity Assessment: Defer to PT evaluation LLE Coordination: decreased gross motor Cervical / Trunk Assessment Cervical / Trunk Assessment: Normal     Mobility Bed Mobility Overal bed mobility: Needs Assistance Bed Mobility: Supine to Sit Supine to sit: Mod assist General bed mobility comments: pt up in recliner Transfers Overall transfer level: Needs assistance Equipment used: Rolling walker (2 wheeled) Transfers: Sit to/from [facility] Sit to Stand: Max assist Stand pivot transfers: Max assist General transfer comment: assist to [employer], cues for hand placement          Balance Balance Overall balance assessment: No apparent balance deficits (not formally assessed) Sitting-balance support: No upper extremity supported;Feet supported Sitting balance-Leahy Scale: Good Standing balance support: Single extremity  supported;Bilateral upper extremity supported;During functional activity Standing balance-Leahy Scale: Poor General Comments General comments (skin integrity, edema, etc.): Pt with wide BOS.   End of Session OT - End of Session Equipment Utilized During Treatment: Gait belt;Rolling walker;Other (comment) (BSC) Activity Tolerance: Patient tolerated treatment well Patient left: in chair;with call bell/phone within reach  GO     Britt Bottom 11/14/2013, 3:11 PM

## 2013-11-14 NOTE — Evaluation (Signed)
Physical Therapy Evaluation Patient Details Name: [name] MRN: [id number] DOB: September 15, 1946 Today's Date: 11/14/2013 Time: [id number] PT Time Calculation (min): 31 min  PT Assessment / Plan / Recommendation History of Present Illness  Patient is a 68 yo female with history of DM 2, HTN, CHF (EF 20%), CAD, PAD, ICD, Depression who presented to the hospital for hyperglycemia and failure to thrive, also with fever. During initial hospitalization she had blood cultures drawn that revealed MRSA. She had her ICD removed today for concern for septic source and developed hypotension following the procedure. Pt with ARF and had to be intubated.  Clinical Impression  Pt is requiring +2 A at time of eval.  Pt was fairly high level prior to hospitalization per family and recommend CIR consult.  If pt is unable to go to CIR, recommend SNF.  Will follow pt acutely to address deficits and work towards her PLOF.    PT Assessment  Patient needs continued PT services    Follow Up Recommendations  CIR    Does the patient have the potential to tolerate intense rehabilitation      Barriers to Discharge Decreased caregiver support Lives with daughter who works during the day    Clinical biochemist with 5" wheels    Recommendations for Other Services Rehab consult   Frequency Min 3X/week    Precautions / Restrictions Precautions Precautions: Fall   Pertinent Vitals/Pain R knee pain with mobility.  Ice applied.      Mobility  Bed Mobility Overal bed mobility: Needs Assistance Bed Mobility: Supine to Sit Supine to sit: Mod assist Transfers Overall transfer level: Needs assistance Equipment used: Rolling walker (2 wheeled) Transfers: Sit to/from [employer] Transfers Sit to Stand: Max assist;+2 physical assistance Stand pivot transfers: Max assist;+2 physical assistance General transfer comment: Pt with wide BOS and difficulty with L LE coordination and  decreased WB through R LE due to R knee pain.  Unable to ambulate. Bed> BSC SPT; BSC> bed with use of RW; bed> recliner SPT    Exercises     PT Diagnosis: Difficulty walking;Generalized weakness  PT Problem List: Decreased strength;Decreased range of motion;Decreased balance;Decreased mobility;Decreased activity tolerance PT Treatment Interventions: Gait training;Functional mobility training;Therapeutic activities;Therapeutic exercise;DME instruction     PT Goals(Current goals can be found in the care plan section) Acute Rehab PT Goals Patient Stated Goal: Pt and son agreeable that pt needs more rehab before going home. PT Goal Formulation: With patient/family Time For Goal Achievement: 11/28/13 Potential to Achieve Goals: Good  Visit Information  Last PT Received On: 11/14/13 Assistance Needed: +2 History of Present Illness: Patient is a 68 yo female with history of DM 2, HTN, CHF (EF 20%), CAD, PAD, ICD, Depression who presented to the hospital for hyperglycemia and failure to thrive, also with fever. During initial hospitalization she had blood cultures drawn that revealed MRSA. She had her ICD removed today for concern for septic source and developed hypotension following the procedure. Pt with ARF and had to be intubated.       Prior [place] expects to be discharged to:: Private residence Living Arrangements: Children Type of Home: House Home Access: Stairs to enter Technical brewer of Steps: 4 Entrance Stairs-Rails: Left;Right;Can reach both Home Layout: One level [place] - 4 wheels Prior Function Level of Independence: Independent with assistive device(s) Comments: Amb with cane. Communication Communication: No difficulties    Cognition  Cognition Arousal/Alertness: Awake/alert Behavior  During Therapy: WFL for tasks assessed/performed Overall Cognitive Status: Within Functional Limits for tasks assessed     Extremity/Trunk Assessment Lower Extremity Assessment Lower Extremity Assessment: Generalized weakness LLE Coordination: decreased gross motor   Balance Balance Overall balance assessment: Needs assistance Standing balance-Leahy Scale: Poor General Comments General comments (skin integrity, edema, etc.): Pt with wide BOS.  End of Session PT - End of Session Equipment Utilized During Treatment: Gait belt Activity Tolerance: Patient limited by fatigue;Patient limited by pain Patient left: in chair;with family/visitor present Nurse Communication: Mobility status  GP     Caterra Ostroff LUBECK 11/14/2013, 12:57 PM

## 2013-11-15 DIAGNOSIS — I339 Acute and subacute endocarditis, unspecified: Secondary | ICD-10-CM

## 2013-11-15 DIAGNOSIS — R5381 Other malaise: Secondary | ICD-10-CM

## 2013-11-15 DIAGNOSIS — J9600 Acute respiratory failure, unspecified whether with hypoxia or hypercapnia: Secondary | ICD-10-CM

## 2013-11-15 LAB — CBC
HCT: 20.7 % — ABNORMAL LOW (ref 36.0–46.0)
Hemoglobin: 7.3 g/dL — ABNORMAL LOW (ref 12.0–15.0)
MCH: 29.8 pg (ref 26.0–34.0)
MCHC: 35.3 g/dL (ref 30.0–36.0)
MCV: 84.5 fL (ref 78.0–100.0)
PLATELETS: 234 10*3/uL (ref 150–400)
RBC: 2.45 MIL/uL — AB (ref 3.87–5.11)
RDW: 14.7 % (ref 11.5–15.5)
WBC: 14.9 10*3/uL — ABNORMAL HIGH (ref 4.0–10.5)

## 2013-11-15 LAB — GLUCOSE, CAPILLARY
Glucose-Capillary: 143 mg/dL — ABNORMAL HIGH (ref 70–99)
Glucose-Capillary: 157 mg/dL — ABNORMAL HIGH (ref 70–99)
Glucose-Capillary: 116 mg/dL — ABNORMAL HIGH (ref 70–99)
Glucose-Capillary: 147 mg/dL — ABNORMAL HIGH (ref 70–99)

## 2013-11-15 LAB — BASIC METABOLIC PANEL
BUN: 55 mg/dL — ABNORMAL HIGH (ref 6–23)
CALCIUM: 7.6 mg/dL — AB (ref 8.4–10.5)
CO2: 22 mEq/L (ref 19–32)
Chloride: 99 mEq/L (ref 96–112)
Creatinine, Ser: 1.91 mg/dL — ABNORMAL HIGH (ref 0.50–1.10)
GFR calc Af Amer: 30 mL/min — ABNORMAL LOW (ref >90)
GFR, EST NON AFRICAN AMERICAN: 26 mL/min — AB (ref >90)
Glucose, Bld: 157 mg/dL — ABNORMAL HIGH (ref 70–99)
POTASSIUM: 3.8 meq/L (ref 3.7–5.3)
Sodium: 136 mEq/L — ABNORMAL LOW (ref 137–147)

## 2013-11-15 LAB — CULTURE, BLOOD (ROUTINE X 2)
Culture: NO GROWTH
Culture: NO GROWTH

## 2013-11-15 MED ORDER — PANTOPRAZOLE SODIUM 40 MG PO TBEC
40.0000 mg | DELAYED_RELEASE_TABLET | Freq: Two times a day (BID) | ORAL | Status: DC
  Administered 2013-11-15 – 2013-11-20 (×10): 40 mg via ORAL
  Filled 2013-11-15 (×9): qty 1

## 2013-11-15 MED ORDER — GLUCERNA SHAKE PO LIQD: 237.0000 mL | Freq: Every day | ORAL | Status: DC | PRN

## 2013-11-15 NOTE — Progress Notes (Signed)
[facility] PROGRESS NOTE Interim History: 68 year old that came in to admit and further fevers, nonketotic hyperglycemic state was found to have MRSA on her urine and and 2 out of 2 blood cultures positive for MRSA, she also has a biventricular defibrillator in situ. Currently on an insulin drip and on Vanc and Rifampin. Infectious disease Dr. Graylon Good was consulted and recommended transfer her to come. Will come to the step down unit on an insulin drip and will probably need cardiology for TEE. Admitted to [facility] develop septic shock requiring pressors and intubated. Had removal of her ICD and wire.   Assessment/Plan: Acute respiratory failure likely related to septic emboli, uncompensated metabolic acidosis, Septic shock: - Intubated 2.5.2015 extubated on 2.6.2015. - breathing stable since  Septic shock due MRSA Bacteremia/Leukocytosis/endocarditis: - Requiring pressor on 2.5.2015-2.6.2015. - S/p ICD and wire removal, with SVC thrombus. - Started empirically on Vanc 2.2.2015. PICC line placed. - Mild elevation on trops likely related to hypotension ande demand ischemia. -will need IV antibiotics until March 24; appreciate ID assistance  Left first MTP ulceration on X-ray concern for osteomyelitis: - Cont vanc.  - ID rec holding on MRI; since might not change therapy  Nonischemic cardiomyopathy/Chronic systolic heart failure EF 45%: - resume coreg, d/c norvasc, cont hydralazine and add lasix  -no crackles on exam today; still with trace to 1 + LE edema - cont to monitor HR and SBP. - cont to add medications as tolerate for her HF. - appriciate cardiology assistance; plan is to add imdur  AKI on chronic kidney disease/diabetic nephropathyHyponatremia: - Due to septic shock - Improving with with treatment of shock - baseline Cr 1.6-1.8 -will monitor  Diabete II: - cont lantus and SSI.  Acute encephalopathy: -appears to be back to baseline; patient is now able to answer  questions.  GERD -will change protonix to BID  Physical deconditioning -CIR vs SNF  Code Status: full  Family Communication: No family at bedside  Disposition Plan: Pending improvement in condition and specialist recommendations; CIR vs SNF    Consultants:  ID  Cardiology  PCCM  SIGNIFICANT EVENTS / STUDIES:  2/1 admission to [facility] for [facility]cemia, on insulin drip  2/1 BCx with MRSA, started on vanc  2/2 ID c/s and rifampin and cefazolin added  2/3 TTE EF 45% global hypokinesis, moderate LVH, grade 1 diastolic dysfunction, shadowing at level of tricuspid valve (vegetation cannot be completely excluded).  2/4 [facility] from [facility]  2/5- removal wires, shock, ETT  2/5 pressors off  2/6 extubated   LINES / TUBES:  2/2 PICC >>>  2/5 ETT >>> 2/6  2/5 rt cordis>>>   CULTURES:  1/31 Stool Cx >>> no growth  1/31 BCx x2 >>> MRSA  1/31 UCx >>> MRSA  1/31 C diff >>> neg  2/2 Nasal PCR >>> MRSA  2/2 BCx >>> staph aureus, awaiting susceptibilities  2/2 BCx >>> negative  2/4 BCx x2 >>> NGTD  2/5 UCx >>> negative   ANTIBIOTICS:  2/2 Vanc >>>  2/2 Rifampin >>> 2/6  2/2 cefazolin >>> 2/3   HPI/Subjective: No CP, no SOB. Complaining of some GERD  Objective: Filed Vitals:   11/15/13 0701 11/15/13 0848 11/15/13 0900 11/15/13 1430  BP: 173/51 180/43 183/49 164/54  Pulse: 74  71 64  Temp: 100.1 F (37.8 C)  99.2 F (37.3 C) 99.7 F (37.6 C)  TempSrc: Oral  Oral Oral  Resp: 20  20 20   Height:      Weight:  82.2 kg (181 lb 3.5 oz)     SpO2: 99%  96% 98%    Intake/Output Summary (Last 24 hours) at 11/15/13 2137 Last data filed at 11/15/13 0900  Gross per 24 hour  Intake    120 ml  Output      0 ml  Net    120 ml   Filed Weights   11/13/13 0500 11/14/13 0641 11/15/13 0701  Weight: 79.5 kg (175 lb 4.3 oz) 82.3 kg (181 lb 7 oz) 82.2 kg (181 lb 3.5 oz)    Exam:  General: Alert, awake, oriented x3, in no acute distress.  HEENT: No bruits, no  goiter. +JVD Heart: Regular rate and rhythm, without murmurs, rubs, gallops.  Lungs: Good air movement, crackle son right. Abdomen: Soft, nontender, nondistended, positive bowel sounds.     Data Reviewed: Basic Metabolic Panel:  Recent Labs Lab 11/10/13 0414  11/10/13 1500 11/11/13 0423 11/12/13 0400 11/13/13 0800 11/14/13 0406 11/15/13 1145  NA  --   < >  --  136* 135* 132* 133* 136*  K  --   < >  --  3.6* 3.5* 3.9 3.9 3.8  CL  --   < >  --  101 99 96 97 99  CO2  --   < >  --  20 21 20 21 22   GLUCOSE  --   < >  --  135* 138* 80 164* 157*  BUN  --   < >  --  75* 67* 57* 59* 55*  CREATININE  --   < >  --  2.63* 1.97* 1.69* 1.76* 1.91*  CALCIUM  --   < >  --  7.5* 7.6* 7.7* 7.6* 7.6*  MG 2.3  --  2.1 2.2 2.3 2.3  --   --   PHOS 3.5  --  3.6 2.8 4.0 3.7  --   --   < > = values in this interval not displayed. Liver Function Tests:  Recent Labs Lab 11/10/13 0500  AST 37  ALT <5  ALKPHOS 109  BILITOT 1.2  PROT 6.1  ALBUMIN 1.9*   No results found for this basename: LIPASE, AMYLASE,  in the last 168 hours No results found for this basename: AMMONIA,  in the last 168 hours CBC:  Recent Labs Lab 11/09/13 1230 11/10/13 0500 11/12/13 0400 11/13/13 0800 11/15/13 1145  WBC 19.8* 24.4* 20.7* 23.4* 14.9*  NEUTROABS 17.8*  --   --   --   --   HGB 8.8* 8.1* 7.4* 7.7* 7.3*  HCT 24.0* 22.6* 21.0* 22.5* 20.7*  MCV 81.6 83.1 83.3 84.0 84.5  PLT 101* 134* 201 243 234   Cardiac Enzymes:  Recent Labs Lab 11/09/13 1230  TROPONINI 0.55*   BNP (last 3 results) No results found for this basename: PROBNP,  in the last 8760 hours CBG:  Recent Labs Lab 11/14/13 1625 11/14/13 2219 11/15/13 0658 11/15/13 1125 11/15/13 1621  GLUCAP 214* 161* 143* 157* 116*    Recent Results (from the past 240 hour(s))  CULTURE, BLOOD (ROUTINE X 2)     Status: None   Collection Time    11/06/13 12:28 PM      Result Value Ref Range Status   Specimen Description BLOOD RIGHT HAND    Final   Special Requests     Final   Value: BOTTLES DRAWN AEROBIC AND ANAEROBIC 8CC EACH BOTTLE   Culture NO GROWTH 5 DAYS   Final   Report Status 11/11/2013 FINAL  Final  MRSA PCR SCREENING     Status: Abnormal   Collection Time    11/06/13 12:30 PM      Result Value Ref Range Status   MRSA by PCR POSITIVE (*) NEGATIVE Final   Comment:            The GeneXpert MRSA Assay (FDA     approved for NASAL specimens     only), is one component of a     comprehensive MRSA colonization     surveillance program. It is not     intended to diagnose MRSA     infection nor to guide or     monitor treatment for     MRSA infections.     RESULT CALLED TO, READ BACK BY AND VERIFIED WITH:     CRUISE,J. AT [id number] ON 11/06/2013 BY BAUGHAM,M.  CULTURE, BLOOD (ROUTINE X 2)     Status: None   Collection Time    11/06/13  1:30 PM      Result Value Ref Range Status   Specimen Description BLOOD RIGHT HAND   Final   Special Requests     Final   Value: BOTTLES DRAWN AEROBIC AND ANAEROBIC AEB=6CC ANA=4CC   Culture  Setup Time     Final   Value: 11/08/2013 01:26     Performed at [facility]   Culture     Final   Value: STAPHYLOCOCCUS AUREUS     Note: SUSCEPTIBILITIES PERFORMED ON PREVIOUS CULTURE WITHIN THE LAST 5 DAYS.     Note: Performed at [facility] Gram Stain Report Called to,Read Back By and Verified With: [place] ON 11/07/13 BY Tyrone Schimke M     Performed at [facility]   Report Status 11/09/2013 FINAL   Final  CULTURE, BLOOD (ROUTINE X 2)     Status: None   Collection Time    11/08/13  8:40 PM      Result Value Ref Range Status   Specimen Description BLOOD PICC LINE   Final   Special Requests BOTTLES DRAWN AEROBIC AND ANAEROBIC 10CC   Final   Culture  Setup Time     Final   Value: 11/09/2013 00:27     Performed at [facility]   Culture     Final   Value: NO GROWTH 5 DAYS     Performed at [facility]   Report Status 11/15/2013 FINAL   Final   CULTURE, BLOOD (ROUTINE X 2)     Status: None   Collection Time    11/08/13  8:45 PM      Result Value Ref Range Status   Specimen Description BLOOD LEFT ARM   Final   Special Requests BOTTLES DRAWN AEROBIC AND ANAEROBIC 10CC   Final   Culture  Setup Time     Final   Value: 11/09/2013 00:26     Performed at [facility]   Culture     Final   Value: NO GROWTH 5 DAYS     Performed at [facility]   Report Status 11/15/2013 FINAL   Final  URINE CULTURE     Status: None   Collection Time    11/09/13 12:06 PM      Result Value Ref Range Status   Specimen Description URINE, CATHETERIZED   Final   Special Requests Normal   Final   Culture  Setup Time     Final   Value: 11/09/2013 13:57  Performed at [place]     Final   Value: NO GROWTH     Performed at [facility]   Culture     Final   Value: NO GROWTH     Performed at [facility]   Report Status 11/10/2013 FINAL   Final     Studies: No results found.  Scheduled Meds: . amLODipine  10 mg Oral Daily  . antiseptic oral rinse  15 mL Mouth Rinse QID  . carvedilol  12.5 mg Oral BID WC  . furosemide  40 mg Intravenous BID  . hydrALAZINE  100 mg Oral QID  . insulin aspart  0-15 Units Subcutaneous TID WC  . insulin aspart  0-5 Units Subcutaneous QHS  . insulin glargine  15 Units Subcutaneous Daily  . pantoprazole  40 mg Oral QHS  . vancomycin  1,000 mg Intravenous Q24H   Continuous Infusions: . sodium chloride      Time < 30 minutes  Zyanne Schumm  [facility] Pager [phone]. If 8PM-8AM, please contact night-coverage at [email or web address], password [facility] 11/15/2013, 9:37 PM  LOS: 11 days

## 2013-11-15 NOTE — Progress Notes (Addendum)
OT eval orders received and pt was evaluated on 11/14/13 and is currently on OT caseload, Thanks   [name], [place]

## 2013-11-15 NOTE — Consult Note (Signed)
Physical Medicine and Rehabilitation Consult  Reason for Consult:  Deconditioning  Referring Physician: Dr. Dyann Kief   HPI: [name] is a 68 y.o. female with a past medical history significant for diabetes, hypertension, non ischemic cardiomyopathy, and congestive heart failure status post CRTD implant in 09/2010. She has done well since her device implant and was last seen in follow up by Dr [name] in 09/2013. On 1/31 she was admitted with hyperglycemia, fever and failure to thrive to [facility].  Work up with MRSA bacteremia with endocarditis on tricuspid valve and defibrillator wires.  Chronic left MTP ulceration with concern for osteomyelitis.  She was transferred to [facility] on 11/08/13 for further treatment.  ICD extracted on 11/09/13 by Dr. Lovena Le and post op with hypotension as well as acute on chronic renal failure with metabolic acidosis with respiratory failure like related to septic emboli. Extubated on 11/10/13. .  Dr. Megan Salon consulted for input and recommended 4 week course of Vancomycin and rifampin discontinued.  Therapy evaluations done yesterday and CIR recommended.    Review of Systems  HENT: Negative for hearing loss.   Eyes: Negative for blurred vision and double vision.  Respiratory: Negative for cough and shortness of breath.   Cardiovascular: Negative for chest pain and palpitations.  Gastrointestinal: Negative for heartburn, nausea and abdominal pain.  Genitourinary: Positive for urgency and frequency.  Musculoskeletal: Positive for joint pain (right knee) and myalgias.       Balance problems  Neurological: Positive for headaches. Negative for dizziness.    Past Medical History  Diagnosis Date  . Diabetes mellitus, type 2   . Essential hypertension, benign   . Chronic systolic heart failure   . Coronary atherosclerosis of native coronary artery     Nonobstructive  . Nonischemic cardiomyopathy     LVEF 20%  . PAD (peripheral artery disease)    Left foot gangrene  . Anxiety   . Depression   . ICD (implantable cardiac defibrillator) in place     Past Surgical History  Procedure Laterality Date  . Abdominal hysterectomy    . Left shoulder surgery      Rotator Cuff  . Breast lumpectomy      Bil  . Cardiac defibrillator placement      Medtronic [id number]   . Exploratory laparotomy with lysis of adhesions    . Bladder surgery  2011 BENIGN MASS REMOVED FROM BLADDER  . Eye surgery  BILATERAL CATARACTS REMOVAL  10 YEARS AGO PER PATIENT  . Pr vein bypass graft,aorto-fem-pop  05-2011    Left popliteal- PTA graft   . Viterectomy  11/10/2012    OD   Dr Zigmund Daniel  . 25 gauge pars plana vitrectomy with 20 gauge mvr port for macular hole Right 11/10/2012    Procedure: 73 GAUGE PARS PLANA VITRECTOMY WITH 20 GAUGE MVR PORT FOR MACULAR HOLE;  Surgeon: Hayden Pedro, MD;  Location: [place];  Service: Ophthalmology;  Laterality: Right;  . Gas insertion Right 11/10/2012    Procedure: INSERTION OF GAS;  Surgeon: Hayden Pedro, MD;  Location: [place];  Service: Ophthalmology;  Laterality: Right;  C3F8  . Serum patch Right 11/10/2012    Procedure: SERUM PATCH;  Surgeon: Hayden Pedro, MD;  Location: [place];  Service: Ophthalmology;  Laterality: Right;  . 25 gauge pars plana vitrectomy with 20 gauge mvr port for macular hole Right 05/09/2013    Procedure: 25 GAUGE PARS PLANA VITRECTOMY WITH 20 GAUGE MVR PORT  FOR MACULAR HOLE;  Surgeon: Hayden Pedro, MD;  Location: [place];  Service: Ophthalmology;  Laterality: Right;  . Gas/fluid exchange Right 05/09/2013    Procedure: GAS/FLUID EXCHANGE;  Surgeon: Hayden Pedro, MD;  Location: [place];  Service: Ophthalmology;  Laterality: Right;  C3F8   . Serum patch Right 05/09/2013    Procedure: SERUM PATCH;  Surgeon: Hayden Pedro, MD;  Location: [place];  Service: Ophthalmology;  Laterality: Right;  . Laser photo ablation Right 05/09/2013    Procedure: LASER PHOTO ABLATION;  Surgeon: Hayden Pedro, MD;  Location: [place];   Service: Ophthalmology;  Laterality: Right;  Endolaser   . Membrane peel Right 05/09/2013    Procedure: MEMBRANE PEEL;  Surgeon: Hayden Pedro, MD;  Location: [place];  Service: Ophthalmology;  Laterality: Right;    Family History  Problem Relation Age of Onset  . Cirrhosis Father   . Cancer Mother     Ovarian  . Ovarian cancer Mother     Social History:  Lives with daughter. Retired Quarry manager.  Independent with cane PTA. She reports that she has never smoked. She has never used smokeless tobacco. She reports that she does not drink alcohol or use illicit drugs.   Allergies  Allergen Reactions  . Hydrocodone-Acetaminophen Other (See Comments)    hallucinations   Medications Prior to Admission  Medication Sig Dispense Refill  . amLODipine (NORVASC) 10 MG tablet Take 1 tablet (10 mg total) by mouth 2 (two) times daily.  60 tablet  6  . aspirin 81 MG tablet Take 81 mg by mouth daily.       . carvedilol (COREG) 25 MG tablet Take 1 tablet (25 mg total) by mouth 2 (two) times daily with a meal.  60 tablet  6  . digoxin (LANOXIN) 0.125 MG tablet Take 1 tablet (0.125 mg total) by mouth daily.  90 tablet  1  . docusate sodium (COLACE) 100 MG capsule Take 100 mg by mouth daily as needed. For constipation      . furosemide (LASIX) 40 MG tablet Take 40 mg by mouth daily.      . hydrALAZINE (APRESOLINE) 100 MG tablet Take 1 tablet (100 mg total) by mouth 3 (three) times daily.  90 tablet  5  . insulin glargine (LANTUS) 100 UNIT/ML injection Inject 45 Units into the skin daily.       . insulin lispro (HUMALOG) 100 UNIT/ML injection Inject 0-8 Units into the skin every evening. Sliding scale: 200-249=2 units, 250-299=4 units, 300-349=6 units, >350=8 units      . losartan (COZAAR) 50 MG tablet Take 1 tablet (50 mg total) by mouth 2 (two) times daily.  90 tablet  1  . nitroGLYCERIN (NITROSTAT) 0.4 MG SL tablet Place 0.4 mg under the tongue every 5 (five) minutes as needed. chest pain        Home: Home  Living Family/patient expects to be discharged to:: Private residence Living Arrangements: Children Type of Home: House Home Access: Stairs to enter Technical brewer of Steps: 4 Entrance Stairs-Rails: Left;Right;Can reach both [place]: One level [place] - quad;Walker - 4 wheels  Functional History: Prior Function Comments: Amb with cane. Functional Status:  Mobility:          ADL: ADL Grooming: Performed;Wash/dry hands;Wash/dry face;Moderate assistance Where Assessed - Grooming: Unsupported standing Upper Body Bathing: Simulated;Supervision/safety;Set up Where Assessed - Upper Body Bathing: Unsupported sitting Lower Body Bathing: Simulated;Moderate assistance;Maximal assistance Upper Body Dressing: Performed;Supervision/safety;Set up Where  Assessed - Upper Body Dressing: Unsupported sitting Lower Body Dressing: Performed;Maximal assistance Toilet Transfer: Performed;Maximal assistance Toilet Transfer Method: Sit to stand;Stand pivot Toilet Transfer Equipment: Bedside commode Tub/Shower Transfer: Performed;Maximal assistance Tub/Shower Transfer Method: Ambulating Equipment Used: Gait belt;Rolling walker;Other (comment) (BSC) Transfers/Ambulation Related to ADLs: assist to [facility] RW, cues for hand placement  Cognition: Cognition Overall Cognitive Status: Within Functional Limits for tasks assessed Orientation Level: Oriented X4 Cognition Arousal/Alertness: Awake/alert Behavior During Therapy: WFL for tasks assessed/performed Overall Cognitive Status: Within Functional Limits for tasks assessed  Blood pressure 183/49, pulse 71, temperature 99.2 F (37.3 C), temperature source Oral, resp. rate 20, height 5\' 3"  (1.6 m), weight 82.2 kg (181 lb 3.5 oz), SpO2 96.00%. Physical Exam  Nursing note and vitals reviewed. Constitutional: She is oriented to person, place, and time. She appears well-developed and well-nourished.  HENT:  Head: Normocephalic  and atraumatic.  Eyes: Conjunctivae are normal. Pupils are equal, round, and reactive to light.  Neck: Normal range of motion.  Cardiovascular: Normal rate and regular rhythm.   No murmur heard. Respiratory: Effort normal and breath sounds normal. No respiratory distress. She has no wheezes.  GI: Soft. Bowel sounds are normal. She exhibits no distension. There is no tenderness.  Musculoskeletal:  1+ edema left forearm and had. Stasis changes BLE. Left great toe ulcer with foam dressing-- + odor.  Right knee with decreased ROM due to stiffness and pain.   Neurological: She is alert and oriented to person, place, and time.  UE's 4/5 RHF 1+, LHF 2+, RKE 2, LKE 3+, ankles grossly 4/5. Fair insight and awareness. Normal language and communication.   Skin: Skin is warm and dry.  Psychiatric:  A little flat. Generally appropriate.    Results for orders placed during the hospital encounter of 11/04/13 (from the past 24 hour(s))  GLUCOSE, CAPILLARY     Status: Abnormal   Collection Time    11/14/13  4:25 PM      Result Value Ref Range   Glucose-Capillary 214 (*) 70 - 99 mg/dL   Comment 1 Notify RN    GLUCOSE, CAPILLARY     Status: Abnormal   Collection Time    11/14/13 10:19 PM      Result Value Ref Range   Glucose-Capillary 161 (*) 70 - 99 mg/dL  GLUCOSE, CAPILLARY     Status: Abnormal   Collection Time    11/15/13  6:58 AM      Result Value Ref Range   Glucose-Capillary 143 (*) 70 - 99 mg/dL  GLUCOSE, CAPILLARY     Status: Abnormal   Collection Time    11/15/13 11:25 AM      Result Value Ref Range   Glucose-Capillary 157 (*) 70 - 99 mg/dL   Comment 1 Documented in Chart     Comment 2 Notify RN    CBC     Status: Abnormal   Collection Time    11/15/13 11:45 AM      Result Value Ref Range   WBC 14.9 (*) 4.0 - 10.5 K/uL   RBC 2.45 (*) 3.87 - 5.11 MIL/uL   Hemoglobin 7.3 (*) 12.0 - 15.0 g/dL   HCT 20.7 (*) 36.0 - 46.0 %   MCV 84.5  78.0 - 100.0 fL   MCH 29.8  26.0 - 34.0 pg    MCHC 35.3  30.0 - 36.0 g/dL   RDW 14.7  11.5 - 15.5 %   Platelets 234  150 - 400 K/uL  BASIC METABOLIC PANEL  Status: Abnormal   Collection Time    11/15/13 11:45 AM      Result Value Ref Range   Sodium 136 (*) 137 - 147 mEq/L   Potassium 3.8  3.7 - 5.3 mEq/L   Chloride 99  96 - 112 mEq/L   CO2 22  19 - 32 mEq/L   Glucose, Bld 157 (*) 70 - 99 mg/dL   BUN 55 (*) 6 - 23 mg/dL   Creatinine, Ser 1.91 (*) 0.50 - 1.10 mg/dL   Calcium 7.6 (*) 8.4 - 10.5 mg/dL   GFR calc non Af Amer 26 (*) >90 mL/min   GFR calc Af Amer 30 (*) >90 mL/min   No results found.  Assessment/Plan: Diagnosis: deconditioning related to endocarditis, AVDRF, multiple medical issues 1. Does the need for close, 24 hr/day medical supervision in concert with the patient's rehab needs make it unreasonable for this patient to be served in a less intensive setting? Yes and Potentially 2. Co-Morbidities requiring supervision/potential complications: CAD, PAD, wound care, sepsis 3. Due to bladder management, bowel management, safety, skin/wound care, disease management, medication administration, pain management and patient education, does the patient require 24 hr/day rehab nursing? Yes and Potentially 4. Does the patient require coordinated care of a physician, rehab nurse, PT (1-2 hrs/day, 5 days/week) and OT (1-2 hrs/day, 5 days/week) to address physical and functional deficits in the context of the above medical diagnosis(es)? Yes and Potentially Addressing deficits in the following areas: balance, endurance, locomotion, strength, transferring, bowel/bladder control, bathing, dressing, feeding, grooming, toileting, cognition, speech, language, swallowing and psychosocial support 5. Can the patient actively participate in an intensive therapy program of at least 3 hrs of therapy per day at least 5 days per week? Potentially 6. The potential for patient to make measurable gains while on inpatient rehab is good 7. Anticipated  functional outcomes upon discharge from inpatient rehab are supervision to min assist with PT, supervision to min assist with OT, supervision to min assist with SLP. 8. Estimated rehab length of stay to reach the above functional goals is: 12-15 days 9. Does the patient have adequate social supports to accommodate these discharge functional goals? Potentially 10. Anticipated D/C setting: Home 11. Anticipated post D/C treatments: [place] therapy 12. Overall Rehab/Functional Prognosis: good  RECOMMENDATIONS: This patient's condition is appropriate for continued rehabilitative care in the following setting: potentially CIR Patient has agreed to participate in recommended program. Potentially Note that insurance prior authorization may be required for reimbursement for recommended care.  Comment: Pt seems to be unsure if she wants to participate in the 3+ hours of therapy per day on CIR. Furthermore, nurse and tech on 3E mention that she is resistant to do much for herself. If the patient displays cooperation with therapies and staff on 3E over the next 24 hours, I would be willing to bring her to rehab. I told the patient that i felt she could benefit from our program but that also she would have to participate and try her best with her therapy team and nurses while with [place]. Rehab Admissions Coordinator to follow up.  Thanks,  Meredith Staggers, MD, [facility]ody Drown     11/15/2013

## 2013-11-15 NOTE — Progress Notes (Signed)
Occupational Therapy Treatment Patient Details Name: [name] MRN: [id number] DOB: 1945-10-19 Today's Date: 11/15/2013 Time: [id number] OT Time Calculation (min): 24 min  OT Assessment / Plan / Recommendation  History of present illness Patient is a 68 yo female with history of DM 2, HTN, CHF (EF 20%), CAD, PAD, ICD, Depression who presented to the hospital for hyperglycemia and failure to thrive, also with fever. During initial hospitalization she had blood cultures drawn that revealed MRSA. She had her ICD removed today for concern for septic source and developed hypotension following the procedure. Pt with ARF and had to be intubated.   OT comments  Pt making progress with functional goals and should continue with acute OT services to increase level of function and safety. Pt refusing to ambulate to bathroom and wanting to use BSC instead. Pt required encouragement to participate in ADLs. Pt states that she has been calling for nurse tech to clean he up where she urinated in chair, however no call light was noted to be on upon entering room. Pt states that she has to wait up to 3-4 hrs for assist at times. During sit - stand pt had to complete hygiene from a BM that she did not make mention of at all during session. Pt's nurse tech present  during ADL and nurse tech stated that she was unaware pt could assist as much as she was doing with OT as pt tells nurse tech that she cannot do it. Spoke with pt's nurse and nurse tech about her lack of participation with nursing staff and they both confirmed that  pt does not call for assist when she has to urinate or have a BM, that she will wet and soil bed/chair instead and that they check on her several times during the day and she states that she is ok and doesn't need anything. OT reiterated to pt that she must fully participate as much as she possibly can with acute therapy and with nurse techs in her self care as she would have to on rehab after  acute care d/c in order to be able to return home at [facility]  Follow Up Recommendations  CIR    Barriers to Discharge   Pt lives at home alone    Equipment Recommendations  None recommended by OT    Recommendations for Other Services    Frequency Min 2X/week   Progress towards OT Goals Progress towards OT goals: Progressing toward goals  Plan Discharge plan remains appropriate    Precautions / Restrictions Precautions Precautions: Fall Restrictions Weight Bearing Restrictions: No   Pertinent Vitals/Pain No c/o pain    ADL  Grooming: Performed;Wash/dry hands;Wash/dry face;Minimal assistance Where Assessed - Grooming: Supported standing Lower Body Bathing: Performed;Moderate assistance Where Assessed - Lower Body Bathing: Supported standing Lower Body Dressing: Performed;Maximal assistance;Moderate assistance Toilet Transfer: Moderate assistance;Performed Toilet Transfer Method: Sit to Loss adjuster, chartered: Therapist, occupational and Hygiene: Performed;Moderate assistance Where Assessed - Toileting Clothing Manipulation and Hygiene: Standing Equipment Used: Gait belt;Rolling walker;Other (comment) (BSC) Transfers/Ambulation Related to ADLs: assist to [facility] RW, cues for hand placement ADL Comments: Pt required increased time to complete ADLs and ADL mobility destinations due to fatigue    OT Diagnosis:    OT Problem List:   OT Treatment Interventions:     OT Goals(current goals can now be found in the care plan section)    Visit Information  Last OT Received On: 11/15/13 Assistance Needed: +1 History of  Present Illness: Patient is a 68 yo female with history of DM 2, HTN, CHF (EF 20%), CAD, PAD, ICD, Depression who presented to the hospital for hyperglycemia and failure to thrive, also with fever. During initial hospitalization she had blood cultures drawn that revealed MRSA. She had her ICD removed today for concern for septic source  and developed hypotension following the procedure. Pt with ARF and had to be intubated.    Subjective Data      Prior Functioning       Cognition  Cognition Arousal/Alertness: Awake/alert Behavior During Therapy: WFL for tasks assessed/performed Overall Cognitive Status: Within Functional Limits for tasks assessed    Mobility  Bed Mobility General bed mobility comments: pt up in recliner Transfers Overall transfer level: Needs assistance Equipment used: Rolling walker (2 wheeled) Transfers: Sit to/from Stand Sit to Stand: Mod assist General transfer comment: assist to [facility] RW, cues for hand placement          Balance Balance Standing balance support: Single extremity supported;During functional activity Standing balance-Leahy Scale: Fair  End of Session OT - End of Session Equipment Utilized During Treatment: Gait belt;Rolling walker;Other (comment) (BSC) Activity Tolerance: Patient tolerated treatment well;Patient limited by fatigue Patient left: in chair;with call bell/phone within reach;with nursing/sitter in room  GO     Britt Bottom 11/15/2013, 2:57 PM

## 2013-11-15 NOTE — Progress Notes (Signed)
Patient: [name] Date of Encounter: 11/15/2013, 10:23 AM Admit date: 11/04/2013     Subjective  [name] has no complaints this AM. She denies CP or SOB.   Objective  Physical Exam: Vitals: BP 183/49  Pulse 71  Temp(Src) 99.2 F (37.3 C) (Oral)  Resp 20  Ht 5\' 3"  (1.6 m)  Wt 181 lb 3.5 oz (82.2 kg)  BMI 32.11 kg/m2  SpO2 96% General: Well developed 68 year old female in no acute distress. Neck: Supple. JVD not elevated. Lungs: Clear bilaterally to auscultation without wheezes, rales, or rhonchi. Breathing is unlabored. Heart: RRR S1 S2 without murmurs, rubs, or gallops.  Abdomen: Soft, non-distended. Extremities: No clubbing or cyanosis. LUE edema noted. No LE edema.   Neuro: Alert and oriented X 3. Moves all extremities spontaneously. No focal deficits. Skin: Explant site intact without bleeding or hematoma.  Intake/Output:  Intake/Output Summary (Last 24 hours) at 11/15/13 1023 Last data filed at 11/15/13 0900  Gross per 24 hour  Intake    600 ml  Output      0 ml  Net    600 ml    Inpatient Medications:  . amLODipine  10 mg Oral Daily  . antiseptic oral rinse  15 mL Mouth Rinse QID  . carvedilol  12.5 mg Oral BID WC  . furosemide  40 mg Intravenous BID  . hydrALAZINE  100 mg Oral QID  . insulin aspart  0-15 Units Subcutaneous TID WC  . insulin aspart  0-5 Units Subcutaneous QHS  . insulin glargine  15 Units Subcutaneous Daily  . pantoprazole  40 mg Oral QHS  . vancomycin  1,000 mg Intravenous Q24H   . sodium chloride      Labs:  Recent Labs  11/13/13 0800 11/14/13 0406  NA 132* 133*  K 3.9 3.9  CL 96 97  CO2 20 21  GLUCOSE 80 164*  BUN 57* 59*  CREATININE 1.69* 1.76*  CALCIUM 7.7* 7.6*  MG 2.3  --   PHOS 3.7  --     Recent Labs  11/13/13 0800  WBC 23.4*  HGB 7.7*  HCT 22.5*  MCV 84.0  PLT 243    Radiology/Studies: Dg Chest Port 1 View  11/10/2013   CLINICAL DATA:  Respiratory distress, intubated  EXAM: PORTABLE  CHEST - 1 VIEW  COMPARISON:  Portable exam 0709 hr compared to 11/09/2013  FINDINGS: Tip of endotracheal tube projects 2.1 cm above carinal.  Nasogastric tube extends into stomach.  Right arm PICC line tip projects over SVC above cavoatrial junction.  External pacing leads and EKG leads are superimposed with the chest.  Enlargement of cardiac silhouette with pulmonary vascular congestion.  Mild perihilar infiltrates bilaterally.  No gross pleural effusion or pneumothorax.  IMPRESSION: Enlargement of cardiac silhouette with pulmonary vascular congestion mild perihilar infiltrates question edema.   Electronically Signed   By: Lavonia Dana M.D.   On: 11/10/2013 08:03   Dg Abd Portable 1v  11/09/2013   CLINICAL DATA:  Orogastric tube placement.  EXAM: PORTABLE ABDOMEN - 1 VIEW  COMPARISON:  02/20/2011  FINDINGS: Nasogastric tube curls within the stomach, well positioned.  There is a femoral a placed right iliac vein introducer sheath.  Soft tissues are otherwise unremarkable.  Normal bowel gas pattern.  IMPRESSION: Nasogastric tube is well positioned curling within the stomach.   Electronically Signed   By: Lajean Manes M.D.   On: 11/09/2013 10:27    Telemetry: SR; no arrhythmias  Assessment and Plan  1. MRSA endocarditis  - continue abx per ID 2. ICD system extraction 11/09/2013 - 1.5 X 1.6 vegetation, subsequently embolized to lung  - with acute respiratory failure, now extubated and off pressors 3. Acute on chronic renal insufficiency  - BMET pending this AM 4. Non-ischemic CM, with EF improvement following BiV ICD implant (CRT responder)  - will follow volume status closely without CRT   - resume CHF meds - Coreg added back; will add back losartan pending serum Cr 5. MRSA pneumonia 6. Anemia - CBC pending this AM  Signed, EDMISTEN, BROOKE PA-C  Rather than add losartan will add imdur as african amercan

## 2013-11-15 NOTE — Progress Notes (Signed)
UR completed Dahlila Pfahler K. Trixie Maclaren, RN, BSN, [place], CCM  11/15/2013 5:31 PM

## 2013-11-15 NOTE — Progress Notes (Signed)
Patient ID: [name], female   DOB: Feb 23, 1946, 68 y.o.   MRN: [id number]         [place] for Infectious Disease    Date of Admission:  11/04/2013           Day 11 vancomycin Principal Problem:   MRSA bacteremia Active Problems:   Bacterial endocarditis   Biventricular implantable cardioverter-defibrillator in situ   HYPERLIPIDEMIA   CORONARY ATHEROSCLEROSIS NATIVE CORONARY ARTERY   Nonischemic cardiomyopathy   Chronic systolic heart failure   PAD (peripheral artery disease)   Atherosclerosis of native arteries of the extremities with intermittent claudication   Hyperglycemia   Type 2 diabetes mellitus   Normocytic anemia   DJD (degenerative joint disease)   Foot ulcer, left   Septic shock(785.52)   Acute respiratory failure with hypoxia   . amLODipine  10 mg Oral Daily  . antiseptic oral rinse  15 mL Mouth Rinse QID  . carvedilol  12.5 mg Oral BID WC  . furosemide  40 mg Intravenous BID  . hydrALAZINE  100 mg Oral QID  . insulin aspart  0-15 Units Subcutaneous TID WC  . insulin aspart  0-5 Units Subcutaneous QHS  . insulin glargine  15 Units Subcutaneous Daily  . pantoprazole  40 mg Oral QHS  . vancomycin  1,000 mg Intravenous Q24H    Subjective: She is feeling much better.  Past Medical History  Diagnosis Date  . Diabetes mellitus, type 2   . Essential hypertension, benign   . Chronic systolic heart failure   . Coronary atherosclerosis of native coronary artery     Nonobstructive  . Nonischemic cardiomyopathy     LVEF 20%  . PAD (peripheral artery disease)     Left foot gangrene  . Anxiety   . Depression   . ICD (implantable cardiac defibrillator) in place     History  Substance Use Topics  . Smoking status: Never Smoker   . Smokeless tobacco: Never Used     Comment: tobacco use-no  . Alcohol Use: No    Family History  Problem Relation Age of Onset  . Cirrhosis Father   . Cancer Mother     Ovarian  . Ovarian cancer Mother      Allergies  Allergen Reactions  . Hydrocodone-Acetaminophen Other (See Comments)    hallucinations    Objective: Temp:  [98.6 F (37 C)-100.1 F (37.8 C)] 99.2 F (37.3 C) (02/11 0900) Pulse Rate:  [66-74] 71 (02/11 0900) Resp:  [20] 20 (02/11 0900) BP: (141-183)/(43-61) 183/49 mmHg (02/11 0900) SpO2:  [96 %-99 %] 96 % (02/11 0900) Weight:  [82.2 kg (181 lb 3.5 oz)] 82.2 kg (181 lb 3.5 oz) (02/11 0701)  General: She is alert and in no distress sitting up. Skin: Right arm PICC site appears normal Lungs: Clear Cor: Regular S1-S2 with no murmur No change left foot ulcer  Lab Results Lab Results  Component Value Date   WBC 14.9* 11/15/2013   HGB 7.3* 11/15/2013   HCT 20.7* 11/15/2013   MCV 84.5 11/15/2013   PLT 234 11/15/2013    Lab Results  Component Value Date   CREATININE 1.91* 11/15/2013   BUN 55* 11/15/2013   NA 136* 11/15/2013   K 3.8 11/15/2013   CL 99 11/15/2013   CO2 22 11/15/2013    Lab Results  Component Value Date   ALT <5 11/10/2013   AST 37 11/10/2013   ALKPHOS 109 11/10/2013   BILITOT 1.2 11/10/2013  Microbiology: Recent Results (from the past 240 hour(s))  CULTURE, BLOOD (ROUTINE X 2)     Status: None   Collection Time    11/06/13 12:28 PM      Result Value Ref Range Status   Specimen Description BLOOD RIGHT HAND   Final   Special Requests     Final   Value: BOTTLES DRAWN AEROBIC AND ANAEROBIC 8CC EACH BOTTLE   Culture NO GROWTH 5 DAYS   Final   Report Status 11/11/2013 FINAL   Final  MRSA PCR SCREENING     Status: Abnormal   Collection Time    11/06/13 12:30 PM      Result Value Ref Range Status   MRSA by PCR POSITIVE (*) NEGATIVE Final   Comment:            The GeneXpert MRSA Assay (FDA     approved for NASAL specimens     only), is one component of a     comprehensive MRSA colonization     surveillance program. It is not     intended to diagnose MRSA     infection nor to guide or     monitor treatment for     MRSA infections.     RESULT  CALLED TO, READ BACK BY AND VERIFIED WITH:     CRUISE,J. AT [id number] ON 11/06/2013 BY BAUGHAM,M.  CULTURE, BLOOD (ROUTINE X 2)     Status: None   Collection Time    11/06/13  1:30 PM      Result Value Ref Range Status   Specimen Description BLOOD RIGHT HAND   Final   Special Requests     Final   Value: BOTTLES DRAWN AEROBIC AND ANAEROBIC AEB=6CC ANA=4CC   Culture  Setup Time     Final   Value: 11/08/2013 01:26     Performed at [facility]   Culture     Final   Value: STAPHYLOCOCCUS AUREUS     Note: SUSCEPTIBILITIES PERFORMED ON PREVIOUS CULTURE WITHIN THE LAST 5 DAYS.     Note: Performed at [facility] Gram Stain Report Called to,Read Back By and Verified With: Laurie ON 11/07/13 BY Tyrone Schimke M     Performed at [facility]   Report Status 11/09/2013 FINAL   Final  CULTURE, BLOOD (ROUTINE X 2)     Status: None   Collection Time    11/08/13  8:40 PM      Result Value Ref Range Status   Specimen Description BLOOD PICC LINE   Final   Special Requests BOTTLES DRAWN AEROBIC AND ANAEROBIC 10CC   Final   Culture  Setup Time     Final   Value: 11/09/2013 00:27     Performed at [facility]   Culture     Final   Value: NO GROWTH 5 DAYS     Performed at [facility]   Report Status 11/15/2013 FINAL   Final  CULTURE, BLOOD (ROUTINE X 2)     Status: None   Collection Time    11/08/13  8:45 PM      Result Value Ref Range Status   Specimen Description BLOOD LEFT ARM   Final   Special Requests BOTTLES DRAWN AEROBIC AND ANAEROBIC 10CC   Final   Culture  Setup Time     Final   Value: 11/09/2013 00:26     Performed at [facility]  Final   Value: NO GROWTH 5 DAYS     Performed at [facility]   Report Status 11/15/2013 FINAL   Final  URINE CULTURE     Status: None   Collection Time    11/09/13 12:06 PM      Result Value Ref Range Status   Specimen Description URINE, CATHETERIZED   Final   Special Requests Normal    Final   Culture  Setup Time     Final   Value: 11/09/2013 13:57     Performed at [place]     Final   Value: NO GROWTH     Performed at [facility]   Culture     Final   Value: NO GROWTH     Performed at [facility]   Report Status 11/10/2013 FINAL   Final    Studies/Results: No results found.  Assessment: She is improving on therapy for MRSA bacteremia and endocarditis involving her former ICD lead and tricuspid valves. She suffered septic pulmonary embolism during lead extraction but has no apparent sequelae to that. I would treat her as complicated MRSA bacteremia with 6 weeks of IV vancomycin extending through March 14.   Plan: 1. Continue IV vancomycin per protocol through March 14 2. I will arrange followup in our clinic in early March 3. I will some cough now but please call if I can be of further assistance while she is here  Michel Bickers, [place] [facility] [place] [phone] pager   [phone] cell 11/15/2013, 3:35 PM

## 2013-11-15 NOTE — Progress Notes (Signed)
Nutrition Follow-up  INTERVENTION: Encourage PO intake Provide Glucerna Shake once daily PRN  NUTRITION DIAGNOSIS: Inadequate oral intake related to inability to eat as evidenced by NPO status; ongoing- diet advanced but PO intake remains inadequate  Goal: Intake to meet >90% of estimated nutrition needs; unmet  Monitor:  TF tolerance/adequacy, weight trend, labs, vent status.   68 y.o. female  Admitting Dx: MRSA bacteremia  ASSESSMENT: Patient is a 68 yo female with history of DM 2, HTN, CHF (EF 20%), CAD, PAD, ICD, depression who presented to [place] on 1/31 for hyperglycemia and failure to thrive, also with fever. Admission blood cultures noted to be positive for MRSA. Patient was transferred to [facility] from [place] for further management. With her history of ICD in place cardiology was consulted for evaluation and proceeded to remove the ICD this morning. Following the removal of the ICD the patient became hypotensive believed to be due to septic emboli. Required intubation on 2/5.  Pt extubated and diet advanced to full liquids on 2/6. Diet advanced to heart healthy diet on 2/9. Pt reports having a good appetite but, due to reflux she has to eat small portions slowly. Pt reports eating about 50% of meals. Per nursing notes pt is eating 5-75% of meals. Pt states that her usual body weight is 160 lbs. Encouraged good PO intake during hospitalization. Pt is not interested in receiving nutritional supplements or snacks at this time but, is willing to try Glucerna Shakes if PO intake remains poor.  Height: Ht Readings from Last 1 Encounters:  11/09/13 5\' 3"  (1.6 m)    Weight: Wt Readings from Last 1 Encounters:  11/15/13 181 lb 3.5 oz (82.2 kg)  11/08/13  170 lb 10.2 oz (77.4 kg) 11/07/13 167 lb 1.7 oz (75.8 kg)  11/06/13  158 lb 11.7 oz (72 kg)  11/05/13  158 lb 15.2 oz (72.1 kg)  11/04/13  161 lb (73.029 kg)   Usual Body Weight: 160 lb  % Usual Body Weight:  113%  BMI:  28.5 (using admission weight of 161 lb)  Estimated Nutritional Needs: Kcal: 1700-1900 Protein: 80-90 gm Fluid: 1.7-1.9 L  Skin: stage 2 pressure ulcer on left foot; incision on left chest  Diet Order: Cardiac    Intake/Output Summary (Last 24 hours) at 11/15/13 1133 Last data filed at 11/15/13 0900  Gross per 24 hour  Intake    600 ml  Output      0 ml  Net    600 ml    Last BM: 2/9  Labs:   Recent Labs Lab 11/11/13 0423 11/12/13 0400 11/13/13 0800 11/14/13 0406  NA 136* 135* 132* 133*  K 3.6* 3.5* 3.9 3.9  CL 101 99 96 97  CO2 20 21 20 21   BUN 75* 67* 57* 59*  CREATININE 2.63* 1.97* 1.69* 1.76*  CALCIUM 7.5* 7.6* 7.7* 7.6*  MG 2.2 2.3 2.3  --   PHOS 2.8 4.0 3.7  --   GLUCOSE 135* 138* 80 164*    CBG (last 3)   Recent Labs  11/14/13 1625 11/14/13 2219 11/15/13 0658  GLUCAP 214* 161* 143*    Scheduled Meds: . amLODipine  10 mg Oral Daily  . antiseptic oral rinse  15 mL Mouth Rinse QID  . carvedilol  12.5 mg Oral BID WC  . furosemide  40 mg Intravenous BID  . hydrALAZINE  100 mg Oral QID  . insulin aspart  0-15 Units Subcutaneous TID WC  . insulin aspart  0-5 Units Subcutaneous QHS  . insulin glargine  15 Units Subcutaneous Daily  . pantoprazole  40 mg Oral QHS  . vancomycin  1,000 mg Intravenous Q24H    Continuous Infusions: . sodium chloride      Past Medical History  Diagnosis Date  . Diabetes mellitus, type 2   . Essential hypertension, benign   . Chronic systolic heart failure   . Coronary atherosclerosis of native coronary artery     Nonobstructive  . Nonischemic cardiomyopathy     LVEF 20%  . PAD (peripheral artery disease)     Left foot gangrene  . Anxiety   . Depression   . ICD (implantable cardiac defibrillator) in place     Past Surgical History  Procedure Laterality Date  . Abdominal hysterectomy    . Left shoulder surgery      Rotator Cuff  . Breast lumpectomy      Bil  . Cardiac defibrillator  placement      Medtronic [id number]   . Exploratory laparotomy with lysis of adhesions    . Bladder surgery  2011 BENIGN MASS REMOVED FROM BLADDER  . Eye surgery  BILATERAL CATARACTS REMOVAL  10 YEARS AGO PER PATIENT  . Pr vein bypass graft,aorto-fem-pop  05-2011    Left popliteal- PTA graft   . Viterectomy  11/10/2012    OD   Dr Zigmund Daniel  . 25 gauge pars plana vitrectomy with 20 gauge mvr port for macular hole Right 11/10/2012    Procedure: 59 GAUGE PARS PLANA VITRECTOMY WITH 20 GAUGE MVR PORT FOR MACULAR HOLE;  Surgeon: Hayden Pedro, MD;  Location: [place];  Service: Ophthalmology;  Laterality: Right;  . Gas insertion Right 11/10/2012    Procedure: INSERTION OF GAS;  Surgeon: Hayden Pedro, MD;  Location: [place];  Service: Ophthalmology;  Laterality: Right;  C3F8  . Serum patch Right 11/10/2012    Procedure: SERUM PATCH;  Surgeon: Hayden Pedro, MD;  Location: [place];  Service: Ophthalmology;  Laterality: Right;  . 25 gauge pars plana vitrectomy with 20 gauge mvr port for macular hole Right 05/09/2013    Procedure: 50 GAUGE PARS PLANA VITRECTOMY WITH 20 GAUGE MVR PORT FOR MACULAR HOLE;  Surgeon: Hayden Pedro, MD;  Location: [place];  Service: Ophthalmology;  Laterality: Right;  . Gas/fluid exchange Right 05/09/2013    Procedure: GAS/FLUID EXCHANGE;  Surgeon: Hayden Pedro, MD;  Location: [place];  Service: Ophthalmology;  Laterality: Right;  C3F8   . Serum patch Right 05/09/2013    Procedure: SERUM PATCH;  Surgeon: Hayden Pedro, MD;  Location: [place];  Service: Ophthalmology;  Laterality: Right;  . Laser photo ablation Right 05/09/2013    Procedure: LASER PHOTO ABLATION;  Surgeon: Hayden Pedro, MD;  Location: [place];  Service: Ophthalmology;  Laterality: Right;  Endolaser   . Membrane peel Right 05/09/2013    Procedure: MEMBRANE PEEL;  Surgeon: Hayden Pedro, MD;  Location: [place];  Service: Ophthalmology;  Laterality: Right;    Pryor Ochoa RD, LDN Inpatient Clinical Dietitian Pager:  [phone] After Hours Pager: [phone]

## 2013-11-16 ENCOUNTER — Inpatient Hospital Stay (HOSPITAL_COMMUNITY): Payer: Medicare Other

## 2013-11-16 DIAGNOSIS — I339 Acute and subacute endocarditis, unspecified: Secondary | ICD-10-CM

## 2013-11-16 DIAGNOSIS — J9600 Acute respiratory failure, unspecified whether with hypoxia or hypercapnia: Secondary | ICD-10-CM

## 2013-11-16 DIAGNOSIS — D649 Anemia, unspecified: Secondary | ICD-10-CM

## 2013-11-16 LAB — GLUCOSE, CAPILLARY
GLUCOSE-CAPILLARY: 175 mg/dL — AB (ref 70–99)
Glucose-Capillary: 96 mg/dL (ref 70–99)
Glucose-Capillary: 149 mg/dL — ABNORMAL HIGH (ref 70–99)
Glucose-Capillary: 103 mg/dL — ABNORMAL HIGH (ref 70–99)

## 2013-11-16 LAB — VANCOMYCIN, TROUGH: Vancomycin Tr: 29.2 ug/mL (ref 10.0–20.0)

## 2013-11-16 LAB — PREPARE RBC (CROSSMATCH)

## 2013-11-16 MED ORDER — ISOSORBIDE MONONITRATE ER 30 MG PO TB24
30.0000 mg | ORAL_TABLET | Freq: Every day | ORAL | Status: DC
  Administered 2013-11-17 – 2013-11-20 (×4): 30 mg via ORAL
  Filled 2013-11-16 (×4): qty 1

## 2013-11-16 NOTE — Progress Notes (Signed)
Report given to receiving RN. Patient in recliner sleeping. No signs or symptoms of distress.

## 2013-11-16 NOTE — Progress Notes (Signed)
Patient: [name] Date of Encounter: 11/16/2013, 12:26 PM Admit date: 11/04/2013     Subjective  [name] reports SOB x 2 hours, "since I worked with therapy." She denies CP.    Objective  Physical Exam: Vitals: BP 160/69  Pulse 71  Temp(Src) 98.4 F (36.9 C) (Oral)  Resp 22  Ht 5\' 3"  (1.6 m)  Wt 171 lb 11.8 oz (77.9 kg)  BMI 30.43 kg/m2  SpO2 91% General: Well developed 68 year old female who appears mildly tachypneic. Neck: Supple. Difficult to assess JVD although does not appear elevated. Lungs: Shallow inspiration but clear bilaterally to auscultation without wheezes, rales, or rhonchi.  Heart: RRR S1 S2 without murmurs, rubs, or gallops.  Abdomen: Soft, non-distended. Extremities: No clubbing or cyanosis. No LE edema.   Neuro: Alert and oriented X 3. Moves all extremities spontaneously. No focal deficits. Skin: Explant site intact without bleeding or hematoma.  Intake/Output:  Intake/Output Summary (Last 24 hours) at 11/16/13 1226 Last data filed at 11/16/13 0730  Gross per 24 hour  Intake    600 ml  Output      0 ml  Net    600 ml    Inpatient Medications:  . amLODipine  10 mg Oral Daily  . antiseptic oral rinse  15 mL Mouth Rinse QID  . carvedilol  12.5 mg Oral BID WC  . furosemide  40 mg Intravenous BID  . hydrALAZINE  100 mg Oral QID  . insulin aspart  0-15 Units Subcutaneous TID WC  . insulin aspart  0-5 Units Subcutaneous QHS  . insulin glargine  15 Units Subcutaneous Daily  . pantoprazole  40 mg Oral BID  . vancomycin  1,000 mg Intravenous Q24H   . sodium chloride      Labs:  Recent Labs  11/14/13 0406 11/15/13 1145  NA 133* 136*  K 3.9 3.8  CL 97 99  CO2 21 22  GLUCOSE 164* 157*  BUN 59* 55*  CREATININE 1.76* 1.91*  CALCIUM 7.6* 7.6*    Recent Labs  11/15/13 1145  WBC 14.9*  HGB 7.3*  HCT 20.7*  MCV 84.5  PLT 234    Radiology/Studies: Dg Chest Port 1 View 11/10/2013   FINDINGS: Tip of endotracheal tube  projects 2.1 cm above carinal.  Nasogastric tube extends into stomach.  Right arm PICC line tip projects over SVC above cavoatrial junction.  External pacing leads and EKG leads are superimposed with the chest.  Enlargement of cardiac silhouette with pulmonary vascular congestion.  Mild perihilar infiltrates bilaterally.  No gross pleural effusion or pneumothorax.   IMPRESSION: Enlargement of cardiac silhouette with pulmonary vascular congestion mild perihilar infiltrates question edema.    Electronically Signed   By: Lavonia Dana M.D.   On: 11/10/2013 08:03   Telemetry: SR; no arrhythmias   Assessment and Plan  1. Acute onset SOB this AM - anemic with Hgb 7.3 today; agree with transfusing (Hgb/Hct on admission 9.8/27.8) - on exam, no rales or LE edema but will order CXR now; continue IV lasix for now  2. MRSA endocarditis  - continue abx per ID 2. ICD system extraction 11/09/2013 - 1.5 X 1.6 vegetation, subsequently embolized to lung  - with acute respiratory failure, now extubated and off pressors 3. Acute on chronic renal insufficiency  4. Non-ischemic CM, with EF improvement following BiV ICD implant (CRT responder)  - will follow volume status closely without CRT   - resume CHF meds - Coreg added  back 5. Anemia - per primary team  Signed, EDMISTEN, Hunt Oris  EP Attending  Patient seen and examined. See my note as well. Agree with above. Hopefully she will feel better after her transfusion.   Mikle Bosworth.D.

## 2013-11-16 NOTE — Progress Notes (Signed)
Patient received 1 unit of blood. BP is 193/46. No signs or symptoms of distress or discomfort. No verbal complaints. BP remained elevated during blood transfusion. Scheduled meds given, along with PRN dose of BP med. Made MD aware of administered meds. Will continue to monitor patient for further changes in condition.

## 2013-11-16 NOTE — Progress Notes (Signed)
CRITICAL VALUE ALERT  Critical value received:  Vancomycin Trough 29.2  Date of notification:  11/16/13  Time of notification:  [id number]  Critical value read back:yes  Nurse who received alert:  Lenis Dickinson RN to Ollen Barges RN  MD notified (1st page):  [place]  Time of first page:  1440  MD notified (2nd page):  Time of second page:  Responding MD:  Dyann Kief  Time MD responded: 1440

## 2013-11-16 NOTE — Progress Notes (Signed)
Follow up BP is 152/74. Patient is in recliner eating supper. No verbal complaints.

## 2013-11-16 NOTE — Progress Notes (Signed)
ANTIBIOTIC CONSULT NOTE - FOLLOW UP  Pharmacy Consult:  Vancomycin Indication:  MRSA bacteremia + endocarditis + possible osteomyelitis  Allergies  Allergen Reactions  . Hydrocodone-Acetaminophen Other (See Comments)    hallucinations    Patient Measurements: Height: 5\' 3"  (160 cm) Weight: 171 lb 11.8 oz (77.9 kg) (bed scale) IBW/kg (Calculated) : 52.4  Vital Signs: Temp: 98.2 F (36.8 C) (02/12 1356) Temp src: Oral (02/12 1356) BP: 153/68 mmHg (02/12 1400) Pulse Rate: 71 (02/12 1356) Intake/Output from previous day: 02/11 0701 - 02/12 0700 In: 360 [P.O.:360] Out: -  Intake/Output from this shift: Total I/O In: 360 [P.O.:360] Out: -   Labs:  Recent Labs  11/14/13 0406 11/15/13 1145  WBC  --  14.9*  HGB  --  7.3*  PLT  --  234  CREATININE 1.76* 1.91*   Estimated Creatinine Clearance: 28.2 ml/min (by C-G formula based on Cr of 1.91).  Recent Labs  11/16/13 0930  VANCOTROUGH 29.2*     Microbiology: Recent Results (from the past 720 hour(s))  URINE CULTURE     Status: None   Collection Time    11/04/13 11:50 AM      Result Value Ref Range Status   Specimen Description URINE, CLEAN CATCH   Final   Special Requests NONE   Final   Culture  Setup Time     Final   Value: 11/04/2013 21:17     Performed at [facility]     Final   Value: 65,000 COLONIES/ML     Performed at [facility]   Culture     Final   Value: METHICILLIN RESISTANT STAPHYLOCOCCUS AUREUS     Note: RIFAMPIN AND GENTAMICIN SHOULD NOT BE USED AS SINGLE DRUGS FOR TREATMENT OF STAPH INFECTIONS. CRITICAL RESULT CALLED TO, READ BACK BY AND VERIFIED WITH: JESSICA C@3 :05PM ON 11/06/13 BY DANTS     Performed at [facility]   Report Status 11/06/2013 FINAL   Final   Organism ID, Bacteria METHICILLIN RESISTANT STAPHYLOCOCCUS AUREUS   Final  STOOL CULTURE     Status: None   Collection Time    11/04/13  3:55 PM      Result Value Ref Range Status   Specimen  Description STOOL   Final   Special Requests NONE   Final   Culture     Final   Value: NO SALMONELLA, SHIGELLA, CAMPYLOBACTER, YERSINIA, OR E.COLI 0157:H7 ISOLATED     Performed at [facility]   Report Status 11/08/2013 FINAL   Final  CLOSTRIDIUM DIFFICILE BY PCR     Status: None   Collection Time    11/04/13  3:55 PM      Result Value Ref Range Status   C difficile by pcr NEGATIVE  NEGATIVE Final  CULTURE, BLOOD (ROUTINE X 2)     Status: None   Collection Time    11/04/13  5:13 PM      Result Value Ref Range Status   Specimen Description BLOOD LEFT HAND   Final   Special Requests BOTTLES DRAWN AEROBIC AND ANAEROBIC 10CC EACH   Final   Culture  Setup Time     Final   Value: 11/05/2013 20:50     Performed at [facility]   Culture     Final   Value: METHICILLIN RESISTANT STAPHYLOCOCCUS AUREUS     Note: RIFAMPIN AND GENTAMICIN SHOULD NOT BE USED AS SINGLE DRUGS FOR TREATMENT OF STAPH INFECTIONS. CRITICAL RESULT  CALLED TO, READ BACK BY AND VERIFIED WITH: JESSICA CHILDRESS@0913  ON [id number] BY Desha     Note: Gram Stain Report Called to,Read Back By and Verified With: HAWKINS M AT 0729A ON [id number] BY THOMPSON S Performed at [facility]     Performed at [facility]   Report Status 11/07/2013 FINAL   Final   Organism ID, Bacteria METHICILLIN RESISTANT STAPHYLOCOCCUS AUREUS   Final  CULTURE, BLOOD (ROUTINE X 2)     Status: None   Collection Time    11/04/13  5:19 PM      Result Value Ref Range Status   Specimen Description BLOOD RIGHT ARM   Final   Special Requests BOTTLES DRAWN AEROBIC AND ANAEROBIC 10CC EACH   Final   Culture  Setup Time     Final   Value: 11/05/2013 20:50     Performed at [facility]   Culture     Final   Value: STAPHYLOCOCCUS AUREUS     Note: SUSCEPTIBILITIES PERFORMED ON PREVIOUS CULTURE WITHIN THE LAST 5 DAYS.     Note: Gram Stain Report Called to,Read Back By and Verified With: HAWKINS M AT 0729A ON [id number] BY THOMPSON S  Performed at [facility]     Performed at [facility]   Report Status 11/07/2013 FINAL   Final  CULTURE, BLOOD (ROUTINE X 2)     Status: None   Collection Time    11/06/13 12:28 PM      Result Value Ref Range Status   Specimen Description BLOOD RIGHT HAND   Final   Special Requests     Final   Value: BOTTLES DRAWN AEROBIC AND ANAEROBIC 8CC EACH BOTTLE   Culture NO GROWTH 5 DAYS   Final   Report Status 11/11/2013 FINAL   Final  MRSA PCR SCREENING     Status: Abnormal   Collection Time    11/06/13 12:30 PM      Result Value Ref Range Status   MRSA by PCR POSITIVE (*) NEGATIVE Final   Comment:            The GeneXpert MRSA Assay (FDA     approved for NASAL specimens     only), is one component of a     comprehensive MRSA colonization     surveillance program. It is not     intended to diagnose MRSA     infection nor to guide or     monitor treatment for     MRSA infections.     RESULT CALLED TO, READ BACK BY AND VERIFIED WITH:     CRUISE,J. AT [id number] ON 11/06/2013 BY BAUGHAM,M.  CULTURE, BLOOD (ROUTINE X 2)     Status: None   Collection Time    11/06/13  1:30 PM      Result Value Ref Range Status   Specimen Description BLOOD RIGHT HAND   Final   Special Requests     Final   Value: BOTTLES DRAWN AEROBIC AND ANAEROBIC AEB=6CC ANA=4CC   Culture  Setup Time     Final   Value: 11/08/2013 01:26     Performed at [facility]   Culture     Final   Value: STAPHYLOCOCCUS AUREUS     Note: SUSCEPTIBILITIES PERFORMED ON PREVIOUS CULTURE WITHIN THE LAST 5 DAYS.     Note: Performed at [facility] Gram Stain Report Called to,Read Back By and Verified With: [place] ON 11/07/13 BY  Elza Rafter     Performed at [facility]   Report Status 11/09/2013 FINAL   Final  CULTURE, BLOOD (ROUTINE X 2)     Status: None   Collection Time    11/08/13  8:40 PM      Result Value Ref Range Status   Specimen Description BLOOD PICC LINE   Final   Special  Requests BOTTLES DRAWN AEROBIC AND ANAEROBIC 10CC   Final   Culture  Setup Time     Final   Value: 11/09/2013 00:27     Performed at [facility]   Culture     Final   Value: NO GROWTH 5 DAYS     Performed at [facility]   Report Status 11/15/2013 FINAL   Final  CULTURE, BLOOD (ROUTINE X 2)     Status: None   Collection Time    11/08/13  8:45 PM      Result Value Ref Range Status   Specimen Description BLOOD LEFT ARM   Final   Special Requests BOTTLES DRAWN AEROBIC AND ANAEROBIC 10CC   Final   Culture  Setup Time     Final   Value: 11/09/2013 00:26     Performed at [facility]   Culture     Final   Value: NO GROWTH 5 DAYS     Performed at [facility]   Report Status 11/15/2013 FINAL   Final  URINE CULTURE     Status: None   Collection Time    11/09/13 12:06 PM      Result Value Ref Range Status   Specimen Description URINE, CATHETERIZED   Final   Special Requests Normal   Final   Culture  Setup Time     Final   Value: 11/09/2013 13:57     Performed at [place]     Final   Value: NO GROWTH     Performed at [facility]   Culture     Final   Value: NO GROWTH     Performed at [facility]   Report Status 11/10/2013 FINAL   Final     Assessment: 84 YOF admitted for DKA, fever, and FTT found to have MRSA bacteremia, septic endocarditis, and possible osteomyelitis of left first MTP.  Patient continues on vancomycin therapy. Plan is for 6 weeks of therapy, through March 14th.   Her SCr remains slightly above her baseline of 1.6-1.8, no UOP is recorded. Her vancomycin trough is supratherapeutic at 29.2. The dose was given before the trough resulted as the lab machines were broken and the sample was sent to [facility] for processing.  Vanc 2/1 >> Rifampin 2/2 >>2/6  2/5 Urine - negative 2/4 Blood - NGTD 2/2 Blood - MRSA x1 1/31 Stool - negative 1/31 Blood - MRSA x2 (sens vanc, bactrim, rifampin) 1/31  Urine - 65k MRSA 1/31 Cdiff PCR - positive  Goal of Therapy:  Vancomycin trough level 15-20 mcg/ml  Plan:  -Hold vancomycin for now (the order has been discontinued off the MAR until a new regimen is established) -Random vancomycin level with am labs -Monitor renal function  [facility], Pharm.D., BCPS Clinical Pharmacist Pager: [phone] 11/16/2013 2:30 PM

## 2013-11-16 NOTE — Progress Notes (Signed)
Rehab admissions - I met with pt and further explained the possible role of inpatient rehab. Pt stated that it is her preference to return home with home health services so she "can work at my own pace". Explained the benefits of acute rehab to increase her overall independence and strengthening as she lives home alone. Previous therapy notes indicate that pt is not demonstrating consistent mobility in the room and is dependent for peri care/ not ambulating in room. Pt acknowledged this level of activity but had no concerns about returning home with home health and support from her son.  At this time, will check back on pt's progress with therapy and encouraged pt participate well with therapies to prepare her for home. Will monitor case and told pt that inpatient rehab could still be an option if she was willing to participate.  Updated RN and will update social Development worker, community as well. Please call me with any questions. Thanks.  Nanetta Batty, PT Rehabilitation Admissions Coordinator [phone]

## 2013-11-16 NOTE — Progress Notes (Signed)
SATURATION QUALIFICATIONS: (This note is used to comply with regulatory documentation for home oxygen)  Patient Saturations on Room Air at Rest = 93%  Patient Saturations on Room Air while Ambulating = 85%  Patient Saturations on 2 Liters of oxygen while Ambulating = 93% Please briefly explain why patient needs home oxygen:Pt desats on RA with activity.  Will need home O2.  Thanks.  [facility] Acute Rehabilitation [phone] (pager)

## 2013-11-16 NOTE — Progress Notes (Signed)
[facility] PROGRESS NOTE Interim History: 68 year old that came in to admit and further fevers, nonketotic hyperglycemic state was found to have MRSA on her urine and and 2 out of 2 blood cultures positive for MRSA, she also has a biventricular defibrillator in situ. Currently on an insulin drip and on Vanc and Rifampin. Infectious disease Dr. Graylon Good was consulted and recommended transfer her to come. Will come to the step down unit on an insulin drip and will probably need cardiology for TEE. [facility]mitted to [facility] develop septic shock requiring pressors and intubated. Had removal of her ICD and wire.   Assessment/Plan: Acute respiratory failure likely related to septic emboli, uncompensated metabolic acidosis, Septic shock: - Intubated 2.5.2015 extubated on 2.6.2015. - labor breathing and mild hypoxia with activity today  -will check CXR -most likely due to anemia and exertion -better with O2 supplementation  Septic shock due MRSA Bacteremia/Leukocytosis/endocarditis: - Requiring pressor on 2.5.2015-2.6.2015. - S/p ICD and wire removal, with SVC thrombus. - Started empirically on Vanc 2.2.2015. PICC line placed. - Mild elevation on trops likely related to hypotension ande demand ischemia. -will need IV antibiotics until March 24; appreciate ID assistance  Left first MTP ulceration on X-ray concern for osteomyelitis: - Cont vanc.  - ID rec holding on MRI; since might not change therapy  Nonischemic cardiomyopathy/Chronic systolic heart failure EF 45%: - resume coreg, d/c norvasc, cont hydralazine and add lasix  -no crackles on exam today; still with trace to 1 + LE edema - cont to monitor HR and SBP. - cont to add medications as tolerate for her HF. - appriciate cardiology assistance; plan is to add imdur (which would also help BP)  AKI on chronic kidney disease/diabetic nephropathyHyponatremia: - Due to septic shock - Improving with with treatment of shock - baseline Cr  1.6-1.8 -will continue to monitor  Diabete II: - cont lantus and SSI.  Acute encephalopathy: -appears to be back to baseline; patient is now able to answer questions appropriately and follow commands.  GERD -will change protonix to BID  Physical deconditioning -CIR vs SNF  Anemia -symptomatic -Hgb 7.3 -will transfuse 2 units of PRBC's -follow Hgb trend -check FOBT  HTN -BP elevated -continue adjusting meds and follow response  Code Status: full  Family Communication: No family at bedside  Disposition Plan: Pending improvement in condition and specialist recommendations; CIR vs SNF    Consultants:  ID  Cardiology  PCCM  SIGNIFICANT EVENTS / STUDIES:  2/1 admission to [facility] for hyperglycemia, on insulin drip  2/1 BCx with MRSA, started on vanc  2/2 ID c/s and rifampin and cefazolin added  2/3 TTE EF 45% global hypokinesis, moderate LVH, grade 1 diastolic dysfunction, shadowing at level of tricuspid valve (vegetation cannot be completely excluded).  2/4 transferred to [facility] from [facility]  2/5- removal wires, shock, ETT  2/5 pressors off  2/6 extubated  2/12 2 units of PRBC's transfused  LINES / TUBES:  2/2 PICC >>>  2/5 ETT >>> 2/6  2/5 rt cordis>>>   CULTURES:  1/31 Stool Cx >>> no growth  1/31 BCx x2 >>> MRSA  1/31 UCx >>> MRSA  1/31 C diff >>> neg  2/2 Nasal PCR >>> MRSA  2/2 BCx >>> staph aureus, awaiting susceptibilities  2/2 BCx >>> negative  2/4 BCx x2 >>> NGTD  2/5 UCx >>> negative   ANTIBIOTICS:  2/2 Vanc >>>  2/2 Rifampin >>> 2/6  2/2 cefazolin >>> 2/3   HPI/Subjective: No CP, mild SOB and hypoxia  with PT session today. Patient Hgb also 7.3  Objective: Filed Vitals:   11/16/13 1947 11/16/13 2045 11/16/13 2104 11/16/13 2214  BP: 158/65 152/73 169/63 161/75  Pulse: 68 66 70 65  Temp: 99.6 F (37.6 C) 99.9 F (37.7 C) 99.7 F (37.6 C) 99.5 F (37.5 C)  TempSrc: Oral Oral Oral Oral  Resp: 22 20 24 22   Height:      Weight:       SpO2: 96% 98% 97% 98%    Intake/Output Summary (Last 24 hours) at 11/16/13 2254 Last data filed at 11/16/13 2045  Gross per 24 hour  Intake    910 ml  Output      0 ml  Net    910 ml   Filed Weights   11/14/13 0641 11/15/13 0701 11/16/13 0722  Weight: 82.3 kg (181 lb 7 oz) 82.2 kg (181 lb 3.5 oz) 77.9 kg (171 lb 11.8 oz)    Exam:  General: Alert, awake, oriented x3, in no acute distress after oxygen applied.  HEENT: No bruits, no goiter. Mild JVD Heart: Regular rate and rhythm, without murmurs, rubs, gallops.  Lungs: Good air movement, no frank crackle on exam Abdomen: Soft, nontender, nondistended, positive bowel sounds.     Data Reviewed: Basic Metabolic Panel:  Recent Labs Lab 11/10/13 0414  11/10/13 1500 11/11/13 0423 11/12/13 0400 11/13/13 0800 11/14/13 0406 11/15/13 1145  NA  --   < >  --  136* 135* 132* 133* 136*  K  --   < >  --  3.6* 3.5* 3.9 3.9 3.8  CL  --   < >  --  101 99 96 97 99  CO2  --   < >  --  20 21 20 21 22   GLUCOSE  --   < >  --  135* 138* 80 164* 157*  BUN  --   < >  --  75* 67* 57* 59* 55*  CREATININE  --   < >  --  2.63* 1.97* 1.69* 1.76* 1.91*  CALCIUM  --   < >  --  7.5* 7.6* 7.7* 7.6* 7.6*  MG 2.3  --  2.1 2.2 2.3 2.3  --   --   PHOS 3.5  --  3.6 2.8 4.0 3.7  --   --   < > = values in this interval not displayed. Liver Function Tests:  Recent Labs Lab 11/10/13 0500  AST 37  ALT <5  ALKPHOS 109  BILITOT 1.2  PROT 6.1  ALBUMIN 1.9*   No results found for this basename: LIPASE, AMYLASE,  in the last 168 hours No results found for this basename: AMMONIA,  in the last 168 hours CBC:  Recent Labs Lab 11/10/13 0500 11/12/13 0400 11/13/13 0800 11/15/13 1145  WBC 24.4* 20.7* 23.4* 14.9*  HGB 8.1* 7.4* 7.7* 7.3*  HCT 22.6* 21.0* 22.5* 20.7*  MCV 83.1 83.3 84.0 84.5  PLT 134* 201 243 234   Cardiac Enzymes: No results found for this basename: CKTOTAL, CKMB, CKMBINDEX, TROPONINI,  in the last 168 hours BNP (last 3  results) No results found for this basename: PROBNP,  in the last 8760 hours CBG:  Recent Labs Lab 11/15/13 2142 11/16/13 0704 11/16/13 1139 11/16/13 1647 11/16/13 2153  GLUCAP 147* 96 175* 149* 103*    Recent Results (from the past 240 hour(s))  CULTURE, BLOOD (ROUTINE X 2)     Status: None   Collection Time    11/08/13  8:40  PM      Result Value Ref Range Status   Specimen Description BLOOD PICC LINE   Final   Special Requests BOTTLES DRAWN AEROBIC AND ANAEROBIC 10CC   Final   Culture  Setup Time     Final   Value: 11/09/2013 00:27     Performed at [facility]   Culture     Final   Value: NO GROWTH 5 DAYS     Performed at [facility]   Report Status 11/15/2013 FINAL   Final  CULTURE, BLOOD (ROUTINE X 2)     Status: None   Collection Time    11/08/13  8:45 PM      Result Value Ref Range Status   Specimen Description BLOOD LEFT ARM   Final   Special Requests BOTTLES DRAWN AEROBIC AND ANAEROBIC 10CC   Final   Culture  Setup Time     Final   Value: 11/09/2013 00:26     Performed at [facility]   Culture     Final   Value: NO GROWTH 5 DAYS     Performed at [facility]   Report Status 11/15/2013 FINAL   Final  URINE CULTURE     Status: None   Collection Time    11/09/13 12:06 PM      Result Value Ref Range Status   Specimen Description URINE, CATHETERIZED   Final   Special Requests Normal   Final   Culture  Setup Time     Final   Value: 11/09/2013 13:57     Performed at [place]     Final   Value: NO GROWTH     Performed at [facility]   Culture     Final   Value: NO GROWTH     Performed at [facility]   Report Status 11/10/2013 FINAL   Final     Studies: No results found.  Scheduled Meds: . amLODipine  10 mg Oral Daily  . antiseptic oral rinse  15 mL Mouth Rinse QID  . carvedilol  12.5 mg Oral BID WC  . furosemide  40 mg Intravenous BID  . hydrALAZINE  100 mg Oral QID  .  insulin aspart  0-15 Units Subcutaneous TID WC  . insulin aspart  0-5 Units Subcutaneous QHS  . insulin glargine  15 Units Subcutaneous Daily  . pantoprazole  40 mg Oral BID   Continuous Infusions: . sodium chloride      Time < 30 minutes  Durene Dodge  [facility] Pager [phone]. If 8PM-8AM, please contact night-coverage at [email or web address], password [facility] 11/16/2013, 10:54 PM  LOS: 12 days

## 2013-11-16 NOTE — Progress Notes (Signed)
Physical Therapy Treatment Patient Details Name: [name] MRN: [id number] DOB: 10/02/1946 Today's Date: 11/16/2013 Time: [id number] PT Time Calculation (min): 32 min  PT Assessment / Plan / Recommendation  History of Present Illness Patient is a 68 yo female with history of DM 2, HTN, CHF (EF 20%), CAD, PAD, ICD, Depression who presented to the hospital for hyperglycemia and failure to thrive, also with fever. During initial hospitalization she had blood cultures drawn that revealed MRSA. She had her ICD removed today for concern for septic source and developed hypotension following the procedure. Pt with ARF and had to be intubated.   PT Comments   Pt admitted with above. Pt currently with functional limitations due to balance and endurance deficits. Pt told this PT that her daughter and son will care for her at home and that she wants to do HHPT.  Will need a 3N1 and gait belt and states she has a RW.  She was asking this PT about papers that her family could get to fill out to stay with her.  Told her that her daughter would probably have to talk to her employer re: paperwork to take off to take care of pt.  Pt MUST HAVE 24 HOUR CARE and pt aware.  She is unsafe and cannot ambulate or perform ADLs on her own.  Also desat on RA.   Pt will benefit from skilled PT to increase their independence and safety with mobility to allow discharge to the venue listed below.   Follow Up Recommendations  Home health PT;Supervision/Assistance - 24 hour (Pt states her daughter and son to provide 24 hour care.)                 Equipment Recommendations  Rolling walker with 5" wheels;3in1 (PT);Other (comment) (gait belt) ?home O2       Frequency Min 3X/week   Progress towards PT Goals Progress towards PT goals: Progressing toward goals  Plan Current plan remains appropriate    Precautions / Restrictions Precautions Precautions: Fall Restrictions Weight Bearing Restrictions: No   Pertinent  Vitals/Pain Desat to 85% on RA with activity.  No pain.      Mobility  Bed Mobility Overal bed mobility: Needs Assistance Bed Mobility: Supine to Sit Supine to sit: Min guard General bed mobility comments: Needed assist for elevation of trunk.   Transfers Overall transfer level: Needs assistance Equipment used: Rolling walker (2 wheeled) Transfers: Sit to/from Stand Sit to Stand: Min assist;Mod assist;+2 safety/equipment Stand pivot transfers: Min assist General transfer comment: assist to [facility] RW, cues for hand placement.  Assist and cues for safety as pt not being safe.  Pt lost her balance posteriorly while being cleaned of BM and needed Mod assist to steady.  Pt assisted to 3N1 as pt had stool on pad in bed.   Ambulation/Gait Ambulation/Gait assistance: Min assist;Mod assist;+2 safety/equipment Ambulation Distance (Feet): 45 Feet Assistive device: Rolling walker (2 wheeled) Gait Pattern/deviations: Step-through pattern;Decreased stride length;Decreased step length - right;Decreased step length - left;Wide base of support Gait velocity interpretation: <1.8 ft/sec, indicative of risk for recurrent falls General Gait Details: Pt taking shortened steps.  Pt needs max encouragement.  Pt needs standing rest breaks.  Did desat with activity as well.  Generally unsafe.     Exercises     PT Diagnosis:    PT Problem List:   PT Treatment Interventions:     PT Goals (current goals can now be found in the care plan section)  Visit Information  Last PT Received On: 11/16/13 Assistance Needed: +1 History of Present Illness: Patient is a 68 yo female with history of DM 2, HTN, CHF (EF 20%), CAD, PAD, ICD, Depression who presented to the hospital for hyperglycemia and failure to thrive, also with fever. During initial hospitalization she had blood cultures drawn that revealed MRSA. She had her ICD removed today for concern for septic source and developed hypotension following the  procedure. Pt with ARF and had to be intubated.    Subjective Data  Subjective: "I can go home with my family."   Cognition  Cognition Arousal/Alertness: Awake/alert Behavior During Therapy: WFL for tasks assessed/performed Overall Cognitive Status: Within Functional Limits for tasks assessed    Balance  Balance Overall balance assessment: Needs assistance;History of Falls Sitting-balance support: No upper extremity supported;Feet supported Sitting balance-Leahy Scale: Good Postural control: Posterior lean Standing balance support: Bilateral upper extremity supported;During functional activity Standing balance-Leahy Scale: Fair Standing balance comment: Needs UE support for stability.  Cannot maintain static standing without assist.   End of Session PT - End of Session Equipment Utilized During Treatment: Gait belt;Oxygen Activity Tolerance: Patient limited by fatigue;Patient limited by pain Patient left: in chair;with call bell/phone within reach Nurse Communication: Mobility status   GP     INGOLD,Hai Grabe 11/16/2013, 1:29 PM [facility] [phone] (pager)

## 2013-11-17 ENCOUNTER — Encounter (HOSPITAL_COMMUNITY): Payer: Self-pay | Admitting: Internal Medicine

## 2013-11-17 DIAGNOSIS — I10 Essential (primary) hypertension: Secondary | ICD-10-CM

## 2013-11-17 LAB — BASIC METABOLIC PANEL
BUN: 49 mg/dL — ABNORMAL HIGH (ref 6–23)
CHLORIDE: 99 meq/L (ref 96–112)
CO2: 23 mEq/L (ref 19–32)
Calcium: 7.8 mg/dL — ABNORMAL LOW (ref 8.4–10.5)
Creatinine, Ser: 2.02 mg/dL — ABNORMAL HIGH (ref 0.50–1.10)
GFR calc Af Amer: 28 mL/min — ABNORMAL LOW (ref >90)
GFR calc non Af Amer: 24 mL/min — ABNORMAL LOW (ref >90)
GLUCOSE: 102 mg/dL — AB (ref 70–99)
POTASSIUM: 3.7 meq/L (ref 3.7–5.3)
Sodium: 135 mEq/L — ABNORMAL LOW (ref 137–147)

## 2013-11-17 LAB — VANCOMYCIN, RANDOM: Vancomycin Rm: 38.1 ug/mL

## 2013-11-17 LAB — TYPE AND SCREEN
ABO/RH(D): B POS
Antibody Screen: NEGATIVE
Unit division: 0
Unit division: 0

## 2013-11-17 LAB — CBC
HEMATOCRIT: 25.1 % — AB (ref 36.0–46.0)
HEMOGLOBIN: 8.9 g/dL — AB (ref 12.0–15.0)
MCH: 29.7 pg (ref 26.0–34.0)
MCHC: 35.5 g/dL (ref 30.0–36.0)
MCV: 83.7 fL (ref 78.0–100.0)
Platelets: 246 10*3/uL (ref 150–400)
RBC: 3 MIL/uL — AB (ref 3.87–5.11)
RDW: 14.8 % (ref 11.5–15.5)
WBC: 14.9 10*3/uL — ABNORMAL HIGH (ref 4.0–10.5)

## 2013-11-17 LAB — GLUCOSE, CAPILLARY
GLUCOSE-CAPILLARY: 126 mg/dL — AB (ref 70–99)
Glucose-Capillary: 101 mg/dL — ABNORMAL HIGH (ref 70–99)
Glucose-Capillary: 135 mg/dL — ABNORMAL HIGH (ref 70–99)
Glucose-Capillary: 130 mg/dL — ABNORMAL HIGH (ref 70–99)

## 2013-11-17 MED ORDER — FUROSEMIDE 40 MG PO TABS
40.0000 mg | ORAL_TABLET | Freq: Two times a day (BID) | ORAL | Status: DC
  Administered 2013-11-17 – 2013-11-20 (×7): 40 mg via ORAL
  Filled 2013-11-17 (×8): qty 1

## 2013-11-17 MED ORDER — ENOXAPARIN SODIUM 40 MG/0.4ML ~~LOC~~ SOLN: 40.0000 mg | SUBCUTANEOUS | Status: DC

## 2013-11-17 MED ORDER — ENOXAPARIN SODIUM 30 MG/0.3ML ~~LOC~~ SOLN
30.0000 mg | SUBCUTANEOUS | Status: DC
  Administered 2013-11-17 – 2013-11-20 (×4): 30 mg via SUBCUTANEOUS
  Filled 2013-11-17 (×4): qty 0.3

## 2013-11-17 NOTE — Progress Notes (Signed)
[facility] PROGRESS NOTE Interim History: 68 year old that came in to admit and further fevers, nonketotic hyperglycemic state was found to have MRSA on her urine and and 2 out of 2 blood cultures positive for MRSA, she also has a biventricular defibrillator in situ. Currently on an insulin drip and on Vanc and Rifampin. Infectious disease Dr. Graylon Good was consulted and recommended transfer her to come. Will come to the step down unit on an insulin drip and will probably need cardiology for TEE. Admitted to [facility] develop septic shock requiring pressors and intubated. Had removal of her ICD and wire.   Assessment/Plan: Acute respiratory failure likely related to septic emboli, uncompensated metabolic acidosis, Septic shock: - Intubated 2.5.2015 extubated on 2.6.2015. - labor breathing and mild hypoxia with activity today  -will check CXR -most likely due to anemia and exertion -feeling better today -will repeat CXR in am -continue diuresis  Septic shock due MRSA Bacteremia/Leukocytosis/endocarditis: - Requiring pressor on 2.5.2015-2.6.2015. - S/p ICD and wire removal, with SVC thrombus. - Started empirically on Vanc 2.2.2015. PICC line placed. - Mild elevation on trops likely related to hypotension and  demand ischemia. -will need IV antibiotics until March 24; appreciate ID assistance -no fever  Left first MTP ulceration on X-ray concern for osteomyelitis: - Cont vanc.  - ID rec holding on MRI; since might not change therapy  Nonischemic cardiomyopathy/Chronic systolic heart failure EF 45%: - resume coreg, d/c norvasc, cont hydralazine and add lasix  -no crackles on exam today; still with trace to 1 + LE edema - cont to monitor HR and SBP. - cont to add medications as tolerate for her HF. - appriciate cardiology assistance; continue imdur (which would also help BP) -will transition to PO lasix in am  AKI on chronic kidney disease/diabetic nephropathyHyponatremia: - Due to  septic shock -SCr on 2/13 2.02 -diuresis IV might be also precipitate some worsening of renal function -baseline Cr 1.6-1.8 -will continue to monitor; BMET in am  Diabete II: - cont lantus and SSI.  Acute encephalopathy: -appears to be back to baseline; patient is now able to answer questions appropriately and follow commands.  GERD -will change protonix to BID  Physical deconditioning -CIR vs SNF  Anemia -symptomatic -Hgb 8.9 -after 2 units of PRBC's on 2/13 -follow Hgb trend -check FOBT  HTN -BP better now; will monitor  Code Status: full  Family Communication: No family at bedside  Disposition Plan: needs SNF; no family available 24/7 or with knowledge to deal with antibiotic therapy.   Consultants:  ID  Cardiology  PCCM  SIGNIFICANT EVENTS / STUDIES:  2/1 admission to [facility] for hyperglycemia, on insulin drip  2/1 BCx with MRSA, started on vanc  2/2 ID c/s and rifampin and cefazolin added  2/3 TTE EF 45% global hypokinesis, moderate LVH, grade 1 diastolic dysfunction, shadowing at level of tricuspid valve (vegetation cannot be completely excluded).  2/4 transferred to [facility] from [facility]  2/5- removal wires, shock, ETT  2/5 pressors off  2/6 extubated  2/12 2 units of PRBC's transfused  LINES / TUBES:  2/2 PICC >>>  2/5 ETT >>> 2/6  2/5 rt cordis>>>   CULTURES:  1/31 Stool Cx >>> no growth  1/31 BCx x2 >>> MRSA  1/31 UCx >>> MRSA  1/31 C diff >>> neg  2/2 Nasal PCR >>> MRSA  2/2 BCx >>> staph aureus, awaiting susceptibilities  2/2 BCx >>> negative  2/4 BCx x2 >>> NGTD  2/5 UCx >>> negative  ANTIBIOTICS:  2/2 Vanc >>>  2/2 Rifampin >>> 2/6  2/2 cefazolin >>> 2/3   HPI/Subjective: No CP, denies significant SOB. afebrile  Objective: Filed Vitals:   11/17/13 1011 11/17/13 1428 11/17/13 1429 11/17/13 1830  BP: 164/68 156/62 143/60 160/64  Pulse:   68   Temp:   99.2 F (37.3 C)   TempSrc:   Oral   Resp:   20   Height:      Weight:       SpO2:   98%     Intake/Output Summary (Last 24 hours) at 11/17/13 1912 Last data filed at 11/17/13 1730  Gross per 24 hour  Intake    970 ml  Output    750 ml  Net    220 ml   Filed Weights   11/15/13 0701 11/16/13 0722 11/17/13 0536  Weight: 82.2 kg (181 lb 3.5 oz) 77.9 kg (171 lb 11.8 oz) 80.3 kg (177 lb 0.5 oz)    Exam:  General: Alert, awake, oriented x3, in no acute distress after oxygen applied.  HEENT: No bruits, no goiter. no JVD Heart: Regular rate and rhythm, without murmurs, rubs, gallops. Trace edema bilaterally Lungs: Good air movement, no frank crackle on exam Abdomen: Soft, nontender, nondistended, positive bowel sounds.     Data Reviewed: Basic Metabolic Panel:  Recent Labs Lab 11/11/13 0423 11/12/13 0400 11/13/13 0800 11/14/13 0406 11/15/13 1145 11/17/13 0600  NA 136* 135* 132* 133* 136* 135*  K 3.6* 3.5* 3.9 3.9 3.8 3.7  CL 101 99 96 97 99 99  CO2 20 21 20 21 22 23   GLUCOSE 135* 138* 80 164* 157* 102*  BUN 75* 67* 57* 59* 55* 49*  CREATININE 2.63* 1.97* 1.69* 1.76* 1.91* 2.02*  CALCIUM 7.5* 7.6* 7.7* 7.6* 7.6* 7.8*  MG 2.2 2.3 2.3  --   --   --   PHOS 2.8 4.0 3.7  --   --   --    Liver Function Tests: No results found for this basename: AST, ALT, ALKPHOS, BILITOT, PROT, ALBUMIN,  in the last 168 hours No results found for this basename: LIPASE, AMYLASE,  in the last 168 hours No results found for this basename: AMMONIA,  in the last 168 hours CBC:  Recent Labs Lab 11/12/13 0400 11/13/13 0800 11/15/13 1145 11/17/13 0200  WBC 20.7* 23.4* 14.9* 14.9*  HGB 7.4* 7.7* 7.3* 8.9*  HCT 21.0* 22.5* 20.7* 25.1*  MCV 83.3 84.0 84.5 83.7  PLT 201 243 234 246   Cardiac Enzymes: No results found for this basename: CKTOTAL, CKMB, CKMBINDEX, TROPONINI,  in the last 168 hours BNP (last 3 results) No results found for this basename: PROBNP,  in the last 8760 hours CBG:  Recent Labs Lab 11/16/13 1647 11/16/13 2153 11/17/13 0704 11/17/13 1115  11/17/13 1635  GLUCAP 149* 103* 101* 135* 126*    Recent Results (from the past 240 hour(s))  CULTURE, BLOOD (ROUTINE X 2)     Status: None   Collection Time    11/08/13  8:40 PM      Result Value Ref Range Status   Specimen Description BLOOD PICC LINE   Final   Special Requests BOTTLES DRAWN AEROBIC AND ANAEROBIC 10CC   Final   Culture  Setup Time     Final   Value: 11/09/2013 00:27     Performed at [facility]   Culture     Final   Value: NO GROWTH 5 DAYS     Performed at  [facility]   Report Status 11/15/2013 FINAL   Final  CULTURE, BLOOD (ROUTINE X 2)     Status: None   Collection Time    11/08/13  8:45 PM      Result Value Ref Range Status   Specimen Description BLOOD LEFT ARM   Final   Special Requests BOTTLES DRAWN AEROBIC AND ANAEROBIC 10CC   Final   Culture  Setup Time     Final   Value: 11/09/2013 00:26     Performed at [facility]   Culture     Final   Value: NO GROWTH 5 DAYS     Performed at [facility]   Report Status 11/15/2013 FINAL   Final  URINE CULTURE     Status: None   Collection Time    11/09/13 12:06 PM      Result Value Ref Range Status   Specimen Description URINE, CATHETERIZED   Final   Special Requests Normal   Final   Culture  Setup Time     Final   Value: 11/09/2013 13:57     Performed at [place]     Final   Value: NO GROWTH     Performed at [facility]   Culture     Final   Value: NO GROWTH     Performed at [facility]   Report Status 11/10/2013 FINAL   Final     Studies: No results found.  Scheduled Meds: . amLODipine  10 mg Oral Daily  . antiseptic oral rinse  15 mL Mouth Rinse QID  . carvedilol  12.5 mg Oral BID WC  . enoxaparin (LOVENOX) injection  30 mg Subcutaneous Q24H  . furosemide  40 mg Intravenous BID  . hydrALAZINE  100 mg Oral QID  . insulin aspart  0-15 Units Subcutaneous TID WC  . insulin aspart  0-5 Units Subcutaneous QHS  .  insulin glargine  15 Units Subcutaneous Daily  . isosorbide mononitrate  30 mg Oral Daily  . pantoprazole  40 mg Oral BID   Continuous Infusions: . sodium chloride      Time < 30 minutes  Jahden Schara  [facility] Pager [phone]. If 8PM-8AM, please contact night-coverage at [email or web address], password [facility] 11/17/2013, 7:12 PM  LOS: 13 days

## 2013-11-17 NOTE — Progress Notes (Signed)
ANTIBIOTIC CONSULT NOTE - FOLLOW UP  Pharmacy Consult:  Vancomycin Indication:  MRSA bacteremia + endocarditis + possible osteomyelitis  Allergies  Allergen Reactions  . Hydrocodone-Acetaminophen Other (See Comments)    hallucinations   Patient Measurements: Height: 5\' 3"  (160 cm) Weight: 177 lb 0.5 oz (80.3 kg) (Scale C) IBW/kg (Calculated) : 52.4  Vital Signs: Temp: 98.2 F (36.8 C) (02/13 0536) Temp src: Oral (02/13 0536) BP: 164/68 mmHg (02/13 1011) Pulse Rate: 68 (02/13 0536) Intake/Output from previous day: 02/12 0701 - 02/13 0700 In: 910 [P.O.:360; I.V.:250; Blood:300] Out: -  Intake/Output from this shift: Total I/O In: 240 [P.O.:240] Out: 750 [Urine:750]  Labs:  Recent Labs  11/15/13 1145 11/17/13 0200 11/17/13 0600  WBC 14.9* 14.9*  --   HGB 7.3* 8.9*  --   PLT 234 246  --   CREATININE 1.91*  --  2.02*   Estimated Creatinine Clearance: 27.1 ml/min (by C-G formula based on Cr of 2.02).  Recent Labs  11/16/13 0930 11/17/13 0600  VANCOTROUGH 29.2*  --   VANCORANDOM  --  38.1    Microbiology: Recent Results (from the past 720 hour(s))  URINE CULTURE     Status: None   Collection Time    11/04/13 11:50 AM      Result Value Ref Range Status   Specimen Description URINE, CLEAN CATCH   Final   Special Requests NONE   Final   Culture  Setup Time     Final   Value: 11/04/2013 21:17     Performed at [facility]     Final   Value: 65,000 COLONIES/ML     Performed at [facility]   Culture     Final   Value: METHICILLIN RESISTANT STAPHYLOCOCCUS AUREUS     Note: RIFAMPIN AND GENTAMICIN SHOULD NOT BE USED AS SINGLE DRUGS FOR TREATMENT OF STAPH INFECTIONS. CRITICAL RESULT CALLED TO, READ BACK BY AND VERIFIED WITH: JESSICA C@3 :05PM ON 11/06/13 BY DANTS     Performed at [facility]   Report Status 11/06/2013 FINAL   Final   Organism ID, Bacteria METHICILLIN RESISTANT STAPHYLOCOCCUS AUREUS   Final  STOOL CULTURE      Status: None   Collection Time    11/04/13  3:55 PM      Result Value Ref Range Status   Specimen Description STOOL   Final   Special Requests NONE   Final   Culture     Final   Value: NO SALMONELLA, SHIGELLA, CAMPYLOBACTER, YERSINIA, OR E.COLI 0157:H7 ISOLATED     Performed at [facility]   Report Status 11/08/2013 FINAL   Final  CLOSTRIDIUM DIFFICILE BY PCR     Status: None   Collection Time    11/04/13  3:55 PM      Result Value Ref Range Status   C difficile by pcr NEGATIVE  NEGATIVE Final  CULTURE, BLOOD (ROUTINE X 2)     Status: None   Collection Time    11/04/13  5:13 PM      Result Value Ref Range Status   Specimen Description BLOOD LEFT HAND   Final   Special Requests BOTTLES DRAWN AEROBIC AND ANAEROBIC 10CC EACH   Final   Culture  Setup Time     Final   Value: 11/05/2013 20:50     Performed at [facility]   Culture     Final   Value: [place]  Note: RIFAMPIN AND GENTAMICIN SHOULD NOT BE USED AS SINGLE DRUGS FOR TREATMENT OF STAPH INFECTIONS. CRITICAL RESULT CALLED TO, READ BACK BY AND VERIFIED WITH: JESSICA CHILDRESS@0913  ON [id number] BY [facility]     Note: Gram Stain Report Called to,Read Back By and Verified With: HAWKINS M AT 0729A ON [id number] BY THOMPSON S Performed at [facility]     Performed at [facility]   Report Status 11/07/2013 FINAL   Final   Organism ID, Bacteria METHICILLIN RESISTANT STAPHYLOCOCCUS AUREUS   Final  CULTURE, BLOOD (ROUTINE X 2)     Status: None   Collection Time    11/04/13  5:19 PM      Result Value Ref Range Status   Specimen Description BLOOD RIGHT ARM   Final   Special Requests BOTTLES DRAWN AEROBIC AND ANAEROBIC 10CC EACH   Final   Culture  Setup Time     Final   Value: 11/05/2013 20:50     Performed at [facility]   Culture     Final   Value: STAPHYLOCOCCUS AUREUS     Note: SUSCEPTIBILITIES PERFORMED ON PREVIOUS CULTURE WITHIN THE LAST 5 DAYS.     Note:  Gram Stain Report Called to,Read Back By and Verified With: HAWKINS M AT 0729A ON [id number] BY THOMPSON S Performed at [facility]     Performed at [facility]   Report Status 11/07/2013 FINAL   Final  CULTURE, BLOOD (ROUTINE X 2)     Status: None   Collection Time    11/06/13 12:28 PM      Result Value Ref Range Status   Specimen Description BLOOD RIGHT HAND   Final   Special Requests     Final   Value: BOTTLES DRAWN AEROBIC AND ANAEROBIC 8CC EACH BOTTLE   Culture NO GROWTH 5 DAYS   Final   Report Status 11/11/2013 FINAL   Final  MRSA PCR SCREENING     Status: Abnormal   Collection Time    11/06/13 12:30 PM      Result Value Ref Range Status   MRSA by PCR POSITIVE (*) NEGATIVE Final   Comment:            The GeneXpert MRSA Assay (FDA     approved for NASAL specimens     only), is one component of a     comprehensive MRSA colonization     surveillance program. It is not     intended to diagnose MRSA     infection nor to guide or     monitor treatment for     MRSA infections.     RESULT CALLED TO, READ BACK BY AND VERIFIED WITH:     CRUISE,J. AT [id number] ON 11/06/2013 BY BAUGHAM,M.  CULTURE, BLOOD (ROUTINE X 2)     Status: None   Collection Time    11/06/13  1:30 PM      Result Value Ref Range Status   Specimen Description BLOOD RIGHT HAND   Final   Special Requests     Final   Value: BOTTLES DRAWN AEROBIC AND ANAEROBIC AEB=6CC ANA=4CC   Culture  Setup Time     Final   Value: 11/08/2013 01:26     Performed at [facility]   Culture     Final   Value: STAPHYLOCOCCUS AUREUS     Note: SUSCEPTIBILITIES PERFORMED ON PREVIOUS CULTURE WITHIN THE LAST 5 DAYS.     Note: Performed at [facility]  Hospital Gram Stain Report Called to,Read Back By and Verified With: [employer] [id number] ON 11/07/13 BY Tyrone Schimke M     Performed at [facility]   Report Status 11/09/2013 FINAL   Final  CULTURE, BLOOD (ROUTINE X 2)     Status: None   Collection Time    11/08/13  8:40  PM      Result Value Ref Range Status   Specimen Description BLOOD PICC LINE   Final   Special Requests BOTTLES DRAWN AEROBIC AND ANAEROBIC 10CC   Final   Culture  Setup Time     Final   Value: 11/09/2013 00:27     Performed at [facility]   Culture     Final   Value: NO GROWTH 5 DAYS     Performed at [facility]   Report Status 11/15/2013 FINAL   Final  CULTURE, BLOOD (ROUTINE X 2)     Status: None   Collection Time    11/08/13  8:45 PM      Result Value Ref Range Status   Specimen Description BLOOD LEFT ARM   Final   Special Requests BOTTLES DRAWN AEROBIC AND ANAEROBIC 10CC   Final   Culture  Setup Time     Final   Value: 11/09/2013 00:26     Performed at [facility]   Culture     Final   Value: NO GROWTH 5 DAYS     Performed at [facility]   Report Status 11/15/2013 FINAL   Final  URINE CULTURE     Status: None   Collection Time    11/09/13 12:06 PM      Result Value Ref Range Status   Specimen Description URINE, CATHETERIZED   Final   Special Requests Normal   Final   Culture  Setup Time     Final   Value: 11/09/2013 13:57     Performed at [place]     Final   Value: NO GROWTH     Performed at [facility]   Culture     Final   Value: NO GROWTH     Performed at [facility]   Report Status 11/10/2013 FINAL   Final     Assessment: 28 YOF admitted for DKA, fever, and FTT found to have MRSA bacteremia, septic endocarditis, and possible osteomyelitis of left first MTP.  Patient continues on vancomycin therapy. Plan is for 6 weeks of therapy, through March 14th.   Her SCr remains slightly above her baseline of 1.6-1.8, no UOP is recorded. Her vancomycin trough was supratherapeutic at 29.2 and her random level this morning remains elevated indicating inappropriate clearance.    Vanc 2/1 >> Rifampin 2/2 >>2/6  2/5 Urine - negative 2/4 Blood - NGTD 2/2 Blood - MRSA x1 1/31 Stool -  negative 1/31 Blood - MRSA x2 (sens vanc, bactrim, rifampin) 1/31 Urine - 65k MRSA 1/31 Cdiff PCR - positive  Goal of Therapy:  Vancomycin trough level 15-20 mcg/ml  Plan:  -Hold vancomycin for now (the order has been discontinued off the MAR until a new regimen is established) -Random vancomycin level with am labs -Monitor renal function Rober Minion, PharmD., MS Clinical Pharmacist Pager:  [phone] Thank you for allowing pharmacy to be part of this patients care team. 11/17/2013 1:49 PM

## 2013-11-17 NOTE — Progress Notes (Signed)
Patient ID: [id number] FILIPIAK, female   DOB: August 28, 1946, 68 y.o.   MRN: [id number]   Patient Name: [name] Date of Encounter: 11/17/2013     Principal Problem:   MRSA bacteremia Active Problems:   HYPERLIPIDEMIA   CORONARY ATHEROSCLEROSIS NATIVE CORONARY ARTERY   Nonischemic cardiomyopathy   Chronic systolic heart failure   PAD (peripheral artery disease)   Biventricular implantable cardioverter-defibrillator in situ   Atherosclerosis of native arteries of the extremities with intermittent claudication   Hyperglycemia   Type 2 diabetes mellitus   Normocytic anemia   DJD (degenerative joint disease)   Foot ulcer, left   Septic shock(785.52)   Acute respiratory failure with hypoxia   Bacterial endocarditis    SUBJECTIVE  "Pretty good", no chest pain or sob.  CURRENT MEDS . amLODipine  10 mg Oral Daily  . antiseptic oral rinse  15 mL Mouth Rinse QID  . carvedilol  12.5 mg Oral BID WC  . enoxaparin  40 mg Subcutaneous Q24H  . furosemide  40 mg Intravenous BID  . hydrALAZINE  100 mg Oral QID  . insulin aspart  0-15 Units Subcutaneous TID WC  . insulin aspart  0-5 Units Subcutaneous QHS  . insulin glargine  15 Units Subcutaneous Daily  . isosorbide mononitrate  30 mg Oral Daily  . pantoprazole  40 mg Oral BID    OBJECTIVE  Filed Vitals:   11/16/13 2104 11/16/13 2214 11/16/13 2320 11/17/13 0536  BP: 169/63 161/75 159/67 162/90  Pulse: 70 65 65 68  Temp: 99.7 F (37.6 C) 99.5 F (37.5 C) 99.3 F (37.4 C) 98.2 F (36.8 C)  TempSrc: Oral Oral Oral Oral  Resp: 24 22 22 22   Height:      Weight:    177 lb 0.5 oz (80.3 kg)  SpO2: 97% 98% 100% 100%    Intake/Output Summary (Last 24 hours) at 11/17/13 0724 Last data filed at 11/16/13 2045  Gross per 24 hour  Intake    910 ml  Output      0 ml  Net    910 ml   Filed Weights   11/15/13 0701 11/16/13 0722 11/17/13 0536  Weight: 181 lb 3.5 oz (82.2 kg) 171 lb 11.8 oz (77.9 kg) 177 lb 0.5 oz (80.3 kg)     PHYSICAL EXAM  General: Pleasant, NAD. Neuro: Alert and oriented X 3. Moves all extremities spontaneously. Psych: Normal affect. HEENT:  Normal  Neck: Supple without bruits or JVD. Lungs:  Resp regular and unlabored, CTA. Heart: RRR no s3, s4, or murmurs. Abdomen: Soft, non-tender, non-distended, BS + x 4.  Extremities: No clubbing, cyanosis or edema. DP/PT/Radials 2+ and equal bilaterally.  Accessory Clinical Findings  CBC  Recent Labs  11/15/13 1145 11/17/13 0200  WBC 14.9* 14.9*  HGB 7.3* 8.9*  HCT 20.7* 25.1*  MCV 84.5 83.7  PLT 234 0000000   Basic Metabolic Panel  Recent Labs  11/15/13 1145 11/17/13 0600  NA 136* 135*  K 3.8 3.7  CL 99 99  CO2 22 23  GLUCOSE 157* 102*  BUN 55* 49*  CREATININE 1.91* 2.02*  CALCIUM 7.6* 7.8*   Liver Function Tests No results found for this basename: AST, ALT, ALKPHOS, BILITOT, PROT, ALBUMIN,  in the last 72 hours No results found for this basename: LIPASE, AMYLASE,  in the last 72 hours Cardiac Enzymes No results found for this basename: CKTOTAL, CKMB, CKMBINDEX, TROPONINI,  in the last 72 hours BNP No components found with this  basename: POCBNP,  D-Dimer No results found for this basename: DDIMER,  in the last 72 hours Hemoglobin A1C No results found for this basename: HGBA1C,  in the last 72 hours Fasting Lipid Panel No results found for this basename: CHOL, HDL, LDLCALC, TRIG, CHOLHDL, LDLDIRECT,  in the last 72 hours Thyroid Function Tests No results found for this basename: TSH, T4TOTAL, FREET3, T3FREE, THYROIDAB,  in the last 72 hours  TELE  nsr   Radiology/Studies  Dg Chest 2 View  11/04/2013   CLINICAL DATA:  Weakness  EXAM: CHEST - 2 VIEW  COMPARISON:  05/09/2013  FINDINGS: Stable cardiomegaly. Lungs clear. Stable left subclavian AICD. Orthopedic anchor in the right humeral head. . No effusion.  IMPRESSION: Stable cardiomegaly and postop change.  No acute disease.   Electronically Signed   By: Arne Cleveland M.D.   On: 11/04/2013 09:43   Dg Chest Port 1 View  11/10/2013   CLINICAL DATA:  Respiratory distress, intubated  EXAM: PORTABLE CHEST - 1 VIEW  COMPARISON:  Portable exam 0709 hr compared to 11/09/2013  FINDINGS: Tip of endotracheal tube projects 2.1 cm above carinal.  Nasogastric tube extends into stomach.  Right arm PICC line tip projects over SVC above cavoatrial junction.  External pacing leads and EKG leads are superimposed with the chest.  Enlargement of cardiac silhouette with pulmonary vascular congestion.  Mild perihilar infiltrates bilaterally.  No gross pleural effusion or pneumothorax.  IMPRESSION: Enlargement of cardiac silhouette with pulmonary vascular congestion mild perihilar infiltrates question edema.   Electronically Signed   By: Lavonia Dana M.D.   On: 11/10/2013 08:03   Dg Chest Port 1 View  11/09/2013   CLINICAL DATA:  Hypoxia  EXAM: PORTABLE CHEST - 1 VIEW  COMPARISON:  November 06, 2013  FINDINGS: Endotracheal tube tip is 3.2 cm above the carina. Central catheter tip is in the superior vena cava near the junction with the right atrium. Nasogastric tube is coiled in the stomach. Pacemaker no longer appreciable. No pneumothorax.  There is no edema or consolidation. Heart is moderately enlarged with normal pulmonary vascularity, stable. No adenopathy.  IMPRESSION: Tube and catheter positions as described without pneumothorax. Stable cardiac enlargement. No edema or consolidation.   Electronically Signed   By: Lowella Grip M.D.   On: 11/09/2013 10:21   Dg Chest Port 1 View  11/06/2013   CLINICAL DATA:  Status post PICC line placement  EXAM: PORTABLE CHEST - 1 VIEW  COMPARISON:  11/04/2013  FINDINGS: Cardiac shadow is stable. A defibrillator is again seen. A new right-sided PICC line is noted with the catheter tip in the mid right atrium. The lungs are clear. No acute bony abnormality is noted.  IMPRESSION: New right PICC line with the tip in the mid right atrium.    Electronically Signed   By: Inez Catalina M.D.   On: 11/06/2013 15:02   Dg Knee Right Port  11/07/2013   CLINICAL DATA:  Pain and swelling  EXAM: PORTABLE RIGHT KNEE - 1-2 VIEW  COMPARISON:  None.  FINDINGS: Frontal and lateral views were obtained. There is a small joint effusion. No fracture or dislocation. There is mild generalized joint space narrowing. No erosive change or bony destruction. There is extensive arterial vascular calcification.  IMPRESSION: Multifocal osteoarthritic change. Small joint effusion. No fracture or bony destruction. There is extensive arterial vascular calcification.   Electronically Signed   By: Lowella Grip M.D.   On: 11/07/2013 08:29   Dg Abd Portable  1v  11/09/2013   CLINICAL DATA:  Orogastric tube placement.  EXAM: PORTABLE ABDOMEN - 1 VIEW  COMPARISON:  02/20/2011  FINDINGS: Nasogastric tube curls within the stomach, well positioned.  There is a femoral a placed right iliac vein introducer sheath.  Soft tissues are otherwise unremarkable.  Normal bowel gas pattern.  IMPRESSION: Nasogastric tube is well positioned curling within the stomach.   Electronically Signed   By: Lajean Manes M.D.   On: 11/09/2013 10:27   Dg Foot 2 Views Left  11/08/2013   CLINICAL DATA:  Chronic ulcer  EXAM: LEFT FOOT - 2 VIEW  COMPARISON:  None.  FINDINGS: There is a skin defect medial to the head of the first metatarsal. There is cortical demineralization in the adjacent head of the first metatarsal. No definite involvement of the subchondral cortex or MTP joint. There is a hallux valgus deformity. Negative for fracture. Otherwise normal mineralization and alignment. Small calcaneal spur at the plantar aponeurosis.  IMPRESSION: 1. Demineralization in the lateral aspect head first metatarsal, with an overlying soft tissue defect, suggesting osteomyelitis.   Electronically Signed   By: Arne Cleveland M.D.   On: 11/08/2013 21:46    ASSESSMENT AND PLAN  1. MRSA Bacterial endocarditis due to  an infected ICD system 2. Acute on chronic stage 3 renal failure, stable 3. Anemia - s/p transfusion 4. Chronic systolic heart failure - continue meds. Watch for overdiuresis. Consider cxr. Ordered yesterday but not in computer. 5. Debilitation - continue PT. She will likely need SNF 6. DVT prophylaxis - I restarted lovenox Rec: continue supportive care. We will be available this weekend as needed. Please call for questions.  Jaquay Posthumus,M.D.  11/17/2013 7:24 AM

## 2013-11-18 ENCOUNTER — Inpatient Hospital Stay (HOSPITAL_COMMUNITY): Payer: Medicare Other

## 2013-11-18 DIAGNOSIS — J189 Pneumonia, unspecified organism: Secondary | ICD-10-CM

## 2013-11-18 LAB — GLUCOSE, CAPILLARY
Glucose-Capillary: 107 mg/dL — ABNORMAL HIGH (ref 70–99)
Glucose-Capillary: 179 mg/dL — ABNORMAL HIGH (ref 70–99)
Glucose-Capillary: 188 mg/dL — ABNORMAL HIGH (ref 70–99)
Glucose-Capillary: 156 mg/dL — ABNORMAL HIGH (ref 70–99)

## 2013-11-18 LAB — CBC
HCT: 23.5 % — ABNORMAL LOW (ref 36.0–46.0)
Hemoglobin: 8 g/dL — ABNORMAL LOW (ref 12.0–15.0)
MCH: 29.2 pg (ref 26.0–34.0)
MCHC: 34 g/dL (ref 30.0–36.0)
MCV: 85.8 fL (ref 78.0–100.0)
PLATELETS: 224 10*3/uL (ref 150–400)
RBC: 2.74 MIL/uL — ABNORMAL LOW (ref 3.87–5.11)
RDW: 15.5 % (ref 11.5–15.5)
WBC: 12.8 10*3/uL — AB (ref 4.0–10.5)

## 2013-11-18 LAB — VANCOMYCIN, RANDOM: Vancomycin Rm: 28 ug/mL

## 2013-11-18 LAB — BASIC METABOLIC PANEL
BUN: 47 mg/dL — ABNORMAL HIGH (ref 6–23)
CHLORIDE: 99 meq/L (ref 96–112)
CO2: 22 mEq/L (ref 19–32)
CREATININE: 2.01 mg/dL — AB (ref 0.50–1.10)
Calcium: 7.6 mg/dL — ABNORMAL LOW (ref 8.4–10.5)
GFR calc non Af Amer: 24 mL/min — ABNORMAL LOW (ref >90)
GFR, EST AFRICAN AMERICAN: 28 mL/min — AB (ref >90)
Glucose, Bld: 106 mg/dL — ABNORMAL HIGH (ref 70–99)
Potassium: 3.7 mEq/L (ref 3.7–5.3)
Sodium: 136 mEq/L — ABNORMAL LOW (ref 137–147)

## 2013-11-18 LAB — PRO B NATRIURETIC PEPTIDE: Pro B Natriuretic peptide (BNP): 9424 pg/mL — ABNORMAL HIGH (ref 0–125)

## 2013-11-18 MED ORDER — BUDESONIDE 0.25 MG/2ML IN SUSP
0.2500 mg | Freq: Two times a day (BID) | RESPIRATORY_TRACT | Status: DC
  Administered 2013-11-18 – 2013-11-20 (×4): 0.25 mg via RESPIRATORY_TRACT
  Filled 2013-11-18 (×6): qty 2

## 2013-11-18 MED ORDER — SODIUM CHLORIDE 0.9 % IV SOLN
1500.0000 mg | INTRAVENOUS | Status: DC
  Administered 2013-11-19: 1500 mg via INTRAVENOUS
  Filled 2013-11-18: qty 1500

## 2013-11-18 MED ORDER — DEXTROSE 5 % IV SOLN
1.0000 g | INTRAVENOUS | Status: DC
  Administered 2013-11-18 – 2013-11-19 (×2): 1 g via INTRAVENOUS
  Filled 2013-11-18 (×3): qty 1

## 2013-11-18 NOTE — Progress Notes (Signed)
[facility] PROGRESS NOTE Interim History: 68 year old that came in to admit and further fevers, nonketotic hyperglycemic state was found to have MRSA on her urine and and 2 out of 2 blood cultures positive for MRSA, she also has a biventricular defibrillator in situ. Currently on an insulin drip and on Vanc and Rifampin. Infectious disease Dr. Graylon Good was consulted and recommended transfer her to come. Will come to the step down unit on an insulin drip and will probably need cardiology for TEE. [facility]mitted to [facility] develop septic shock requiring pressors and intubated. Had removal of her ICD and wire.   Assessment/Plan: Acute respiratory failure likely related to septic emboli, uncompensated metabolic acidosis, Septic shock and HCAP: - Intubated 2.5.2015 extubated on 2.6.2015. - labor breathing and mild hypoxia with activity today  -CXR with new left lung opacity and patient with low grade fever (will add cefepime for HCAP therapy) -feeling better today -continue diuresis; now with lasix PO -anemia and deconditioning playing role in her SOB  Septic shock due MRSA Bacteremia/Leukocytosis/endocarditis: - Requiring pressor on 2.5.2015-2.6.2015. - S/p ICD and wire removal, with SVC thrombus. - Started empirically on Vanc 2.2.2015. PICC line placed. - Mild elevation on trops likely related to hypotension and  demand ischemia. -will need IV antibiotics until March 24; appreciate ID assistance -low grade fever (probably due to HCAP)  Left first MTP ulceration on X-ray concern for osteomyelitis: - Cont vanc.  - ID rec holding on MRI; since might not change therapy  Nonischemic cardiomyopathy/Chronic systolic heart failure EF 45%: - resume coreg, d/c norvasc, cont hydralazine and add lasix  -no crackles on exam today; still with trace to 1 + LE edema - cont to monitor HR and SBP. - cont to add medications as tolerate for her HF. - appriciate cardiology assistance; continue imdur (which  would also help BP) -continue PO lasix and low sodium diet -daily weight and strict I's and O's  AKI on chronic kidney disease/diabetic nephropathyHyponatremia: - Due to septic shock -diuresis IV might be also precipitate some worsening of renal function -baseline Cr 1.6-1.8 -Cr  On 2/14 (2.01)  Diabete II: - cont lantus and SSI.  Acute encephalopathy: -appears to be back to baseline; patient is now able to answer questions appropriately and follow commands.  GERD -will continue protonix   Physical deconditioning -SNF  Anemia -symptomatic -Hgb 8.0 -after 2 units of PRBC's on 2/12 -follow Hgb trend -check FOBT  HTN -BP better now; will monitor  HCAP -patient with low grade fever and new left lung opacity -will add cefepime to current antibiotics for her bacteremia and follow clinical response -will also start pulmicort  Code Status: full  Family Communication: No family at bedside  Disposition Plan: needs SNF; no family available 24/7 or with knowledge to deal with antibiotic therapy.   Consultants:  ID  Cardiology  PCCM  SIGNIFICANT EVENTS / STUDIES:  2/1 admission to [facility] for hyperglycemia, on insulin drip  2/1 BCx with MRSA, started on vanc  2/2 ID c/s and rifampin and cefazolin added  2/3 TTE EF 45% global hypokinesis, moderate LVH, grade 1 diastolic dysfunction, shadowing at level of tricuspid valve (vegetation cannot be completely excluded).  2/4 transferred to [facility] from [facility]  2/5- removal wires, shock, ETT  2/5 pressors off  2/6 extubated  2/12 2 units of PRBC's transfused  LINES / TUBES:  2/2 PICC >>>  2/5 ETT >>> 2/6  2/5 rt cordis>>>   CULTURES:  1/31 Stool Cx >>> no growth  1/31 BCx x2 >>> MRSA  1/31 UCx >>> MRSA  1/31 C diff >>> neg  2/2 Nasal PCR >>> MRSA  2/2 BCx >>> staph aureus, awaiting susceptibilities  2/2 BCx >>> negative  2/4 BCx x2 >>> NGTD  2/5 UCx >>> negative   ANTIBIOTICS:  2/2 Vanc >>>  2/14  cefepime  HPI/Subjective: No CP, denies significant SOB. Low grade fever. CXR with new left lung opacity.  Objective: Filed Vitals:   11/17/13 1830 11/17/13 2042 11/18/13 0006 11/18/13 0525  BP: 160/64 170/65 153/66 157/63  Pulse:  69  78  Temp:  99.2 F (37.3 C) 100.8 F (38.2 C) 99.1 F (37.3 C)  TempSrc:  Oral Oral Oral  Resp:  18 20 20   Height:      Weight:    81 kg (178 lb 9.2 oz)  SpO2:  98% 98% 96%    Intake/Output Summary (Last 24 hours) at 11/18/13 1411 Last data filed at 11/18/13 1100  Gross per 24 hour  Intake    840 ml  Output   1250 ml  Net   -410 ml   Filed Weights   11/16/13 0722 11/17/13 0536 11/18/13 0525  Weight: 77.9 kg (171 lb 11.8 oz) 80.3 kg (177 lb 0.5 oz) 81 kg (178 lb 9.2 oz)    Exam:  General: Alert, awake, oriented x3, in no acute distress after oxygen applied.  HEENT: No bruits, no goiter. no JVD Heart: Regular rate and rhythm, without murmurs, rubs, gallops. Trace to 1+  edema bilaterally Lungs: Good air movement, no frank crackle on exam Abdomen: Soft, nontender, nondistended, positive bowel sounds.   Data Reviewed: Basic Metabolic Panel:  Recent Labs Lab 11/12/13 0400 11/13/13 0800 11/14/13 0406 11/15/13 1145 11/17/13 0600 11/18/13 0500  NA 135* 132* 133* 136* 135* 136*  K 3.5* 3.9 3.9 3.8 3.7 3.7  CL 99 96 97 99 99 99  CO2 21 20 21 22 23 22   GLUCOSE 138* 80 164* 157* 102* 106*  BUN 67* 57* 59* 55* 49* 47*  CREATININE 1.97* 1.69* 1.76* 1.91* 2.02* 2.01*  CALCIUM 7.6* 7.7* 7.6* 7.6* 7.8* 7.6*  MG 2.3 2.3  --   --   --   --   PHOS 4.0 3.7  --   --   --   --    Liver Function Tests: No results found for this basename: AST, ALT, ALKPHOS, BILITOT, PROT, ALBUMIN,  in the last 168 hours No results found for this basename: LIPASE, AMYLASE,  in the last 168 hours No results found for this basename: AMMONIA,  in the last 168 hours CBC:  Recent Labs Lab 11/12/13 0400 11/13/13 0800 11/15/13 1145 11/17/13 0200 11/18/13 0500   WBC 20.7* 23.4* 14.9* 14.9* 12.8*  HGB 7.4* 7.7* 7.3* 8.9* 8.0*  HCT 21.0* 22.5* 20.7* 25.1* 23.5*  MCV 83.3 84.0 84.5 83.7 85.8  PLT 201 243 234 246 224   BNP (last 3 results)  Recent Labs  11/18/13 0500  PROBNP 9424.0*   CBG:  Recent Labs Lab 11/17/13 1115 11/17/13 1635 11/17/13 2125 11/18/13 0606 11/18/13 1111  GLUCAP 135* 126* 130* 107* 179*    Recent Results (from the past 240 hour(s))  CULTURE, BLOOD (ROUTINE X 2)     Status: None   Collection Time    11/08/13  8:40 PM      Result Value Ref Range Status   Specimen Description BLOOD PICC LINE   Final   Special Requests BOTTLES DRAWN AEROBIC AND ANAEROBIC 10CC  Final   Culture  Setup Time     Final   Value: 11/09/2013 00:27     Performed at [facility]   Culture     Final   Value: NO GROWTH 5 DAYS     Performed at [facility]   Report Status 11/15/2013 FINAL   Final  CULTURE, BLOOD (ROUTINE X 2)     Status: None   Collection Time    11/08/13  8:45 PM      Result Value Ref Range Status   Specimen Description BLOOD LEFT ARM   Final   Special Requests BOTTLES DRAWN AEROBIC AND ANAEROBIC 10CC   Final   Culture  Setup Time     Final   Value: 11/09/2013 00:26     Performed at [facility]   Culture     Final   Value: NO GROWTH 5 DAYS     Performed at [facility]   Report Status 11/15/2013 FINAL   Final  URINE CULTURE     Status: None   Collection Time    11/09/13 12:06 PM      Result Value Ref Range Status   Specimen Description URINE, CATHETERIZED   Final   Special Requests Normal   Final   Culture  Setup Time     Final   Value: 11/09/2013 13:57     Performed at [place]     Final   Value: NO GROWTH     Performed at [facility]   Culture     Final   Value: NO GROWTH     Performed at [facility]   Report Status 11/10/2013 FINAL   Final     Studies: Dg Chest 2 View  11/18/2013   CLINICAL DATA:  Shortness of breath.   Weakness.  EXAM: CHEST  2 VIEW  COMPARISON:  DG CHEST 1V PORT dated 11/10/2013; DG CHEST 1V PORT dated 11/09/2013; DG CHEST 2 VIEW dated 11/04/2013; DG CHEST 2 VIEW dated 11/10/2012; DG CHEST 2 VIEW dated 05/27/2011  FINDINGS: Interval extubation. Right upper extremity PICC line is present with tip projecting over the superior vena cava. Stable cardiomegaly. Interval development of consolidative opacity within the left upper lung. Right lung is well aerated. No pleural effusion or pneumothorax.  IMPRESSION: New consolidative opacity within the left upper lung may represent pneumonia. Recommend short-term followup radiograph to ensure resolution.   Electronically Signed   By: Lovey Newcomer M.D.   On: 11/18/2013 12:31    Scheduled Meds: . amLODipine  10 mg Oral Daily  . antiseptic oral rinse  15 mL Mouth Rinse QID  . carvedilol  12.5 mg Oral BID WC  . ceFEPime (MAXIPIME) IV  1 g Intravenous Q12H  . enoxaparin (LOVENOX) injection  30 mg Subcutaneous Q24H  . furosemide  40 mg Oral BID  . hydrALAZINE  100 mg Oral QID  . insulin aspart  0-15 Units Subcutaneous TID WC  . insulin aspart  0-5 Units Subcutaneous QHS  . insulin glargine  15 Units Subcutaneous Daily  . isosorbide mononitrate  30 mg Oral Daily  . pantoprazole  40 mg Oral BID   Continuous Infusions: . sodium chloride      Time < 30 minutes  Zorianna Taliaferro  [facility] Pager [phone]. If 8PM-8AM, please contact night-coverage at [email or web address], password [facility] 11/18/2013, 2:11 PM  LOS: 14 days

## 2013-11-18 NOTE — Progress Notes (Signed)
ANTIBIOTIC CONSULT NOTE - FOLLOW UP  Pharmacy Consult:  Vancomycin Indication:  MRSA bacteremia + endocarditis + possible osteomyelitis  Allergies  Allergen Reactions  . Hydrocodone-Acetaminophen Other (See Comments)    hallucinations   Patient Measurements: Height: 5\' 3"  (160 cm) Weight: 178 lb 9.2 oz (81 kg) (scale C) IBW/kg (Calculated) : 52.4  Vital Signs: Temp: 99.1 F (37.3 C) (02/14 0525) Temp src: Oral (02/14 0525) BP: 157/63 mmHg (02/14 0525) Pulse Rate: 78 (02/14 0525) Intake/Output from previous day: 02/13 0701 - 02/14 0700 In: 720 [P.O.:720] Out: 1600 [Urine:1600] Intake/Output from this shift: Total I/O In: 360 [P.O.:360] Out: 400 [Urine:400]  Labs:  Recent Labs  11/17/13 0200 11/17/13 0600 11/18/13 0500  WBC 14.9*  --  12.8*  HGB 8.9*  --  8.0*  PLT 246  --  224  CREATININE  --  2.02* 2.01*   Estimated Creatinine Clearance: 27.4 ml/min (by C-G formula based on Cr of 2.01).  Recent Labs  11/16/13 0930 11/17/13 0600 11/18/13 0500  VANCOTROUGH 29.2*  --   --   VANCORANDOM  --  38.1 28.0    Microbiology: Recent Results (from the past 720 hour(s))  URINE CULTURE     Status: None   Collection Time    11/04/13 11:50 AM      Result Value Ref Range Status   Specimen Description URINE, CLEAN CATCH   Final   Special Requests NONE   Final   Culture  Setup Time     Final   Value: 11/04/2013 21:17     Performed at [facility]     Final   Value: 65,000 COLONIES/ML     Performed at [facility]   Culture     Final   Value: METHICILLIN RESISTANT STAPHYLOCOCCUS AUREUS     Note: RIFAMPIN AND GENTAMICIN SHOULD NOT BE USED AS SINGLE DRUGS FOR TREATMENT OF STAPH INFECTIONS. CRITICAL RESULT CALLED TO, READ BACK BY AND VERIFIED WITH: JESSICA C@3 :05PM ON 11/06/13 BY DANTS     Performed at [facility]   Report Status 11/06/2013 FINAL   Final   Organism ID, Bacteria METHICILLIN RESISTANT STAPHYLOCOCCUS AUREUS   Final   STOOL CULTURE     Status: None   Collection Time    11/04/13  3:55 PM      Result Value Ref Range Status   Specimen Description STOOL   Final   Special Requests NONE   Final   Culture     Final   Value: NO SALMONELLA, SHIGELLA, CAMPYLOBACTER, YERSINIA, OR E.COLI 0157:H7 ISOLATED     Performed at [facility]   Report Status 11/08/2013 FINAL   Final  CLOSTRIDIUM DIFFICILE BY PCR     Status: None   Collection Time    11/04/13  3:55 PM      Result Value Ref Range Status   C difficile by pcr NEGATIVE  NEGATIVE Final  CULTURE, BLOOD (ROUTINE X 2)     Status: None   Collection Time    11/04/13  5:13 PM      Result Value Ref Range Status   Specimen Description BLOOD LEFT HAND   Final   Special Requests BOTTLES DRAWN AEROBIC AND ANAEROBIC 10CC EACH   Final   Culture  Setup Time     Final   Value: 11/05/2013 20:50     Performed at [facility]   Culture     Final   Value: METHICILLIN RESISTANT STAPHYLOCOCCUS  AUREUS     Note: RIFAMPIN AND GENTAMICIN SHOULD NOT BE USED AS SINGLE DRUGS FOR TREATMENT OF STAPH INFECTIONS. CRITICAL RESULT CALLED TO, READ BACK BY AND VERIFIED WITH: JESSICA CHILDRESS@0913  ON [id number] BY [facility]     Note: Gram Stain Report Called to,Read Back By and Verified With: HAWKINS M AT 0729A ON [id number] BY THOMPSON S Performed at [facility]     Performed at [facility]   Report Status 11/07/2013 FINAL   Final   Organism ID, Bacteria METHICILLIN RESISTANT STAPHYLOCOCCUS AUREUS   Final  CULTURE, BLOOD (ROUTINE X 2)     Status: None   Collection Time    11/04/13  5:19 PM      Result Value Ref Range Status   Specimen Description BLOOD RIGHT ARM   Final   Special Requests BOTTLES DRAWN AEROBIC AND ANAEROBIC 10CC EACH   Final   Culture  Setup Time     Final   Value: 11/05/2013 20:50     Performed at [facility]   Culture     Final   Value: STAPHYLOCOCCUS AUREUS     Note: SUSCEPTIBILITIES PERFORMED ON PREVIOUS CULTURE WITHIN THE LAST 5  DAYS.     Note: Gram Stain Report Called to,Read Back By and Verified With: HAWKINS M AT 0729A ON [id number] BY THOMPSON S Performed at [facility]     Performed at [facility]   Report Status 11/07/2013 FINAL   Final  CULTURE, BLOOD (ROUTINE X 2)     Status: None   Collection Time    11/06/13 12:28 PM      Result Value Ref Range Status   Specimen Description BLOOD RIGHT HAND   Final   Special Requests     Final   Value: BOTTLES DRAWN AEROBIC AND ANAEROBIC 8CC EACH BOTTLE   Culture NO GROWTH 5 DAYS   Final   Report Status 11/11/2013 FINAL   Final  MRSA PCR SCREENING     Status: Abnormal   Collection Time    11/06/13 12:30 PM      Result Value Ref Range Status   MRSA by PCR POSITIVE (*) NEGATIVE Final   Comment:            The GeneXpert MRSA Assay (FDA     approved for NASAL specimens     only), is one component of a     comprehensive MRSA colonization     surveillance program. It is not     intended to diagnose MRSA     infection nor to guide or     monitor treatment for     MRSA infections.     RESULT CALLED TO, READ BACK BY AND VERIFIED WITH:     CRUISE,J. AT [id number] ON 11/06/2013 BY BAUGHAM,M.  CULTURE, BLOOD (ROUTINE X 2)     Status: None   Collection Time    11/06/13  1:30 PM      Result Value Ref Range Status   Specimen Description BLOOD RIGHT HAND   Final   Special Requests     Final   Value: BOTTLES DRAWN AEROBIC AND ANAEROBIC AEB=6CC ANA=4CC   Culture  Setup Time     Final   Value: 11/08/2013 01:26     Performed at [facility]   Culture     Final   Value: STAPHYLOCOCCUS AUREUS     Note: SUSCEPTIBILITIES PERFORMED ON PREVIOUS CULTURE WITHIN THE LAST 5 DAYS.  Note: Performed at [facility] Gram Stain Report Called to,Read Back By and Verified With: Tuttle ON 11/07/13 BY Tyrone Schimke M     Performed at [facility]   Report Status 11/09/2013 FINAL   Final  CULTURE, BLOOD (ROUTINE X 2)     Status: None   Collection Time     11/08/13  8:40 PM      Result Value Ref Range Status   Specimen Description BLOOD PICC LINE   Final   Special Requests BOTTLES DRAWN AEROBIC AND ANAEROBIC 10CC   Final   Culture  Setup Time     Final   Value: 11/09/2013 00:27     Performed at [facility]   Culture     Final   Value: NO GROWTH 5 DAYS     Performed at [facility]   Report Status 11/15/2013 FINAL   Final  CULTURE, BLOOD (ROUTINE X 2)     Status: None   Collection Time    11/08/13  8:45 PM      Result Value Ref Range Status   Specimen Description BLOOD LEFT ARM   Final   Special Requests BOTTLES DRAWN AEROBIC AND ANAEROBIC 10CC   Final   Culture  Setup Time     Final   Value: 11/09/2013 00:26     Performed at [facility]   Culture     Final   Value: NO GROWTH 5 DAYS     Performed at [facility]   Report Status 11/15/2013 FINAL   Final  URINE CULTURE     Status: None   Collection Time    11/09/13 12:06 PM      Result Value Ref Range Status   Specimen Description URINE, CATHETERIZED   Final   Special Requests Normal   Final   Culture  Setup Time     Final   Value: 11/09/2013 13:57     Performed at [facility]     Final   Value: NO GROWTH     Performed at [facility]   Culture     Final   Value: NO GROWTH     Performed at [facility]   Report Status 11/10/2013 FINAL   Final   Assessment: 54 YOF admitted for DKA, fever, and FTT found to have MRSA bacteremia, septic endocarditis, and possible osteomyelitis of left first MTP.  Patient continues on vancomycin therapy although dose has been held due to inadequate clearance.  Plan is for 6 weeks of therapy, through March 14th. New chest xray today reveals a pneumonia which should be covered with her Cefepime and Vancomycin.  Her SCr remains slightly above her baseline of 1.6-1.8, UOP is ok. Her vancomycin random level this morning is still elevated at 28.0 indicating a slower than  expected clearance.  I suspect she will require every 48 hour dosing to allow her an extended interval for clearance.  Calculated based on her initial trough level: Give Vancomycin  1500 mg  q 48 hrs. Infuse over 2 hrs Expected Cpeak: 42.0 mcg/ml    Expected Ctrough: 16.7 mcg/ml  Vanc 2/1 >> Rifampin 2/2 >>2/6  2/5 Urine - negative 2/4 Blood - NGTD 2/2 Blood - MRSA x1 1/31 Stool - negative 1/31 Blood - MRSA x2 (sens vanc, bactrim, rifampin) 1/31 Urine - 65k MRSA 1/31 Cdiff PCR - positive  Goal of Therapy:  Vancomycin trough level 15-20 mcg/ml  Plan:  -Will hold dose again today and begin a new regimen of 1500mg  every 48 hours.  Will monitor renal function closely to ensure appropriate clearance.   - Will obtain a s/s Vancomycin level after her 3rd dose to make sure she is not accumulating.  Rober Minion, PharmD., MS Clinical Pharmacist Pager:  [phone] Thank you for allowing pharmacy to be part of this patients care team. 11/18/2013 2:21 PM

## 2013-11-19 LAB — GLUCOSE, CAPILLARY
GLUCOSE-CAPILLARY: 92 mg/dL (ref 70–99)
Glucose-Capillary: 119 mg/dL — ABNORMAL HIGH (ref 70–99)
Glucose-Capillary: 192 mg/dL — ABNORMAL HIGH (ref 70–99)
Glucose-Capillary: 166 mg/dL — ABNORMAL HIGH (ref 70–99)

## 2013-11-19 MED ORDER — VANCOMYCIN HCL 10 G IV SOLR: 1500.0000 mg | INTRAVENOUS | Status: DC

## 2013-11-19 NOTE — Progress Notes (Signed)
[facility] PROGRESS NOTE Interim History: 68 year old that came in to admit and further fevers, nonketotic hyperglycemic state was found to have MRSA on her urine and and 2 out of 2 blood cultures positive for MRSA, she also has a biventricular defibrillator in situ. Currently on an insulin drip and on Vanc and Rifampin. Infectious disease Dr. Graylon Good was consulted and recommended transfer her to come. Will come to the step down unit on an insulin drip and will probably need cardiology for TEE. [facility]mitted to [facility] develop septic shock requiring pressors and intubated. Had removal of her ICD and wire.   Assessment/Plan: Acute respiratory failure likely related to septic emboli, uncompensated metabolic acidosis, Septic shock and HCAP: - Intubated 2.5.2015 extubated on 2.6.2015. - labor breathing and mild hypoxia with activity today  -CXR with new left lung opacity and patient with low grade fever (will continue cefepime for HCAP therapy) -feeling better today -continue diuresis; now with lasix PO -anemia and deconditioning playing role in her SOB  Septic shock due MRSA Bacteremia/Leukocytosis/endocarditis: - Requiring pressor on 2.5.2015-2.6.2015. - S/p ICD and wire removal, with SVC thrombus. - Started empirically on Vanc 2.2.2015. PICC line placed. - Mild elevation on trops likely related to hypotension and  demand ischemia. -will need IV antibiotics until March 24; appreciate ID assistance -low grade fever (probably due to HCAP)  Left first MTP ulceration on X-ray concern for osteomyelitis: - Cont vanc.  - ID rec holding on MRI; since might not change therapy -Will ask wound care service to evaluate and provide recommendations for dressings.   Nonischemic cardiomyopathy/Chronic systolic heart failure EF 45%: - resume coreg, d/c norvasc, cont hydralazine and add lasix  -no crackles on exam today; still with trace to 1 + LE edema - cont to monitor HR and SBP. - cont to add  medications as tolerate for her HF. - appriciate cardiology assistance; continue imdur (which would also help BP) -continue PO lasix and low sodium diet -daily weight and strict I's and O's  AKI on chronic kidney disease/diabetic nephropathyHyponatremia: - Due to septic shock -diuresis IV might be also precipitate some worsening of renal function -baseline Cr 1.6-1.8 -Cr  On 2/14 (2.01)  Diabete II: - cont lantus and SSI.  Acute encephalopathy: -appears to be back to baseline; patient is now able to answer questions appropriately and follow commands.  GERD -will continue protonix   Physical deconditioning -SNF  Anemia -symptomatic -Hgb 8.0 (Drawn on 2/14)  -after 2 units of PRBC's on 2/12 -follow Hgb trend -Fecal occult Blood tests negative so far  -CBC in a.m.  HTN -BP better now; will monitor  HCAP -patient with low grade fever and new left lung opacity -will add cefepime to current antibiotics for her bacteremia and follow clinical response -will also start pulmicort  Code Status: full  Family Communication: No family at bedside  Disposition Plan: needs SNF; no family available 24/7 or with knowledge to deal with antibiotic therapy.   Consultants:  ID  Cardiology  PCCM  SIGNIFICANT EVENTS / STUDIES:  2/1 admission to [facility] for hyperglycemia, on insulin drip  2/1 BCx with MRSA, started on vanc  2/2 ID c/s and rifampin and cefazolin added  2/3 TTE EF 45% global hypokinesis, moderate LVH, grade 1 diastolic dysfunction, shadowing at level of tricuspid valve (vegetation cannot be completely excluded).  2/4 transferred to [facility] from [facility]  2/5- removal wires, shock, ETT  2/5 pressors off  2/6 extubated  2/12 2 units of PRBC's transfused  LINES / TUBES:  2/2 PICC >>>  2/5 ETT >>> 2/6  2/5 rt cordis>>>   CULTURES:  1/31 Stool Cx >>> no growth  1/31 BCx x2 >>> MRSA  1/31 UCx >>> MRSA  1/31 C diff >>> neg  2/2 Nasal PCR >>> MRSA  2/2 BCx >>> staph  aureus, awaiting susceptibilities  2/2 BCx >>> negative  2/4 BCx x2 >>> NGTD  2/5 UCx >>> negative   ANTIBIOTICS:  2/2 Vanc >>>  2/14 cefepime  HPI/Subjective: No CP, denies significant SOB. Low grade fever.   Objective: Filed Vitals:   11/19/13 0557 11/19/13 0801 11/19/13 0959 11/19/13 1300  BP: 164/71  146/60 147/80  Pulse: 59  64 70  Temp: 98.6 F (37 C)     TempSrc: Oral     Resp: 18     Height:      Weight: 80.604 kg (177 lb 11.2 oz)     SpO2: 95% 98% 96%     Intake/Output Summary (Last 24 hours) at 11/19/13 1539 Last data filed at 11/19/13 0500  Gross per 24 hour  Intake     10 ml  Output    750 ml  Net   -740 ml   Filed Weights   11/17/13 0536 11/18/13 0525 11/19/13 0557  Weight: 80.3 kg (177 lb 0.5 oz) 81 kg (178 lb 9.2 oz) 80.604 kg (177 lb 11.2 oz)    Exam:  General: Alert, awake, oriented x3, in no acute distress after oxygen applied.  HEENT: No bruits, no goiter. no JVD Heart: Regular rate and rhythm, without murmurs, rubs, gallops. Trace to 1+  edema bilaterally Lungs: Good air movement, no frank crackle on exam Abdomen: Soft, nontender, nondistended, positive bowel sounds.   Data Reviewed: Basic Metabolic Panel:  Recent Labs Lab 11/13/13 0800 11/14/13 0406 11/15/13 1145 11/17/13 0600 11/18/13 0500  NA 132* 133* 136* 135* 136*  K 3.9 3.9 3.8 3.7 3.7  CL 96 97 99 99 99  CO2 20 21 22 23 22   GLUCOSE 80 164* 157* 102* 106*  BUN 57* 59* 55* 49* 47*  CREATININE 1.69* 1.76* 1.91* 2.02* 2.01*  CALCIUM 7.7* 7.6* 7.6* 7.8* 7.6*  MG 2.3  --   --   --   --   PHOS 3.7  --   --   --   --    CBC:  Recent Labs Lab 11/13/13 0800 11/15/13 1145 11/17/13 0200 11/18/13 0500  WBC 23.4* 14.9* 14.9* 12.8*  HGB 7.7* 7.3* 8.9* 8.0*  HCT 22.5* 20.7* 25.1* 23.5*  MCV 84.0 84.5 83.7 85.8  PLT 243 234 246 224   BNP (last 3 results)  Recent Labs  11/18/13 0500  PROBNP 9424.0*   CBG:  Recent Labs Lab 11/18/13 1111 11/18/13 1633 11/18/13 2129  11/19/13 0548 11/19/13 1142  GLUCAP 179* 188* 156* 119* 192*    Studies: Dg Chest 2 View  11/18/2013   CLINICAL DATA:  Shortness of breath.  Weakness.  EXAM: CHEST  2 VIEW  COMPARISON:  DG CHEST 1V PORT dated 11/10/2013; DG CHEST 1V PORT dated 11/09/2013; DG CHEST 2 VIEW dated 11/04/2013; DG CHEST 2 VIEW dated 11/10/2012; DG CHEST 2 VIEW dated 05/27/2011  FINDINGS: Interval extubation. Right upper extremity PICC line is present with tip projecting over the superior vena cava. Stable cardiomegaly. Interval development of consolidative opacity within the left upper lung. Right lung is well aerated. No pleural effusion or pneumothorax.  IMPRESSION: New consolidative opacity within the left upper lung may represent  pneumonia. Recommend short-term followup radiograph to ensure resolution.   Electronically Signed   By: Lovey Newcomer M.D.   On: 11/18/2013 12:31    Scheduled Meds: . amLODipine  10 mg Oral Daily  . antiseptic oral rinse  15 mL Mouth Rinse QID  . budesonide (PULMICORT) nebulizer solution  0.25 mg Nebulization BID  . carvedilol  12.5 mg Oral BID WC  . ceFEPime (MAXIPIME) IV  1 g Intravenous Q24H  . enoxaparin (LOVENOX) injection  30 mg Subcutaneous Q24H  . furosemide  40 mg Oral BID  . hydrALAZINE  100 mg Oral QID  . insulin aspart  0-15 Units Subcutaneous TID WC  . insulin aspart  0-5 Units Subcutaneous QHS  . insulin glargine  15 Units Subcutaneous Daily  . isosorbide mononitrate  30 mg Oral Daily  . pantoprazole  40 mg Oral BID  . [START ON 11/21/2013] vancomycin  1,500 mg Intravenous Q48H   Continuous Infusions: . sodium chloride      Time < 30 minutes  Apollo Timothy  [facility] Pager [phone]. If 8PM-8AM, please contact night-coverage at [email or web address], password [facility] 11/19/2013, 3:39 PM  LOS: 15 days

## 2013-11-20 DIAGNOSIS — L97509 Non-pressure chronic ulcer of other part of unspecified foot with unspecified severity: Secondary | ICD-10-CM

## 2013-11-20 LAB — CBC
HCT: 22.8 % — ABNORMAL LOW (ref 36.0–46.0)
Hemoglobin: 7.7 g/dL — ABNORMAL LOW (ref 12.0–15.0)
MCH: 29.4 pg (ref 26.0–34.0)
MCHC: 33.8 g/dL (ref 30.0–36.0)
MCV: 87 fL (ref 78.0–100.0)
Platelets: 236 10*3/uL (ref 150–400)
RBC: 2.62 MIL/uL — ABNORMAL LOW (ref 3.87–5.11)
RDW: 15.1 % (ref 11.5–15.5)
WBC: 9 10*3/uL (ref 4.0–10.5)

## 2013-11-20 LAB — GLUCOSE, CAPILLARY
Glucose-Capillary: 104 mg/dL — ABNORMAL HIGH (ref 70–99)
Glucose-Capillary: 142 mg/dL — ABNORMAL HIGH (ref 70–99)
Glucose-Capillary: 130 mg/dL — ABNORMAL HIGH (ref 70–99)

## 2013-11-20 MED ORDER — PANTOPRAZOLE SODIUM 40 MG PO TBEC: 40.0000 mg | DELAYED_RELEASE_TABLET | Freq: Two times a day (BID) | ORAL | Status: DC

## 2013-11-20 MED ORDER — ISOSORBIDE MONONITRATE ER 30 MG PO TB24: 30.0000 mg | ORAL_TABLET | Freq: Every day | ORAL | Status: DC

## 2013-11-20 MED ORDER — INSULIN GLARGINE 100 UNIT/ML ~~LOC~~ SOLN: 35.0000 [IU] | Freq: Every day | SUBCUTANEOUS | Status: DC

## 2013-11-20 MED ORDER — DEXTROSE 5 % IV SOLN: 1.0000 g | INTRAVENOUS | Status: DC

## 2013-11-20 MED ORDER — HEPARIN SOD (PORK) LOCK FLUSH 100 UNIT/ML IV SOLN
250.0000 [IU] | INTRAVENOUS | Status: AC | PRN
  Administered 2013-11-20: 250 [IU]

## 2013-11-20 MED ORDER — GLUCERNA SHAKE PO LIQD: 237.0000 mL | Freq: Every day | ORAL | Status: DC | PRN

## 2013-11-20 MED ORDER — BUDESONIDE 0.25 MG/2ML IN SUSP: 0.2500 mg | Freq: Two times a day (BID) | RESPIRATORY_TRACT | Status: DC

## 2013-11-20 MED ORDER — VANCOMYCIN HCL 10 G IV SOLR: 1500.0000 mg | INTRAVENOUS | Status: DC

## 2013-11-20 MED ORDER — TRAMADOL HCL 50 MG PO TABS: 50.0000 mg | ORAL_TABLET | ORAL | Status: DC | PRN

## 2013-11-20 MED ORDER — HYDRALAZINE HCL 100 MG PO TABS
100.0000 mg | ORAL_TABLET | Freq: Four times a day (QID) | ORAL | Status: DC
Start: 2013-11-20 — End: 2014-01-10

## 2013-11-20 MED ORDER — FUROSEMIDE 40 MG PO TABS: 40.0000 mg | ORAL_TABLET | Freq: Two times a day (BID) | ORAL | Status: DC

## 2013-11-20 NOTE — Progress Notes (Signed)
Occupational Therapy Treatment Patient Details Name: [name] MRN: [id number] DOB: 02-Aug-1946 Today's Date: 11/20/2013 Time: 1111-1130 OT Time Calculation (min): 19 min  OT Assessment / Plan / Recommendation  History of present illness Patient is a 68 yo female with history of DM 2, HTN, CHF (EF 20%), CAD, PAD, ICD, Depression who presented to the hospital for hyperglycemia and failure to thrive, also with fever. During initial hospitalization she had blood cultures drawn that revealed MRSA. She had her ICD removed today for concern for septic source and developed hypotension following the procedure. Pt with ARF and had to be intubated.   OT comments  Pt required encouragement to participate in OT. Pt stated, " If I'm going somewhere else for therapy, why do I have to do it here? " OT reiterated to pt the benefits of therapy and that there is no guarantee that she will d/c to the SNF today as no beds are available to places she initially selected. Pt encouraged to participate with PT later today as well. Pt actually demos improvement from last session and should continue with acute OT services to increase level of function and safety   Follow Up Recommendations  SNF;Supervision/Assistance - 24 hour    Barriers to Discharge   none    Equipment Recommendations  None recommended by OT    Recommendations for Other Services    Frequency Min 2X/week   Progress towards OT Goals Progress towards OT goals: Progressing toward goals  Plan Discharge plan needs to be updated    Precautions / Restrictions Precautions Precautions: Fall Restrictions Weight Bearing Restrictions: No   Pertinent Vitals/Pain No c/o pain    ADL  Grooming: Performed;Wash/dry hands;Wash/dry face;Min guard Upper Body Bathing: Performed;Min guard;Chest;Right arm;Left arm Where Assessed - Upper Body Bathing: Supported standing Lower Body Bathing: Performed;Minimal assistance;Moderate assistance Where Assessed -  Lower Body Bathing: Supported standing Upper Body Dressing: Performed;Min guard Where Assessed - Upper Body Dressing: Supported standing Toilet Transfer: Performed;Minimal Print production planner Method: Sit to Loss adjuster, chartered: Bedside commode Toileting - Clothing Manipulation and Hygiene: Performed;Minimal assistance Where Assessed - Best boy and Hygiene: Standing Equipment Used: Gait belt;Rolling walker;Other (comment) (BSC) Transfers/Ambulation Related to ADLs: cues for control of descent    OT Diagnosis:    OT Problem List:   OT Treatment Interventions:     OT Goals(current goals can now be found in the care plan section)    Visit Information  Last OT Received On: 11/20/13 Assistance Needed: +1 History of Present Illness: Patient is a 68 yo female with history of DM 2, HTN, CHF (EF 20%), CAD, PAD, ICD, Depression who presented to the hospital for hyperglycemia and failure to thrive, also with fever. During initial hospitalization she had blood cultures drawn that revealed MRSA. She had her ICD removed today for concern for septic source and developed hypotension following the procedure. Pt with ARF and had to be intubated.    Subjective Data      Prior Functioning       Cognition  Cognition Arousal/Alertness: Awake/alert Behavior During Therapy: WFL for tasks assessed/performed Overall Cognitive Status: Within Functional Limits for tasks assessed    Mobility  Bed Mobility General bed mobility comments: pt up in recliner Transfers Overall transfer level: Needs assistance Equipment used: Rolling walker (2 wheeled) Transfers: Sit to/from Stand Sit to Stand: Min assist General transfer comment: cues for control of descent          Balance Balance Overall balance assessment:  Needs assistance;History of Falls Standing balance support: Single extremity supported;During functional activity;Bilateral upper extremity  supported Standing balance-Leahy Scale: Fair  End of Session OT - End of Session Equipment Utilized During Treatment: Gait belt;Rolling walker;Other (comment) (BSC) Activity Tolerance: Patient tolerated treatment well Patient left: in chair;with call bell/phone within reach;with nursing/sitter in room  GO     Britt Bottom 11/20/2013, 1:30 PM

## 2013-11-20 NOTE — Progress Notes (Signed)
Pt was discharged per stretcher assisted by the [facility] team. Pt not on any distress, denies any pain, no questions asked. Report was given by the previous RN to the SNF already.

## 2013-11-20 NOTE — Discharge Instructions (Signed)
° °  Supplemental Discharge Instructions following  Pacemaker/Defibrillator Extraction  Activity No heavy lifting or vigorous activity with your left arm 1 week.   WOUND CARE   Keep the wound area clean and dry.  Do not get this area wet for one week.  No showers for one week; you may shower on 11/27/2013.   DO  NOT apply any creams, oils, or ointments to the wound area.   If you notice any drainage or discharge from the wound, any swelling or bruising at the site, or you develop a fever > 101? F after you are discharged home, call the office at once.  _____________________________________________________________________________________ Wound Care Instructions from Wound Care consult Dressing procedure/placement/frequency: continue twice daily saline dressings and provide a pressure redistribution chair cushion

## 2013-11-20 NOTE — Progress Notes (Signed)
Physical Therapy Treatment Patient Details Name: JOLIANNA ENDRES MRN: [id number] DOB: 1946-05-17 Today's Date: 11/20/2013 Time: [id number] PT Time Calculation (min): 20 min  PT Assessment / Plan / Recommendation  History of Present Illness Patient is a 68 yo female with history of DM 2, HTN, CHF (EF 20%), CAD, PAD, ICD, Depression who presented to the hospital for hyperglycemia and failure to thrive, also with fever. During initial hospitalization she had blood cultures drawn that revealed MRSA. She had her ICD removed today for concern for septic source and developed hypotension following the procedure. Pt with ARF and had to be intubated.   PT Comments   Pt walked in place with RW and 1L O2 for 90 seconds.  Pt with ending pulse ox 85% and it took her 3-4 minutes to return to her baseline.  Pt educated on breathing technques iwht activity.  Follow Up Recommendations  SNF;Supervision/Assistance - 24 hour     Does the patient have the potential to tolerate intense rehabilitation     Barriers to Discharge        Equipment Recommendations  Rolling walker with 5" wheels;3in1 (PT)    Recommendations for Other Services    Frequency Min 3X/week   Progress towards PT Goals Progress towards PT goals: Progressing toward goals  Plan Current plan remains appropriate    Precautions / Restrictions Precautions Precautions: Fall Restrictions Weight Bearing Restrictions: No   Pertinent Vitals/Pain See above    Mobility  Transfers Overall transfer level: Needs assistance Transfers: Sit to/from Stand Sit to Stand: Min guard General transfer comment: pt cued to sit down slowly Ambulation/Gait General Gait Details: Pt didnt feel comfortable walking without chair behind her so wakled in place with RW and 1L o2.  Pt walked for 90 seconds with cues for breathing tchnques.  pt took one standing rest break.   pts ending O2 sats 84% and it ook 3-4 minutes for her to return to her baseline.  Education  given    Exercises     PT Diagnosis:    PT Problem List:   PT Treatment Interventions:     PT Goals (current goals can now be found in the care plan section)    Visit Information  Last PT Received On: 11/20/13 Assistance Needed: +1 History of Present Illness: Patient is a 67 yo female with history of DM 2, HTN, CHF (EF 20%), CAD, PAD, ICD, Depression who presented to the hospital for hyperglycemia and failure to thrive, also with fever. During initial hospitalization she had blood cultures drawn that revealed MRSA. She had her ICD removed today for concern for septic source and developed hypotension following the procedure. Pt with ARF and had to be intubated.    Subjective Data      Cognition  Cognition Arousal/Alertness: Awake/alert Behavior During Therapy: WFL for tasks assessed/performed Overall Cognitive Status: Within Functional Limits for tasks assessed    Balance     End of Session PT - End of Session Equipment Utilized During Treatment: Gait belt;Oxygen Activity Tolerance: Patient limited by fatigue Patient left: in chair;with call bell/phone within reach Nurse Communication: Mobility status   GP     Loyal Buba 11/20/2013, 1:11 PM  Rande Lawman, PT

## 2013-11-20 NOTE — Progress Notes (Signed)
Pt d/c to [facility] nsg center via ambulance.  Karie Kirks, RN

## 2013-11-20 NOTE — Progress Notes (Addendum)
Called and informed [name] that Dr. Dyann Kief stated that pt has to see the electrophysiologist on the 11/22/13 and MD will also address the sutures on Lt. Chest .  [name] verbalized understanding.  Karie Kirks, Therapist, sports.

## 2013-11-20 NOTE — Consult Note (Signed)
WOC wound consult note Reason for Consult:Wound (full thickness) on left foot, 1st metatarsal head (hallux).  Note, patient is followed by Dr. Meridee Score for this problem.  She has missed an appointment due to her hospitalization and will be making an appointment when she is released.  If you agree, please notify Dr. [name] of her admission. Wound type: Pressure vs infection Pressure Ulcer POA: Yes Measurement:Callous measuring 3cm x 2cm with a full thickness ulcer in the center measuring 1cm x 0.5cm x 0.2cm Wound bed:Red, moist.  90% red, 10% thin yellow fibrinous slough Drainage (amount, consistency, odor) Scant serous on old dressing Periwound:intact with darkened callous surrounding wound.  Callous is soft. Dressing procedure/placement/frequency:I will continue the twice daily saline dressings and also provide a pressure redistribution chair cushion for her as she did not sleep in the bed last night-claiming she was more comfortable in her chair. [facility] nursing team will not follow, but will remain available to this patient, the nursing and medical team.  Please re-consult if needed. Thanks, Maudie Flakes, MSN, RN, [place], [place], [place] [phone])

## 2013-11-20 NOTE — Progress Notes (Signed)
Rehab admissions - Spoke with pt who stated that plan is now for her to go to skilled nursing as she will need 6 weeks of antibiotics and that she does not have family support to help her with this. At this time, pursuing inpatient rehab is not appropriate in light of limited family support. Recommend skilled nursing per pt preference.  Please call me with any questions. Thanks.  Nanetta Batty, PT Rehabilitation Admissions Coordinator [phone]

## 2013-11-20 NOTE — Discharge Summary (Signed)
Physician Discharge Summary  [name] [id number] DOB: Feb 15, 1946 DOA: 11/04/2013  PCP: [name], MD  Admit date: 11/04/2013 Discharge date: 11/20/2013  Time spent: >30 minutes  Recommendations for Outpatient Follow-up:   Take medications as prescribed  Follow a low sodium diet (< 2 grams daily)  Wean oxygen slowly to RA as tolerated  Physical therapy as per SNF protocol  BMET and CBC in 1 week to follow electrolytes, renal function and Hgb  CXR 4 weeks to ensure resolution of infiltrates   Discharge Diagnoses:  Principal Problem:   MRSA bacteremia Active Problems:   HYPERLIPIDEMIA   CORONARY ATHEROSCLEROSIS NATIVE CORONARY ARTERY   Nonischemic cardiomyopathy   Chronic systolic heart failure   PAD (peripheral artery disease)   Biventricular implantable cardioverter-defibrillator in situ   Atherosclerosis of native arteries of the extremities with intermittent claudication   Hyperglycemia   Type 2 diabetes mellitus   Normocytic anemia   DJD (degenerative joint disease)   Foot ulcer, left   Septic shock(785.52)   Acute respiratory failure with hypoxia   Bacterial endocarditis   Discharge Condition: stable and improved. Will discharge to SNF to continue IV treatment and physical therapy.  Diet recommendation: low sodium diet  Filed Weights   11/18/13 0525 11/19/13 0557 11/20/13 0659  Weight: 81 kg (178 lb 9.2 oz) 80.604 kg (177 lb 11.2 oz) 81.6 kg (179 lb 14.3 oz)    History of present illness:  68 year old female with a past medical history significant for diabetes, hypertension, non ischemic cardiomyopathy, and congestive heart failure status post CRTD implant in 09/2010. She has done well since her device implant and was last seen in follow up by Dr [name] in 09/2013. On 1/31 she was admitted with hyperglycemia, fever and failure to thrive to [facility]. Blood cultures grew out MRSA bacteremia and she was admitted to [facility] for further evaluation.  EP has been asked to evaluate for device management.  Her device was implanted for primary prevention. By history, she has been a CRT responder. Device interrogation today demonstrates normal device function, underlying rhythm sinus brady 50's, no history of device therapy.  Echo 11-07-13 demonstrates EF 45%, mild global hypokinesis, mild AS, shadowing at level of tricuspid valve (vegetation cannot be completely excluded).  She complains of shortness of breath and fatigue. She denies chest pain, palpitations, dizziness, or syncope.    Hospital Course:  Acute respiratory failure likely related to septic emboli, uncompensated metabolic acidosis, Septic shock and HCAP:  - Intubated 2.5.2015 extubated on 2.6.2015.  - no significant SOB; patient encourage to use flutter valve and will be slowly weaned off oxygen as tolerated -CXR (2/14) with new left lung opacity and patient with low grade fever (will continue cefepime for HCAP therapy; patient will received 5 more days to complete therapy)  -feeling better today and with good O2 sat on 1L oxygen -will discharge on pulmicort -continue diuresis; now with lasix PO  -anemia and deconditioning has also played role in her SOB   Septic shock due MRSA Bacteremia/Leukocytosis/endocarditis:  - Requiring pressor on 2.5.2015-2.6.2015.  - S/p ICD and wire removal, with SVC thrombus.  - Started empirically on Vanc 2.2.2015. PICC line placed.  - Mild elevation on trops likely related to hypotension and demand ischemia.  -will need IV antibiotics until March 24; appreciate ID assistance  -no fever   Left first MTP ulceration on X-ray concern for osteomyelitis:  -Cont vanc.  - ID rec holding on MRI; since might not change  therapy  -follow wound care instructions and outpatient follow up with Dr. [name]  Nonischemic cardiomyopathy/Chronic systolic heart failure EF 45%:  - resume coreg, d/c norvasc, cont hydralazine, lasix adjusted to 40mg  BID and now imdur -no  crackles on exam today; still with trace to 1 + LE edema  - close follow up to her weight  - appriciate cardiology assistance; patient will follow with them as instructed below. -continue low sodium diet   AKI on chronic kidney disease/diabetic nephropathyHyponatremia:  -Due to septic shock  -diuresis IV and use of vancomycin might be also contribute to some worsening of renal function  -baseline Cr 1.6-1.8  -Cr On 2/16 (2.01)   Diabete II:  - cont lantus and SSI.  -patient advise to follow low carb diet  Acute encephalopathy:  -mentation back to baseline  GERD  -will continue protonix   Physical deconditioning  -SNF for physical rehab  Anemia  -initially symptomatic; patient symptoms now subsided -Hgb 8.0 (Drawn on 2/14)  -after 2 units of PRBC's on 2/12  -follow Hgb trend and transfuse if needed (goal is for Hgb > 7) -Fecal occult Blood tests negative and no overt bleeding on exam -CBC in 1 week   HTN  -BP better now; will monitor   HCAP  -patient with low grade fever and new left lung opacity  -will add cefepime to current antibiotics for her bacteremia and follow clinical response  -will also start pulmicort   Procedures and significant events: 2/1 admission to [facility] for hyperglycemia, on insulin drip  2/1 BCx with MRSA, started on vanc  2/2 ID c/s and rifampin and cefazolin added  2/3 TTE EF 45% global hypokinesis, moderate LVH, grade 1 diastolic dysfunction, shadowing at level of tricuspid valve (vegetation cannot be completely excluded).  2/4 transferred to [facility] from [facility]  2/5- removal wires, shock, ETT  2/5 pressors off  2/6 extubated  2/12  2 units of PRBC's transfused   Consultations: ID  Cardiology  PCCM  Discharge Exam: Filed Vitals:   11/20/13 1033  BP: 158/63  Pulse:   Temp:   Resp:     General: Alert, awake, oriented x3, in no acute distress after oxygen applied.  HEENT: No bruits, no goiter. no JVD  Heart: Regular rate and  rhythm, without murmurs, rubs, gallops. Trace to 1+ edema bilaterally  Lungs: Good air movement, no frank crackle on exam  Abdomen: Soft, nontender, nondistended, positive bowel sounds.    Discharge Instructions  Discharge Orders   Future Appointments Provider Department Dept Phone   11/22/2013 8:45 AM Evans Lance, MD [place] [phone]   12/05/2013 2:00 PM Michel Bickers, MD [facility] for Infectious Disease [phone]   12/18/2013 8:20 AM Satira Sark, MD [facility] [place] [place] [phone]   02/13/2014 8:45 AM Hayden Pedro, MD [place] [phone]   03/27/2014 9:30 AM [facility] [phone]   03/27/2014 10:00 AM [facility] [address] [phone]   03/27/2014 10:30 AM Rosetta Posner, MD [facility] [phone]   Future Orders Complete By Expires   Diet - low sodium heart healthy  As directed    Discharge instructions  As directed    Comments:     Take medications as prescribed Follow a low sodium diet (< 2 grams daily) Wean oxygen slowly to RA as tolerated Physical therapy as per SNF protocol BMET in 1  week to follow electrolytes and renal function       Medication List    STOP taking these medications       digoxin 0.125 MG tablet  Commonly known as:  LANOXIN     losartan 50 MG tablet  Commonly known as:  COZAAR      TAKE these medications       amLODipine 10 MG tablet  Commonly known as:  NORVASC  Take 1 tablet (10 mg total) by mouth 2 (two) times daily.     aspirin 81 MG tablet  Take 81 mg by mouth daily.     budesonide 0.25 MG/2ML nebulizer solution  Commonly known as:  PULMICORT  Take 2 mLs (0.25 mg total) by nebulization 2 (two) times daily.     carvedilol 25 MG tablet  Commonly known as:  COREG  Take 1 tablet (25 mg total) by mouth 2 (two) times daily with a meal.     dextrose 5  % SOLN 50 mL with ceFEPIme 1 G SOLR 1 g  Inject 1 g into the vein daily. Provide treatment for 5 days total     docusate sodium 100 MG capsule  Commonly known as:  COLACE  Take 100 mg by mouth daily as needed. For constipation     feeding supplement (GLUCERNA SHAKE) Liqd  Take 237 mLs by mouth daily as needed (Please offer if pt eats less than 75% of meals).     furosemide 40 MG tablet  Commonly known as:  LASIX  Take 1 tablet (40 mg total) by mouth 2 (two) times daily.     hydrALAZINE 100 MG tablet  Commonly known as:  APRESOLINE  Take 1 tablet (100 mg total) by mouth 4 (four) times daily.     insulin glargine 100 UNIT/ML injection  Commonly known as:  LANTUS  Inject 0.35 mLs (35 Units total) into the skin daily.     insulin lispro 100 UNIT/ML injection  Commonly known as:  HUMALOG  Inject 0-8 Units into the skin every evening. Sliding scale: 200-249=2 units, 250-299=4 units, 300-349=6 units, >350=8 units     isosorbide mononitrate 30 MG 24 hr tablet  Commonly known as:  IMDUR  Take 1 tablet (30 mg total) by mouth daily.     nitroGLYCERIN 0.4 MG SL tablet  Commonly known as:  NITROSTAT  Place 0.4 mg under the tongue every 5 (five) minutes as needed. chest pain     pantoprazole 40 MG tablet  Commonly known as:  PROTONIX  Take 1 tablet (40 mg total) by mouth 2 (two) times daily.     sodium chloride 0.9 % SOLN 250 mL with vancomycin 10 G SOLR 1,500 mg  Inject 1,500 mg into the vein every other day. Vanc through goal 15-20; follow every 5 days and adjust medication if needed. Plan is to treat for a total of 6 weeks (last day December 26, 2013)  Start taking on:  11/21/2013     traMADol 50 MG tablet  Commonly known as:  ULTRAM  Take 1 tablet (50 mg total) by mouth every 4 (four) hours as needed for moderate pain.       Allergies  Allergen Reactions  . Hydrocodone-Acetaminophen Other (See Comments)    hallucinations       Follow-up Information   Follow up with  [name], MD In 1 week.   Specialty:  Family Medicine   Contact information:   [place] [address] [phone]  Call [name], MD. (to set up appointment)    Specialty:  Cardiology   Contact information:   [id number] [place] [address] [phone]       The results of significant diagnostics from this hospitalization (including imaging, microbiology, ancillary and laboratory) are listed below for reference.    Significant Diagnostic Studies: Dg Chest 2 View  11/18/2013   CLINICAL DATA:  Shortness of breath.  Weakness.  EXAM: CHEST  2 VIEW  COMPARISON:  DG CHEST 1V PORT dated 11/10/2013; DG CHEST 1V PORT dated 11/09/2013; DG CHEST 2 VIEW dated 11/04/2013; DG CHEST 2 VIEW dated 11/10/2012; DG CHEST 2 VIEW dated 05/27/2011  FINDINGS: Interval extubation. Right upper extremity PICC line is present with tip projecting over the superior vena cava. Stable cardiomegaly. Interval development of consolidative opacity within the left upper lung. Right lung is well aerated. No pleural effusion or pneumothorax.  IMPRESSION: New consolidative opacity within the left upper lung may represent pneumonia. Recommend short-term followup radiograph to ensure resolution.   Electronically Signed   By: Lovey Newcomer M.D.   On: 11/18/2013 12:31   Dg Chest 2 View  11/04/2013   CLINICAL DATA:  Weakness  EXAM: CHEST - 2 VIEW  COMPARISON:  05/09/2013  FINDINGS: Stable cardiomegaly. Lungs clear. Stable left subclavian AICD. Orthopedic anchor in the right humeral head. . No effusion.  IMPRESSION: Stable cardiomegaly and postop change.  No acute disease.   Electronically Signed   By: Arne Cleveland M.D.   On: 11/04/2013 09:43   Dg Chest Port 1 View  11/10/2013   CLINICAL DATA:  Respiratory distress, intubated  EXAM: PORTABLE CHEST - 1 VIEW  COMPARISON:  Portable exam 0709 hr compared to 11/09/2013  FINDINGS: Tip of endotracheal tube projects 2.1 cm above carinal.   Nasogastric tube extends into stomach.  Right arm PICC line tip projects over SVC above cavoatrial junction.  External pacing leads and EKG leads are superimposed with the chest.  Enlargement of cardiac silhouette with pulmonary vascular congestion.  Mild perihilar infiltrates bilaterally.  No gross pleural effusion or pneumothorax.  IMPRESSION: Enlargement of cardiac silhouette with pulmonary vascular congestion mild perihilar infiltrates question edema.   Electronically Signed   By: Lavonia Dana M.D.   On: 11/10/2013 08:03   Dg Chest Port 1 View  11/09/2013   CLINICAL DATA:  Hypoxia  EXAM: PORTABLE CHEST - 1 VIEW  COMPARISON:  November 06, 2013  FINDINGS: Endotracheal tube tip is 3.2 cm above the carina. Central catheter tip is in the superior vena cava near the junction with the right atrium. Nasogastric tube is coiled in the stomach. Pacemaker no longer appreciable. No pneumothorax.  There is no edema or consolidation. Heart is moderately enlarged with normal pulmonary vascularity, stable. No adenopathy.  IMPRESSION: Tube and catheter positions as described without pneumothorax. Stable cardiac enlargement. No edema or consolidation.   Electronically Signed   By: Lowella Grip M.D.   On: 11/09/2013 10:21   Dg Chest Port 1 View  11/06/2013   CLINICAL DATA:  Status post PICC line placement  EXAM: PORTABLE CHEST - 1 VIEW  COMPARISON:  11/04/2013  FINDINGS: Cardiac shadow is stable. A defibrillator is again seen. A new right-sided PICC line is noted with the catheter tip in the mid right atrium. The lungs are clear. No acute bony abnormality is noted.  IMPRESSION: New right PICC line with the tip in the mid right atrium.   Electronically Signed   By: Elta Guadeloupe  Lukens M.D.   On: 11/06/2013 15:02   Dg Knee Right Port  11/07/2013   CLINICAL DATA:  Pain and swelling  EXAM: PORTABLE RIGHT KNEE - 1-2 VIEW  COMPARISON:  None.  FINDINGS: Frontal and lateral views were obtained. There is a small joint effusion. No  fracture or dislocation. There is mild generalized joint space narrowing. No erosive change or bony destruction. There is extensive arterial vascular calcification.  IMPRESSION: Multifocal osteoarthritic change. Small joint effusion. No fracture or bony destruction. There is extensive arterial vascular calcification.   Electronically Signed   By: Lowella Grip M.D.   On: 11/07/2013 08:29   Dg Abd Portable 1v  11/09/2013   CLINICAL DATA:  Orogastric tube placement.  EXAM: PORTABLE ABDOMEN - 1 VIEW  COMPARISON:  02/20/2011  FINDINGS: Nasogastric tube curls within the stomach, well positioned.  There is a femoral a placed right iliac vein introducer sheath.  Soft tissues are otherwise unremarkable.  Normal bowel gas pattern.  IMPRESSION: Nasogastric tube is well positioned curling within the stomach.   Electronically Signed   By: Lajean Manes M.D.   On: 11/09/2013 10:27   Dg Foot 2 Views Left  11/08/2013   CLINICAL DATA:  Chronic ulcer  EXAM: LEFT FOOT - 2 VIEW  COMPARISON:  None.  FINDINGS: There is a skin defect medial to the head of the first metatarsal. There is cortical demineralization in the adjacent head of the first metatarsal. No definite involvement of the subchondral cortex or MTP joint. There is a hallux valgus deformity. Negative for fracture. Otherwise normal mineralization and alignment. Small calcaneal spur at the plantar aponeurosis.  IMPRESSION: 1. Demineralization in the lateral aspect head first metatarsal, with an overlying soft tissue defect, suggesting osteomyelitis.   Electronically Signed   By: Arne Cleveland M.D.   On: 11/08/2013 21:46   Labs: Basic Metabolic Panel:  Recent Labs Lab 11/14/13 0406 11/15/13 1145 11/17/13 0600 11/18/13 0500  NA 133* 136* 135* 136*  K 3.9 3.8 3.7 3.7  CL 97 99 99 99  CO2 21 22 23 22   GLUCOSE 164* 157* 102* 106*  BUN 59* 55* 49* 47*  CREATININE 1.76* 1.91* 2.02* 2.01*  CALCIUM 7.6* 7.6* 7.8* 7.6*   CBC:  Recent Labs Lab  11/15/13 1145 11/17/13 0200 11/18/13 0500 11/20/13 0400  WBC 14.9* 14.9* 12.8* 9.0  HGB 7.3* 8.9* 8.0* 7.7*  HCT 20.7* 25.1* 23.5* 22.8*  MCV 84.5 83.7 85.8 87.0  PLT 234 246 224 236   BNP: BNP (last 3 results)  Recent Labs  11/18/13 0500  PROBNP 9424.0*   CBG:  Recent Labs Lab 11/19/13 1142 11/19/13 1624 11/19/13 2206 11/20/13 0554 11/20/13 1121  GLUCAP 192* 166* 92 104* 142*    Signed:  Luvern Mischke  [facility] 11/20/2013, 12:33 PM

## 2013-11-20 NOTE — Clinical Social Work Placement (Signed)
     Clinical Social Work Department CLINICAL SOCIAL WORK PLACEMENT NOTE 11/20/2013  Patient:  [name], [name]  Account Number:  [id number] Admit date:  11/04/2013  Clinical Social Worker:  Butch Penny Julie Paolini, LCSWA  Date/time:  11/17/2013 12:45 PM  Clinical Social Work is seeking post-discharge placement for this patient at the following level of care:   SKILLED NURSING   (*CSW will update this form in Epic as items are completed)   11/17/2013  Patient/family provided with [place] Department of Clinical Social Works list of facilities offering this level of care within the geographic area requested by the patient (or if unable, by the patients family).  11/17/2013  Patient/family informed of their freedom to choose among providers that offer the needed level of care, that participate in Medicare, Medicaid or managed care program needed by the patient, have an available bed and are willing to accept the patient.  11/17/2013  Patient/family informed of [facility] ownership interest in [facility], as well as of the fact that they are under no obligation to receive care at this facility.  PASARR submitted to EDS on 11/20/2013 PASARR number received from EDS on 11/20/2013  FL2 transmitted to all facilities in geographic area requested by pt/family on  11/17/2013 FL2 transmitted to all facilities within larger geographic area on   Patient informed that his/her managed care company has contracts with or will negotiate with  certain facilities, including the following:   NA     Patient/family informed of bed offers received:  11/20/2013 Patient chooses bed at [facility] SNF Physician recommends and patient chooses bed at    Patient to be transferred to [place] on  11/20/2013 Patient to be transferred to facility by Ambulance  Corey Harold)  The following physician request were entered in Epic:   Additional Comments: 11/20/13  Ok for d/c today to SNF.  Patient is pleased that a private room became available at [facility].  She is extremely dissappointed that her family is unable to make arrangements to provide her IV antibiotics at home and family reports that she is "upset" with her son. CSW provided support to patient  and she verbalized that she feels better knowing that she will only have to be in the SNF until March 24th (the end of need if IV antibiotics). CSW signing off.  Lorie Phenix. [place], [place]

## 2013-11-22 ENCOUNTER — Encounter: Payer: Medicare Other | Admitting: Internal Medicine

## 2013-11-22 ENCOUNTER — Telehealth: Payer: Self-pay | Admitting: Internal Medicine

## 2013-11-22 ENCOUNTER — Ambulatory Visit: Payer: Medicare Other

## 2013-11-22 NOTE — Telephone Encounter (Signed)
Patient is at [facility] and transportation was unable to accommodate today's appointment.  We rescheduled her for 2/19 @ 2pm.

## 2013-11-22 NOTE — Telephone Encounter (Signed)
New problem,     [name] called for Rn to find out if they can do her wound check post extraction.needs sutures removed at the facility.  If so can you please give them a call verbal or fax it in please.

## 2013-11-23 ENCOUNTER — Ambulatory Visit: Payer: Medicare Other | Admitting: *Deleted

## 2013-11-23 DIAGNOSIS — I330 Acute and subacute infective endocarditis: Secondary | ICD-10-CM

## 2013-11-23 DIAGNOSIS — I429 Cardiomyopathy, unspecified: Secondary | ICD-10-CM

## 2013-11-24 NOTE — Progress Notes (Signed)
Patient presents to the office for a wound check s/p device extraction due to infection. Wound well healed without redness or edema. All stitches removed---GT approved. Patient educated about wound care and signs of infection. Patient will follow up with GT/R in 4 weeks to discuss possible reimplant of device.

## 2013-12-05 ENCOUNTER — Ambulatory Visit (INDEPENDENT_AMBULATORY_CARE_PROVIDER_SITE_OTHER): Payer: Medicare Other | Admitting: Internal Medicine

## 2013-12-05 ENCOUNTER — Encounter: Payer: Self-pay | Admitting: Internal Medicine

## 2013-12-05 VITALS — BP 148/68 | HR 76 | Temp 98.6°F | Ht 61.0 in | Wt 193.8 lb

## 2013-12-05 DIAGNOSIS — I330 Acute and subacute infective endocarditis: Secondary | ICD-10-CM

## 2013-12-05 DIAGNOSIS — A4902 Methicillin resistant Staphylococcus aureus infection, unspecified site: Secondary | ICD-10-CM

## 2013-12-05 DIAGNOSIS — I70219 Atherosclerosis of native arteries of extremities with intermittent claudication, unspecified extremity: Secondary | ICD-10-CM

## 2013-12-05 DIAGNOSIS — R7881 Bacteremia: Secondary | ICD-10-CM

## 2013-12-05 NOTE — Progress Notes (Addendum)
Patient ID: [name], female   DOB: [birth year], 68 y.o.   MRN: [id number]         [facility]  Patient Active Problem List   Diagnosis Date Noted  . Bacterial endocarditis 11/14/2013    Priority: High  . MRSA bacteremia 11/08/2013    Priority: High  . Biventricular implantable cardioverter-defibrillator in situ 05/27/2011    Priority: Medium  . Septic shock(785.52) 11/09/2013  . Acute respiratory failure with hypoxia 11/09/2013  . Type 2 diabetes mellitus 11/08/2013  . Normocytic anemia 11/08/2013  . DJD (degenerative joint disease) 11/08/2013  . Foot ulcer, left 11/08/2013  . Hyperglycemia 11/04/2013  . Macular hole 10/12/2012  . Proliferative diabetic retinopathy 10/12/2012  . Atherosclerosis of native arteries of the extremities with intermittent claudication 03/22/2012  . PAD (peripheral artery disease) 04/30/2011  . HYPERLIPIDEMIA 12/31/2009  . CORONARY ATHEROSCLEROSIS NATIVE CORONARY ARTERY 12/31/2009  . Nonischemic cardiomyopathy 12/31/2009  . Chronic systolic heart failure 0000000  . Essential hypertension, benign 12/05/2009  . LEFT BUNDLE BRANCH BLOCK 12/05/2009    Patient's Medications  New Prescriptions   No medications on file  Previous Medications   ACETAMINOPHEN PO    Take by mouth as needed.   AMLODIPINE (NORVASC) 10 MG TABLET    Take 1 tablet (10 mg total) by mouth 2 (two) times daily.   ASPIRIN 81 MG TABLET    Take 81 mg by mouth daily.    BUDESONIDE (PULMICORT) 0.25 MG/2ML NEBULIZER SOLUTION    Take 2 mLs (0.25 mg total) by nebulization 2 (two) times daily.   CALCIUM CARBONATE-VITAMIN D (CALCIUM-VITAMIN D) 500-200 MG-UNIT PER TABLET    Take 1 tablet by mouth 2 (two) times daily.   CARVEDILOL (COREG) 25 MG TABLET    Take 1 tablet (25 mg total) by mouth 2 (two) times daily with a meal.   CHOLECALCIFEROL (VITAMIN D-3) 1000 UNITS CAPS    Take 1,000 Units by mouth daily.   DOCUSATE SODIUM (COLACE) 100 MG CAPSULE    Take  100 mg by mouth daily as needed. For constipation   FEEDING SUPPLEMENT, GLUCERNA SHAKE, (GLUCERNA SHAKE) LIQD    Take 237 mLs by mouth daily as needed (Please offer if pt eats less than 75% of meals).   FUROSEMIDE (LASIX) 40 MG TABLET    Take 1 tablet (40 mg total) by mouth 2 (two) times daily.   HYDRALAZINE (APRESOLINE) 100 MG TABLET    Take 1 tablet (100 mg total) by mouth 4 (four) times daily.   INSULIN GLARGINE (LANTUS) 100 UNIT/ML INJECTION    Inject 0.35 mLs (35 Units total) into the skin daily.   INSULIN LISPRO (HUMALOG) 100 UNIT/ML INJECTION    Inject 0-8 Units into the skin every evening. Sliding scale: 200-249=2 units, 250-299=4 units, 300-349=6 units, >350=8 units   ISOSORBIDE MONONITRATE (IMDUR) 30 MG 24 HR TABLET    Take 1 tablet (30 mg total) by mouth daily.   MULTIPLE VITAMINS-MINERALS (MULTIVITAMIN WITH MINERALS) TABLET    Take 1 tablet by mouth daily.   NITROGLYCERIN (NITROSTAT) 0.4 MG SL TABLET    Place 0.4 mg under the tongue every 5 (five) minutes as needed. chest pain   PANTOPRAZOLE (PROTONIX) 40 MG TABLET    Take 1 tablet (40 mg total) by mouth 2 (two) times daily.   SACCHAROMYCES BOULARDII (FLORASTOR) 250 MG CAPSULE    Take 250 mg by mouth 2 (two) times daily.   SODIUM CHLORIDE 0.9 % SOLN 250 ML WITH VANCOMYCIN  10 G SOLR 1,500 MG    Inject 1,500 mg into the vein every other day. Vanc through goal 15-20; follow every 5 days and adjust medication if needed. Plan is to treat for a total of 6 weeks (last day December 26, 2013)   TRAMADOL (ULTRAM) 50 MG TABLET    Take 1 tablet (50 mg total) by mouth every 4 (four) hours as needed for moderate pain.   VITAMIN C (ASCORBIC ACID) 500 MG TABLET    Take 500 mg by mouth daily.   ZINC GLUCONATE 50 MG TABLET    Take 50 mg by mouth daily.  Modified Medications   No medications on file  Discontinued Medications   DEXTROSE 5 % SOLN 50 ML WITH CEFEPIME 1 G SOLR 1 G    Inject 1 g into the vein daily. Provide treatment for 5 days total     Subjective: [name] is in for her hospital followup visit with her son. She was hospitalized last month with MRSA bacteremia complicated by tricuspid valve endocarditis and a vegetation on her ICD leads. Repeat blood cultures became negative. The ICD leads were removed. She did have some showering of pulmonary emboli during device removal but improved and was discharged on IV vancomycin. He has now completed this 31 days of antibiotic therapy and is feeling much better. She's not having any significant fever, chills or sweats. She has no cough or unusual shortness of breath. Her appetite is improving. She has not had any problems tolerating her PICC or vancomycin. Review of Systems: Pertinent items are noted in HPI.  Past Medical History  Diagnosis Date  . Diabetes mellitus, type 2   . Essential hypertension, benign   . Chronic systolic heart failure   . Coronary atherosclerosis of native coronary artery     Nonobstructive  . Nonischemic cardiomyopathy     LVEF 20%  . PAD (peripheral artery disease)     Left foot gangrene  . Anxiety   . Depression   . ICD (implantable cardiac defibrillator) in place     History  Substance Use Topics  . Smoking status: Never Smoker   . Smokeless tobacco: Never Used     Comment: tobacco use-no  . Alcohol Use: No    Family History  Problem Relation Age of Onset  . Cirrhosis Father   . Cancer Mother     Ovarian  . Ovarian cancer Mother     Allergies  Allergen Reactions  . Hydrocodone-Acetaminophen Other (See Comments)    hallucinations    Objective: Temp: 98.6 F (37 C) (03/03 1400) Temp src: Oral (03/03 1400) BP: 148/68 mmHg (03/03 1400) Pulse Rate: 76 (03/03 1400)  General: She is alert and in no distress seated in her wheelchair. Skin:  Right arm PICC site appears normal Lungs: Clear Cor: Regular S1 and S2 with an early 1/6 systolic murmur heard best at the right upper sternal border Joints extremities: The ulcer on  the medial aspect of her left first MTP joint is much smaller and without drainage  I not been sent the results of any blood work done since discharge to [facility]   Assessment: [name] is improving clinically on therapy for MRSA bacteremia and endocarditis.  Plan: 1. Continue IV vancomycin a total of 6 weeks through March 14 2. Remove PICC after completing IV vancomycin on March 14 3. Followup here in 6 weeks   Michel Bickers, MD [facility] [phone]  pager   [phone] cell 12/05/2013, 2:24 PM

## 2013-12-18 ENCOUNTER — Other Ambulatory Visit: Payer: Self-pay

## 2013-12-18 ENCOUNTER — Ambulatory Visit (INDEPENDENT_AMBULATORY_CARE_PROVIDER_SITE_OTHER): Payer: Medicare Other | Admitting: Cardiology

## 2013-12-18 ENCOUNTER — Telehealth: Payer: Self-pay

## 2013-12-18 ENCOUNTER — Encounter: Payer: Self-pay | Admitting: Cardiology

## 2013-12-18 VITALS — BP 141/51 | HR 74 | Ht 61.0 in | Wt 202.0 lb

## 2013-12-18 DIAGNOSIS — N183 Chronic kidney disease, stage 3 unspecified: Secondary | ICD-10-CM | POA: Insufficient documentation

## 2013-12-18 DIAGNOSIS — I5022 Chronic systolic (congestive) heart failure: Secondary | ICD-10-CM

## 2013-12-18 DIAGNOSIS — A4902 Methicillin resistant Staphylococcus aureus infection, unspecified site: Secondary | ICD-10-CM

## 2013-12-18 DIAGNOSIS — I70219 Atherosclerosis of native arteries of extremities with intermittent claudication, unspecified extremity: Secondary | ICD-10-CM

## 2013-12-18 DIAGNOSIS — I428 Other cardiomyopathies: Secondary | ICD-10-CM

## 2013-12-18 DIAGNOSIS — I10 Essential (primary) hypertension: Secondary | ICD-10-CM

## 2013-12-18 DIAGNOSIS — R7881 Bacteremia: Secondary | ICD-10-CM

## 2013-12-18 MED ORDER — FUROSEMIDE 80 MG PO TABS: 80.0000 mg | ORAL_TABLET | Freq: Two times a day (BID) | ORAL | Status: DC

## 2013-12-18 NOTE — Assessment & Plan Note (Signed)
Patient's weight has increased substantially since I last saw her in December 2014, interval severe illness as outlined above. For now we will increase Lasix to 80 mg twice daily, followup BMET, determine if potassium supplement is necessary. She was not discharged on potassium. I am hopeful that we will be able to get her volume status improved on oral therapy, if not she may require hospitalization for IV diuretics.

## 2013-12-18 NOTE — Progress Notes (Signed)
Clinical Summary [name] is a 68 y.o.female last seen in December 2014. She was clinically stable at that time on medical therapy. Records were reviewed finding hospitalization in January of this year with MRSA bacteremia and respiratory failure with shock. She had evidence of possible tricuspid valve vegetation and did undergo extraction of her ICD system in February 5. She has been on IV antibiotics managed by the ID service. She just recently had a followup visit with Dr. Megan Salon, and completed IV vancomycin on March 14. She is undergoing rehabilitation at [facility].  She states that she is slowly improving her strength. Has gained a significant amount of weight and looks much more edematous than when I last saw her. Also having itching and I suspect some effects of being on vancomycin based on the appearance of her skin. Her weight was listed at 179 pounds in mid February, now up to 200 pounds. She has been on Lasix 40 mg 3 times a day at the nursing center with an additional dose for weight gain. She also tells me that she has been on a 1600 cc fluid restriction.  Echocardiogram from February 3 demonstrated moderate LVH with LVEF approximately [id number], grade 1 diastolic dysfunction, mild aortic stenosis with mean gradient 14 mm mercury, mildly reduced RV contraction.  Lab work from February showed potassium 3.7, BUN 47, creatinine 2.0. I do not have any other recent lab work.   Allergies  Allergen Reactions  . Hydrocodone-Acetaminophen Other (See Comments)    hallucinations    Current Outpatient Prescriptions  Medication Sig Dispense Refill  . ACETAMINOPHEN PO Take by mouth as needed.      Marland Kitchen amLODipine (NORVASC) 10 MG tablet Take 1 tablet (10 mg total) by mouth 2 (two) times daily.  60 tablet  6  . aspirin 81 MG tablet Take 81 mg by mouth daily.       . budesonide (PULMICORT) 0.25 MG/2ML nebulizer solution Take 2 mLs (0.25 mg total) by nebulization 2 (two) times  daily.  60 mL  12  . Calcium Carbonate-Vitamin D (CALCIUM-VITAMIN D) 500-200 MG-UNIT per tablet Take 1 tablet by mouth 2 (two) times daily.      . carvedilol (COREG) 25 MG tablet Take 1 tablet (25 mg total) by mouth 2 (two) times daily with a meal.  60 tablet  6  . Cholecalciferol (VITAMIN D-3) 1000 UNITS CAPS Take 1,000 Units by mouth daily.      Marland Kitchen docusate sodium (COLACE) 100 MG capsule Take 100 mg by mouth daily as needed. For constipation      . feeding supplement, GLUCERNA SHAKE, (GLUCERNA SHAKE) LIQD Take 237 mLs by mouth daily as needed (Please offer if pt eats less than 75% of meals).    0  . hydrALAZINE (APRESOLINE) 100 MG tablet Take 1 tablet (100 mg total) by mouth 4 (four) times daily.  90 tablet  5  . insulin glargine (LANTUS) 100 UNIT/ML injection Inject 0.35 mLs (35 Units total) into the skin daily.  10 mL  11  . insulin lispro (HUMALOG) 100 UNIT/ML injection Inject 0-8 Units into the skin every evening. Sliding scale: 200-249=2 units, 250-299=4 units, 300-349=6 units, >350=8 units      . isosorbide mononitrate (IMDUR) 30 MG 24 hr tablet Take 1 tablet (30 mg total) by mouth daily.      . Multiple Vitamins-Minerals (MULTIVITAMIN WITH MINERALS) tablet Take 1 tablet by mouth daily.      . nitroGLYCERIN (NITROSTAT) 0.4 MG SL  tablet Place 0.4 mg under the tongue every 5 (five) minutes as needed. chest pain      . pantoprazole (PROTONIX) 40 MG tablet Take 1 tablet (40 mg total) by mouth 2 (two) times daily.      Marland Kitchen saccharomyces boulardii (FLORASTOR) 250 MG capsule Take 250 mg by mouth 2 (two) times daily.      . sodium chloride 0.9 % SOLN 250 mL with vancomycin 10 G SOLR 1,500 mg Inject 1,500 mg into the vein every other day. Vanc through goal 15-20; follow every 5 days and adjust medication if needed. Plan is to treat for a total of 6 weeks (last day December 26, 2013)    0  . traMADol (ULTRAM) 50 MG tablet Take 1 tablet (50 mg total) by mouth every 4 (four) hours as needed for moderate pain.   30 tablet  0  . vitamin C (ASCORBIC ACID) 500 MG tablet Take 500 mg by mouth daily.      Marland Kitchen zinc gluconate 50 MG tablet Take 50 mg by mouth daily.      . furosemide (LASIX) 80 MG tablet Take 1 tablet (80 mg total) by mouth 2 (two) times daily.  90 tablet  3   No current facility-administered medications for this visit.    Past Medical History  Diagnosis Date  . Diabetes mellitus, type 2   . Essential hypertension, benign   . Chronic systolic heart failure   . Coronary atherosclerosis of native coronary artery     Nonobstructive  . Nonischemic cardiomyopathy     LVEF 20% up to 45%  . PAD (peripheral artery disease)     Left foot gangrene  . Anxiety   . Depression   . MRSA bacteremia January 2015    Associated with probable tricuspid valve vegetation, ICD system extracted February 2050    Social History [name] reports that she has never smoked. She has never used smokeless tobacco. [name] reports that she does not drink alcohol.  Review of Systems Appetite is much better by report. Legs have been tight and more swollen. No fevers or chills. Otherwise as outlined above.  Physical Examination Filed Vitals:   12/18/13 0825  BP: 141/51  Pulse: 74   Filed Weights   12/18/13 0825  Weight: 202 lb (91.627 kg)    [name], sitting in wheelchair.  HEENT: Conjunctiva and lids normal, oropharynx clear.  Neck: Increased girth with elevated JVP, no carotid bruis, no thyromegaly.  Lungs: Clear with diminished breath sounds, decreased at bases, nonlabored.  Cardiac: Indistinct PMI, regular rate and rhythm, 2/6 systolic murmur at the base, paradoxically split S2, no S3.  Abdomen: Protuberant. Extremities: Firm, 3+ edema, left greater than right, diminished distal pulses..  Skin: Dry with some areas of sloughing. Musculoskeletal: No kyphosis. Neuropsychiatric: Alert and oriented x3, affect appropriate.   Problem List and Plan   Chronic systolic  heart failure Patient's weight has increased substantially since I last saw her in December 2014, interval severe illness as outlined above. For now we will increase Lasix to 80 mg twice daily, followup BMET, determine if potassium supplement is necessary. She was not discharged on potassium. I am hopeful that we will be able to get her volume status improved on oral therapy, if not she may require hospitalization for IV diuretics.  MRSA bacteremia Status post prolonged course of IV vancomycin, managed by the ID service, recent visit with Dr. Megan Salon noted. Patient had evidence of probable tricuspid valve vegetation  and underwent extraction of her ICD system as detailed above.  Nonischemic cardiomyopathy LVEF was approximately 45% by echocardiogram in February. Continue medical therapy. Main issue is improving volume status at this time.  CKD (chronic kidney disease) stage 3, GFR 30-59 ml/min Creatinine 2.0 in February. Followup BMET.    Satira Sark, M.D., F.A.C.C.

## 2013-12-18 NOTE — Assessment & Plan Note (Signed)
Status post prolonged course of IV vancomycin, managed by the ID service, recent visit with Dr. Megan Salon noted. Patient had evidence of probable tricuspid valve vegetation and underwent extraction of her ICD system as detailed above.

## 2013-12-18 NOTE — Telephone Encounter (Signed)
Fax lab request to [facility] Repeat BMET before next OV

## 2013-12-18 NOTE — Patient Instructions (Addendum)
Your physician recommends that you schedule a follow-up appointment in: 1 week with Dr.McDowell   Please get blood work done [employer])   Your physician has recommended you make the following change in your medication:    INCREASE Lasix to 80 mg twice a day     Thank you for choosing [facility] !

## 2013-12-18 NOTE — Assessment & Plan Note (Signed)
LVEF was approximately 45% by echocardiogram in February. Continue medical therapy. Main issue is improving volume status at this time.

## 2013-12-18 NOTE — Assessment & Plan Note (Signed)
Creatinine 2.0 in February. Followup BMET.

## 2013-12-19 ENCOUNTER — Encounter: Payer: Self-pay | Admitting: Cardiology

## 2013-12-26 ENCOUNTER — Ambulatory Visit (INDEPENDENT_AMBULATORY_CARE_PROVIDER_SITE_OTHER): Payer: Medicare Other | Admitting: Cardiology

## 2013-12-26 ENCOUNTER — Encounter: Payer: Self-pay | Admitting: *Deleted

## 2013-12-26 ENCOUNTER — Encounter: Payer: Self-pay | Admitting: Cardiology

## 2013-12-26 VITALS — BP 139/59 | HR 70 | Wt 181.0 lb

## 2013-12-26 DIAGNOSIS — I70219 Atherosclerosis of native arteries of extremities with intermittent claudication, unspecified extremity: Secondary | ICD-10-CM

## 2013-12-26 DIAGNOSIS — N183 Chronic kidney disease, stage 3 unspecified: Secondary | ICD-10-CM

## 2013-12-26 DIAGNOSIS — I5022 Chronic systolic (congestive) heart failure: Secondary | ICD-10-CM

## 2013-12-26 DIAGNOSIS — I10 Essential (primary) hypertension: Secondary | ICD-10-CM

## 2013-12-26 DIAGNOSIS — I330 Acute and subacute infective endocarditis: Secondary | ICD-10-CM

## 2013-12-26 DIAGNOSIS — I428 Other cardiomyopathies: Secondary | ICD-10-CM

## 2013-12-26 MED ORDER — ISOSORBIDE MONONITRATE ER 60 MG PO TB24: 60.0000 mg | ORAL_TABLET | Freq: Every day | ORAL | Status: DC

## 2013-12-26 NOTE — Assessment & Plan Note (Signed)
Tricuspid valve vegetation, ICD extraction, status post IV antibiotics. Suspect PICC line can be removed.

## 2013-12-26 NOTE — Assessment & Plan Note (Signed)
She is improving clinically, has lost nearly 20 pounds of fluid weight since I saw her a week ago. Renal function has tolerated this, actually with an improvement in BUN and creatinine. Plan is to continue Lasix at present dose, followup BMET for repeat visit in one to 2 weeks. No other change to current cardiac regimen.

## 2013-12-26 NOTE — Patient Instructions (Signed)
Your physician recommends that you schedule a follow-up appointment in: 1-2 weeks   Your physician recommends that you continue on your current medications as directed. Please refer to the Current Medication list given to you today.  Please have BMET drawn before next visit    Thank you for choosing [facility] !

## 2013-12-26 NOTE — Progress Notes (Signed)
Clinical Summary [name] is a 68 y.o.female recently seen for followup, complex interval history noted in the prior encounter. At that time she was noted to be significantly volume overloaded, and Lasix was increased to 80 mg twice daily. She had recently completed a course of IV vancomycin per the ID service for tricuspid valve vegetation, including extraction of her ICD system. She continues to undergo rehabilitation at the [facility]. She is here with her son today. She looks much better, has lost nearly 20 pounds since I saw her. She states that she is progressing well with rehabilitation, walked 280 feet today. She still has a Foley catheter in place as well as supplemental oxygen.  Lab work from March 16 showed BUN 91, creatinine 2.2, potassium 4.2. Today followup lab work showed BUN 73, creatinine 1.8, potassium 3.8, sodium 142. She still has pending visit with nephrology.  Echocardiogram from February 3 demonstrated moderate LVH with LVEF approximately [id number], grade 1 diastolic dysfunction, mild aortic stenosis with mean gradient 14 mm mercury, mildly reduced RV contraction.   Allergies  Allergen Reactions  . Hydrocodone-Acetaminophen Other (See Comments)    hallucinations    Current Outpatient Prescriptions  Medication Sig Dispense Refill  . ACETAMINOPHEN PO Take by mouth as needed.      Marland Kitchen amLODipine (NORVASC) 10 MG tablet Take 1 tablet (10 mg total) by mouth 2 (two) times daily.  60 tablet  6  . aspirin 81 MG tablet Take 81 mg by mouth daily.       . budesonide (PULMICORT) 0.25 MG/2ML nebulizer solution Take 2 mLs (0.25 mg total) by nebulization 2 (two) times daily.  60 mL  12  . Calcium Carbonate-Vitamin D (CALCIUM-VITAMIN D) 500-200 MG-UNIT per tablet Take 1 tablet by mouth 2 (two) times daily.      . carvedilol (COREG) 25 MG tablet Take 1 tablet (25 mg total) by mouth 2 (two) times daily with a meal.  60 tablet  6  . Cholecalciferol (VITAMIN D-3) 1000 UNITS  CAPS Take 1,000 Units by mouth daily.      Marland Kitchen docusate sodium (COLACE) 100 MG capsule Take 100 mg by mouth daily as needed. For constipation      . feeding supplement, GLUCERNA SHAKE, (GLUCERNA SHAKE) LIQD Take 237 mLs by mouth daily as needed (Please offer if pt eats less than 75% of meals).    0  . furosemide (LASIX) 80 MG tablet Take 1 tablet (80 mg total) by mouth 2 (two) times daily.  90 tablet  3  . hydrALAZINE (APRESOLINE) 100 MG tablet Take 1 tablet (100 mg total) by mouth 4 (four) times daily.  90 tablet  5  . insulin glargine (LANTUS) 100 UNIT/ML injection Inject 0.35 mLs (35 Units total) into the skin daily.  10 mL  11  . insulin lispro (HUMALOG) 100 UNIT/ML injection Inject 0-8 Units into the skin every evening. Sliding scale: 200-249=2 units, 250-299=4 units, 300-349=6 units, >350=8 units      . isosorbide mononitrate (IMDUR) 60 MG 24 hr tablet Take 1 tablet (60 mg total) by mouth daily.      . Multiple Vitamins-Minerals (MULTIVITAMIN WITH MINERALS) tablet Take 1 tablet by mouth daily.      . nitroGLYCERIN (NITROSTAT) 0.4 MG SL tablet Place 0.4 mg under the tongue every 5 (five) minutes as needed. chest pain      . pantoprazole (PROTONIX) 40 MG tablet Take 1 tablet (40 mg total) by mouth 2 (two) times daily.      Marland Kitchen  saccharomyces boulardii (FLORASTOR) 250 MG capsule Take 250 mg by mouth 2 (two) times daily.      . sodium chloride 0.9 % SOLN 250 mL with vancomycin 10 G SOLR 1,500 mg Inject 1,500 mg into the vein every other day. Vanc through goal 15-20; follow every 5 days and adjust medication if needed. Plan is to treat for a total of 6 weeks (last day December 26, 2013)    0  . traMADol (ULTRAM) 50 MG tablet Take 1 tablet (50 mg total) by mouth every 4 (four) hours as needed for moderate pain.  30 tablet  0  . vitamin C (ASCORBIC ACID) 500 MG tablet Take 500 mg by mouth daily.      Marland Kitchen zinc gluconate 50 MG tablet Take 50 mg by mouth daily.       No current facility-administered medications  for this visit.    Past Medical History  Diagnosis Date  . Diabetes mellitus, type 2   . Essential hypertension, benign   . Chronic systolic heart failure   . Coronary atherosclerosis of native coronary artery     Nonobstructive  . Nonischemic cardiomyopathy     LVEF 20% up to 45%  . PAD (peripheral artery disease)     Left foot gangrene  . Anxiety   . Depression   . MRSA bacteremia January 2015    Associated with probable tricuspid valve vegetation, ICD system extracted February 2050    Social History [name] reports that she has never smoked. She has never used smokeless tobacco. [name] reports that she does not drink alcohol.  Review of Systems No fevers or chills, no palpitations, no chest pain, improving energy and shortness of breath. Skin remains dry and flaking.  Physical Examination Filed Vitals:   12/26/13 1254  BP: 139/59  Pulse: 70   Filed Weights   12/26/13 1254  Weight: 181 lb (82.101 kg)    [name], sitting in wheelchair.  HEENT: Conjunctiva and lids normal, oropharynx clear.  Neck: Increased girth with elevated JVP, no carotid bruis, no thyromegaly.  Lungs: Clear with diminished breath sounds, decreased at bases, nonlabored.  Cardiac: Indistinct PMI, regular rate and rhythm, 2/6 systolic murmur at the base, paradoxically split S2, no S3.  Abdomen: Protuberant.  Extremities: Improving edema, diminished distal pulses..  Skin: Dry with flaking.  Musculoskeletal: No kyphosis.  Neuropsychiatric: Alert and oriented x3, affect appropriate.   Problem List and Plan   Chronic systolic heart failure She is improving clinically, has lost nearly 20 pounds of fluid weight since I saw her a week ago. Renal function has tolerated this, actually with an improvement in BUN and creatinine. Plan is to continue Lasix at present dose, followup BMET for repeat visit in one to 2 weeks. No other change to current cardiac  regimen.  Nonischemic cardiomyopathy LVEF approximately 45% by recent assessment. Patient is status post ICD extraction in the setting of endocarditis.  CKD (chronic kidney disease) stage 3, GFR 30-59 ml/min Creatinine has improved from 2.2 down to 1.8. Pending follow up with nephrology noted.  Bacterial endocarditis Tricuspid valve vegetation, ICD extraction, status post IV antibiotics. Suspect PICC line can be removed.    Satira Sark, M.D., F.A.C.C.

## 2013-12-26 NOTE — Assessment & Plan Note (Signed)
LVEF approximately 45% by recent assessment. Patient is status post ICD extraction in the setting of endocarditis.

## 2013-12-26 NOTE — Assessment & Plan Note (Signed)
Creatinine has improved from 2.2 down to 1.8. Pending follow up with nephrology noted.

## 2013-12-29 ENCOUNTER — Ambulatory Visit (INDEPENDENT_AMBULATORY_CARE_PROVIDER_SITE_OTHER): Payer: Medicare Other | Admitting: Internal Medicine

## 2013-12-29 ENCOUNTER — Encounter: Payer: Self-pay | Admitting: Internal Medicine

## 2013-12-29 VITALS — BP 145/48 | HR 82 | Ht 61.0 in | Wt 179.0 lb

## 2013-12-29 DIAGNOSIS — I5022 Chronic systolic (congestive) heart failure: Secondary | ICD-10-CM

## 2013-12-29 DIAGNOSIS — I70219 Atherosclerosis of native arteries of extremities with intermittent claudication, unspecified extremity: Secondary | ICD-10-CM

## 2013-12-29 DIAGNOSIS — I330 Acute and subacute infective endocarditis: Secondary | ICD-10-CM

## 2013-12-29 NOTE — Progress Notes (Signed)
HPI [name] returns today for followup. She is a very pleasant 68 year old woman with a nonischemic cardiomyopathy, nonobstructive coronary disease, chronic systolic heart failure, status post biventricular ICD implantation. In the interim, she has developed an ICD system infection with staph and undergone extraction complicated by septic pulmonary emboli, hypotension and ventilatory dependent respiratory failure. She has recovered. She remains on oxygen and is about to transfer out of her SNF. She has residual peripheral edema. Allergies  Allergen Reactions  . Hydrocodone-Acetaminophen Other (See Comments)    hallucinations     Current Outpatient Prescriptions  Medication Sig Dispense Refill  . ACETAMINOPHEN PO Take by mouth as needed.      Marland Kitchen amLODipine (NORVASC) 10 MG tablet Take 1 tablet (10 mg total) by mouth 2 (two) times daily.  60 tablet  6  . aspirin 81 MG tablet Take 81 mg by mouth daily.       . budesonide (PULMICORT) 0.25 MG/2ML nebulizer solution Take 2 mLs (0.25 mg total) by nebulization 2 (two) times daily.  60 mL  12  . Calcium Carbonate-Vitamin D (CALCIUM-VITAMIN D) 500-200 MG-UNIT per tablet Take 1 tablet by mouth 2 (two) times daily.      . carvedilol (COREG) 25 MG tablet Take 1 tablet (25 mg total) by mouth 2 (two) times daily with a meal.  60 tablet  6  . Cholecalciferol (VITAMIN D-3) 1000 UNITS CAPS Take 1,000 Units by mouth daily.      Marland Kitchen docusate sodium (COLACE) 100 MG capsule Take 100 mg by mouth daily as needed. For constipation      . feeding supplement, GLUCERNA SHAKE, (GLUCERNA SHAKE) LIQD Take 237 mLs by mouth daily as needed (Please offer if pt eats less than 75% of meals).    0  . furosemide (LASIX) 80 MG tablet Take 1 tablet (80 mg total) by mouth 2 (two) times daily.  90 tablet  3  . hydrALAZINE (APRESOLINE) 100 MG tablet Take 1 tablet (100 mg total) by mouth 4 (four) times daily.  90 tablet  5  . insulin glargine (LANTUS) 100 UNIT/ML injection Inject 0.35  mLs (35 Units total) into the skin daily.  10 mL  11  . insulin lispro (HUMALOG) 100 UNIT/ML injection Inject 0-8 Units into the skin every evening. Sliding scale: 200-249=2 units, 250-299=4 units, 300-349=6 units, >350=8 units      . isosorbide mononitrate (IMDUR) 60 MG 24 hr tablet Take 1 tablet (60 mg total) by mouth daily.      . Multiple Vitamins-Minerals (MULTIVITAMIN WITH MINERALS) tablet Take 1 tablet by mouth daily.      . nitroGLYCERIN (NITROSTAT) 0.4 MG SL tablet Place 0.4 mg under the tongue every 5 (five) minutes as needed. chest pain      . pantoprazole (PROTONIX) 40 MG tablet Take 1 tablet (40 mg total) by mouth 2 (two) times daily.      Marland Kitchen saccharomyces boulardii (FLORASTOR) 250 MG capsule Take 250 mg by mouth 2 (two) times daily.      . sodium chloride 0.9 % SOLN 250 mL with vancomycin 10 G SOLR 1,500 mg Inject 1,500 mg into the vein every other day. Vanc through goal 15-20; follow every 5 days and adjust medication if needed. Plan is to treat for a total of 6 weeks (last day December 26, 2013)    0  . traMADol (ULTRAM) 50 MG tablet Take 1 tablet (50 mg total) by mouth every 4 (four) hours as needed for moderate pain.  [place]  tablet  0  . vitamin C (ASCORBIC ACID) 500 MG tablet Take 500 mg by mouth daily.      Marland Kitchen zinc gluconate 50 MG tablet Take 50 mg by mouth daily.       No current facility-administered medications for this visit.     Past Medical History  Diagnosis Date  . Diabetes mellitus, type 2   . Essential hypertension, benign   . Chronic systolic heart failure   . Coronary atherosclerosis of native coronary artery     Nonobstructive  . Nonischemic cardiomyopathy     LVEF 20% up to 45%  . PAD (peripheral artery disease)     Left foot gangrene  . Anxiety   . Depression   . MRSA bacteremia January 2015    Associated with probable tricuspid valve vegetation, ICD system extracted February 2050    ROS:   All systems reviewed and negative except as noted in the  HPI.   Past Surgical History  Procedure Laterality Date  . Abdominal hysterectomy    . Left shoulder surgery      Rotator Cuff  . Breast lumpectomy      Bil  . Cardiac defibrillator placement      Medtronic [id number]   . Exploratory laparotomy with lysis of adhesions    . Bladder surgery  2011 BENIGN MASS REMOVED FROM BLADDER  . Eye surgery  BILATERAL CATARACTS REMOVAL  10 YEARS AGO PER PATIENT  . Pr vein bypass graft,aorto-fem-pop  05-2011    Left popliteal- PTA graft   . Viterectomy  11/10/2012    OD   Dr Zigmund Daniel  . 25 gauge pars plana vitrectomy with 20 gauge mvr port for macular hole Right 11/10/2012    Procedure: 46 GAUGE PARS PLANA VITRECTOMY WITH 20 GAUGE MVR PORT FOR MACULAR HOLE;  Surgeon: Hayden Pedro, MD;  Location: [place];  Service: Ophthalmology;  Laterality: Right;  . Gas insertion Right 11/10/2012    Procedure: INSERTION OF GAS;  Surgeon: Hayden Pedro, MD;  Location: [place];  Service: Ophthalmology;  Laterality: Right;  C3F8  . Serum patch Right 11/10/2012    Procedure: SERUM PATCH;  Surgeon: Hayden Pedro, MD;  Location: [place];  Service: Ophthalmology;  Laterality: Right;  . 25 gauge pars plana vitrectomy with 20 gauge mvr port for macular hole Right 05/09/2013    Procedure: 64 GAUGE PARS PLANA VITRECTOMY WITH 20 GAUGE MVR PORT FOR MACULAR HOLE;  Surgeon: Hayden Pedro, MD;  Location: [place];  Service: Ophthalmology;  Laterality: Right;  . Gas/fluid exchange Right 05/09/2013    Procedure: GAS/FLUID EXCHANGE;  Surgeon: Hayden Pedro, MD;  Location: [place];  Service: Ophthalmology;  Laterality: Right;  C3F8   . Serum patch Right 05/09/2013    Procedure: SERUM PATCH;  Surgeon: Hayden Pedro, MD;  Location: [place];  Service: Ophthalmology;  Laterality: Right;  . Laser photo ablation Right 05/09/2013    Procedure: LASER PHOTO ABLATION;  Surgeon: Hayden Pedro, MD;  Location: [place];  Service: Ophthalmology;  Laterality: Right;  Endolaser   . Membrane peel Right 05/09/2013     Procedure: MEMBRANE PEEL;  Surgeon: Hayden Pedro, MD;  Location: [place];  Service: Ophthalmology;  Laterality: Right;  . Icd lead removal N/A 11/09/2013    Procedure: ICD LEAD REMOVAL;  Surgeon: Evans Lance, MD;  Location: [place];  Service: Cardiovascular;  Laterality: N/A;  . Central venous catheter insertion Right 11/09/2013    Procedure: INSERTION  CENTRAL LINE ADULT;  Surgeon: Evans Lance, MD;  Location: [facility] OR;  Service: Cardiovascular;  Laterality: Right;     Family History  Problem Relation Age of Onset  . Cirrhosis Father   . Cancer Mother     Ovarian  . Ovarian cancer Mother      History   Social History  . Marital Status: Divorced    Spouse Name: N/A    Number of Children: 2  . Years of Education: N/A   Occupational History  . DISABLED   . [employer] [facility]-full time     (out of work)   Social History Main Topics  . Smoking status: Never Smoker   . Smokeless tobacco: Never Used     Comment: tobacco use-no  . Alcohol Use: No  . Drug Use: No  . Sexual Activity: Not on file   Other Topics Concern  . Not on file   Social History Narrative  . No narrative on file     BP 145/48  Pulse 82  Ht 5\' 1"  (1.549 m)  Wt 179 lb (81.194 kg)  BMI 33.84 kg/m2  Physical Exam:  Stable but chronically ill appearing 68 year old woman, NAD HEENT: Unremarkable Neck:  7 cm JVD, no thyromegally Lungs:  Clearwith no wheezes, rales, or rhonchi. Well healed skin incision. HEART:  Regular rate rhythm, no murmurs, no rubs, no clicks Abd:  soft, positive bowel sounds, no organomegally, no rebound, no guarding Ext:  2 plus pulses, 3+ peripheral edema, no cyanosis, no clubbing Skin:  No rashes no nodules Neuro:  CN II through XII intact, motor grossly intact   Assess/Plan:

## 2013-12-29 NOTE — Assessment & Plan Note (Signed)
She is off of all anti-biotics and appears to be recovering with no residual evidence of infection. She will undergo watchful waiting. The patient notes that she had a skin infection on her foot several months prior to her endocarditis and I suspect this was the source.

## 2013-12-29 NOTE — Patient Instructions (Signed)
Your physician recommends that you schedule a follow-up appointment in: As needed  

## 2013-12-29 NOTE — Assessment & Plan Note (Addendum)
Her symptoms are class 2B and appear to be well compensated. I have asked her to continue her current meds, maintain a low sodium diet and followup with Dr. [name] who is her primary cardiologist. At this point, additional ICD implant would be contra-indicated because of the high risk of another infection.

## 2014-01-10 ENCOUNTER — Encounter: Payer: Self-pay | Admitting: Cardiology

## 2014-01-10 ENCOUNTER — Ambulatory Visit (INDEPENDENT_AMBULATORY_CARE_PROVIDER_SITE_OTHER): Payer: Medicare Other | Admitting: Cardiology

## 2014-01-10 VITALS — BP 141/57 | HR 66 | Ht 61.0 in | Wt 180.0 lb

## 2014-01-10 DIAGNOSIS — I428 Other cardiomyopathies: Secondary | ICD-10-CM

## 2014-01-10 DIAGNOSIS — N183 Chronic kidney disease, stage 3 unspecified: Secondary | ICD-10-CM

## 2014-01-10 DIAGNOSIS — I10 Essential (primary) hypertension: Secondary | ICD-10-CM

## 2014-01-10 DIAGNOSIS — I5022 Chronic systolic (congestive) heart failure: Secondary | ICD-10-CM

## 2014-01-10 DIAGNOSIS — I70219 Atherosclerosis of native arteries of extremities with intermittent claudication, unspecified extremity: Secondary | ICD-10-CM

## 2014-01-10 MED ORDER — FUROSEMIDE 80 MG PO TABS: ORAL_TABLET | ORAL | Status: DC

## 2014-01-10 MED ORDER — AMLODIPINE BESYLATE 10 MG PO TABS: 10.0000 mg | ORAL_TABLET | Freq: Every day | ORAL | Status: DC

## 2014-01-10 NOTE — Assessment & Plan Note (Signed)
Increased creatinine noted. Will try to pick an intermediate dose of Lasix since she is still not at baseline in terms of her volume status. Will go with Lasix 80 mg in the morning and 40 mg in the evening. Followup planned over the next week with a repeat BMET. Agree with changing hydralazine for now. Will also reduce Norvasc to once daily dosing.

## 2014-01-10 NOTE — Assessment & Plan Note (Signed)
Creatinine 1.9-2.4.

## 2014-01-10 NOTE — Progress Notes (Signed)
Clinical Summary [name] is a medically complex 68 y.o.female seen recently in March for followup of volume overload with chronic systolic heart failure. She had shown significant improvement at that time. Interval followup with Dr. Lovena Le noted just recently. He is here today with her son and daughter-in-law. Still at the [facility], but tells me that she will likely be discharged sometime next week. She states that she is progressing well in rehabilitation  Weight is relatively stable compared to her recent visit. Recent lab work shows BUN 90, creatinine 2.4, potassium 3.9. Dr. Sherrie Sport recently changed her hydralazine to 3 times a day and reduced Lasix to 40 mg twice daily. Foley catheter is now out. She has been able to wean down oxygen, but states that ambulatory saturation still get down to the mid 80s sometimes.   Allergies  Allergen Reactions  . Hydrocodone-Acetaminophen Other (See Comments)    hallucinations    Current Outpatient Prescriptions  Medication Sig Dispense Refill  . ACETAMINOPHEN PO Take by mouth as needed.      Marland Kitchen amLODipine (NORVASC) 10 MG tablet Take 1 tablet (10 mg total) by mouth daily.  30 tablet  6  . aspirin 81 MG tablet Take 81 mg by mouth daily.       . budesonide (PULMICORT) 0.25 MG/2ML nebulizer solution Take 2 mLs (0.25 mg total) by nebulization 2 (two) times daily.  60 mL  12  . Calcium Carbonate-Vitamin D (CALCIUM-VITAMIN D) 500-200 MG-UNIT per tablet Take 1 tablet by mouth 2 (two) times daily.      . carvedilol (COREG) 25 MG tablet Take 1 tablet (25 mg total) by mouth 2 (two) times daily with a meal.  60 tablet  6  . Cholecalciferol (VITAMIN D-3) 1000 UNITS CAPS Take 1,000 Units by mouth daily.      Marland Kitchen docusate sodium (COLACE) 100 MG capsule Take 100 mg by mouth daily as needed. For constipation      . feeding supplement, GLUCERNA SHAKE, (GLUCERNA SHAKE) LIQD Take 237 mLs by mouth daily as needed (Please offer if pt eats less than 75%  of meals).    0  . furosemide (LASIX) 80 MG tablet Take 1 tablet (80 mg) in the morning and 1/2 tablet (40 mg) in the evening.  60 tablet  6  . hydrALAZINE (APRESOLINE) 100 MG tablet Take 100 mg by mouth 3 (three) times daily.      . insulin glargine (LANTUS) 100 UNIT/ML injection Inject 0.35 mLs (35 Units total) into the skin daily.  10 mL  11  . insulin lispro (HUMALOG) 100 UNIT/ML injection Inject 0-8 Units into the skin every evening. Sliding scale: 200-249=2 units, 250-299=4 units, 300-349=6 units, >350=8 units      . isosorbide mononitrate (IMDUR) 60 MG 24 hr tablet Take 1 tablet (60 mg total) by mouth daily.      . Multiple Vitamins-Minerals (MULTIVITAMIN WITH MINERALS) tablet Take 1 tablet by mouth daily.      . nitroGLYCERIN (NITROSTAT) 0.4 MG SL tablet Place 0.4 mg under the tongue every 5 (five) minutes as needed. chest pain      . pantoprazole (PROTONIX) 40 MG tablet Take 1 tablet (40 mg total) by mouth 2 (two) times daily.      Marland Kitchen saccharomyces boulardii (FLORASTOR) 250 MG capsule Take 250 mg by mouth 2 (two) times daily.      . sodium chloride 0.9 % SOLN 250 mL with vancomycin 10 G SOLR 1,500 mg Inject 1,500 mg  into the vein every other day. Vanc through goal 15-20; follow every 5 days and adjust medication if needed. Plan is to treat for a total of 6 weeks (last day December 26, 2013)    0  . traMADol (ULTRAM) 50 MG tablet Take 1 tablet (50 mg total) by mouth every 4 (four) hours as needed for moderate pain.  30 tablet  0  . vitamin C (ASCORBIC ACID) 500 MG tablet Take 500 mg by mouth daily.      Marland Kitchen zinc gluconate 50 MG tablet Take 50 mg by mouth daily.       No current facility-administered medications for this visit.    Past Medical History  Diagnosis Date  . Diabetes mellitus, type 2   . Essential hypertension, benign   . Chronic systolic heart failure   . Coronary atherosclerosis of native coronary artery     Nonobstructive  . Nonischemic cardiomyopathy     LVEF 20% up to 45%    . PAD (peripheral artery disease)     Left foot gangrene  . Anxiety   . Depression   . MRSA bacteremia January 2015    Associated with probable tricuspid valve vegetation, ICD system extracted February 2050    Social History [name] reports that she has never smoked. She has never used smokeless tobacco. [name] reports that she does not drink alcohol.  Review of Systems No chest pain, palpitations, syncope. Reports stable appetite. Otherwise negative.  Physical Examination Filed Vitals:   01/10/14 0839  BP: 141/57  Pulse: 66   Filed Weights   01/10/14 0839  Weight: 180 lb (81.647 kg)    [name], sitting in wheelchair. Wearing oxygen via nasal cannula. HEENT: Conjunctiva and lids normal, oropharynx clear.  Neck: No carotid bruis, no thyromegaly.  Lungs: Clear with diminished breath sounds, decreased at bases, nonlabored.  Cardiac: Indistinct PMI, regular rate and rhythm, 2/6 systolic murmur at the base, paradoxically split S2, no S3.  Abdomen: Protuberant.  Extremities: Improving edema, diminished distal pulses..  Skin: Dry with flaking.  Musculoskeletal: No kyphosis.  Neuropsychiatric: Alert and oriented x3, affect appropriate.   Problem List and Plan   Chronic systolic heart failure Increased creatinine noted. Will try to pick an intermediate dose of Lasix since she is still not at baseline in terms of her volume status. Will go with Lasix 80 mg in the morning and 40 mg in the evening. Followup planned over the next week with a repeat BMET. Agree with changing hydralazine for now. Will also reduce Norvasc to once daily dosing.  Essential hypertension, benign Continue to follow with changes noted above.  CKD (chronic kidney disease) stage 3, GFR 30-59 ml/min Creatinine 1.9-2.4.  Nonischemic cardiomyopathy LVEF approximately 45% by recent assessment. Patient is status post ICD extraction in the setting of  endocarditis.    Satira Sark, M.D., F.A.C.C.

## 2014-01-10 NOTE — Patient Instructions (Signed)
Your physician recommends that you schedule a follow-up appointment in: 1-2 weeks with Dr [name]  Your physician has recommended you make the following change in your medication:   Decreased Norvasc to 10 mg Daily Take Lasix 80 mg in the am and 40 mg in the pm.

## 2014-01-10 NOTE — Assessment & Plan Note (Signed)
Continue to follow with changes noted above.

## 2014-01-10 NOTE — Assessment & Plan Note (Signed)
LVEF approximately 45% by recent assessment. Patient is status post ICD extraction in the setting of endocarditis.

## 2014-01-11 ENCOUNTER — Encounter: Payer: Self-pay | Admitting: Cardiology

## 2014-01-16 ENCOUNTER — Encounter: Payer: Self-pay | Admitting: Internal Medicine

## 2014-01-16 ENCOUNTER — Ambulatory Visit (INDEPENDENT_AMBULATORY_CARE_PROVIDER_SITE_OTHER): Payer: Medicare Other | Admitting: Internal Medicine

## 2014-01-16 VITALS — BP 115/62 | Temp 97.7°F | Ht 61.0 in | Wt 168.5 lb

## 2014-01-16 DIAGNOSIS — I70219 Atherosclerosis of native arteries of extremities with intermittent claudication, unspecified extremity: Secondary | ICD-10-CM

## 2014-01-16 DIAGNOSIS — R7881 Bacteremia: Secondary | ICD-10-CM

## 2014-01-16 DIAGNOSIS — A4901 Methicillin susceptible Staphylococcus aureus infection, unspecified site: Secondary | ICD-10-CM

## 2014-01-16 NOTE — Progress Notes (Signed)
Patient ID: [name], female   DOB: 03-Nov-1945, 68 y.o.   MRN: [id number]         [facility] for Infectious Disease  Patient Active Problem List   Diagnosis Date Noted  . Bacterial endocarditis 11/14/2013    Priority: High  . MRSA bacteremia 11/08/2013    Priority: High  . CKD (chronic kidney disease) stage 3, GFR 30-59 ml/min 12/18/2013  . Type 2 diabetes mellitus 11/08/2013  . Normocytic anemia 11/08/2013  . Proliferative diabetic retinopathy 10/12/2012  . Atherosclerosis of native arteries of the extremities with intermittent claudication 03/22/2012  . HYPERLIPIDEMIA 12/31/2009  . CORONARY ATHEROSCLEROSIS NATIVE CORONARY ARTERY 12/31/2009  . Nonischemic cardiomyopathy 12/31/2009  . Chronic systolic heart failure 0000000  . Essential hypertension, benign 12/05/2009  . LEFT BUNDLE BRANCH BLOCK 12/05/2009    Patient's Medications  New Prescriptions   No medications on file  Previous Medications   ACETAMINOPHEN PO    Take by mouth as needed.   AMLODIPINE (NORVASC) 10 MG TABLET    Take 1 tablet (10 mg total) by mouth daily.   ASPIRIN 81 MG TABLET    Take 81 mg by mouth daily.    BUDESONIDE (PULMICORT) 0.25 MG/2ML NEBULIZER SOLUTION    Take 2 mLs (0.25 mg total) by nebulization 2 (two) times daily.   CALCIUM CARBONATE-VITAMIN D (CALCIUM-VITAMIN D) 500-200 MG-UNIT PER TABLET    Take 1 tablet by mouth 2 (two) times daily.   CARVEDILOL (COREG) 25 MG TABLET    Take 1 tablet (25 mg total) by mouth 2 (two) times daily with a meal.   CHOLECALCIFEROL (VITAMIN D-3) 1000 UNITS CAPS    Take 1,000 Units by mouth daily.   DOCUSATE SODIUM (COLACE) 100 MG CAPSULE    Take 100 mg by mouth daily as needed. For constipation   FEEDING SUPPLEMENT, GLUCERNA SHAKE, (GLUCERNA SHAKE) LIQD    Take 237 mLs by mouth daily as needed (Please offer if pt eats less than 75% of meals).   FUROSEMIDE (LASIX) 80 MG TABLET    Take 1 tablet (80 mg) in the morning and 1/2 tablet (40 mg) in the evening.    HYDRALAZINE (APRESOLINE) 100 MG TABLET    Take 100 mg by mouth 3 (three) times daily.   INSULIN GLARGINE (LANTUS) 100 UNIT/ML INJECTION    Inject 0.35 mLs (35 Units total) into the skin daily.   INSULIN LISPRO (HUMALOG) 100 UNIT/ML INJECTION    Inject 0-8 Units into the skin every evening. Sliding scale: 200-249=2 units, 250-299=4 units, 300-349=6 units, >350=8 units   ISOSORBIDE MONONITRATE (IMDUR) 60 MG 24 HR TABLET    Take 1 tablet (60 mg total) by mouth daily.   MULTIPLE VITAMINS-MINERALS (MULTIVITAMIN WITH MINERALS) TABLET    Take 1 tablet by mouth daily.   NITROGLYCERIN (NITROSTAT) 0.4 MG SL TABLET    Place 0.4 mg under the tongue every 5 (five) minutes as needed. chest pain   PANTOPRAZOLE (PROTONIX) 40 MG TABLET    Take 1 tablet (40 mg total) by mouth 2 (two) times daily.   SACCHAROMYCES BOULARDII (FLORASTOR) 250 MG CAPSULE    Take 250 mg by mouth 2 (two) times daily.   TRAMADOL (ULTRAM) 50 MG TABLET    Take 1 tablet (50 mg total) by mouth every 4 (four) hours as needed for moderate pain.   VITAMIN C (ASCORBIC ACID) 500 MG TABLET    Take 500 mg by mouth daily.   ZINC GLUCONATE 50 MG TABLET    Take  50 mg by mouth daily.  Modified Medications   No medications on file  Discontinued Medications   SODIUM CHLORIDE 0.9 % SOLN 250 ML WITH VANCOMYCIN 10 G SOLR 1,500 MG    Inject 1,500 mg into the vein every other day. Vanc through goal 15-20; follow every 5 days and adjust medication if needed. Plan is to treat for a total of 6 weeks (last day December 26, 2013)    Subjective: [name] is in for [name] routine followup visit with [name] son, [name]. [name] completed 6 weeks of IV vancomycin on March 14 for [name] MRSA bacteremia, tricuspid valve endocarditis and vegetations on [name] ICD leads. [name] ICD and leads were removed. [name] PICC line has been removed. [name] is feeling better. [name] has had no fever, chills or sweats. [name] appetite is good.  Review of Systems: Pertinent items are noted in HPI.  Past  Medical History  Diagnosis Date  . Diabetes mellitus, type 2   . Essential hypertension, benign   . Chronic systolic heart failure   . Coronary atherosclerosis of native coronary artery     Nonobstructive  . Nonischemic cardiomyopathy     LVEF 20% up to 45%  . PAD (peripheral artery disease)     Left foot gangrene  . Anxiety   . Depression   . MRSA bacteremia January 2015    Associated with probable tricuspid valve vegetation, ICD system extracted February 2050    History  Substance Use Topics  . Smoking status: Never Smoker   . Smokeless tobacco: Never Used     Comment: tobacco use-no  . Alcohol Use: No    Family History  Problem Relation Age of Onset  . Cirrhosis Father   . Cancer Mother     Ovarian  . Ovarian cancer Mother     Allergies  Allergen Reactions  . Hydrocodone-Acetaminophen Other (See Comments)    hallucinations    Objective: Temp: 97.7 F (36.5 C) (04/14 0854) Temp src: Oral (04/14 0854) BP: 115/62 mmHg (04/14 0854)  General: [name] is seated in [name] wheelchair. [name] is in no distress. Skin: No rash Lungs: Clear Cor: Regular S1 and S2 with 2/6 systolic murmur. [name] left anterior chest incision is healing nicely   Assessment: I suspect that [name] MRSA bacteremia and endocarditis has been cured.  Plan: 1. Continue observation off of antibiotics 2. Followup here as needed   Michel Bickers, MD [facility] for [place] [phone] pager   [phone] cell 01/16/2014, 9:28 AM

## 2014-01-18 ENCOUNTER — Encounter: Payer: Self-pay | Admitting: Cardiology

## 2014-01-18 ENCOUNTER — Ambulatory Visit (INDEPENDENT_AMBULATORY_CARE_PROVIDER_SITE_OTHER): Payer: Medicare Other | Admitting: Cardiology

## 2014-01-18 VITALS — BP 138/45 | HR 65 | Ht 61.0 in | Wt 170.0 lb

## 2014-01-18 DIAGNOSIS — N183 Chronic kidney disease, stage 3 unspecified: Secondary | ICD-10-CM

## 2014-01-18 DIAGNOSIS — I10 Essential (primary) hypertension: Secondary | ICD-10-CM

## 2014-01-18 DIAGNOSIS — I70219 Atherosclerosis of native arteries of extremities with intermittent claudication, unspecified extremity: Secondary | ICD-10-CM

## 2014-01-18 DIAGNOSIS — I428 Other cardiomyopathies: Secondary | ICD-10-CM

## 2014-01-18 DIAGNOSIS — I5022 Chronic systolic (congestive) heart failure: Secondary | ICD-10-CM

## 2014-01-18 LAB — BASIC METABOLIC PANEL
BUN: 71 mg/dL — ABNORMAL HIGH (ref 6–23)
CHLORIDE: 110 meq/L (ref 96–112)
CO2: 23 meq/L (ref 19–32)
Calcium: 9 mg/dL (ref 8.4–10.5)
Creat: 2.22 mg/dL — ABNORMAL HIGH (ref 0.50–1.10)
Glucose, Bld: 97 mg/dL (ref 70–99)
POTASSIUM: 4.4 meq/L (ref 3.5–5.3)
Sodium: 143 mEq/L (ref 135–145)

## 2014-01-18 NOTE — Assessment & Plan Note (Signed)
Last creatinine was 2.4.

## 2014-01-18 NOTE — Assessment & Plan Note (Signed)
Continue current regimen for now including present diuretic dose. Needs followup BMET. She has home health nursing in place, also followup with Dr. Everette Rank later this month. I will see her back in one month, sooner if needed.

## 2014-01-18 NOTE — Assessment & Plan Note (Signed)
LVEF approximately 45% by recent assessment. Patient is status post ICD extraction in the setting of endocarditis.

## 2014-01-18 NOTE — Progress Notes (Signed)
Clinical Summary [name] is a 68 y.o.female recently seen on April 8 for followup of cardiomyopathy and congestive heart failure. Medications were adjusted at the last visit, still evidence of volume overload at that time. She just recently went home from the [facility]. States that she has home health and home oxygen in place. She will be seeing [name] later this month.  Lab work from April 7 showed BUN 90, creatinine 2.4, sodium 141, potassium 3.9. Weight is relatively stable since last visit.  I reviewed her medications. We discussed reasonable times of day to take her doses, also stressed importance of continuing to follow her weight, fluid restriction.   Allergies  Allergen Reactions  . Hydrocodone-Acetaminophen Other (See Comments)    hallucinations    Current Outpatient Prescriptions  Medication Sig Dispense Refill  . ACETAMINOPHEN PO Take by mouth as needed.      Marland Kitchen amLODipine (NORVASC) 10 MG tablet Take 1 tablet (10 mg total) by mouth daily.  30 tablet  6  . aspirin 81 MG tablet Take 81 mg by mouth daily.       . budesonide (PULMICORT) 0.25 MG/2ML nebulizer solution Take 2 mLs (0.25 mg total) by nebulization 2 (two) times daily.  60 mL  12  . Calcium Carbonate-Vitamin D (CALCIUM-VITAMIN D) 500-200 MG-UNIT per tablet Take 1 tablet by mouth 2 (two) times daily.      . carvedilol (COREG) 25 MG tablet Take 1 tablet (25 mg total) by mouth 2 (two) times daily with a meal.  60 tablet  6  . Cholecalciferol (VITAMIN D-3) 1000 UNITS CAPS Take 1,000 Units by mouth daily.      Marland Kitchen docusate sodium (COLACE) 100 MG capsule Take 100 mg by mouth daily as needed. For constipation      . feeding supplement, GLUCERNA SHAKE, (GLUCERNA SHAKE) LIQD Take 237 mLs by mouth daily as needed (Please offer if pt eats less than 75% of meals).    0  . furosemide (LASIX) 80 MG tablet Take 1 tablet (80 mg) in the morning and 1/2 tablet (40 mg) in the evening.  60 tablet  6  . hydrALAZINE  (APRESOLINE) 100 MG tablet Take 100 mg by mouth 3 (three) times daily.      . insulin glargine (LANTUS) 100 UNIT/ML injection Inject 0.35 mLs (35 Units total) into the skin daily.  10 mL  11  . insulin lispro (HUMALOG) 100 UNIT/ML injection Inject 0-8 Units into the skin every evening. Sliding scale: 200-249=2 units, 250-299=4 units, 300-349=6 units, >350=8 units      . isosorbide mononitrate (IMDUR) 60 MG 24 hr tablet Take 1 tablet (60 mg total) by mouth daily.      . Multiple Vitamins-Minerals (MULTIVITAMIN WITH MINERALS) tablet Take 1 tablet by mouth daily.      . nitroGLYCERIN (NITROSTAT) 0.4 MG SL tablet Place 0.4 mg under the tongue every 5 (five) minutes as needed. chest pain      . pantoprazole (PROTONIX) 40 MG tablet Take 1 tablet (40 mg total) by mouth 2 (two) times daily.      Marland Kitchen saccharomyces boulardii (FLORASTOR) 250 MG capsule Take 250 mg by mouth 2 (two) times daily.      . traMADol (ULTRAM) 50 MG tablet Take 1 tablet (50 mg total) by mouth every 4 (four) hours as needed for moderate pain.  30 tablet  0  . vitamin C (ASCORBIC ACID) 500 MG tablet Take 500 mg by mouth daily.      Marland Kitchen  zinc gluconate 50 MG tablet Take 50 mg by mouth daily.       No current facility-administered medications for this visit.    Past Medical History  Diagnosis Date  . Diabetes mellitus, type 2   . Essential hypertension, benign   . Chronic systolic heart failure   . Coronary atherosclerosis of native coronary artery     Nonobstructive  . Nonischemic cardiomyopathy     LVEF 20% up to 45%  . PAD (peripheral artery disease)     Left foot gangrene  . Anxiety   . Depression   . MRSA bacteremia January 2015    Associated with probable tricuspid valve vegetation, ICD system extracted February 2050    Social History [name] reports that she has never smoked. She has never used smokeless tobacco. [name] reports that she does not drink alcohol.  Review of Systems Head no chest pain or  palpitations. Stable appetite. Otherwise negative  Physical Examination Filed Vitals:   01/18/14 0950  BP: 138/45  Pulse: 65   Filed Weights   01/18/14 0950  Weight: 170 lb (77.111 kg)    [name].  HEENT: Conjunctiva and lids normal, oropharynx clear.  Neck: No carotid bruis, no thyromegaly.  Lungs: Clear with diminished breath sounds, decreased at bases, nonlabored.  Cardiac: Indistinct PMI, regular rate and rhythm, 2/6 systolic murmur at the base, paradoxically split S2, no S3.  Abdomen: Protuberant.  Extremities: Improving edema, diminished distal pulses..  Skin: Warm and dry.  Musculoskeletal: No kyphosis.  Neuropsychiatric: Alert and oriented x3, affect appropriate.   Problem List and Plan   Chronic systolic heart failure Continue current regimen for now including present diuretic dose. Needs followup BMET. She has home health nursing in place, also followup with [name] later this month. I will see her back in one month, sooner if needed.  CKD (chronic kidney disease) stage 3, GFR 30-59 ml/min Last creatinine was 2.4.  Nonischemic cardiomyopathy LVEF approximately 45% by recent assessment. Patient is status post ICD extraction in the setting of endocarditis.    [name], M.D., F.A.C.C.

## 2014-01-18 NOTE — Patient Instructions (Addendum)
Your physician recommends that you schedule a follow-up appointment in: 1 month    Your physician recommends that you continue on your current medications as directed. Please refer to the Current Medication list given to you today.    Please get blood work drawn Artist)    Thank you for choosing [facility] !

## 2014-01-19 ENCOUNTER — Encounter: Payer: Self-pay | Admitting: *Deleted

## 2014-02-13 ENCOUNTER — Ambulatory Visit (INDEPENDENT_AMBULATORY_CARE_PROVIDER_SITE_OTHER): Payer: Medicare Other | Admitting: Ophthalmology

## 2014-02-13 DIAGNOSIS — E1165 Type 2 diabetes mellitus with hyperglycemia: Secondary | ICD-10-CM

## 2014-02-13 DIAGNOSIS — H3581 Retinal edema: Secondary | ICD-10-CM

## 2014-02-13 DIAGNOSIS — H35039 Hypertensive retinopathy, unspecified eye: Secondary | ICD-10-CM

## 2014-02-13 DIAGNOSIS — E11359 Type 2 diabetes mellitus with proliferative diabetic retinopathy without macular edema: Secondary | ICD-10-CM

## 2014-02-13 DIAGNOSIS — E1139 Type 2 diabetes mellitus with other diabetic ophthalmic complication: Secondary | ICD-10-CM

## 2014-02-13 DIAGNOSIS — H35349 Macular cyst, hole, or pseudohole, unspecified eye: Secondary | ICD-10-CM

## 2014-02-13 DIAGNOSIS — I10 Essential (primary) hypertension: Secondary | ICD-10-CM

## 2014-02-13 DIAGNOSIS — H43819 Vitreous degeneration, unspecified eye: Secondary | ICD-10-CM

## 2014-02-21 ENCOUNTER — Ambulatory Visit: Payer: Medicare Other | Admitting: Cardiology

## 2014-02-27 ENCOUNTER — Encounter: Payer: Self-pay | Admitting: Cardiology

## 2014-02-27 ENCOUNTER — Ambulatory Visit (INDEPENDENT_AMBULATORY_CARE_PROVIDER_SITE_OTHER): Payer: Medicare Other | Admitting: Cardiology

## 2014-02-27 VITALS — BP 128/52 | HR 60 | Ht 61.0 in | Wt 152.0 lb

## 2014-02-27 DIAGNOSIS — I10 Essential (primary) hypertension: Secondary | ICD-10-CM

## 2014-02-27 DIAGNOSIS — I428 Other cardiomyopathies: Secondary | ICD-10-CM

## 2014-02-27 DIAGNOSIS — N183 Chronic kidney disease, stage 3 unspecified: Secondary | ICD-10-CM

## 2014-02-27 DIAGNOSIS — I5022 Chronic systolic (congestive) heart failure: Secondary | ICD-10-CM

## 2014-02-27 DIAGNOSIS — I70219 Atherosclerosis of native arteries of extremities with intermittent claudication, unspecified extremity: Secondary | ICD-10-CM

## 2014-02-27 NOTE — Assessment & Plan Note (Signed)
LVEF approximately 45% by most recent assessment. Patient is status post ICD extraction in the setting of endocarditis.

## 2014-02-27 NOTE — Assessment & Plan Note (Signed)
Creatinine down to 2.0. Patient followed by Dr. Lowanda Foster.

## 2014-02-27 NOTE — Assessment & Plan Note (Signed)
Blood pressure control looks good today. 

## 2014-02-27 NOTE — Patient Instructions (Addendum)
Your physician recommends that you schedule a follow-up appointment in: 3 months -Please get blood work just before next visit.I have enclosed lab slip for (BMET)    Your physician recommends that you continue on your current medications as directed. Please refer to the Current Medication list given to you today.     Thank you for choosing [facility] !

## 2014-02-27 NOTE — Addendum Note (Signed)
Addended by: Barbarann Ehlers A on: 02/27/2014 09:25 AM   Modules accepted: Orders

## 2014-02-27 NOTE — Assessment & Plan Note (Signed)
Much improved in terms of volume status. Continue current regimen including present diuretic dosing. Renal function is improving. Followup in 3 months with BMET.

## 2014-02-27 NOTE — Progress Notes (Signed)
Clinical Summary Ms. Disantis is a 68 y.o.female last seen in April of this year. She looks much better in terms of her overall volume status, having lost a significant amount of weight over the last 6 months with diuresis. She states she feels essentially back to baseline. Reports following her weights at home, and compliance with her medications.  Followup lab work in mid April showed potassium 4.4, BUN 71, creatinine 2.2. She sees Dr. Lowanda Foster and had recent lab work in May showing potassium 3.8, BUN 59, and creatinine 2.0.   Allergies  Allergen Reactions  . Hydrocodone-Acetaminophen Other (See Comments)    hallucinations    Current Outpatient Prescriptions  Medication Sig Dispense Refill  . amLODipine (NORVASC) 10 MG tablet Take 1 tablet (10 mg total) by mouth daily.  30 tablet  6  . aspirin 81 MG tablet Take 81 mg by mouth daily.       . carvedilol (COREG) 25 MG tablet Take 1 tablet (25 mg total) by mouth 2 (two) times daily with a meal.  60 tablet  6  . Cholecalciferol (VITAMIN D-3) 1000 UNITS CAPS Take 1,000 Units by mouth daily.      Marland Kitchen docusate sodium (COLACE) 100 MG capsule Take 100 mg by mouth daily as needed. For constipation      . feeding supplement, GLUCERNA SHAKE, (GLUCERNA SHAKE) LIQD Take 237 mLs by mouth daily as needed (Please offer if pt eats less than 75% of meals).    0  . furosemide (LASIX) 80 MG tablet Take 1 tablet (80 mg) in the morning and 1/2 tablet (40 mg) in the evening.  60 tablet  6  . hydrALAZINE (APRESOLINE) 100 MG tablet Take 100 mg by mouth 3 (three) times daily.      . insulin glargine (LANTUS) 100 UNIT/ML injection Inject 0.35 mLs (35 Units total) into the skin daily.  10 mL  11  . insulin lispro (HUMALOG) 100 UNIT/ML injection Inject 0-8 Units into the skin every evening. Sliding scale: 200-249=2 units, 250-299=4 units, 300-349=6 units, >350=8 units      . Multiple Vitamins-Minerals (MULTIVITAMIN WITH MINERALS) tablet Take 1 tablet by mouth daily.       . nitroGLYCERIN (NITROSTAT) 0.4 MG SL tablet Place 0.4 mg under the tongue every 5 (five) minutes as needed. chest pain      . saccharomyces boulardii (FLORASTOR) 250 MG capsule Take 250 mg by mouth 2 (two) times daily.      . vitamin C (ASCORBIC ACID) 500 MG tablet Take 500 mg by mouth daily.      Marland Kitchen zinc gluconate 50 MG tablet Take 50 mg by mouth daily.       No current facility-administered medications for this visit.    Past Medical History  Diagnosis Date  . Diabetes mellitus, type 2   . Essential hypertension, benign   . Chronic systolic heart failure   . Coronary atherosclerosis of native coronary artery     Nonobstructive  . Nonischemic cardiomyopathy     LVEF 20% up to 45%  . PAD (peripheral artery disease)     Left foot gangrene  . Anxiety   . Depression   . MRSA bacteremia January 2015    Associated with probable tricuspid valve vegetation, ICD system extracted February 2050    Social History Ms. Elley reports that she has never smoked. She has never used smokeless tobacco. Ms. Volta reports that she does not drink alcohol.  Review of Systems No palpitations, dizziness,  syncope. No exertional chest pain. Otherwise as outlined.  Physical Examination Filed Vitals:   02/27/14 0850  BP: 128/52  Pulse: 60   Filed Weights   02/27/14 0850  Weight: 152 lb (68.947 kg)    [name].  HEENT: Conjunctiva and lids normal, oropharynx clear.  Neck: No carotid bruis, no thyromegaly.  Lungs: Clear with diminished breath sounds, decreased at bases, nonlabored.  Cardiac: Indistinct PMI, regular rate and rhythm, 2/6 systolic murmur at the base, paradoxically split S2, no S3.  Abdomen: Protuberant.  Extremities: Trace edema, diminished distal pulses..    Problem List and Plan   Chronic systolic heart failure Much improved in terms of volume status. Continue current regimen including present diuretic dosing. Renal function is improving.  Followup in 3 months with BMET.  Nonischemic cardiomyopathy LVEF approximately 45% by most recent assessment. Patient is status post ICD extraction in the setting of endocarditis.  Essential hypertension, benign Blood pressure control looks good today.  CKD (chronic kidney disease) stage 3, GFR 30-59 ml/min Creatinine down to 2.0. Patient followed by Dr. Lowanda Foster.    Satira Sark, M.D., F.A.C.C.

## 2014-03-07 IMAGING — CR DG ABD PORTABLE 1V
1 series · 1 of 1 positions shown · non-contrast
Comparison: 02/20/2011

CLINICAL DATA: Orogastric tube placement.

EXAM:
PORTABLE ABDOMEN - 1 VIEW

[AP]
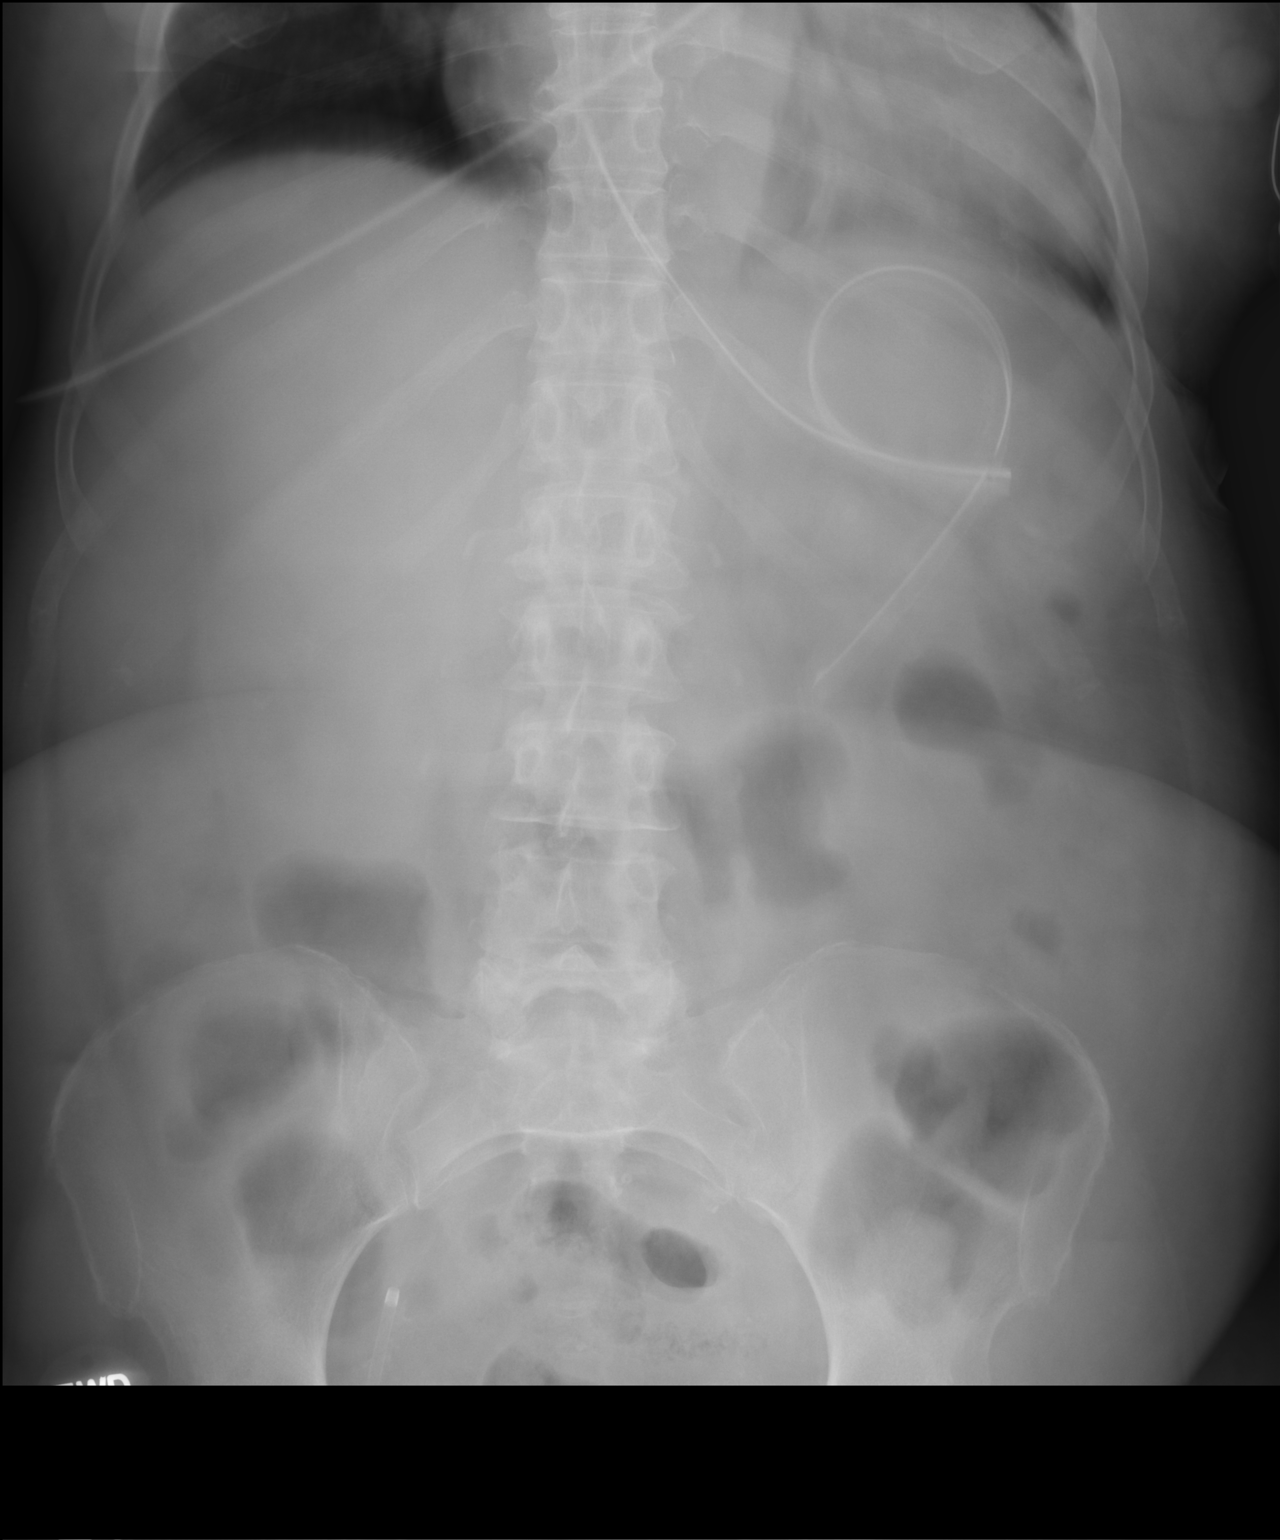

[1 of 1 positions shown; findings below may reference images not displayed]

FINDINGS: Nasogastric tube curls within the stomach, well positioned.

There is a femoral a placed right iliac vein introducer sheath.

Soft tissues are otherwise unremarkable.  Normal bowel gas pattern.
IMPRESSION: Nasogastric tube is well positioned curling within the stomach.

## 2014-03-08 ENCOUNTER — Other Ambulatory Visit: Payer: Self-pay | Admitting: Cardiology

## 2014-03-08 ENCOUNTER — Other Ambulatory Visit: Payer: Self-pay

## 2014-03-08 IMAGING — CR DG CHEST 1V PORT
1 series · 1 of 1 positions shown · non-contrast
Comparison: Portable exam 3338 hr compared to 11/09/2013

CLINICAL DATA: Respiratory distress, intubated

EXAM:
PORTABLE CHEST - 1 VIEW

[AP]
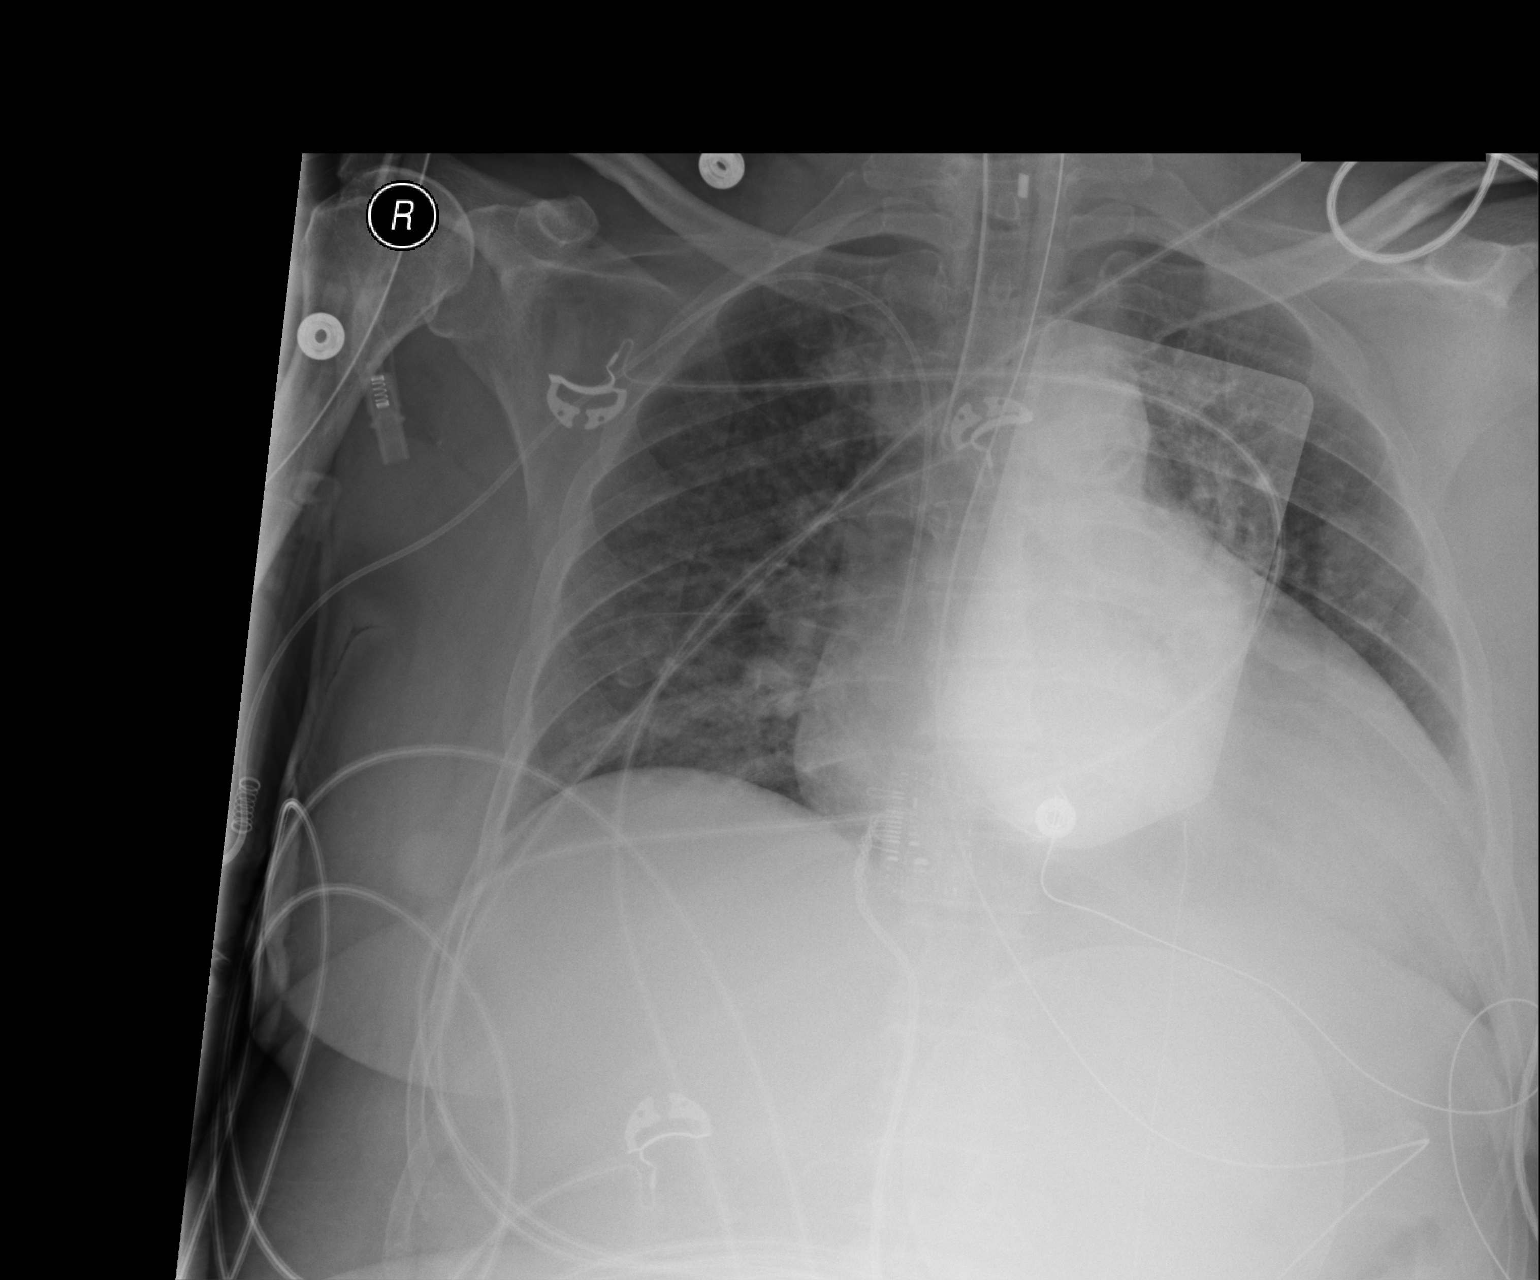

[1 of 1 positions shown; findings below may reference images not displayed]

FINDINGS: Tip of endotracheal tube projects 2.1 cm above carinal.

Nasogastric tube extends into stomach.

Right arm PICC line tip projects over SVC above cavoatrial junction.

External pacing leads and EKG leads are superimposed with the chest.

Enlargement of cardiac silhouette with pulmonary vascular
congestion.

Mild perihilar infiltrates bilaterally.

No gross pleural effusion or pneumothorax.
IMPRESSION: Enlargement of cardiac silhouette with pulmonary vascular congestion
mild perihilar infiltrates question edema.

## 2014-03-09 ENCOUNTER — Telehealth: Payer: Self-pay | Admitting: Cardiology

## 2014-03-09 MED ORDER — FUROSEMIDE 80 MG PO TABS: ORAL_TABLET | ORAL | Status: DC

## 2014-03-09 NOTE — Telephone Encounter (Signed)
Received fax refill request  Rx # [id number] Medication:  Furosemide 80 mg tab Qty 60 Sig:  Take one tablet by mouth once daily in the morning and one-half once daily in the evening Physician:  [name]

## 2014-03-09 NOTE — Telephone Encounter (Signed)
Medication sent to pharmacy  

## 2014-03-20 ENCOUNTER — Ambulatory Visit (INDEPENDENT_AMBULATORY_CARE_PROVIDER_SITE_OTHER)
Admission: RE | Admit: 2014-03-20 | Discharge: 2014-03-20 | Disposition: A | Discharge status: Home / Self Care | Payer: Medicare Other | Source: Ambulatory Visit | Attending: Vascular Surgery | Admitting: Vascular Surgery

## 2014-03-20 ENCOUNTER — Encounter: Payer: Self-pay | Admitting: Vascular Surgery

## 2014-03-20 ENCOUNTER — Ambulatory Visit (HOSPITAL_COMMUNITY)
Admission: RE | Admit: 2014-03-20 | Discharge: 2014-03-20 | Disposition: A | Discharge status: Home / Self Care | Payer: Medicare Other | Source: Ambulatory Visit | Attending: Vascular Surgery | Admitting: Vascular Surgery

## 2014-03-20 ENCOUNTER — Ambulatory Visit (INDEPENDENT_AMBULATORY_CARE_PROVIDER_SITE_OTHER): Payer: Medicare Other | Admitting: Vascular Surgery

## 2014-03-20 VITALS — BP 171/49 | HR 64 | Temp 98.6°F | Ht 61.0 in | Wt 157.7 lb

## 2014-03-20 DIAGNOSIS — I739 Peripheral vascular disease, unspecified: Secondary | ICD-10-CM

## 2014-03-20 DIAGNOSIS — Z48812 Encounter for surgical aftercare following surgery on the circulatory system: Secondary | ICD-10-CM

## 2014-03-20 DIAGNOSIS — I70269 Atherosclerosis of native arteries of extremities with gangrene, unspecified extremity: Secondary | ICD-10-CM | POA: Insufficient documentation

## 2014-03-20 NOTE — Progress Notes (Signed)
[facility] [place] HISTORY AND PHYSICAL   CC:  Non healing ulcer left great toe  McInnis, Angus G, MD  HPI: This is a 68 y.o. female who presents today with complaints of a non healing wound on her left great toe that has been going on for about 3 weeks.  She states that she will have an occasional pain in her foot.  She does not have rest pain or pain that wakes her at night.    She states that earlier this year, she developed a blood infection and was hospitalized for about a month and went to a nursing home for rehabilitation.  She is now home.  She states that she did have some peeling of her skin after taking an antibiotic, but she does not know what this is.  She is on a beta blocker for her hypertension.  She is on an aspirin daily.    Past Medical History  Diagnosis Date  . Diabetes mellitus, type 2   . Essential hypertension, benign   . Chronic systolic heart failure   . Coronary atherosclerosis of native coronary artery     Nonobstructive  . Nonischemic cardiomyopathy     LVEF 20% up to 45%  . PAD (peripheral artery disease)     Left foot gangrene  . Anxiety   . Depression   . MRSA bacteremia January 2015    Associated with probable tricuspid valve vegetation, ICD system extracted February 2050  . Atrial fibrillation    Past Surgical History  Procedure Laterality Date  . Abdominal hysterectomy    . Left shoulder surgery      Rotator Cuff  . Breast lumpectomy      Bil  . Cardiac defibrillator placement      Medtronic [id number]   . Exploratory laparotomy with lysis of adhesions    . Bladder surgery  2011 BENIGN MASS REMOVED FROM BLADDER  . Eye surgery  BILATERAL CATARACTS REMOVAL  10 YEARS AGO PER PATIENT  . Pr vein bypass graft,aorto-fem-pop  05-2011    Left popliteal- PTA graft   . Viterectomy  11/10/2012    OD   Dr Zigmund Daniel  . 25 gauge pars plana vitrectomy with 20 gauge mvr port for macular hole Right 11/10/2012    Procedure: 55 GAUGE PARS  PLANA VITRECTOMY WITH 20 GAUGE MVR PORT FOR MACULAR HOLE;  Surgeon: Hayden Pedro, MD;  Location: [facility];  Service: Ophthalmology;  Laterality: Right;  . Gas insertion Right 11/10/2012    Procedure: INSERTION OF GAS;  Surgeon: Hayden Pedro, MD;  Location: [facility];  Service: Ophthalmology;  Laterality: Right;  C3F8  . Serum patch Right 11/10/2012    Procedure: SERUM PATCH;  Surgeon: Hayden Pedro, MD;  Location: [facility];  Service: Ophthalmology;  Laterality: Right;  . 25 gauge pars plana vitrectomy with 20 gauge mvr port for macular hole Right 05/09/2013    Procedure: 33 GAUGE PARS PLANA VITRECTOMY WITH 20 GAUGE MVR PORT FOR MACULAR HOLE;  Surgeon: Hayden Pedro, MD;  Location: [facility];  Service: Ophthalmology;  Laterality: Right;  . Gas/fluid exchange Right 05/09/2013    Procedure: GAS/FLUID EXCHANGE;  Surgeon: Hayden Pedro, MD;  Location: [facility];  Service: Ophthalmology;  Laterality: Right;  C3F8   . Serum patch Right 05/09/2013    Procedure: SERUM PATCH;  Surgeon: Hayden Pedro, MD;  Location: [facility];  Service: Ophthalmology;  Laterality: Right;  . Laser photo ablation Right 05/09/2013  Procedure: LASER PHOTO ABLATION;  Surgeon: Hayden Pedro, MD;  Location: [facility];  Service: Ophthalmology;  Laterality: Right;  Endolaser   . Membrane peel Right 05/09/2013    Procedure: MEMBRANE PEEL;  Surgeon: Hayden Pedro, MD;  Location: [facility];  Service: Ophthalmology;  Laterality: Right;  . Icd lead removal N/A 11/09/2013    Procedure: ICD LEAD REMOVAL;  Surgeon: Evans Lance, MD;  Location: [facility];  Service: Cardiovascular;  Laterality: N/A;  . Central venous catheter insertion Right 11/09/2013    Procedure: INSERTION CENTRAL LINE ADULT;  Surgeon: Evans Lance, MD;  Location: [facility];  Service: Cardiovascular;  Laterality: Right;    Allergies  Allergen Reactions  . Hydrocodone-Acetaminophen Other (See Comments)    hallucinations    Current Outpatient Prescriptions  Medication Sig Dispense Refill  .  amLODipine (NORVASC) 10 MG tablet Take 1 tablet (10 mg total) by mouth daily.  30 tablet  6  . aspirin 81 MG tablet Take 81 mg by mouth daily.       . carvedilol (COREG) 25 MG tablet Take 1 tablet (25 mg total) by mouth 2 (two) times daily with a meal.  60 tablet  6  . Cholecalciferol (VITAMIN D-3) 1000 UNITS CAPS Take 1,000 Units by mouth daily.      Marland Kitchen docusate sodium (COLACE) 100 MG capsule Take 100 mg by mouth daily as needed. For constipation      . furosemide (LASIX) 80 MG tablet Take 1 tablet (80 mg) in the morning and 1/2 tablet (40 mg) in the evening.  60 tablet  3  . insulin glargine (LANTUS) 100 UNIT/ML injection Inject 0.35 mLs (35 Units total) into the skin daily.  10 mL  11  . insulin lispro (HUMALOG) 100 UNIT/ML injection Inject 0-8 Units into the skin every evening. Sliding scale: 200-249=2 units, 250-299=4 units, 300-349=6 units, >350=8 units      . Multiple Vitamins-Minerals (MULTIVITAMIN WITH MINERALS) tablet Take 1 tablet by mouth daily.      . nitroGLYCERIN (NITROSTAT) 0.4 MG SL tablet Place 0.4 mg under the tongue every 5 (five) minutes as needed. chest pain      . SODIUM BICARBONATE PO Take 1 tablet by mouth 3 (three) times daily.      . vitamin C (ASCORBIC ACID) 500 MG tablet Take 500 mg by mouth daily.      . feeding supplement, GLUCERNA SHAKE, (GLUCERNA SHAKE) LIQD Take 237 mLs by mouth daily as needed (Please offer if pt eats less than 75% of meals).    0  . hydrALAZINE (APRESOLINE) 100 MG tablet Take 100 mg by mouth 3 (three) times daily.      Marland Kitchen saccharomyces boulardii (FLORASTOR) 250 MG capsule Take 250 mg by mouth 2 (two) times daily.      Marland Kitchen zinc gluconate 50 MG tablet Take 50 mg by mouth daily.       No current facility-administered medications for this visit.    Family History  Problem Relation Age of Onset  . Cirrhosis Father   . Cancer Mother     Ovarian  . Ovarian cancer Mother   . Hypertension Mother     History   Social History  . Marital Status:  Divorced    Spouse Name: N/A    Number of Children: 2  . Years of Education: N/A   Occupational History  . DISABLED   . [employer] [facility]-full time     (out of work)   Social  History Main Topics  . Smoking status: Never Smoker   . Smokeless tobacco: Never Used     Comment: tobacco use-no  . Alcohol Use: No  . Drug Use: No  . Sexual Activity: Not on file   Other Topics Concern  . Not on file   Social History Narrative  . No narrative on file     ROS: [x]  Positive   [ ]  Negative   [ ]  All sytems reviewed and are negative  Cardiovascular: []  chest pain/pressure []  palpitations []  SOB lying flat []  DOE []  pain in legs while walking []  pain in feet when lying flat []  hx of DVT []  hx of phlebitis []  swelling in legs []  varicose veins  Pulmonary: []  productive cough []  asthma []  wheezing  Neurologic: []  weakness in []  arms []  legs []  numbness in []  arms []  legs [] difficulty speaking or slurred speech []  temporary loss of vision in one eye []  dizziness  Hematologic: []  bleeding problems []  problems with blood clotting easily  GI []  vomiting blood []  blood in stool  GU: []  burning with urination []  blood in urine  Psychiatric: []  hx of major depression  Integumentary: []  rashes []  ulcers  Constitutional: []  fever []  chills   PHYSICAL EXAMINATION:  Filed Vitals:   03/20/14 1537  BP: 171/49  Pulse: 64  Temp: 98.6 F (37 C)   Body mass index is 29.81 kg/(m^2).  General:  WDWN in NAD Gait: Not observed HENT: WNL, normocephalic Eyes: Pupils equal Pulmonary: normal non-labored breathing , without Rales, rhonchi,  wheezing Cardiac: RRR Abdomen: soft, NT, no masses Skin: without rashes, with ulcer of left great toe with dry gangrene Vascular Exam/Pulses: + monophasic left DP/PT; she does have a 2+ left popliteal pulse and 1+ right popliteal pulse. Extremities: with ischemic changes, with dry Gangrene , without cellulitis; with open  wounds;  Musculoskeletal: no muscle wasting or atrophy  Neurologic: A&O X 3; Appropriate Affect ; SENSATION: normal; MOTOR FUNCTION:  moving all extremities equally. Speech is fluent/normal   Non-Invasive Vascular Imaging:  03/20/14 1.  Resting right ABI cannot be calculated due to non compressible vessels. 2.  The left ABI is .45   Previous ABI's 03/21/13: Right:  0.58 Left:  0.50  Pt meds includes: Statin:  no Beta Blocker:  yes Aspirin:  yes ACEI:  no ARB:  no Other Antiplatelet/Anticoagulant:  no   ASSESSMENT: 68 y.o. female with known distal occlusion of her left popliteal to posterior tibial artery bypass graft who now presents with dry gangrene of her left great toe.  PLAN: -given the fact that she has had an occlusion of her bypass in 2013, it is unlikely there will be a benefit from an arteriogram, which the risks would outweigh the benefit due to her renal insufficiency.  She does have monophasic doppler signals in the left DP/PT and a 2+ left popliteal pulse.   -Dr. Donnetta Hutching favors proceeding with toe amputation at this time with a reasonable chance of healing the wound.  If she does not heal the toe amputation, she may need to undergo the arteriogram, which would put her at risk for worsening renal failure.   Leontine Locket, PA-C [facility] [phone]  Clinic MD:  Pt seen and examined in conjunction with Dr. Donnetta Hutching  I have examined the patient, reviewed and agree with above. Very pleasant very complicated  68 year old female with dry gangrene of her left great toe. She is known to me from prior left  below-knee popliteal to posterior tibial bypass in August of 2012. At that time she had extensive necrosis on her foot. She was treated by Dr. [name] with eventual healing of all of this. She was seen in our office in followup and was found to have occlusion which was asymptomatic of her left below-knee popliteal to posterior tibial bypass in late 2013. She was  critically ill earlier this year and was admitted with sepsis presumed to be related to endocarditis. She had removal of her AICD. She reports her the best treatment weeks noticing necrosis over the tip of her third toe he was seen by Dr. Nils Pyle at the wound center and was referred here today. She does have renal insufficiency during her recent hospitalization as well  I did review her old records. She had extensive tibial disease at the time of her surgery in 2012 and had a very small posterior tibial artery for outflow.  On listening to her foot with the Doppler and in reviewing her Doppler study she does have good Doppler flow at the level of her dorsalis pedis and posterior tibial was a macular minutes a 0.45  Explained options of a repeat arteriogram to see if redo a distal tibial bypass with possible. I feel this is highly unlikely and I do feel that she is a for a significant risk of renal insufficiency with contrast study. I feel is very low likelihood that this could give her any benefit. I feel that she does have a reasonable chance of healing the toe amputation primarily. She has not seen Dr. [name] back since she's had wound healing. We will get her into see him this week for evaluation toe amputation. If she has nonhealing we did discuss attempted redo distal tibial bypass but feel this is very low chance for long-term success. Feel that her best option will be primary toe amputation  EARLY, TODD, MD 03/20/2014 4:54 PM

## 2014-03-22 ENCOUNTER — Encounter (HOSPITAL_COMMUNITY): Payer: Self-pay | Admitting: *Deleted

## 2014-03-22 ENCOUNTER — Other Ambulatory Visit (HOSPITAL_COMMUNITY): Payer: Self-pay | Admitting: Orthopedic Surgery

## 2014-03-23 ENCOUNTER — Ambulatory Visit (HOSPITAL_COMMUNITY)
Admission: RE | Admit: 2014-03-23 | Discharge: 2014-03-24 | Disposition: A | Discharge status: Home / Self Care | Payer: Medicare Other | Source: Ambulatory Visit | Attending: Orthopedic Surgery | Admitting: Orthopedic Surgery

## 2014-03-23 ENCOUNTER — Ambulatory Visit (HOSPITAL_COMMUNITY): Payer: Medicare Other | Admitting: Anesthesiology

## 2014-03-23 ENCOUNTER — Encounter (HOSPITAL_COMMUNITY): Payer: Self-pay

## 2014-03-23 ENCOUNTER — Encounter (HOSPITAL_COMMUNITY)
Admission: RE | Disposition: A | Discharge status: Home / Self Care | Payer: Self-pay | Source: Ambulatory Visit | Attending: Orthopedic Surgery

## 2014-03-23 ENCOUNTER — Ambulatory Visit (HOSPITAL_COMMUNITY): Payer: Medicare Other

## 2014-03-23 ENCOUNTER — Encounter (HOSPITAL_COMMUNITY): Payer: Medicare Other | Admitting: Anesthesiology

## 2014-03-23 DIAGNOSIS — I4891 Unspecified atrial fibrillation: Secondary | ICD-10-CM | POA: Insufficient documentation

## 2014-03-23 DIAGNOSIS — M869 Osteomyelitis, unspecified: Secondary | ICD-10-CM | POA: Insufficient documentation

## 2014-03-23 DIAGNOSIS — E1149 Type 2 diabetes mellitus with other diabetic neurological complication: Secondary | ICD-10-CM | POA: Insufficient documentation

## 2014-03-23 DIAGNOSIS — I739 Peripheral vascular disease, unspecified: Secondary | ICD-10-CM | POA: Insufficient documentation

## 2014-03-23 DIAGNOSIS — Z794 Long term (current) use of insulin: Secondary | ICD-10-CM | POA: Insufficient documentation

## 2014-03-23 DIAGNOSIS — L97509 Non-pressure chronic ulcer of other part of unspecified foot with unspecified severity: Secondary | ICD-10-CM

## 2014-03-23 DIAGNOSIS — E1169 Type 2 diabetes mellitus with other specified complication: Secondary | ICD-10-CM | POA: Insufficient documentation

## 2014-03-23 DIAGNOSIS — I2510 Atherosclerotic heart disease of native coronary artery without angina pectoris: Secondary | ICD-10-CM | POA: Insufficient documentation

## 2014-03-23 DIAGNOSIS — I70269 Atherosclerosis of native arteries of extremities with gangrene, unspecified extremity: Secondary | ICD-10-CM

## 2014-03-23 DIAGNOSIS — J4489 Other specified chronic obstructive pulmonary disease: Secondary | ICD-10-CM | POA: Insufficient documentation

## 2014-03-23 DIAGNOSIS — E1152 Type 2 diabetes mellitus with diabetic peripheral angiopathy with gangrene: Secondary | ICD-10-CM | POA: Diagnosis present

## 2014-03-23 DIAGNOSIS — I509 Heart failure, unspecified: Secondary | ICD-10-CM | POA: Insufficient documentation

## 2014-03-23 DIAGNOSIS — I428 Other cardiomyopathies: Secondary | ICD-10-CM | POA: Insufficient documentation

## 2014-03-23 DIAGNOSIS — I5022 Chronic systolic (congestive) heart failure: Secondary | ICD-10-CM | POA: Insufficient documentation

## 2014-03-23 DIAGNOSIS — F3289 Other specified depressive episodes: Secondary | ICD-10-CM | POA: Insufficient documentation

## 2014-03-23 DIAGNOSIS — I96 Gangrene, not elsewhere classified: Secondary | ICD-10-CM | POA: Insufficient documentation

## 2014-03-23 DIAGNOSIS — F411 Generalized anxiety disorder: Secondary | ICD-10-CM | POA: Insufficient documentation

## 2014-03-23 DIAGNOSIS — E1142 Type 2 diabetes mellitus with diabetic polyneuropathy: Secondary | ICD-10-CM | POA: Insufficient documentation

## 2014-03-23 DIAGNOSIS — M9080 Osteopathy in diseases classified elsewhere, unspecified site: Secondary | ICD-10-CM | POA: Insufficient documentation

## 2014-03-23 DIAGNOSIS — J449 Chronic obstructive pulmonary disease, unspecified: Secondary | ICD-10-CM | POA: Insufficient documentation

## 2014-03-23 DIAGNOSIS — F329 Major depressive disorder, single episode, unspecified: Secondary | ICD-10-CM | POA: Insufficient documentation

## 2014-03-23 DIAGNOSIS — Z8614 Personal history of Methicillin resistant Staphylococcus aureus infection: Secondary | ICD-10-CM | POA: Insufficient documentation

## 2014-03-23 DIAGNOSIS — I10 Essential (primary) hypertension: Secondary | ICD-10-CM | POA: Insufficient documentation

## 2014-03-23 DIAGNOSIS — E1159 Type 2 diabetes mellitus with other circulatory complications: Secondary | ICD-10-CM | POA: Insufficient documentation

## 2014-03-23 DIAGNOSIS — E11621 Type 2 diabetes mellitus with foot ulcer: Secondary | ICD-10-CM | POA: Diagnosis present

## 2014-03-23 HISTORY — PX: AMPUTATION: SHX166

## 2014-03-23 LAB — COMPREHENSIVE METABOLIC PANEL
ALT: 10 U/L (ref 0–35)
AST: 23 U/L (ref 0–37)
Albumin: 3 g/dL — ABNORMAL LOW (ref 3.5–5.2)
Alkaline Phosphatase: 82 U/L (ref 39–117)
BUN: 64 mg/dL — ABNORMAL HIGH (ref 6–23)
CALCIUM: 9.2 mg/dL (ref 8.4–10.5)
CO2: 22 mEq/L (ref 19–32)
CREATININE: 2.13 mg/dL — AB (ref 0.50–1.10)
Chloride: 99 mEq/L (ref 96–112)
GFR calc Af Amer: 26 mL/min — ABNORMAL LOW (ref >90)
GFR calc non Af Amer: 23 mL/min — ABNORMAL LOW (ref >90)
Glucose, Bld: 96 mg/dL (ref 70–99)
Potassium: 4.3 mEq/L (ref 3.7–5.3)
Sodium: 139 mEq/L (ref 137–147)
Total Bilirubin: 0.2 mg/dL — ABNORMAL LOW (ref 0.3–1.2)
Total Protein: 9.5 g/dL — ABNORMAL HIGH (ref 6.0–8.3)

## 2014-03-23 LAB — CBC
HCT: 27.8 % — ABNORMAL LOW (ref 36.0–46.0)
Hemoglobin: 9.3 g/dL — ABNORMAL LOW (ref 12.0–15.0)
MCH: 29.6 pg (ref 26.0–34.0)
MCHC: 33.5 g/dL (ref 30.0–36.0)
MCV: 88.5 fL (ref 78.0–100.0)
Platelets: 248 10*3/uL (ref 150–400)
RBC: 3.14 MIL/uL — ABNORMAL LOW (ref 3.87–5.11)
RDW: 14.1 % (ref 11.5–15.5)
WBC: 10 10*3/uL (ref 4.0–10.5)

## 2014-03-23 LAB — GLUCOSE, CAPILLARY
GLUCOSE-CAPILLARY: 85 mg/dL (ref 70–99)
GLUCOSE-CAPILLARY: 139 mg/dL — AB (ref 70–99)

## 2014-03-23 LAB — SURGICAL PCR SCREEN
MRSA, PCR: NEGATIVE
Staphylococcus aureus: NEGATIVE

## 2014-03-23 LAB — PROTIME-INR
INR: 1.14 (ref 0.00–1.49)
Prothrombin Time: 14.4 seconds (ref 11.6–15.2)

## 2014-03-23 LAB — APTT: aPTT: 34 seconds (ref 24–37)

## 2014-03-23 SURGERY — AMPUTATION, FOOT, RAY
Anesthesia: Monitor Anesthesia Care | Site: Foot | Laterality: Left

## 2014-03-23 MED ORDER — LACTATED RINGERS IV SOLN
INTRAVENOUS | Status: DC
  Administered 2014-03-23: 14:00:00 via INTRAVENOUS

## 2014-03-23 MED ORDER — FENTANYL CITRATE 0.05 MG/ML IJ SOLN
INTRAMUSCULAR | Status: AC
  Filled 2014-03-23: qty 5

## 2014-03-23 MED ORDER — METHOCARBAMOL 500 MG PO TABS
500.0000 mg | ORAL_TABLET | Freq: Four times a day (QID) | ORAL | Status: DC | PRN
  Administered 2014-03-24 (×2): 500 mg via ORAL
  Filled 2014-03-23 (×2): qty 1

## 2014-03-23 MED ORDER — DEXTROSE 5 % IV SOLN
INTRAVENOUS | Status: DC | PRN
  Administered 2014-03-23: 15:00:00 via INTRAVENOUS

## 2014-03-23 MED ORDER — SODIUM BICARBONATE 650 MG PO TABS
325.0000 mg | ORAL_TABLET | Freq: Three times a day (TID) | ORAL | Status: DC
  Administered 2014-03-23 – 2014-03-24 (×3): 325 mg via ORAL
  Filled 2014-03-23 (×5): qty 0.5

## 2014-03-23 MED ORDER — CEFAZOLIN SODIUM-DEXTROSE 2-3 GM-% IV SOLR
2.0000 g | INTRAVENOUS | Status: AC
  Administered 2014-03-23: 2 g via INTRAVENOUS
  Filled 2014-03-23: qty 50

## 2014-03-23 MED ORDER — ROPIVACAINE HCL 5 MG/ML IJ SOLN
INTRAMUSCULAR | Status: DC | PRN
  Administered 2014-03-23: 30 mL via PERINEURAL

## 2014-03-23 MED ORDER — LACTATED RINGERS IV SOLN
INTRAVENOUS | Status: DC | PRN
  Administered 2014-03-23: 14:00:00 via INTRAVENOUS

## 2014-03-23 MED ORDER — HYDROMORPHONE HCL PF 1 MG/ML IJ SOLN: 0.5000 mg | INTRAMUSCULAR | Status: DC | PRN

## 2014-03-23 MED ORDER — METOCLOPRAMIDE HCL 10 MG PO TABS
5.0000 mg | ORAL_TABLET | Freq: Three times a day (TID) | ORAL | Status: DC | PRN
Start: 2014-03-23 — End: 2014-03-24

## 2014-03-23 MED ORDER — CARVEDILOL 25 MG PO TABS
25.0000 mg | ORAL_TABLET | Freq: Two times a day (BID) | ORAL | Status: DC
  Administered 2014-03-23 – 2014-03-24 (×2): 25 mg via ORAL
  Filled 2014-03-23 (×4): qty 1

## 2014-03-23 MED ORDER — ACETAMINOPHEN 325 MG PO TABS: 325.0000 mg | ORAL_TABLET | ORAL | Status: DC | PRN

## 2014-03-23 MED ORDER — ONDANSETRON HCL 4 MG PO TABS: 4.0000 mg | ORAL_TABLET | Freq: Four times a day (QID) | ORAL | Status: DC | PRN

## 2014-03-23 MED ORDER — AMLODIPINE BESYLATE 10 MG PO TABS
10.0000 mg | ORAL_TABLET | Freq: Every day | ORAL | Status: DC
  Administered 2014-03-24: 10 mg via ORAL
  Filled 2014-03-23: qty 1

## 2014-03-23 MED ORDER — MUPIROCIN 2 % EX OINT
TOPICAL_OINTMENT | Freq: Once | CUTANEOUS | Status: AC
  Administered 2014-03-23: 14:00:00 via NASAL

## 2014-03-23 MED ORDER — ASPIRIN EC 325 MG PO TBEC
325.0000 mg | DELAYED_RELEASE_TABLET | Freq: Every day | ORAL | Status: DC
  Administered 2014-03-23 – 2014-03-24 (×2): 325 mg via ORAL
  Filled 2014-03-23 (×2): qty 1

## 2014-03-23 MED ORDER — ACETAMINOPHEN 160 MG/5ML PO SOLN: 325.0000 mg | ORAL | Status: DC | PRN

## 2014-03-23 MED ORDER — METHOCARBAMOL 1000 MG/10ML IJ SOLN
500.0000 mg | Freq: Four times a day (QID) | INTRAVENOUS | Status: DC | PRN
  Filled 2014-03-23: qty 5

## 2014-03-23 MED ORDER — INSULIN ASPART 100 UNIT/ML ~~LOC~~ SOLN: 4.0000 [IU] | Freq: Three times a day (TID) | SUBCUTANEOUS | Status: DC

## 2014-03-23 MED ORDER — OXYCODONE-ACETAMINOPHEN 5-325 MG PO TABS: 1.0000 | ORAL_TABLET | ORAL | Status: DC | PRN

## 2014-03-23 MED ORDER — HYDRALAZINE HCL 50 MG PO TABS
100.0000 mg | ORAL_TABLET | Freq: Three times a day (TID) | ORAL | Status: DC
  Administered 2014-03-23 – 2014-03-24 (×3): 100 mg via ORAL
  Filled 2014-03-23 (×5): qty 2

## 2014-03-23 MED ORDER — INSULIN GLARGINE 100 UNIT/ML ~~LOC~~ SOLN
35.0000 [IU] | Freq: Every day | SUBCUTANEOUS | Status: DC
  Administered 2014-03-23 – 2014-03-24 (×2): 35 [IU] via SUBCUTANEOUS
  Filled 2014-03-23 (×2): qty 0.35

## 2014-03-23 MED ORDER — SODIUM CHLORIDE 0.9 % IV SOLN
INTRAVENOUS | Status: DC
  Administered 2014-03-24: 01:00:00 via INTRAVENOUS

## 2014-03-23 MED ORDER — INSULIN ASPART 100 UNIT/ML ~~LOC~~ SOLN: 0.0000 [IU] | Freq: Three times a day (TID) | SUBCUTANEOUS | Status: DC

## 2014-03-23 MED ORDER — MUPIROCIN 2 % EX OINT: TOPICAL_OINTMENT | Freq: Two times a day (BID) | CUTANEOUS | Status: DC

## 2014-03-23 MED ORDER — DOCUSATE SODIUM 100 MG PO CAPS: 100.0000 mg | ORAL_CAPSULE | Freq: Every day | ORAL | Status: DC | PRN

## 2014-03-23 MED ORDER — OXYCODONE HCL 5 MG/5ML PO SOLN: 5.0000 mg | Freq: Once | ORAL | Status: DC | PRN

## 2014-03-23 MED ORDER — FENTANYL CITRATE 0.05 MG/ML IJ SOLN
INTRAMUSCULAR | Status: DC | PRN
  Administered 2014-03-23 (×2): 25 ug via INTRAVENOUS

## 2014-03-23 MED ORDER — METOCLOPRAMIDE HCL 5 MG/ML IJ SOLN
5.0000 mg | Freq: Three times a day (TID) | INTRAMUSCULAR | Status: DC | PRN
Start: 2014-03-23 — End: 2014-03-24

## 2014-03-23 MED ORDER — ONDANSETRON HCL 4 MG/2ML IJ SOLN: 4.0000 mg | Freq: Once | INTRAMUSCULAR | Status: DC | PRN

## 2014-03-23 MED ORDER — MIDAZOLAM HCL 5 MG/5ML IJ SOLN
INTRAMUSCULAR | Status: DC | PRN
Start: 2014-03-23 — End: 2014-03-23
  Administered 2014-03-23 (×2): 1 mg via INTRAVENOUS

## 2014-03-23 MED ORDER — ASPIRIN 81 MG PO TABS: 81.0000 mg | ORAL_TABLET | Freq: Every day | ORAL | Status: DC

## 2014-03-23 MED ORDER — DOCUSATE SODIUM 100 MG PO CAPS
100.0000 mg | ORAL_CAPSULE | Freq: Two times a day (BID) | ORAL | Status: DC
  Administered 2014-03-23 – 2014-03-24 (×2): 100 mg via ORAL
  Filled 2014-03-23 (×3): qty 1

## 2014-03-23 MED ORDER — PROPOFOL 10 MG/ML IV BOLUS
INTRAVENOUS | Status: AC
  Filled 2014-03-23: qty 20

## 2014-03-23 MED ORDER — ONDANSETRON HCL 4 MG/2ML IJ SOLN: 4.0000 mg | Freq: Four times a day (QID) | INTRAMUSCULAR | Status: DC | PRN

## 2014-03-23 MED ORDER — PROPOFOL 10 MG/ML IV BOLUS
INTRAVENOUS | Status: DC | PRN
  Administered 2014-03-23: 20 mg via INTRAVENOUS

## 2014-03-23 MED ORDER — ONDANSETRON HCL 4 MG/2ML IJ SOLN
INTRAMUSCULAR | Status: DC | PRN
  Administered 2014-03-23: 4 mg via INTRAVENOUS

## 2014-03-23 MED ORDER — PROPOFOL INFUSION 10 MG/ML OPTIME
INTRAVENOUS | Status: DC | PRN
  Administered 2014-03-23: 25 ug/kg/min via INTRAVENOUS

## 2014-03-23 MED ORDER — FENTANYL CITRATE 0.05 MG/ML IJ SOLN
INTRAMUSCULAR | Status: AC
  Administered 2014-03-23: 50 ug
  Filled 2014-03-23: qty 2

## 2014-03-23 MED ORDER — OXYCODONE HCL 5 MG PO TABS: 5.0000 mg | ORAL_TABLET | Freq: Once | ORAL | Status: DC | PRN

## 2014-03-23 MED ORDER — MUPIROCIN 2 % EX OINT
TOPICAL_OINTMENT | CUTANEOUS | Status: AC
  Filled 2014-03-23: qty 22

## 2014-03-23 MED ORDER — OXYCODONE-ACETAMINOPHEN 5-325 MG PO TABS
1.0000 | ORAL_TABLET | ORAL | Status: DC | PRN
  Administered 2014-03-23 – 2014-03-24 (×5): 2 via ORAL
  Filled 2014-03-23 (×5): qty 2

## 2014-03-23 MED ORDER — POLYETHYLENE GLYCOL 3350 17 G PO PACK: 17.0000 g | PACK | Freq: Every day | ORAL | Status: DC | PRN

## 2014-03-23 MED ORDER — FENTANYL CITRATE 0.05 MG/ML IJ SOLN: 25.0000 ug | INTRAMUSCULAR | Status: DC | PRN

## 2014-03-23 MED ORDER — MIDAZOLAM HCL 2 MG/2ML IJ SOLN
INTRAMUSCULAR | Status: AC
  Filled 2014-03-23: qty 2

## 2014-03-23 MED ORDER — CEFAZOLIN SODIUM 1-5 GM-% IV SOLN
1.0000 g | Freq: Four times a day (QID) | INTRAVENOUS | Status: AC
  Administered 2014-03-23 – 2014-03-24 (×3): 1 g via INTRAVENOUS
  Filled 2014-03-23 (×3): qty 50

## 2014-03-23 SURGICAL SUPPLY — 29 items
BANDAGE GAUZE ELAST BULKY 4 IN (GAUZE/BANDAGES/DRESSINGS) ×2 IMPLANT
BLADE SAW SGTL MED 73X18.5 STR (BLADE) IMPLANT
BNDG COHESIVE 4X5 TAN STRL (GAUZE/BANDAGES/DRESSINGS) ×2 IMPLANT
BNDG GAUZE ELAST 4 BULKY (GAUZE/BANDAGES/DRESSINGS) ×1 IMPLANT
COVER SURGICAL LIGHT HANDLE (MISCELLANEOUS) ×2 IMPLANT
DRAPE U-SHAPE 47X51 STRL (DRAPES) ×4 IMPLANT
DRSG ADAPTIC 3X8 NADH LF (GAUZE/BANDAGES/DRESSINGS) ×2 IMPLANT
DRSG PAD ABDOMINAL 8X10 ST (GAUZE/BANDAGES/DRESSINGS) ×3 IMPLANT
DURAPREP 26ML APPLICATOR (WOUND CARE) ×2 IMPLANT
ELECT REM PT RETURN 9FT ADLT (ELECTROSURGICAL) ×2
ELECTRODE REM PT RTRN 9FT ADLT (ELECTROSURGICAL) ×1 IMPLANT
GLOVE BIOGEL PI IND STRL 9 (GLOVE) ×1 IMPLANT
GLOVE BIOGEL PI INDICATOR 9 (GLOVE) ×1
GLOVE SURG ORTHO 9.0 STRL STRW (GLOVE) ×2 IMPLANT
GOWN STRL REUS W/ TWL XL LVL3 (GOWN DISPOSABLE) ×2 IMPLANT
GOWN STRL REUS W/TWL XL LVL3 (GOWN DISPOSABLE) ×4
KIT BASIN OR (CUSTOM PROCEDURE TRAY) ×2 IMPLANT
KIT ROOM TURNOVER OR (KITS) ×2 IMPLANT
NS IRRIG 1000ML POUR BTL (IV SOLUTION) ×2 IMPLANT
PACK ORTHO EXTREMITY (CUSTOM PROCEDURE TRAY) ×2 IMPLANT
PAD ARMBOARD 7.5X6 YLW CONV (MISCELLANEOUS) ×4 IMPLANT
SPONGE GAUZE 4X4 12PLY (GAUZE/BANDAGES/DRESSINGS) ×2 IMPLANT
SPONGE LAP 18X18 X RAY DECT (DISPOSABLE) ×2 IMPLANT
STOCKINETTE IMPERVIOUS LG (DRAPES) IMPLANT
SUT ETHILON 2 0 PSLX (SUTURE) ×4 IMPLANT
TOWEL OR 17X24 6PK STRL BLUE (TOWEL DISPOSABLE) ×2 IMPLANT
TOWEL OR 17X26 10 PK STRL BLUE (TOWEL DISPOSABLE) ×2 IMPLANT
UNDERPAD 30X30 INCONTINENT (UNDERPADS AND DIAPERS) ×2 IMPLANT
WATER STERILE IRR 1000ML POUR (IV SOLUTION) ×2 IMPLANT

## 2014-03-23 NOTE — Anesthesia Preprocedure Evaluation (Addendum)
Anesthesia Evaluation  Patient identified by MRN, date of birth, ID band Patient awake    Reviewed: Allergy & Precautions, H&P , NPO status , Patient's Chart, lab work & pertinent test results, reviewed documented beta blocker date and time   History of Anesthesia Complications Negative for: history of anesthetic complications  Airway Mallampati: II TM Distance: >3 FB Neck ROM: Full    Dental  (+) Edentulous Upper, Missing,    Pulmonary neg pulmonary ROS, neg sleep apnea, neg COPD breath sounds clear to auscultation        Cardiovascular hypertension, Pt. on medications and Pt. on home beta blockers - angina+ CAD, + Past MI, + Peripheral Vascular Disease and +CHF + dysrhythmias Atrial Fibrillation Rhythm:Regular     Neuro/Psych PSYCHIATRIC DISORDERS Anxiety Depression negative neurological ROS     GI/Hepatic negative GI ROS, Neg liver ROS,   Endo/Other  diabetes, Type 2, Insulin Dependent  Renal/GU Renal InsufficiencyRenal disease     Musculoskeletal   Abdominal   Peds  Hematology  (+) anemia ,   Anesthesia Other Findings   Reproductive/Obstetrics                          Anesthesia Physical Anesthesia Plan  ASA: III  Anesthesia Plan: Regional and MAC   Post-op Pain Management: MAC Combined w/ Regional for Post-op pain   Induction:   Airway Management Planned: Natural Airway  Additional Equipment: None  Intra-op Plan:   Post-operative Plan:   Informed Consent: I have reviewed the patients History and Physical, chart, labs and discussed the procedure including the risks, benefits and alternatives for the proposed anesthesia with the patient or authorized representative who has indicated his/her understanding and acceptance.   Dental advisory given  Plan Discussed with: CRNA and Surgeon  Anesthesia Plan Comments:         Anesthesia Quick Evaluation

## 2014-03-23 NOTE — H&P (Signed)
[name] is an 68 y.o. female.   Chief Complaint: Osteomyelitis ulceration left foot MTP joint HPI: Patient is a 68 year old woman diabetic insensate neuropathy peripheral vascular disease who has undergone foot salvage intervention who presents at this time with progressive destructive changes of the MTP joint left foot with gangrenous changes  Past Medical History  Diagnosis Date  . Diabetes mellitus, type 2   . Essential hypertension, benign   . Chronic systolic heart failure   . Coronary atherosclerosis of native coronary artery     Nonobstructive  . Nonischemic cardiomyopathy     LVEF 20% up to 45%  . PAD (peripheral artery disease)     Left foot gangrene  . Anxiety   . Depression   . MRSA bacteremia January 2015    Associated with probable tricuspid valve vegetation, ICD system extracted February 2050  . Atrial fibrillation     Past Surgical History  Procedure Laterality Date  . Abdominal hysterectomy    . Left shoulder surgery      Rotator Cuff  . Breast lumpectomy      Bil  . Cardiac defibrillator placement      Medtronic [id number]   . Exploratory laparotomy with lysis of adhesions    . Bladder surgery  2011 BENIGN MASS REMOVED FROM BLADDER  . Eye surgery  BILATERAL CATARACTS REMOVAL  10 YEARS AGO PER PATIENT  . Pr vein bypass graft,aorto-fem-pop  05-2011    Left popliteal- PTA graft   . Viterectomy  11/10/2012    OD   Dr Zigmund Daniel  . 25 gauge pars plana vitrectomy with 20 gauge mvr port for macular hole Right 11/10/2012    Procedure: 69 GAUGE PARS PLANA VITRECTOMY WITH 20 GAUGE MVR PORT FOR MACULAR HOLE;  Surgeon: Hayden Pedro, MD;  Location: [place];  Service: Ophthalmology;  Laterality: Right;  . Gas insertion Right 11/10/2012    Procedure: INSERTION OF GAS;  Surgeon: Hayden Pedro, MD;  Location: [place];  Service: Ophthalmology;  Laterality: Right;  C3F8  . Serum patch Right 11/10/2012    Procedure: SERUM PATCH;  Surgeon: Hayden Pedro, MD;  Location: [place];   Service: Ophthalmology;  Laterality: Right;  . 25 gauge pars plana vitrectomy with 20 gauge mvr port for macular hole Right 05/09/2013    Procedure: 19 GAUGE PARS PLANA VITRECTOMY WITH 20 GAUGE MVR PORT FOR MACULAR HOLE;  Surgeon: Hayden Pedro, MD;  Location: [place];  Service: Ophthalmology;  Laterality: Right;  . Gas/fluid exchange Right 05/09/2013    Procedure: GAS/FLUID EXCHANGE;  Surgeon: Hayden Pedro, MD;  Location: [place];  Service: Ophthalmology;  Laterality: Right;  C3F8   . Serum patch Right 05/09/2013    Procedure: SERUM PATCH;  Surgeon: Hayden Pedro, MD;  Location: [place];  Service: Ophthalmology;  Laterality: Right;  . Laser photo ablation Right 05/09/2013    Procedure: LASER PHOTO ABLATION;  Surgeon: Hayden Pedro, MD;  Location: [place];  Service: Ophthalmology;  Laterality: Right;  Endolaser   . Membrane peel Right 05/09/2013    Procedure: MEMBRANE PEEL;  Surgeon: Hayden Pedro, MD;  Location: [place];  Service: Ophthalmology;  Laterality: Right;  . Icd lead removal N/A 11/09/2013    Procedure: ICD LEAD REMOVAL;  Surgeon: Evans Lance, MD;  Location: [place];  Service: Cardiovascular;  Laterality: N/A;  . Central venous catheter insertion Right 11/09/2013    Procedure: INSERTION CENTRAL LINE ADULT;  Surgeon: Evans Lance, MD;  Location[facility];  Service: Cardiovascular;  Laterality: Right;    Family History  Problem Relation Age of Onset  . Cirrhosis Father   . Cancer Mother     Ovarian  . Ovarian cancer Mother   . Hypertension Mother    Social History:  reports that she has never smoked. She has never used smokeless tobacco. She reports that she does not drink alcohol or use illicit drugs.  Allergies:  Allergies  Allergen Reactions  . Hydrocodone-Acetaminophen Other (See Comments)    hallucinations    No prescriptions prior to admission    No results found for this or any previous visit (from the past 48 hour(s)). No results found.  Review of Systems  All other  systems reviewed and are negative.   There were no vitals taken for this visit. Physical Exam  On examination patient has ulceration and gangrenous changes of the great toe left foot. Radiograph showed destructive osteomyelitis of the MTP joint. Patient has monophasic dorsalis pedis pulse. Assessment/Plan Assessment: Osteomyelitis ulceration left great toe MTP joint.  Plan: Will plan for left foot first ray amputation. Risks and benefits were discussed including nonhealing of the wound and need for additional surgery. Patient states she understands was to proceed at this time.  DUDA,MARCUS V 03/23/2014, 6:43 AM

## 2014-03-23 NOTE — Op Note (Signed)
03/23/2014  3:28 PM  PATIENT:  [name]    PRE-OPERATIVE DIAGNOSIS:  Gangrene, Osteomyelitis Left Great Toe  POST-OPERATIVE DIAGNOSIS:  Same  PROCEDURE:  Left Foot 1st Ray Amputation  SURGEON:  Newt Minion, MD  PHYSICIAN ASSISTANT:None ANESTHESIA:   General  PREOPERATIVE INDICATIONS:  [name] is a  68 y.o. female with a diagnosis of Gangrene, Osteomyelitis Left Great Toe who failed conservative measures and elected for surgical management.    The risks benefits and alternatives were discussed with the patient preoperatively including but not limited to the risks of infection, bleeding, nerve injury, cardiopulmonary complications, the need for revision surgery, among others, and the patient was willing to proceed.  OPERATIVE IMPLANTS: None  OPERATIVE FINDINGS: Good petechial bleeding  OPERATIVE PROCEDURE: Patient was brought to the operating room after a regional block. After adequate levels and anesthesia were obtained patient's left lower extremity was prepped using DuraPrep draped into a sterile field. A racquet incision was made around the great toe and first metatarsal. This was resected as one block of tissue. Electrocautery was used for hemostasis. The wound was irrigated with normal saline. The wound edges were trimmed back until there was good petechial bleeding. The skin was closed using 2-0 nylon. The wound was covered with a sterile compressive dressing. Plan for overnight observation discharged to home start nitroglycerin patch application by the patient tomorrow.

## 2014-03-23 NOTE — Anesthesia Postprocedure Evaluation (Signed)
  Anesthesia Post-op Note  Patient: [name]  Procedure(s) Performed: Procedure(s) with comments: Left Foot 1st Ray Amputation (Left) - Left Foot 1st Ray Amputation  Patient Location: PACU  Anesthesia Type:General  Level of Consciousness: awake, alert , oriented and patient cooperative  Airway and Oxygen Therapy: Patient Spontanous Breathing  Post-op Pain: mild  Post-op Assessment: Post-op Vital signs reviewed, Patient's Cardiovascular Status Stable, Respiratory Function Stable, Patent Airway and No signs of Nausea or vomiting  Post-op Vital Signs: Reviewed and stable  Last Vitals:  Filed Vitals:   03/23/14 1615  BP: 171/54  Pulse: 57  Temp:   Resp: 19    Complications: No apparent anesthesia complications

## 2014-03-23 NOTE — Discharge Instructions (Signed)
Apply nitroglycerin patch to the front of the ankle and change daily. May cut dressing down at the ankle to allow for room to apply the nitroglycerin patch.

## 2014-03-23 NOTE — Progress Notes (Signed)
Orthopedic Tech Progress Note Patient Details:  [name] May 06, 1946 [id number]  Ortho Devices Type of Ortho Device: Postop shoe/boot Ortho Device/Splint Location: LLE Ortho Device/Splint Interventions: Application   Braulio Bosch 03/23/2014, 4:21 PM

## 2014-03-23 NOTE — Transfer of Care (Signed)
Immediate Anesthesia Transfer of Care Note  Patient: [name]  Procedure(s) Performed: Procedure(s) with comments: Left Foot 1st Ray Amputation (Left) - Left Foot 1st Ray Amputation  Patient Location: PACU  Anesthesia Type:MAC and Regional  Level of Consciousness: awake, alert , oriented and patient cooperative  Airway & Oxygen Therapy: Patient Spontanous Breathing and Patient connected to nasal cannula oxygen  Post-op Assessment: Report given to PACU RN, Post -op Vital signs reviewed and stable and Patient moving all extremities  Post vital signs: Reviewed and stable  Complications: No apparent anesthesia complications

## 2014-03-23 NOTE — Anesthesia Procedure Notes (Signed)
Anesthesia Regional Block:  Popliteal block  Pre-Anesthetic Checklist: ,, timeout performed, Correct Patient, Correct Site, Correct Laterality, Correct Procedure, Correct Position, site marked, Risks and benefits discussed,  Surgical consent,  Pre-op evaluation,  At surgeon's request and post-op pain management  Laterality: Left  Prep: chloraprep       Needles:  Injection technique: Single-shot  Needle Type: Stimiplex          Additional Needles:  Procedures: ultrasound guided (picture in chart) and nerve stimulator Popliteal block  Nerve Stimulator or Paresthesia:  Response: eversion, 0.4 mA,   Additional Responses:   Narrative:  Start time: 03/23/2014 2:20 PM End time: 03/23/2014 2:20 PM Injection made incrementally with aspirations every 5 mL.  Performed by: Personally   Additional Notes: Risks, benefits and alternative to block explained extensively.  Patient tolerated procedure well, without complications.

## 2014-03-24 LAB — GLUCOSE, CAPILLARY
GLUCOSE-CAPILLARY: 55 mg/dL — AB (ref 70–99)
GLUCOSE-CAPILLARY: 55 mg/dL — AB (ref 70–99)
GLUCOSE-CAPILLARY: 89 mg/dL (ref 70–99)

## 2014-03-24 NOTE — Progress Notes (Signed)
PT Cancellation Note  Patient Details Name: [name] MRN: [id number] DOB: 20-Aug-1946   Cancelled Treatment:     Patient DC from hospital prior to PT evaluation completion.   Zenia Resides, Ty L 03/24/2014, 12:04 PM

## 2014-03-24 NOTE — Progress Notes (Signed)
Pt stable Dressing dry Dc today - added robaxin to dc meds

## 2014-03-24 NOTE — Progress Notes (Addendum)
Hypoglycemic Event  CBG: 55 @ 0628    Treatment: 15 GM carbohydrate snack  Symptoms: Sweaty and Shaky  Follow-up CBG: Time: 0641 CBG Result: 55  Possible Reasons for Event: Unknown  Comments/MD notified: pt continuing to eat snack; will recheck in 15 minutes.   Addendum: CBG rechecked at 0700. CBG 89 at this time.    Orlean Patten A  Remember to initiate Hypoglycemia Order Set & complete

## 2014-03-26 LAB — GLUCOSE, CAPILLARY: Glucose-Capillary: 108 mg/dL — ABNORMAL HIGH (ref 70–99)

## 2014-03-26 NOTE — Care Management Note (Signed)
CARE MANAGEMENT NOTE 03/26/2014  Patient:  [name], [name]   Account Number:  [id number]  Date Initiated:  03/26/2014  Documentation initiated by:  Ricki Miller  Subjective/Objective Assessment:   68 yr old female s/p amutation of left foot 1st ray.     Action/Plan:   Patient discharged home prior to PT seeing and prior to CM seeing. No MD order for home health or DME.   Anticipated DC Date:  03/24/2014   Anticipated DC Plan:  [place]  CM consult      Choice offered to / List presented to:             Status of service:  Completed, signed off Medicare Important Message given?   (If response is "NO", the following Medicare IM given date fields will be blank) Date Medicare IM given:   Date Additional Medicare IM given:    Discharge Disposition:  HOME/SELF CARE

## 2014-03-27 ENCOUNTER — Other Ambulatory Visit (HOSPITAL_COMMUNITY): Payer: Medicare Other

## 2014-03-27 ENCOUNTER — Ambulatory Visit: Payer: Medicare Other | Admitting: Vascular Surgery

## 2014-03-27 ENCOUNTER — Encounter (HOSPITAL_COMMUNITY): Payer: Self-pay | Admitting: Orthopedic Surgery

## 2014-03-27 ENCOUNTER — Encounter (HOSPITAL_COMMUNITY): Payer: Medicare Other

## 2014-04-07 ENCOUNTER — Other Ambulatory Visit: Payer: Self-pay | Admitting: Cardiology

## 2014-04-12 ENCOUNTER — Encounter (HOSPITAL_COMMUNITY): Payer: Self-pay | Admitting: *Deleted

## 2014-04-12 ENCOUNTER — Other Ambulatory Visit (HOSPITAL_COMMUNITY): Payer: Self-pay | Admitting: Orthopedic Surgery

## 2014-04-12 LAB — POCT I-STAT 4, (NA,K, GLUC, HGB,HCT)
Glucose, Bld: 127 mg/dL — ABNORMAL HIGH (ref 70–99)
Glucose, Bld: 145 mg/dL — ABNORMAL HIGH (ref 70–99)
HCT: 21 % — ABNORMAL LOW (ref 36.0–46.0)
HEMATOCRIT: 28 % — AB (ref 36.0–46.0)
Hemoglobin: 7.1 g/dL — ABNORMAL LOW (ref 12.0–15.0)
Hemoglobin: 9.5 g/dL — ABNORMAL LOW (ref 12.0–15.0)
POTASSIUM: 4.3 meq/L (ref 3.7–5.3)
Potassium: 5.5 mEq/L — ABNORMAL HIGH (ref 3.7–5.3)
SODIUM: 142 meq/L (ref 137–147)
Sodium: 141 mEq/L (ref 137–147)

## 2014-04-12 MED ORDER — CEFAZOLIN SODIUM-DEXTROSE 2-3 GM-% IV SOLR
2.0000 g | INTRAVENOUS | Status: AC
  Administered 2014-04-13: 2 g via INTRAVENOUS
  Filled 2014-04-12: qty 50

## 2014-04-13 ENCOUNTER — Encounter (HOSPITAL_COMMUNITY): Payer: Medicare Other | Admitting: Anesthesiology

## 2014-04-13 ENCOUNTER — Encounter (HOSPITAL_COMMUNITY): Payer: Self-pay | Admitting: Pharmacy Technician

## 2014-04-13 ENCOUNTER — Encounter (HOSPITAL_COMMUNITY): Payer: Self-pay | Admitting: *Deleted

## 2014-04-13 ENCOUNTER — Inpatient Hospital Stay (HOSPITAL_COMMUNITY): Payer: Medicare Other | Admitting: Anesthesiology

## 2014-04-13 ENCOUNTER — Encounter (HOSPITAL_COMMUNITY)
Admission: RE | Disposition: A | Discharge status: Home Health | Payer: Medicare Other | Source: Ambulatory Visit | Attending: Orthopedic Surgery

## 2014-04-13 ENCOUNTER — Inpatient Hospital Stay (HOSPITAL_COMMUNITY)
Admission: RE | Admit: 2014-04-13 | Discharge: 2014-04-16 | DRG: 240 | Disposition: A | Discharge status: Home Health | Payer: Medicare Other | Source: Ambulatory Visit | Attending: Orthopedic Surgery | Admitting: Orthopedic Surgery

## 2014-04-13 DIAGNOSIS — E1159 Type 2 diabetes mellitus with other circulatory complications: Principal | ICD-10-CM | POA: Diagnosis present

## 2014-04-13 DIAGNOSIS — I4891 Unspecified atrial fibrillation: Secondary | ICD-10-CM | POA: Diagnosis present

## 2014-04-13 DIAGNOSIS — I509 Heart failure, unspecified: Secondary | ICD-10-CM | POA: Diagnosis present

## 2014-04-13 DIAGNOSIS — F3289 Other specified depressive episodes: Secondary | ICD-10-CM | POA: Diagnosis present

## 2014-04-13 DIAGNOSIS — S98119A Complete traumatic amputation of unspecified great toe, initial encounter: Secondary | ICD-10-CM

## 2014-04-13 DIAGNOSIS — F329 Major depressive disorder, single episode, unspecified: Secondary | ICD-10-CM | POA: Diagnosis present

## 2014-04-13 DIAGNOSIS — Z95810 Presence of automatic (implantable) cardiac defibrillator: Secondary | ICD-10-CM

## 2014-04-13 DIAGNOSIS — Z7982 Long term (current) use of aspirin: Secondary | ICD-10-CM

## 2014-04-13 DIAGNOSIS — Z89512 Acquired absence of left leg below knee: Secondary | ICD-10-CM

## 2014-04-13 DIAGNOSIS — F411 Generalized anxiety disorder: Secondary | ICD-10-CM | POA: Diagnosis present

## 2014-04-13 DIAGNOSIS — IMO0002 Reserved for concepts with insufficient information to code with codable children: Secondary | ICD-10-CM

## 2014-04-13 DIAGNOSIS — I5022 Chronic systolic (congestive) heart failure: Secondary | ICD-10-CM | POA: Diagnosis present

## 2014-04-13 DIAGNOSIS — Z79899 Other long term (current) drug therapy: Secondary | ICD-10-CM

## 2014-04-13 DIAGNOSIS — I2510 Atherosclerotic heart disease of native coronary artery without angina pectoris: Secondary | ICD-10-CM | POA: Diagnosis present

## 2014-04-13 DIAGNOSIS — I96 Gangrene, not elsewhere classified: Secondary | ICD-10-CM | POA: Diagnosis present

## 2014-04-13 DIAGNOSIS — I10 Essential (primary) hypertension: Secondary | ICD-10-CM | POA: Diagnosis present

## 2014-04-13 DIAGNOSIS — I798 Other disorders of arteries, arterioles and capillaries in diseases classified elsewhere: Secondary | ICD-10-CM | POA: Diagnosis present

## 2014-04-13 DIAGNOSIS — I428 Other cardiomyopathies: Secondary | ICD-10-CM | POA: Diagnosis present

## 2014-04-13 DIAGNOSIS — Z794 Long term (current) use of insulin: Secondary | ICD-10-CM

## 2014-04-13 HISTORY — DX: Anemia, unspecified: D64.9

## 2014-04-13 HISTORY — PX: AMPUTATION: SHX166

## 2014-04-13 LAB — COMPREHENSIVE METABOLIC PANEL
ALT: 8 U/L (ref 0–35)
AST: 17 U/L (ref 0–37)
Albumin: 3.1 g/dL — ABNORMAL LOW (ref 3.5–5.2)
Alkaline Phosphatase: 78 U/L (ref 39–117)
Anion gap: 19 — ABNORMAL HIGH (ref 5–15)
BUN: 60 mg/dL — ABNORMAL HIGH (ref 6–23)
CHLORIDE: 97 meq/L (ref 96–112)
CO2: 23 meq/L (ref 19–32)
Calcium: 9.2 mg/dL (ref 8.4–10.5)
Creatinine, Ser: 2.29 mg/dL — ABNORMAL HIGH (ref 0.50–1.10)
GFR calc Af Amer: 24 mL/min — ABNORMAL LOW (ref >90)
GFR, EST NON AFRICAN AMERICAN: 21 mL/min — AB (ref >90)
Glucose, Bld: 54 mg/dL — ABNORMAL LOW (ref 70–99)
Potassium: 3.8 mEq/L (ref 3.7–5.3)
Sodium: 139 mEq/L (ref 137–147)
Total Bilirubin: 0.3 mg/dL (ref 0.3–1.2)
Total Protein: 9.2 g/dL — ABNORMAL HIGH (ref 6.0–8.3)

## 2014-04-13 LAB — CBC
HCT: 26.5 % — ABNORMAL LOW (ref 36.0–46.0)
Hemoglobin: 8.8 g/dL — ABNORMAL LOW (ref 12.0–15.0)
MCH: 28.8 pg (ref 26.0–34.0)
MCHC: 33.2 g/dL (ref 30.0–36.0)
MCV: 86.6 fL (ref 78.0–100.0)
PLATELETS: 341 10*3/uL (ref 150–400)
RBC: 3.06 MIL/uL — AB (ref 3.87–5.11)
RDW: 15.2 % (ref 11.5–15.5)
WBC: 11.7 10*3/uL — AB (ref 4.0–10.5)

## 2014-04-13 LAB — APTT: aPTT: 38 seconds — ABNORMAL HIGH (ref 24–37)

## 2014-04-13 LAB — GLUCOSE, CAPILLARY
GLUCOSE-CAPILLARY: 96 mg/dL (ref 70–99)
GLUCOSE-CAPILLARY: 71 mg/dL (ref 70–99)
GLUCOSE-CAPILLARY: 77 mg/dL (ref 70–99)
Glucose-Capillary: 55 mg/dL — ABNORMAL LOW (ref 70–99)
Glucose-Capillary: 150 mg/dL — ABNORMAL HIGH (ref 70–99)

## 2014-04-13 LAB — PROTIME-INR
INR: 1.27 (ref 0.00–1.49)
Prothrombin Time: 15.9 seconds — ABNORMAL HIGH (ref 11.6–15.2)

## 2014-04-13 SURGERY — AMPUTATION BELOW KNEE
Anesthesia: General | Site: Foot | Laterality: Left

## 2014-04-13 MED ORDER — INSULIN ASPART 100 UNIT/ML ~~LOC~~ SOLN
4.0000 [IU] | Freq: Three times a day (TID) | SUBCUTANEOUS | Status: DC
Start: 2014-04-14 — End: 2014-04-16
  Administered 2014-04-15 – 2014-04-16 (×3): 4 [IU] via SUBCUTANEOUS

## 2014-04-13 MED ORDER — HYDRALAZINE HCL 50 MG PO TABS
100.0000 mg | ORAL_TABLET | Freq: Three times a day (TID) | ORAL | Status: DC
  Administered 2014-04-13 – 2014-04-16 (×8): 100 mg via ORAL
  Filled 2014-04-13 (×10): qty 2

## 2014-04-13 MED ORDER — ONDANSETRON HCL 4 MG/2ML IJ SOLN
INTRAMUSCULAR | Status: DC | PRN
  Administered 2014-04-13: 4 mg via INTRAVENOUS

## 2014-04-13 MED ORDER — FENTANYL CITRATE 0.05 MG/ML IJ SOLN
50.0000 ug | INTRAMUSCULAR | Status: DC | PRN
  Administered 2014-04-13 (×2): 50 ug via INTRAVENOUS

## 2014-04-13 MED ORDER — GLYCOPYRROLATE 0.2 MG/ML IJ SOLN
INTRAMUSCULAR | Status: AC
  Administered 2014-04-13: 0.4 mg via INTRAVENOUS
  Filled 2014-04-13: qty 2

## 2014-04-13 MED ORDER — ASPIRIN 81 MG PO CHEW
81.0000 mg | CHEWABLE_TABLET | Freq: Every day | ORAL | Status: DC
  Administered 2014-04-14 – 2014-04-16 (×3): 81 mg via ORAL
  Filled 2014-04-13 (×3): qty 1

## 2014-04-13 MED ORDER — SODIUM BICARBONATE 650 MG PO TABS
650.0000 mg | ORAL_TABLET | Freq: Three times a day (TID) | ORAL | Status: DC
  Administered 2014-04-13 – 2014-04-16 (×8): 650 mg via ORAL
  Filled 2014-04-13 (×10): qty 1

## 2014-04-13 MED ORDER — DEXTROSE 50 % IV SOLN
INTRAVENOUS | Status: DC | PRN
  Administered 2014-04-13 (×2): 5 mL via INTRAVENOUS

## 2014-04-13 MED ORDER — ONDANSETRON HCL 4 MG PO TABS: 4.0000 mg | ORAL_TABLET | Freq: Four times a day (QID) | ORAL | Status: DC | PRN

## 2014-04-13 MED ORDER — HYDROMORPHONE HCL PF 1 MG/ML IJ SOLN
0.5000 mg | INTRAMUSCULAR | Status: DC | PRN
  Administered 2014-04-13: 1 mg via INTRAVENOUS
  Filled 2014-04-13: qty 1

## 2014-04-13 MED ORDER — METOCLOPRAMIDE HCL 5 MG/ML IJ SOLN: 5.0000 mg | Freq: Three times a day (TID) | INTRAMUSCULAR | Status: DC | PRN

## 2014-04-13 MED ORDER — FENTANYL CITRATE 0.05 MG/ML IJ SOLN
INTRAMUSCULAR | Status: AC
  Administered 2014-04-13: 50 ug via INTRAVENOUS
  Filled 2014-04-13: qty 2

## 2014-04-13 MED ORDER — HYDROMORPHONE HCL PF 1 MG/ML IJ SOLN: 0.2500 mg | INTRAMUSCULAR | Status: DC | PRN

## 2014-04-13 MED ORDER — DEXTROSE 50 % IV SOLN
6.2500 g | INTRAVENOUS | Status: AC | PRN
  Administered 2014-04-13 (×2): 6.25 g via INTRAVENOUS

## 2014-04-13 MED ORDER — METOCLOPRAMIDE HCL 10 MG PO TABS: 5.0000 mg | ORAL_TABLET | Freq: Three times a day (TID) | ORAL | Status: DC | PRN

## 2014-04-13 MED ORDER — DOCUSATE SODIUM 100 MG PO CAPS
100.0000 mg | ORAL_CAPSULE | Freq: Two times a day (BID) | ORAL | Status: DC
  Administered 2014-04-13 – 2014-04-15 (×5): 100 mg via ORAL
  Filled 2014-04-13 (×7): qty 1

## 2014-04-13 MED ORDER — AMLODIPINE BESYLATE 10 MG PO TABS
10.0000 mg | ORAL_TABLET | Freq: Every day | ORAL | Status: DC
  Administered 2014-04-14 – 2014-04-16 (×3): 10 mg via ORAL
  Filled 2014-04-13 (×3): qty 1

## 2014-04-13 MED ORDER — FENTANYL CITRATE 0.05 MG/ML IJ SOLN
INTRAMUSCULAR | Status: AC
  Filled 2014-04-13: qty 5

## 2014-04-13 MED ORDER — DOCUSATE SODIUM 100 MG PO CAPS: 100.0000 mg | ORAL_CAPSULE | Freq: Two times a day (BID) | ORAL | Status: DC

## 2014-04-13 MED ORDER — METHOCARBAMOL 500 MG PO TABS
500.0000 mg | ORAL_TABLET | Freq: Four times a day (QID) | ORAL | Status: DC | PRN
  Administered 2014-04-14 – 2014-04-16 (×9): 500 mg via ORAL
  Filled 2014-04-13 (×9): qty 1

## 2014-04-13 MED ORDER — INSULIN ASPART 100 UNIT/ML ~~LOC~~ SOLN
0.0000 [IU] | Freq: Three times a day (TID) | SUBCUTANEOUS | Status: DC
  Administered 2014-04-14: 3 [IU] via SUBCUTANEOUS
  Administered 2014-04-15 – 2014-04-16 (×2): 2 [IU] via SUBCUTANEOUS

## 2014-04-13 MED ORDER — CARVEDILOL 25 MG PO TABS
25.0000 mg | ORAL_TABLET | Freq: Two times a day (BID) | ORAL | Status: DC
  Administered 2014-04-13 – 2014-04-16 (×6): 25 mg via ORAL
  Filled 2014-04-13 (×8): qty 1

## 2014-04-13 MED ORDER — ONDANSETRON HCL 4 MG/2ML IJ SOLN: 4.0000 mg | Freq: Four times a day (QID) | INTRAMUSCULAR | Status: DC | PRN

## 2014-04-13 MED ORDER — LACTATED RINGERS IV SOLN
INTRAVENOUS | Status: DC
  Administered 2014-04-13: 16:00:00 via INTRAVENOUS

## 2014-04-13 MED ORDER — MAGNESIUM CITRATE PO SOLN: 1.0000 | Freq: Once | ORAL | Status: AC | PRN

## 2014-04-13 MED ORDER — CEFAZOLIN SODIUM 1-5 GM-% IV SOLN
1.0000 g | Freq: Two times a day (BID) | INTRAVENOUS | Status: AC
  Administered 2014-04-13 – 2014-04-14 (×2): 1 g via INTRAVENOUS
  Filled 2014-04-13 (×3): qty 50

## 2014-04-13 MED ORDER — LIDOCAINE HCL (CARDIAC) 10 MG/ML IV SOLN
INTRAVENOUS | Status: DC | PRN
  Administered 2014-04-13: 100 mg via INTRAVENOUS

## 2014-04-13 MED ORDER — DEXTROSE 50 % IV SOLN
INTRAVENOUS | Status: AC
  Administered 2014-04-13: 6.25 g via INTRAVENOUS
  Filled 2014-04-13: qty 50

## 2014-04-13 MED ORDER — PROPOFOL 10 MG/ML IV BOLUS
INTRAVENOUS | Status: AC
  Filled 2014-04-13: qty 20

## 2014-04-13 MED ORDER — SODIUM CHLORIDE 0.9 % IV SOLN
INTRAVENOUS | Status: DC
  Administered 2014-04-13: 21:00:00 via INTRAVENOUS

## 2014-04-13 MED ORDER — LIDOCAINE HCL (CARDIAC) 20 MG/ML IV SOLN
INTRAVENOUS | Status: AC
  Filled 2014-04-13: qty 5

## 2014-04-13 MED ORDER — INSULIN GLARGINE 100 UNIT/ML ~~LOC~~ SOLN
35.0000 [IU] | Freq: Every day | SUBCUTANEOUS | Status: DC
  Administered 2014-04-13 – 2014-04-16 (×4): 35 [IU] via SUBCUTANEOUS
  Filled 2014-04-13 (×4): qty 0.35

## 2014-04-13 MED ORDER — BISACODYL 5 MG PO TBEC: 5.0000 mg | DELAYED_RELEASE_TABLET | Freq: Every day | ORAL | Status: DC | PRN

## 2014-04-13 MED ORDER — ONDANSETRON HCL 4 MG/2ML IJ SOLN
INTRAMUSCULAR | Status: AC
  Filled 2014-04-13: qty 2

## 2014-04-13 MED ORDER — ONDANSETRON HCL 4 MG/2ML IJ SOLN: 4.0000 mg | Freq: Once | INTRAMUSCULAR | Status: DC | PRN

## 2014-04-13 MED ORDER — FENTANYL CITRATE 0.05 MG/ML IJ SOLN
INTRAMUSCULAR | Status: DC | PRN
  Administered 2014-04-13 (×3): 50 ug via INTRAVENOUS

## 2014-04-13 MED ORDER — GLYCOPYRROLATE 0.2 MG/ML IJ SOLN
0.4000 mg | Freq: Once | INTRAMUSCULAR | Status: AC
  Administered 2014-04-13: 0.4 mg via INTRAVENOUS

## 2014-04-13 MED ORDER — OXYCODONE-ACETAMINOPHEN 5-325 MG PO TABS
1.0000 | ORAL_TABLET | ORAL | Status: DC | PRN
  Administered 2014-04-13: 1 via ORAL
  Administered 2014-04-13: 2 via ORAL
  Administered 2014-04-14: 1 via ORAL
  Administered 2014-04-14 – 2014-04-16 (×10): 2 via ORAL
  Filled 2014-04-13: qty 2
  Filled 2014-04-13: qty 1
  Filled 2014-04-13 (×9): qty 2
  Filled 2014-04-13: qty 1
  Filled 2014-04-13: qty 2

## 2014-04-13 MED ORDER — FUROSEMIDE 40 MG PO TABS
40.0000 mg | ORAL_TABLET | Freq: Two times a day (BID) | ORAL | Status: DC
  Administered 2014-04-14: 40 mg via ORAL
  Administered 2014-04-15 – 2014-04-16 (×2): 80 mg via ORAL
  Filled 2014-04-13 (×7): qty 2

## 2014-04-13 MED ORDER — POLYETHYLENE GLYCOL 3350 17 G PO PACK: 17.0000 g | PACK | Freq: Every day | ORAL | Status: DC | PRN

## 2014-04-13 MED ORDER — LORAZEPAM 1 MG PO TABS: 1.0000 mg | ORAL_TABLET | Freq: Four times a day (QID) | ORAL | Status: DC | PRN

## 2014-04-13 MED ORDER — PROPOFOL 10 MG/ML IV BOLUS
INTRAVENOUS | Status: DC | PRN
  Administered 2014-04-13: 130 mg via INTRAVENOUS

## 2014-04-13 MED ORDER — METHOCARBAMOL 1000 MG/10ML IJ SOLN
500.0000 mg | Freq: Four times a day (QID) | INTRAVENOUS | Status: DC | PRN
  Filled 2014-04-13: qty 5

## 2014-04-13 MED ORDER — SENNOSIDES-DOCUSATE SODIUM 8.6-50 MG PO TABS: 1.0000 | ORAL_TABLET | Freq: Every evening | ORAL | Status: DC | PRN

## 2014-04-13 MED ORDER — VITAMIN D 1000 UNITS PO TABS
1000.0000 [IU] | ORAL_TABLET | Freq: Every day | ORAL | Status: DC
  Administered 2014-04-14 – 2014-04-16 (×3): 1000 [IU] via ORAL
  Filled 2014-04-13 (×3): qty 1

## 2014-04-13 MED FILL — Sodium Chloride IV Soln 0.9%: INTRAVENOUS | Qty: 4000 | Status: AC

## 2014-04-13 MED FILL — Heparin Sodium (Porcine) Inj 1000 Unit/ML: INTRAMUSCULAR | Qty: 60 | Status: AC

## 2014-04-13 SURGICAL SUPPLY — 44 items
BANDAGE ESMARK 6X9 LF (GAUZE/BANDAGES/DRESSINGS) ×1 IMPLANT
BANDAGE GAUZE ELAST BULKY 4 IN (GAUZE/BANDAGES/DRESSINGS) ×4 IMPLANT
BLADE SAW RECIP 87.9 MT (BLADE) ×2 IMPLANT
BLADE SURG 21 STRL SS (BLADE) ×2 IMPLANT
BNDG CMPR 9X6 STRL LF SNTH (GAUZE/BANDAGES/DRESSINGS) ×1
BNDG COHESIVE 6X5 TAN STRL LF (GAUZE/BANDAGES/DRESSINGS) ×3 IMPLANT
BNDG ESMARK 6X9 LF (GAUZE/BANDAGES/DRESSINGS) ×2
BNDG GAUZE ELAST 4 BULKY (GAUZE/BANDAGES/DRESSINGS) ×1 IMPLANT
COVER SURGICAL LIGHT HANDLE (MISCELLANEOUS) ×2 IMPLANT
CUFF TOURNIQUET SINGLE 34IN LL (TOURNIQUET CUFF) IMPLANT
CUFF TOURNIQUET SINGLE 44IN (TOURNIQUET CUFF) IMPLANT
DRAIN PENROSE 1/2X12 LTX STRL (WOUND CARE) IMPLANT
DRAPE EXTREMITY T 121X128X90 (DRAPE) ×2 IMPLANT
DRAPE PROXIMA HALF (DRAPES) ×4 IMPLANT
DRAPE U-SHAPE 47X51 STRL (DRAPES) ×4 IMPLANT
DRSG ADAPTIC 3X8 NADH LF (GAUZE/BANDAGES/DRESSINGS) ×2 IMPLANT
DRSG PAD ABDOMINAL 8X10 ST (GAUZE/BANDAGES/DRESSINGS) ×2 IMPLANT
DURAPREP 26ML APPLICATOR (WOUND CARE) ×2 IMPLANT
ELECT REM PT RETURN 9FT ADLT (ELECTROSURGICAL) ×2
ELECTRODE REM PT RTRN 9FT ADLT (ELECTROSURGICAL) ×1 IMPLANT
GLOVE BIOGEL PI IND STRL 9 (GLOVE) ×1 IMPLANT
GLOVE BIOGEL PI INDICATOR 9 (GLOVE) ×1
GLOVE SURG ORTHO 9.0 STRL STRW (GLOVE) ×2 IMPLANT
GOWN STRL REUS W/ TWL XL LVL3 (GOWN DISPOSABLE) ×2 IMPLANT
GOWN STRL REUS W/TWL XL LVL3 (GOWN DISPOSABLE) ×4
KIT BASIN OR (CUSTOM PROCEDURE TRAY) ×2 IMPLANT
KIT ROOM TURNOVER OR (KITS) ×2 IMPLANT
MANIFOLD NEPTUNE II (INSTRUMENTS) ×2 IMPLANT
NS IRRIG 1000ML POUR BTL (IV SOLUTION) ×2 IMPLANT
PACK GENERAL/GYN (CUSTOM PROCEDURE TRAY) ×2 IMPLANT
PAD ARMBOARD 7.5X6 YLW CONV (MISCELLANEOUS) ×4 IMPLANT
SPONGE GAUZE 4X4 12PLY (GAUZE/BANDAGES/DRESSINGS) ×2 IMPLANT
SPONGE GAUZE 4X4 12PLY STER LF (GAUZE/BANDAGES/DRESSINGS) ×1 IMPLANT
SPONGE LAP 18X18 X RAY DECT (DISPOSABLE) IMPLANT
STAPLER VISISTAT 35W (STAPLE) IMPLANT
STOCKINETTE IMPERVIOUS LG (DRAPES) ×2 IMPLANT
SUT PDS AB 1 CT  36 (SUTURE)
SUT PDS AB 1 CT 36 (SUTURE) IMPLANT
SUT SILK 2 0 (SUTURE) ×2
SUT SILK 2-0 18XBRD TIE 12 (SUTURE) ×1 IMPLANT
TOWEL OR 17X24 6PK STRL BLUE (TOWEL DISPOSABLE) ×2 IMPLANT
TOWEL OR 17X26 10 PK STRL BLUE (TOWEL DISPOSABLE) ×2 IMPLANT
TUBE ANAEROBIC SPECIMEN COL (MISCELLANEOUS) IMPLANT
WATER STERILE IRR 1000ML POUR (IV SOLUTION) ×2 IMPLANT

## 2014-04-13 NOTE — Anesthesia Preprocedure Evaluation (Signed)
Anesthesia Evaluation  Patient identified by MRN, date of birth, ID band Patient awake    Reviewed: Allergy & Precautions, H&P , NPO status , Patient's Chart, lab work & pertinent test results  Airway       Dental   Pulmonary          Cardiovascular hypertension, + CAD, + Past MI, + Peripheral Vascular Disease and +CHF + dysrhythmias Atrial Fibrillation     Neuro/Psych    GI/Hepatic   Endo/Other  diabetes, Type 2, Insulin Dependent, Oral Hypoglycemic Agents  Renal/GU Renal Insufficiency and CRFRenal disease     Musculoskeletal   Abdominal   Peds  Hematology  (+) anemia ,   Anesthesia Other Findings   Reproductive/Obstetrics                           Anesthesia Physical Anesthesia Plan  ASA: IV  Anesthesia Plan: General   Post-op Pain Management:    Induction: Intravenous  Airway Management Planned: LMA and Oral ETT  Additional Equipment:   Intra-op Plan:   Post-operative Plan: Extubation in OR  Informed Consent: I have reviewed the patients History and Physical, chart, labs and discussed the procedure including the risks, benefits and alternatives for the proposed anesthesia with the patient or authorized representative who has indicated his/her understanding and acceptance.     Plan Discussed with:   Anesthesia Plan Comments:         Anesthesia Quick Evaluation

## 2014-04-13 NOTE — Progress Notes (Signed)
Patient blood glucose on arrival to short stay 55. Patient feels dizzy and clammy upon assessment. Dr. Tamala Julian notified and orders received

## 2014-04-13 NOTE — Transfer of Care (Signed)
Immediate Anesthesia Transfer of Care Note  Patient: [name]  Procedure(s) Performed: Procedure(s) with comments: AMPUTATION BELOW KNEE (Left) - Left Below Knee Amputation  Patient Location: PACU  Anesthesia Type:General  Level of Consciousness: sedated  Airway & Oxygen Therapy: Patient Spontanous Breathing and Patient connected to nasal cannula oxygen  Post-op Assessment: Report given to PACU RN and Post -op Vital signs reviewed and stable  Post vital signs: Reviewed and stable  Complications: No apparent anesthesia complications

## 2014-04-13 NOTE — Op Note (Signed)
04/13/2014  5:45 PM  PATIENT:  [name]    PRE-OPERATIVE DIAGNOSIS:  Gangrene Left Foot  POST-OPERATIVE DIAGNOSIS:  Same  PROCEDURE:  AMPUTATION BELOW KNEE  SURGEON:  Newt Minion, MD  PHYSICIAN ASSISTANT:None ANESTHESIA:   General  PREOPERATIVE INDICATIONS:  [name] is a  68 y.o. female with a diagnosis of Gangrene Left Foot who failed conservative measures and elected for surgical management.    The risks benefits and alternatives were discussed with the patient preoperatively including but not limited to the risks of infection, bleeding, nerve injury, cardiopulmonary complications, the need for revision surgery, among others, and the patient was willing to proceed.  OPERATIVE IMPLANTS: None  OPERATIVE FINDINGS: No abscess muscle viable.  OPERATIVE PROCEDURE: Patient was brought to the operating room and underwent a general anesthetic. After adequate levels of anesthesia were obtained patient's left lower extremity was prepped using DuraPrep and draped into a sterile field. The foot was draped out of the sterile field with an impervious stocking at. A timeout was called. A transverse incision was made 11 cm distal to the tibial tubercle. This curved proximally and a large posterior flap was created. The bone was cut just proximal to the skin incision and beveled anteriorly the fibula was transected just proximal to the tibial incision. A knife was used to create a large posterior flap. The sciatic nerve was pulled cut and allowed to retract. The vascular bundles were suture ligated with 2-0 silk. The tourniquet was deflated hemostasis was obtained. The deep and superficial fascial layers were closed using #1 PDS. The skin was closed using staples. A sterile compressive dressing was applied. Patient was extubated taken to the PACU in stable condition.

## 2014-04-13 NOTE — H&P (Signed)
[name] is an 68 y.o. female.   Chief Complaint: Gangrene left foot HPI: Patient is a 68 year old woman with diabetes peripheral vascular disease who has undergone foot salvage intervention who has had progressive gangrenous changes to the left foot.  Past Medical History  Diagnosis Date  . Diabetes mellitus, type 2   . Essential hypertension, benign   . Chronic systolic heart failure   . Coronary atherosclerosis of native coronary artery     Nonobstructive  . Nonischemic cardiomyopathy     LVEF 20% up to 45%  . PAD (peripheral artery disease)     Left foot gangrene  . Anxiety   . Depression   . MRSA bacteremia January 2015    Associated with probable tricuspid valve vegetation, ICD system extracted February 2050  . Atrial fibrillation   . Myocardial infarction 2011  . CHF (congestive heart failure)   . Anemia     had to have blood transfusion  . Constipation     Past Surgical History  Procedure Laterality Date  . Abdominal hysterectomy    . Left shoulder surgery      Rotator Cuff  . Breast lumpectomy      Bil  . Cardiac defibrillator placement      Medtronic [id number]   . Exploratory laparotomy with lysis of adhesions    . Bladder surgery  2011 BENIGN MASS REMOVED FROM BLADDER  . Eye surgery  BILATERAL CATARACTS REMOVAL  10 YEARS AGO PER PATIENT  . Pr vein bypass graft,aorto-fem-pop  05-2011    Left popliteal- PTA graft   . Viterectomy  11/10/2012    OD   Dr Zigmund Daniel  . 25 gauge pars plana vitrectomy with 20 gauge mvr port for macular hole Right 11/10/2012    Procedure: 67 GAUGE PARS PLANA VITRECTOMY WITH 20 GAUGE MVR PORT FOR MACULAR HOLE;  Surgeon: Hayden Pedro, MD;  Location: [place];  Service: Ophthalmology;  Laterality: Right;  . Gas insertion Right 11/10/2012    Procedure: INSERTION OF GAS;  Surgeon: Hayden Pedro, MD;  Location: [place];  Service: Ophthalmology;  Laterality: Right;  C3F8  . Serum patch Right 11/10/2012    Procedure: SERUM PATCH;  Surgeon:  Hayden Pedro, MD;  Location: [place];  Service: Ophthalmology;  Laterality: Right;  . 25 gauge pars plana vitrectomy with 20 gauge mvr port for macular hole Right 05/09/2013    Procedure: 19 GAUGE PARS PLANA VITRECTOMY WITH 20 GAUGE MVR PORT FOR MACULAR HOLE;  Surgeon: Hayden Pedro, MD;  Location: [place];  Service: Ophthalmology;  Laterality: Right;  . Gas/fluid exchange Right 05/09/2013    Procedure: GAS/FLUID EXCHANGE;  Surgeon: Hayden Pedro, MD;  Location: [place];  Service: Ophthalmology;  Laterality: Right;  C3F8   . Serum patch Right 05/09/2013    Procedure: SERUM PATCH;  Surgeon: Hayden Pedro, MD;  Location: [place];  Service: Ophthalmology;  Laterality: Right;  . Laser photo ablation Right 05/09/2013    Procedure: LASER PHOTO ABLATION;  Surgeon: Hayden Pedro, MD;  Location: [place];  Service: Ophthalmology;  Laterality: Right;  Endolaser   . Membrane peel Right 05/09/2013    Procedure: MEMBRANE PEEL;  Surgeon: Hayden Pedro, MD;  Location: [place];  Service: Ophthalmology;  Laterality: Right;  . Icd lead removal N/A 11/09/2013    Procedure: ICD LEAD REMOVAL;  Surgeon: Evans Lance, MD;  Location: [place];  Service: Cardiovascular;  Laterality: N/A;  . Central venous catheter insertion  Right 11/09/2013    Procedure: INSERTION CENTRAL LINE ADULT;  Surgeon: Evans Lance, MD;  Location: [place];  Service: Cardiovascular;  Laterality: Right;  . Amputation Left 03/23/2014    Procedure: Left Foot 1st Ray Amputation;  Surgeon: Newt Minion, MD;  Location: [place];  Service: Orthopedics;  Laterality: Left;  Left Foot 1st Ray Amputation  . Toe amputation Left     great toe    Family History  Problem Relation Age of Onset  . [name] Father   . [name] Mother     [name]  . [name] [name] Mother   . Hypertension Mother    Social History:  reports that she has never smoked. She has never used smokeless tobacco. She reports that she does not drink alcohol or use illicit drugs.  Allergies:   Allergies  Allergen Reactions  . Hydrocodone-Acetaminophen Other (See Comments)    hallucinations    No prescriptions prior to admission    Results for orders placed during the hospital encounter of 04/13/14 (from the past 48 hour(s))  POCT I-STAT 4, (NA,K, GLUC, HGB,HCT)     Status: Abnormal   Collection Time    04/12/14  3:50 PM      Result Value Ref Range   Sodium 142  137 - 147 mEq/L   Potassium 4.3  3.7 - 5.3 mEq/L   Glucose, Bld 127 (*) 70 - 99 mg/dL   HCT 21.0 (*) 36.0 - 46.0 %   Hemoglobin 7.1 (*) 12.0 - 15.0 g/dL  POCT I-STAT 4, (NA,K, GLUC, HGB,HCT)     Status: Abnormal   Collection Time    04/12/14  4:19 PM      Result Value Ref Range   Sodium 141  137 - 147 mEq/L   Potassium 5.5 (*) 3.7 - 5.3 mEq/L   Glucose, Bld 145 (*) 70 - 99 mg/dL   HCT 28.0 (*) 36.0 - 46.0 %   Hemoglobin 9.5 (*) 12.0 - 15.0 g/dL   No results found.  Review of Systems  All other systems reviewed and are negative.   There were no vitals taken for this visit. Physical Exam  On examination patient has gangrenous ulcer changes with extends back to the hindfoot on the left foot. Assessment/Plan Assessment: Gangrene left foot with peripheral vascular disease and diabetes.  Plan: Will plan for a left transtibial amputation. Risks and benefits were discussed including risk of wound healing. Patient states she understands and wished to proceed at this time.  DUDA,MARCUS V 04/13/2014, 6:42 AM

## 2014-04-14 LAB — GLUCOSE, CAPILLARY
GLUCOSE-CAPILLARY: 36 mg/dL — AB (ref 70–99)
GLUCOSE-CAPILLARY: 223 mg/dL — AB (ref 70–99)
GLUCOSE-CAPILLARY: 71 mg/dL (ref 70–99)
Glucose-Capillary: 150 mg/dL — ABNORMAL HIGH (ref 70–99)
Glucose-Capillary: 152 mg/dL — ABNORMAL HIGH (ref 70–99)
Glucose-Capillary: 37 mg/dL — CL (ref 70–99)
Glucose-Capillary: 54 mg/dL — ABNORMAL LOW (ref 70–99)
Glucose-Capillary: 70 mg/dL (ref 70–99)

## 2014-04-14 MED ORDER — DEXTROSE 50 % IV SOLN
INTRAVENOUS | Status: AC
  Administered 2014-04-14: 07:00:00
  Filled 2014-04-14: qty 50

## 2014-04-14 MED ORDER — DEXTROSE 50 % IV SOLN: 50.0000 mL | Freq: Once | INTRAVENOUS | Status: AC | PRN

## 2014-04-14 NOTE — Progress Notes (Signed)
Pt found by nurse tech to be unresponsive. This RN unable to arouse. VSS. CBG 36. Rapid Response RN called to floor. 23mL D50 pushed. Pt arousable within 10 min of administration, A&O.  Gave pt orange juice. Rechecked CBG, now at 150. MD aware. Nursing will continue to monitor.

## 2014-04-14 NOTE — Progress Notes (Signed)
Patient ID: [name], female   DOB: 07/17/1946, 68 y.o.   MRN: [id number] Postoperative day 1 transtibial amputation. Patient is comfortable this morning. Physical therapy for progressive ambulation and transfer training. Patient wishes to be discharged to home once she is safe. Will need home health physical therapy and durable medical equipment.

## 2014-04-14 NOTE — Progress Notes (Signed)
Hypoglycemic Event  CBG: 54  Treatment: 15 GM carbohydrate snack  Symptoms: None  Follow-up CBG: Time:2205 CBG Result:70  Possible Reasons for Event: Unknown  Comments/MD notified:no    [name]  Remember to initiate Hypoglycemia Order Set & complete

## 2014-04-14 NOTE — Progress Notes (Signed)
Hypoglycemic Event  CBG: 37  Treatment: 15 GM carbohydrate snack  Symptoms: Shaky  Follow-up CBG: Time:2145 CBG Result:54  Possible Reasons for Event: Medication regimen: lantus not on regular schedule  Comments/MD notified:no     [name]  Remember to initiate Hypoglycemia Order Set & complete

## 2014-04-14 NOTE — Progress Notes (Signed)
Called per floor RN for pt in [facility]. Unable to arouse, VSS, po2 100s. Upon my arrival pt found in bed, obtunded, CBG just obtained yielding 36 at 0630. D50 1 amp given. Pt aroused within 10 minutes to sternal rub. Once alert and oriented pt provide two juice cups. Lantus 35 units given last night at bedtime, pt did not have bedtime snack. CBG rechecked 0640 yielding 150. Pt left resting in bed, alert/oriented.

## 2014-04-14 NOTE — Evaluation (Signed)
Physical Therapy Evaluation Patient Details Name: [name] MRN: [id number] DOB: [birth year] Today's Date: 04/14/2014   History of Present Illness  68 y.o. female s/p left transtibial amputation. Hx of diabetes, HTN, CAD, and MI.  Clinical Impression  Patient is seen following the above procedure and presents with functional limitations due to the deficits listed below (see PT Problem List). Able to ambulate up to 6 feet post op day #1 and is highly motivated to improve functional abilities. She reports she will have 24 hour care at home upon d/c. Will continue to work with pt for gait training, stair training, and to improve independence with transfers and other functional mobility. Patient will benefit from skilled PT to increase their independence and safety with mobility to allow discharge to the venue listed below.       Follow Up Recommendations Home health PT;Supervision for mobility/OOB    Equipment Recommendations  3in1 (PT);Wheelchair (measurements PT);Wheelchair cushion (measurements PT);Other (comment) (OT to assess further assistive equipment for bathing)    Recommendations for Other Services OT consult     Precautions / Restrictions Precautions Precautions: Knee Precaution Comments: Reviewed post-op precautions and positioning for optimal healing Restrictions Weight Bearing Restrictions: Yes LLE Weight Bearing: Non weight bearing      Mobility  Bed Mobility Overal bed mobility: Needs Assistance Bed Mobility: Supine to Sit     Supine to sit: Supervision     General bed mobility comments: Supervision for safety with VCs for technique. Pt performs this task well and does not need physical assist with HOB flat and no use of rail. Performs long sit prior to coming to EOB.  Transfers Overall transfer level: Needs assistance Equipment used: Rolling walker (2 wheeled) Transfers: Sit to/from Stand Sit to Stand: Min guard         General transfer comment:  Min guard for safety with VCs for hand placement. No physical assist needed. VCs for upright posture once standing. No dizziness or loss of balance.  Ambulation/Gait Ambulation/Gait assistance: Min assist;+2 safety/equipment Ambulation Distance (Feet): 6 Feet Assistive device: Rolling walker (2 wheeled) Gait Pattern/deviations:  (Hop-to pattern)   Gait velocity interpretation: Below normal speed for age/gender General Gait Details: Min assist intially for walker placement. Educated on safe DME use. Able to safely use UEs to hop forward on RLE. second person available for safety.  Stairs            Wheelchair Mobility    Modified Rankin (Stroke Patients Only)       Balance Overall balance assessment: Needs assistance Sitting-balance support: No upper extremity supported;Feet supported Sitting balance-Leahy Scale: Good     Standing balance support: Single extremity supported Standing balance-Leahy Scale: Poor                               Pertinent Vitals/Pain 7/10 pain Pt requests daily scheduled medication Nurse notified via telephone Patient repositioned in chair for comfort.     Home Living Family/patient expects to be discharged to:: Private residence Living Arrangements: Children Available Help at Discharge: Family;Available 24 hours/day Type of Home: House Home Access: Stairs to enter Entrance Stairs-Rails: Left;Right;Can reach both Entrance Stairs-Number of Steps: 4 Home Layout: One level Home Equipment: Walker - 2 wheels;Cane - quad;Shower seat - built in      Prior Function Level of Independence: Independent with assistive device(s)         Comments: Amb with cane.  Hand Dominance   Dominant Hand: Right    Extremity/Trunk Assessment   Upper Extremity Assessment: Defer to OT evaluation           Lower Extremity Assessment: LLE deficits/detail   LLE Deficits / Details: decreased strength and ROM as expected  post-op.     Communication   Communication: No difficulties  Cognition Arousal/Alertness: Awake/alert Behavior During Therapy: WFL for tasks assessed/performed Overall Cognitive Status: Within Functional Limits for tasks assessed                      General Comments      Exercises Amputee Exercises Quad Sets: AROM;Left;10 reps;Supine Hip Extension: AROM;10 reps;Standing Hip ABduction/ADduction: AROM;Left;5 reps;Supine Knee Extension: AROM;Left;10 reps;Seated      Assessment/Plan    PT Assessment Patient needs continued PT services  PT Diagnosis Difficulty walking;Acute pain   PT Problem List Decreased strength;Decreased range of motion;Decreased activity tolerance;Decreased balance;Decreased mobility;Decreased knowledge of use of DME;Pain  PT Treatment Interventions DME instruction;Gait training;Stair training;Functional mobility training;Therapeutic activities;Therapeutic exercise;Balance training;Neuromuscular re-education;Patient/family education;Modalities;Wheelchair mobility training   PT Goals (Current goals can be found in the Care Plan section) Acute Rehab PT Goals Patient Stated Goal: Go home and walk again PT Goal Formulation: With patient Time For Goal Achievement: 04/21/14 Potential to Achieve Goals: Good    Frequency Min 5X/week   Barriers to discharge        Co-evaluation               End of Session Equipment Utilized During Treatment: Gait belt Activity Tolerance: Patient tolerated treatment well Patient left: in chair;with call bell/phone within reach Nurse Communication: Mobility status;Patient requests pain meds;Other (comment) (Pt requests scheduled daily meds)         Time: [id number] PT Time Calculation (min): 28 min   Charges:   PT Evaluation $Initial PT Evaluation Tier I: 1 Procedure PT Treatments $Therapeutic Exercise: 8-22 mins   PT G Codes:        Elayne Snare, [place]   [name] 04/14/2014, 11:04 AM

## 2014-04-14 NOTE — Progress Notes (Signed)
Hypoglycemic Event  CBG: 36  Treatment: D50 IV 50 mL  Symptoms: Sweaty and unresponsive  Follow-up CBG: [id number] CBG Result: 150  Possible Reasons for Event: Inadequate meal intake and Unknown  Comments/MD notified:Rapid Response RN called    Orlean Patten A  Remember to initiate Hypoglycemia Order Set & complete

## 2014-04-15 LAB — GLUCOSE, CAPILLARY
GLUCOSE-CAPILLARY: 121 mg/dL — AB (ref 70–99)
Glucose-Capillary: 120 mg/dL — ABNORMAL HIGH (ref 70–99)
Glucose-Capillary: 139 mg/dL — ABNORMAL HIGH (ref 70–99)
Glucose-Capillary: 72 mg/dL (ref 70–99)
Glucose-Capillary: 72 mg/dL (ref 70–99)
Glucose-Capillary: 97 mg/dL (ref 70–99)

## 2014-04-15 NOTE — Progress Notes (Signed)
Occupational Therapy Evaluation Patient Details Name: [name] MRN: [id number] DOB: 09-27-1946 Today's Date: 04/15/2014    History of Present Illness [name] is a 68 y.o. Female s/p Lt transtibial amputation. Hx of DM, HTN, CAD, and MI.    Clinical Impression   PTA pt lived at home and was independent with assistive device (cane) for ADLs and functional mobility. Pt has strong desire to be independent and is coping well with her loss. Pt is making good progress with transfers, however demonstrates some impulsivity when she becomes fatigued and fearful of falling. Education and training completed for fall prevention strategies to decrease risk of falls at home. Education and training also provided to pt and daughter for pain control to manage phantom limb pain and desensitization strategies. OT answered questions and explained prosthetic process with pt. Pt would benefit from skilled OT to increase independence prior to d/c home.     Follow Up Recommendations  Home health OT;Supervision/Assistance - 24 hour    Equipment Recommendations  3 in 1 bedside comode;Other (comment) (drop arm BSC)       Precautions / Restrictions Precautions Precautions: Knee Precaution Booklet Issued: Yes (comment) Precaution Comments: Educated pt on positioning for optimal healing, NWB status, and phantom limb pain.  Restrictions Weight Bearing Restrictions: Yes LLE Weight Bearing: Non weight bearing      Mobility Bed Mobility               General bed mobility comments: Pt sitting in recliner before/after OT session.  Transfers Overall transfer level: Needs assistance Equipment used: Rolling walker (2 wheeled) Transfers: Sit to/from Stand Sit to Stand: Min guard         General transfer comment: Min guard for safety with VC's for hand placement. Pt with no loss of balance during transition and required no physical assist to power up. Practiced x3 to increase UE strength  and endurance.          ADL Overall ADL's : Needs assistance/impaired Eating/Feeding: Independent;Sitting   Grooming: Set up;Sitting   Upper Body Bathing: Set up;Sitting   Lower Body Bathing: Min guard;Sit to/from stand   Upper Body Dressing : Set up;Sitting   Lower Body Dressing: Min guard;Sit to/from stand   Toilet Transfer: Min guard;Stand-pivot;RW;BSC   Toileting- Water quality scientist and Hygiene: Min guard;Sit to/from stand   Tub/ Shower Transfer: Walk-in shower;Minimal assistance;Shower seat;Rolling walker     General ADL Comments: Pt is coping well with amputation and speaks about coming to terms with her new need to rely on others. Encouraged pt that therapy will work with her to increase strength and endurance for increased independence with ADLs. Educated pt on fall prevention and strategies for environmental modification to decrease risk of falls. Educated pt on presence of phantom limb pain as well as pain control techniques, such as deep breathing. Educated pt on desensitization techniques, such as rubbing or tapping, over end of residual limb to begin to prepare for pre-prosthetic training.      Vision  Pt reports no change from baseline.  No apparent visual deficits.                  Perception Perception Perception Tested?: No   Praxis Praxis Praxis tested?: Within functional limits    Pertinent Vitals/Pain NAD; pt resting comfortably and reports pain is well controlled at this time.      Hand Dominance Right   Extremity/Trunk Assessment Upper Extremity Assessment Upper Extremity Assessment: Overall WFL for tasks  assessed   Lower Extremity Assessment Lower Extremity Assessment: Defer to PT evaluation   Cervical / Trunk Assessment Cervical / Trunk Assessment: Normal   Communication Communication Communication: No difficulties   Cognition Arousal/Alertness: Awake/alert Behavior During Therapy: WFL for tasks assessed/performed Overall  Cognitive Status: Within Functional Limits for tasks assessed                     General Comments    Educated pt on skin checks once bandaging is removed. Educated pt on use of mirror to check underside of residual limb and to perform desensitization techniques as tolerated. Explained prosthetic process and answered pt's questions regarding LE prosthesis.      Exercises Exercises: Other exercises Other Exercises Other Exercises: pt performed chair push ups x5 with 10+ second hold per push up to increase tricep strength.         Home Living Family/patient expects to be discharged to:: Private residence Living Arrangements: Children Available Help at Discharge: Family;Available 24 hours/day Type of Home: House Home Access: Stairs to enter [employer] of Steps: 4 Entrance Stairs-Rails: Left;Right;Can reach both Home Layout: One level     Bathroom Shower/Tub: Occupational psychologist: Standard     Home Equipment: Environmental consultant - 2 wheels;Cane - quad;Shower seat - built in          Prior Functioning/Environment Level of Independence: Independent with assistive device(s)        Comments: Amb with cane.    OT Diagnosis: Generalized weakness;Acute pain   OT Problem List: Decreased strength;Decreased range of motion;Decreased activity tolerance;Impaired balance (sitting and/or standing);Decreased safety awareness;Decreased knowledge of use of DME or AE;Decreased knowledge of precautions;Pain   OT Treatment/Interventions: Self-care/ADL training;Therapeutic exercise;Energy conservation;DME and/or AE instruction;Therapeutic activities;Patient/family education;Balance training    OT Goals(Current goals can be found in the care plan section) Acute Rehab OT Goals Patient Stated Goal: to be independent when I go home OT Goal Formulation: With patient Time For Goal Achievement: 04/22/14 Potential to Achieve Goals: Good ADL Goals Pt Will Perform Grooming: with  modified independence;standing Pt Will Transfer to Toilet: with modified independence;ambulating;bedside commode Pt Will Perform Toileting - Clothing Manipulation and hygiene: with modified independence;sit to/from stand Pt Will Perform Tub/Shower Transfer: Shower transfer;with modified independence;ambulating;shower seat;rolling walker Pt/caregiver will Perform Home Exercise Program: Increased strength;Both right and left upper extremity;With theraband;Independently;With written HEP provided  OT Frequency: Min 2X/week              End of Session Equipment Utilized During Treatment: Gait belt;Rolling walker  Activity Tolerance: Patient tolerated treatment well Patient left: in chair;with call bell/phone within reach   Time: 1423-1502 OT Time Calculation (min): 39 min Charges:  OT General Charges $OT Visit: 1 Procedure OT Evaluation $Initial OT Evaluation Tier I: 1 Procedure OT Treatments $Self Care/Home Management : 8-22 mins $Therapeutic Activity: 8-22 mins  Juluis Rainier [id number] 04/15/2014, 3:42 PM

## 2014-04-15 NOTE — Progress Notes (Signed)
Physical Therapy Treatment Patient Details Name: [name] MRN: [id number] DOB: Dec 14, 1945 Today's Date: 04/15/2014    History of Present Illness 67 y.o. female s/p left transtibial amputation. Hx of diabetes, HTN, CAD, and MI.    PT Comments    Patient progressing towards physical therapy goals. Able to ambulate up to 20 feet with min guard for safety while using rolling walker. Fatigues quickly. Attempted stair training however this proved to be too difficult for patient and she has agreed it will be safer to enter/exit house with w/c and assistance from family; a handout was provided and I reviewed this with the patient and her daugther. Patient will continue to benefit from skilled physical therapy services to further improve independence with functional mobility.   Follow Up Recommendations  Home health PT;Supervision for mobility/OOB     Equipment Recommendations  3in1 (PT);Wheelchair (measurements PT);Wheelchair cushion (measurements PT);Other (comment) (OT to assess further assistive equipment for bathing)    Recommendations for Other Services OT consult     Precautions / Restrictions Precautions Precautions: Knee Precaution Booklet Issued: Yes (comment) Precaution Comments: Reviewed post-op precautions and positioning for optimal healing Restrictions Weight Bearing Restrictions: Yes LLE Weight Bearing: Non weight bearing    Mobility  Bed Mobility                  Transfers Overall transfer level: Needs assistance Equipment used: Rolling walker (2 wheeled) Transfers: Sit to/from Stand Sit to Stand: Min assist         General transfer comment: Min guard for sit to stand, however required assistance to sit on one occasion due to fatigue and rushing which resulted in an uncontrolled sit into recliner. Educated on technique to appropriately perform sitting from a standing position. Practiced several times from  recliner.  Ambulation/Gait Ambulation/Gait assistance: Min guard;+2 safety/equipment Ambulation Distance (Feet): 20 Feet Assistive device: Rolling walker (2 wheeled) Gait Pattern/deviations:  ("Hop-to" pattern)   Gait velocity interpretation: Below normal speed for age/gender General Gait Details: Min guard with ambulation for safety, second person followed with equipment. VC to increase UE use and to maintain forward gaze.   Stairs Stairs: Yes Stairs assistance: +2 physical assistance Stair Management: No rails;Step to pattern;Backwards;With walker Number of Stairs: 1 General stair comments: Only able step up one step with backwards technique, +2 for physical assist and to block rolling walker. This task took considerable effort for pt and required min-mod physical assist for pt to maintain balance due to poor UE strength. Decided pt will perform W/c transfer up steps into house with assist from family. Handout provided.  Wheelchair Mobility    Modified Rankin (Stroke Patients Only)       Balance                                    Cognition Arousal/Alertness: Awake/alert Behavior During Therapy: WFL for tasks assessed/performed Overall Cognitive Status: Within Functional Limits for tasks assessed                      Exercises      General Comments        Pertinent Vitals/Pain Pt with no complaints of pain during therapy session Patient repositioned in chair for comfort.     Home Living                      Prior Function  PT Goals (current goals can now be found in the care plan section) Acute Rehab PT Goals PT Goal Formulation: With patient Time For Goal Achievement: 04/21/14 Potential to Achieve Goals: Good Progress towards PT goals: Progressing toward goals    Frequency  Min 5X/week    PT Plan Current plan remains appropriate    Co-evaluation             End of Session   Activity Tolerance:  Patient tolerated treatment well Patient left: in chair;with call bell/phone within reach;with family/visitor present     Time: [id number] PT Time Calculation (min): 26 min  Charges:  $Gait Training: 8-22 mins $Therapeutic Activity: 8-22 mins                    G Codes:      [employer], [place]  [name] 04/15/2014, 1:41 PM

## 2014-04-15 NOTE — Progress Notes (Signed)
Patient ID: [name], female   DOB: October 04, 1946, 68 y.o.   MRN: [id number] Patient progressing well with therapy. Plan for stair training today. Anticipate discharge to home on Monday.

## 2014-04-16 ENCOUNTER — Encounter (HOSPITAL_COMMUNITY): Payer: Self-pay | Admitting: Orthopedic Surgery

## 2014-04-16 LAB — GLUCOSE, CAPILLARY
GLUCOSE-CAPILLARY: 146 mg/dL — AB (ref 70–99)
Glucose-Capillary: 114 mg/dL — ABNORMAL HIGH (ref 70–99)

## 2014-04-16 MED ORDER — ALPRAZOLAM 0.25 MG PO TABS: 0.2500 mg | ORAL_TABLET | Freq: Two times a day (BID) | ORAL | Status: DC | PRN

## 2014-04-16 MED ORDER — OXYCODONE-ACETAMINOPHEN 5-325 MG PO TABS: 1.0000 | ORAL_TABLET | ORAL | Status: DC | PRN

## 2014-04-16 MED ORDER — METHOCARBAMOL 500 MG PO TABS: 500.0000 mg | ORAL_TABLET | Freq: Three times a day (TID) | ORAL | Status: DC

## 2014-04-16 NOTE — Clinical Documentation Improvement (Signed)
To Orthopedic MD's, NP's,  and PA's  Patient has h/o of " Chronic Systolic CHF and Primary Cardiomyopathy" she continued on her home cardiac meds and lasix while in hospital, if appropriate please document these diagnosis in notes and discharge summary. Thank you   Medicare rules require specification as to whether an inpatient diagnosis was present at the time of admission.  Please clarify if the following diagnosis  Chronic systolic CHF/ Primary Cardiomyopathy was    Present at the time of admission   NOT present at the time of inpatient admission and it developed during the inpatient stay   Unable to clinically determine whether the condition was present on admission.   Documentation insufficient to determine if condition was present at the time of inpatient admission  Thank You, Ree Kida ,RN Clinical Documentation Specialist:  [facility]

## 2014-04-16 NOTE — Discharge Summary (Signed)
Physician Discharge Summary  Patient ID: [name] MRN: [id number] DOB/AGE: [birth year] 68 y.o.  Admit date: 04/13/2014 Discharge date: 04/16/2014  Admission Diagnoses: Gangrene left foot  Discharge Diagnoses: Same Active Problems:   Below knee amputation status   Discharged Condition: stable  Hospital Course: Patient's hospital course was essentially unremarkable. She underwent a left transtibial amputation. Patient progressed well with therapy and was discharged to home in stable condition.  Consults: None  Significant Diagnostic Studies: labs: Routine labs  Treatments: surgery: See operative note  Discharge Exam: Blood pressure 145/48, pulse 62, temperature 97.6 F (36.4 C), temperature source Oral, resp. rate 16, height 5\' 1"  (1.549 m), weight 71.215 kg (157 lb), SpO2 97.00%. Incision/Wound: dressing clean dry and intact  Disposition: 01-Home or Self Care  Discharge Instructions   Call MD / Call 911    Complete by:  As directed   If you experience chest pain or shortness of breath, CALL 911 and be transported to the hospital emergency room.  If you develope a fever above 101 F, pus (white drainage) or increased drainage or redness at the wound, or calf pain, call your surgeon's office.     Constipation Prevention    Complete by:  As directed   Drink plenty of fluids.  Prune juice may be helpful.  You may use a stool softener, such as Colace (over the counter) 100 mg twice a day.  Use MiraLax (over the counter) for constipation as needed.     Diet - low sodium heart healthy    Complete by:  As directed      Increase activity slowly as tolerated    Complete by:  As directed             Medication List         ALPRAZolam 0.25 MG tablet  Commonly known as:  XANAX  Take 1 tablet (0.25 mg total) by mouth 2 (two) times daily as needed for anxiety.     amLODipine 10 MG tablet  Commonly known as:  NORVASC  Take 1 tablet (10 mg total) by mouth daily.     aspirin 81 MG tablet  Take 81 mg by mouth daily.     carvedilol 25 MG tablet  Commonly known as:  COREG  Take 1 tablet (25 mg total) by mouth 2 (two) times daily with a meal.     docusate sodium 100 MG capsule  Commonly known as:  COLACE  Take 100 mg by mouth daily as needed. For constipation     furosemide 80 MG tablet  Commonly known as:  LASIX  Take 40-80 mg by mouth 2 (two) times daily. Takes 80 mg in the morning and 40 mg in the morning     hydrALAZINE 100 MG tablet  Commonly known as:  APRESOLINE  Take 100 mg by mouth 3 (three) times daily.     insulin glargine 100 UNIT/ML injection  Commonly known as:  LANTUS  Inject 35 Units into the skin daily.     insulin lispro 100 UNIT/ML injection  Commonly known as:  HUMALOG  Inject 0-8 Units into the skin every evening. Sliding scale: 200-249=2 units, 250-299=4 units, 300-349=6 units, >350=8 units     methocarbamol 500 MG tablet  Commonly known as:  ROBAXIN  Take 1 tablet (500 mg total) by mouth 3 (three) times daily.     multivitamin with minerals tablet  Take 1 tablet by mouth daily.     nitroGLYCERIN 0.4 MG SL tablet  Commonly known as:  NITROSTAT  Place 0.4 mg under the tongue every 5 (five) minutes as needed. chest pain     oxyCODONE-acetaminophen 5-325 MG per tablet  Commonly known as:  ROXICET  Take 1 tablet by mouth every 4 (four) hours as needed for severe pain.     oxyCODONE-acetaminophen 5-325 MG per tablet  Commonly known as:  ROXICET  Take 1 tablet by mouth every 4 (four) hours as needed for severe pain.     polyethylene glycol packet  Commonly known as:  MIRALAX / GLYCOLAX  Take 17 g by mouth daily as needed.     sodium bicarbonate 650 MG tablet  Take 650 mg by mouth 3 (three) times daily.     vitamin C 500 MG tablet  Commonly known as:  ASCORBIC ACID  Take 500 mg by mouth daily.     Vitamin D-3 1000 UNITS Caps  Take 1,000 Units by mouth daily.           Follow-up Information   Follow up  with DUDA,MARCUS V, MD In 3 days.   Specialty:  Orthopedic Surgery   Contact information:   [place] [address] [phone]       Signed: Newt Minion 04/16/2014, 6:43 AM

## 2014-04-16 NOTE — Progress Notes (Signed)
Physical Therapy Treatment Patient Details Name: [name] MRN: [id number] DOB: 06-03-1946 Today's Date: 04/16/2014    History of Present Illness 68 y.o. female s/p left transtibial amputation. Hx of diabetes, HTN, CAD, and MI.    PT Comments    Patient is progressing well towards physical therapy goals, increasing ambulatory distance and improving independence and safety with transfers. Pt is currently at a min guard level and higher for all mobility. She will have 24 hour care at home. All education and therapeutic exercises have been reviewed and feel that patient is adequate for d/c from PT standpoint. Patient will continue to benefit from skilled physical therapy services at home with HHPT to further improve independence with functional mobility.    Follow Up Recommendations  Home health PT;Supervision for mobility/OOB     Equipment Recommendations  3in1 (PT);Wheelchair (measurements PT);Wheelchair cushion (measurements PT);Other (comment) (OT to assess further assistive equipment for bathing)    Recommendations for Other Services OT consult     Precautions / Restrictions Precautions Precautions: Knee Restrictions Weight Bearing Restrictions: Yes LLE Weight Bearing: Non weight bearing    Mobility  Bed Mobility Overal bed mobility: Modified Independent Bed Mobility: Supine to Sit           General bed mobility comments: No physical assist or cues needed. performs safely.  Transfers Overall transfer level: Needs assistance Equipment used: Rolling walker (2 wheeled) Transfers: Sit to/from [employer] Sit to Stand: Min guard   Squat pivot transfers: Min guard     General transfer comment: Min guard for safety, VC for hand placement. Performed from [facility], lowest bed setting x 2 and recliner x2. Pt also educated on squat pivot transfer from bed to chair, performs at min guard level with cues for technique.  Ambulation/Gait Ambulation/Gait  assistance: Min guard Ambulation Distance (Feet): 60 Feet ([id number]) Assistive device: Rolling walker (2 wheeled) Gait Pattern/deviations:  ("hop-to")   Gait velocity interpretation: Below normal speed for age/gender General Gait Details: Min guard for safety with education to use UEs for decreased hop, allowing smooth step with RLE. No loss of balance noted. Demonstrating good RW control. Fatigues easily, requiring one standing rest break during 30 foot bout.   Stairs            Wheelchair Mobility    Modified Rankin (Stroke Patients Only)       Balance                                    Cognition Arousal/Alertness: Awake/alert Behavior During Therapy: WFL for tasks assessed/performed Overall Cognitive Status: Within Functional Limits for tasks assessed                      Exercises Amputee Exercises Towel Squeeze: Strengthening;Both;Seated;5 reps (Seated with adduction using pillow) Hip Extension: AROM;Left;10 reps;Standing Knee Flexion: AROM;Left;10 reps;Seated Knee Extension: AROM;Left;10 reps;Seated Chair Push Up: Strengthening;Both;10 reps;Seated    General Comments        Pertinent Vitals/Pain Pt with no complaints of pain at this time Patient repositioned in chair for comfort.     Home Living                      Prior Function            PT Goals (current goals can now be found in the care plan section) Acute Rehab PT Goals PT  Goal Formulation: With patient Time For Goal Achievement: 04/21/14 Potential to Achieve Goals: Good Progress towards PT goals: Progressing toward goals    Frequency  Min 5X/week    PT Plan Current plan remains appropriate    Co-evaluation             End of Session   Activity Tolerance: Patient tolerated treatment well Patient left: in chair;with call bell/phone within reach     Time: [id number] PT Time Calculation (min): 28 min  Charges:  $Gait Training: 8-22  mins $Therapeutic Exercise: 8-22 mins                    G Codes:      [employer], [place]  [name] 04/16/2014, 9:59 AM

## 2014-04-16 NOTE — Progress Notes (Signed)
CARE MANAGEMENT NOTE 04/16/2014  Patient:  [name], [name]   Account Number:  [id number]  Date Initiated:  04/16/2014  Documentation initiated by:  [facility]  Subjective/Objective Assessment:   admitted for left BKA     Action/Plan:   PT/OT evals-recommended HHPT and HHOT   Anticipated DC Date:  04/16/2014   Anticipated DC Plan:  [place]  CM consult      Choice offered to / List presented to:  C-1 Patient   DME arranged  3-N-1  [place]      DME agency  [place] arranged  [place].   Status of service:  Completed, signed off Medicare Important Message given?  YES (If response is "NO", the following Medicare IM given date fields will be blank) Date Medicare IM given:  04/16/2014 Medicare IM given by:  [facility] Date Additional Medicare IM given:   Additional Medicare IM given by:    Discharge Disposition:  [place]  Per UR Regulation:    If discussed at Long Length of Stay Meetings, dates discussed:    Comments:  04/12/14 Spoke with patient about HHC, she chose [facility]. Contacted Euclide Granito with [facility] and set up [place] [facility] [place]. Contacted Jermaine at [facility] and requested wheelchair with cushion and 3N1. Equipment was delivered to patient prior to discharge. Fuller Plan RN, BSN, CCM

## 2014-04-16 NOTE — Anesthesia Postprocedure Evaluation (Signed)
  Anesthesia Post-op Note  Patient: [name]  Procedure(s) Performed: Procedure(s) with comments: AMPUTATION BELOW KNEE (Left) - Left Below Knee Amputation  Patient Location: PACU  Anesthesia Type:General  Level of Consciousness: awake, oriented, sedated and patient cooperative  Airway and Oxygen Therapy: Patient Spontanous Breathing  Post-op Pain: mild  Post-op Assessment: Post-op Vital signs reviewed, Patient's Cardiovascular Status Stable, Respiratory Function Stable, Patent Airway and No signs of Nausea or vomiting  Post-op Vital Signs: stable  Last Vitals:  Filed Vitals:   04/16/14 0517  BP: 145/48  Pulse: 62  Temp: 36.4 C  Resp: 16    Complications: No apparent anesthesia complications

## 2014-05-12 ENCOUNTER — Other Ambulatory Visit: Payer: Self-pay | Admitting: Cardiology

## 2014-05-16 ENCOUNTER — Other Ambulatory Visit: Payer: Self-pay | Admitting: Cardiology

## 2014-05-17 LAB — BASIC METABOLIC PANEL
BUN: 51 mg/dL — AB (ref 6–23)
CHLORIDE: 103 meq/L (ref 96–112)
CO2: 23 meq/L (ref 19–32)
CREATININE: 2.12 mg/dL — AB (ref 0.50–1.10)
Calcium: 8.2 mg/dL — ABNORMAL LOW (ref 8.4–10.5)
GLUCOSE: 138 mg/dL — AB (ref 70–99)
Potassium: 3.9 mEq/L (ref 3.5–5.3)
Sodium: 138 mEq/L (ref 135–145)

## 2014-05-18 ENCOUNTER — Telehealth: Payer: Self-pay | Admitting: *Deleted

## 2014-05-18 NOTE — Telephone Encounter (Signed)
[facility] IS CALLING   Pt is scheduled to come in for follow up 06/01/14  But patient states she has been feeling dizzy for over a week when she moves around and sits up. ( she just had her leg amputated and it does seem infected. She has appt today with orthopedic doc)  Today 160/48 HR 78 O2 97  Pt states that the BP when she has been taking it has been BP 110/40.

## 2014-05-18 NOTE — Telephone Encounter (Signed)
FYI to [place] concerned amputation site may be infected,will follow up today with surgeon.

## 2014-05-25 ENCOUNTER — Encounter (HOSPITAL_COMMUNITY): Payer: Self-pay | Admitting: Pharmacy Technician

## 2014-05-25 ENCOUNTER — Other Ambulatory Visit (HOSPITAL_COMMUNITY): Payer: Self-pay | Admitting: Orthopedic Surgery

## 2014-05-28 ENCOUNTER — Telehealth: Payer: Self-pay | Admitting: Cardiology

## 2014-05-28 NOTE — Telephone Encounter (Signed)
Error / tgs  °

## 2014-05-29 ENCOUNTER — Encounter (HOSPITAL_COMMUNITY): Payer: Self-pay | Admitting: *Deleted

## 2014-05-29 MED ORDER — CEFAZOLIN SODIUM-DEXTROSE 2-3 GM-% IV SOLR
2.0000 g | INTRAVENOUS | Status: AC
  Administered 2014-05-30: 2 g via INTRAVENOUS
  Filled 2014-05-29: qty 50

## 2014-05-30 ENCOUNTER — Encounter (HOSPITAL_COMMUNITY)
Admission: RE | Disposition: A | Discharge status: Home / Self Care | Payer: Self-pay | Source: Ambulatory Visit | Attending: Orthopedic Surgery

## 2014-05-30 ENCOUNTER — Encounter (HOSPITAL_COMMUNITY): Payer: Self-pay | Admitting: Anesthesiology

## 2014-05-30 ENCOUNTER — Encounter (HOSPITAL_COMMUNITY): Payer: Medicare Other | Admitting: Certified Registered Nurse Anesthetist

## 2014-05-30 ENCOUNTER — Ambulatory Visit (HOSPITAL_COMMUNITY): Payer: Medicare Other | Admitting: Certified Registered Nurse Anesthetist

## 2014-05-30 ENCOUNTER — Ambulatory Visit (HOSPITAL_COMMUNITY)
Admission: RE | Admit: 2014-05-30 | Discharge: 2014-05-30 | Disposition: A | Discharge status: Home / Self Care | Payer: Medicare Other | Source: Ambulatory Visit | Attending: Orthopedic Surgery | Admitting: Orthopedic Surgery

## 2014-05-30 ENCOUNTER — Ambulatory Visit: Payer: Medicare Other | Admitting: Cardiology

## 2014-05-30 DIAGNOSIS — L03119 Cellulitis of unspecified part of limb: Secondary | ICD-10-CM | POA: Diagnosis not present

## 2014-05-30 DIAGNOSIS — I509 Heart failure, unspecified: Secondary | ICD-10-CM | POA: Insufficient documentation

## 2014-05-30 DIAGNOSIS — F3289 Other specified depressive episodes: Secondary | ICD-10-CM | POA: Diagnosis not present

## 2014-05-30 DIAGNOSIS — Z79899 Other long term (current) drug therapy: Secondary | ICD-10-CM | POA: Diagnosis not present

## 2014-05-30 DIAGNOSIS — Y835 Amputation of limb(s) as the cause of abnormal reaction of the patient, or of later complication, without mention of misadventure at the time of the procedure: Secondary | ICD-10-CM | POA: Insufficient documentation

## 2014-05-30 DIAGNOSIS — T8740 Infection of amputation stump, unspecified extremity: Secondary | ICD-10-CM | POA: Insufficient documentation

## 2014-05-30 DIAGNOSIS — Z794 Long term (current) use of insulin: Secondary | ICD-10-CM | POA: Diagnosis not present

## 2014-05-30 DIAGNOSIS — F411 Generalized anxiety disorder: Secondary | ICD-10-CM | POA: Insufficient documentation

## 2014-05-30 DIAGNOSIS — I252 Old myocardial infarction: Secondary | ICD-10-CM | POA: Diagnosis not present

## 2014-05-30 DIAGNOSIS — I2510 Atherosclerotic heart disease of native coronary artery without angina pectoris: Secondary | ICD-10-CM | POA: Diagnosis not present

## 2014-05-30 DIAGNOSIS — F329 Major depressive disorder, single episode, unspecified: Secondary | ICD-10-CM | POA: Diagnosis not present

## 2014-05-30 DIAGNOSIS — Z95810 Presence of automatic (implantable) cardiac defibrillator: Secondary | ICD-10-CM | POA: Insufficient documentation

## 2014-05-30 DIAGNOSIS — L02419 Cutaneous abscess of limb, unspecified: Secondary | ICD-10-CM | POA: Diagnosis not present

## 2014-05-30 DIAGNOSIS — T8781 Dehiscence of amputation stump: Secondary | ICD-10-CM

## 2014-05-30 DIAGNOSIS — E1159 Type 2 diabetes mellitus with other circulatory complications: Secondary | ICD-10-CM | POA: Insufficient documentation

## 2014-05-30 DIAGNOSIS — I10 Essential (primary) hypertension: Secondary | ICD-10-CM | POA: Diagnosis not present

## 2014-05-30 DIAGNOSIS — Z90710 Acquired absence of both cervix and uterus: Secondary | ICD-10-CM | POA: Insufficient documentation

## 2014-05-30 DIAGNOSIS — I798 Other disorders of arteries, arterioles and capillaries in diseases classified elsewhere: Secondary | ICD-10-CM | POA: Insufficient documentation

## 2014-05-30 DIAGNOSIS — I5022 Chronic systolic (congestive) heart failure: Secondary | ICD-10-CM | POA: Diagnosis not present

## 2014-05-30 HISTORY — PX: STUMP REVISION: SHX6102

## 2014-05-30 LAB — COMPREHENSIVE METABOLIC PANEL
ALT: 12 U/L (ref 0–35)
AST: 24 U/L (ref 0–37)
Albumin: 3.2 g/dL — ABNORMAL LOW (ref 3.5–5.2)
Alkaline Phosphatase: 111 U/L (ref 39–117)
Anion gap: 18 — ABNORMAL HIGH (ref 5–15)
BUN: 71 mg/dL — ABNORMAL HIGH (ref 6–23)
CO2: 25 meq/L (ref 19–32)
Calcium: 9.4 mg/dL (ref 8.4–10.5)
Chloride: 99 mEq/L (ref 96–112)
Creatinine, Ser: 2.56 mg/dL — ABNORMAL HIGH (ref 0.50–1.10)
GFR calc Af Amer: 21 mL/min — ABNORMAL LOW (ref >90)
GFR calc non Af Amer: 18 mL/min — ABNORMAL LOW (ref >90)
Glucose, Bld: 76 mg/dL (ref 70–99)
Potassium: 4.2 mEq/L (ref 3.7–5.3)
SODIUM: 142 meq/L (ref 137–147)
TOTAL PROTEIN: 9.3 g/dL — AB (ref 6.0–8.3)
Total Bilirubin: 0.2 mg/dL — ABNORMAL LOW (ref 0.3–1.2)

## 2014-05-30 LAB — CBC
HCT: 28 % — ABNORMAL LOW (ref 36.0–46.0)
HEMOGLOBIN: 9.2 g/dL — AB (ref 12.0–15.0)
MCH: 29.4 pg (ref 26.0–34.0)
MCHC: 32.9 g/dL (ref 30.0–36.0)
MCV: 89.5 fL (ref 78.0–100.0)
PLATELETS: 398 10*3/uL (ref 150–400)
RBC: 3.13 MIL/uL — AB (ref 3.87–5.11)
RDW: 16.8 % — ABNORMAL HIGH (ref 11.5–15.5)
WBC: 9.5 10*3/uL (ref 4.0–10.5)

## 2014-05-30 LAB — GLUCOSE, CAPILLARY
GLUCOSE-CAPILLARY: 83 mg/dL (ref 70–99)
Glucose-Capillary: 83 mg/dL (ref 70–99)

## 2014-05-30 LAB — APTT: aPTT: 37 seconds (ref 24–37)

## 2014-05-30 LAB — PROTIME-INR
INR: 1.33 (ref 0.00–1.49)
Prothrombin Time: 16.5 seconds — ABNORMAL HIGH (ref 11.6–15.2)

## 2014-05-30 SURGERY — REVISION, AMPUTATION SITE
Anesthesia: General | Site: Knee | Laterality: Left

## 2014-05-30 MED ORDER — ALPRAZOLAM 0.5 MG PO TABS: 0.5000 mg | ORAL_TABLET | Freq: Three times a day (TID) | ORAL | Status: DC | PRN

## 2014-05-30 MED ORDER — FENTANYL CITRATE 0.05 MG/ML IJ SOLN
25.0000 ug | INTRAMUSCULAR | Status: AC | PRN
  Administered 2014-05-30 (×6): 25 ug via INTRAVENOUS

## 2014-05-30 MED ORDER — MIDAZOLAM HCL 2 MG/2ML IJ SOLN
INTRAMUSCULAR | Status: AC
  Filled 2014-05-30: qty 2

## 2014-05-30 MED ORDER — FENTANYL CITRATE 0.05 MG/ML IJ SOLN
50.0000 ug | INTRAMUSCULAR | Status: DC | PRN
  Administered 2014-05-30: 50 ug via INTRAVENOUS

## 2014-05-30 MED ORDER — FENTANYL CITRATE 0.05 MG/ML IJ SOLN
INTRAMUSCULAR | Status: AC
  Filled 2014-05-30: qty 2

## 2014-05-30 MED ORDER — PROPOFOL 10 MG/ML IV BOLUS
INTRAVENOUS | Status: AC
  Filled 2014-05-30: qty 20

## 2014-05-30 MED ORDER — EPHEDRINE SULFATE 50 MG/ML IJ SOLN
INTRAMUSCULAR | Status: DC | PRN
  Administered 2014-05-30 (×2): 5 mg via INTRAVENOUS

## 2014-05-30 MED ORDER — 0.9 % SODIUM CHLORIDE (POUR BTL) OPTIME
TOPICAL | Status: DC | PRN
  Administered 2014-05-30: 1000 mL

## 2014-05-30 MED ORDER — LIDOCAINE HCL (CARDIAC) 20 MG/ML IV SOLN
INTRAVENOUS | Status: AC
  Filled 2014-05-30: qty 5

## 2014-05-30 MED ORDER — LACTATED RINGERS IV SOLN
INTRAVENOUS | Status: DC | PRN
  Administered 2014-05-30: 10:00:00 via INTRAVENOUS

## 2014-05-30 MED ORDER — FENTANYL CITRATE 0.05 MG/ML IJ SOLN
INTRAMUSCULAR | Status: DC | PRN
  Administered 2014-05-30: 25 ug via INTRAVENOUS

## 2014-05-30 MED ORDER — ONDANSETRON HCL 4 MG/2ML IJ SOLN
INTRAMUSCULAR | Status: DC | PRN
  Administered 2014-05-30: 4 mg via INTRAVENOUS

## 2014-05-30 MED ORDER — OXYCODONE-ACETAMINOPHEN 5-325 MG PO TABS: 1.0000 | ORAL_TABLET | ORAL | Status: DC | PRN

## 2014-05-30 MED ORDER — LACTATED RINGERS IV SOLN
INTRAVENOUS | Status: DC
  Administered 2014-05-30: 10:00:00 via INTRAVENOUS

## 2014-05-30 MED ORDER — VANCOMYCIN HCL 500 MG IV SOLR
INTRAVENOUS | Status: AC
  Filled 2014-05-30: qty 500

## 2014-05-30 MED ORDER — LIDOCAINE HCL (CARDIAC) 20 MG/ML IV SOLN
INTRAVENOUS | Status: DC | PRN
  Administered 2014-05-30: 100 mg via INTRAVENOUS

## 2014-05-30 MED ORDER — FENTANYL CITRATE 0.05 MG/ML IJ SOLN
INTRAMUSCULAR | Status: AC
  Filled 2014-05-30: qty 5

## 2014-05-30 MED ORDER — PROPOFOL 10 MG/ML IV BOLUS
INTRAVENOUS | Status: DC | PRN
  Administered 2014-05-30: 200 mg via INTRAVENOUS

## 2014-05-30 MED ORDER — PROMETHAZINE HCL 25 MG/ML IJ SOLN: 6.2500 mg | INTRAMUSCULAR | Status: DC | PRN

## 2014-05-30 MED ORDER — GENTAMICIN SULFATE 40 MG/ML IJ SOLN
INTRAMUSCULAR | Status: DC | PRN
  Administered 2014-05-30 (×2): 80 mg

## 2014-05-30 MED ORDER — VANCOMYCIN HCL 500 MG IV SOLR
INTRAVENOUS | Status: DC | PRN
  Administered 2014-05-30: 500 mg

## 2014-05-30 MED ORDER — GENTAMICIN SULFATE 40 MG/ML IJ SOLN
INTRAMUSCULAR | Status: AC
  Filled 2014-05-30: qty 4

## 2014-05-30 MED ORDER — ONDANSETRON HCL 4 MG/2ML IJ SOLN
INTRAMUSCULAR | Status: AC
  Filled 2014-05-30: qty 2

## 2014-05-30 SURGICAL SUPPLY — 51 items
BANDAGE ESMARK 6X9 LF (GAUZE/BANDAGES/DRESSINGS) ×1 IMPLANT
BLADE SAW RECIP 87.9 MT (BLADE) IMPLANT
BNDG CMPR 9X6 STRL LF SNTH (GAUZE/BANDAGES/DRESSINGS) ×1
BNDG COHESIVE 6X5 TAN STRL LF (GAUZE/BANDAGES/DRESSINGS) ×4 IMPLANT
BNDG ESMARK 6X9 LF (GAUZE/BANDAGES/DRESSINGS) ×3
BNDG GAUZE STRTCH 6 (GAUZE/BANDAGES/DRESSINGS) IMPLANT
COVER SURGICAL LIGHT HANDLE (MISCELLANEOUS) ×3 IMPLANT
CUFF TOURNIQUET SINGLE 34IN LL (TOURNIQUET CUFF) IMPLANT
DRAIN PENROSE 1/2X12 LTX STRL (WOUND CARE) IMPLANT
DRAPE EXTREMITY T 121X128X90 (DRAPE) ×3 IMPLANT
DRAPE PROXIMA HALF (DRAPES) ×6 IMPLANT
DRAPE U-SHAPE 47X51 STRL (DRAPES) ×6 IMPLANT
DRSG ADAPTIC 3X8 NADH LF (GAUZE/BANDAGES/DRESSINGS) ×3 IMPLANT
DRSG PAD ABDOMINAL 8X10 ST (GAUZE/BANDAGES/DRESSINGS) ×2 IMPLANT
DURAPREP 26ML APPLICATOR (WOUND CARE) ×3 IMPLANT
ELECT CAUTERY BLADE 6.4 (BLADE) IMPLANT
ELECT REM PT RETURN 9FT ADLT (ELECTROSURGICAL) ×3
ELECTRODE REM PT RTRN 9FT ADLT (ELECTROSURGICAL) ×1 IMPLANT
EVACUATOR 1/8 PVC DRAIN (DRAIN) IMPLANT
GAUZE SPONGE 4X4 12PLY STRL (GAUZE/BANDAGES/DRESSINGS) ×3 IMPLANT
GLOVE BIOGEL PI IND STRL 9 (GLOVE) ×1 IMPLANT
GLOVE BIOGEL PI INDICATOR 9 (GLOVE) ×2
GLOVE SURG ORTHO 9.0 STRL STRW (GLOVE) ×3 IMPLANT
GOWN STRL REUS W/ TWL XL LVL3 (GOWN DISPOSABLE) ×2 IMPLANT
GOWN STRL REUS W/TWL XL LVL3 (GOWN DISPOSABLE) ×6
KIT BASIN OR (CUSTOM PROCEDURE TRAY) ×3 IMPLANT
KIT ROOM TURNOVER OR (KITS) ×3 IMPLANT
KIT STIMULAN RAPID CURE 5CC (Orthopedic Implant) ×3 IMPLANT
MANIFOLD NEPTUNE II (INSTRUMENTS) ×3 IMPLANT
NS IRRIG 1000ML POUR BTL (IV SOLUTION) ×3 IMPLANT
PACK GENERAL/GYN (CUSTOM PROCEDURE TRAY) ×3 IMPLANT
PAD ARMBOARD 7.5X6 YLW CONV (MISCELLANEOUS) ×6 IMPLANT
PAD CAST 4YDX4 CTTN HI CHSV (CAST SUPPLIES) ×1 IMPLANT
PADDING CAST COTTON 4X4 STRL (CAST SUPPLIES) ×3
PADDING CAST COTTON 6X4 STRL (CAST SUPPLIES) ×3 IMPLANT
SPONGE GAUZE 4X4 12PLY STER LF (GAUZE/BANDAGES/DRESSINGS) ×3 IMPLANT
SPONGE LAP 18X18 X RAY DECT (DISPOSABLE) IMPLANT
STAPLER VISISTAT 35W (STAPLE) IMPLANT
STOCKINETTE IMPERVIOUS LG (DRAPES) IMPLANT
SUT ETHILON 2 0 PSLX (SUTURE) ×4 IMPLANT
SUT PDS AB 1 CT  36 (SUTURE)
SUT PDS AB 1 CT 36 (SUTURE) IMPLANT
SUT SILK 2 0 (SUTURE) ×3
SUT SILK 2-0 18XBRD TIE 12 (SUTURE) ×1 IMPLANT
SUT VIC AB 1 CTX 36 (SUTURE)
SUT VIC AB 1 CTX36XBRD ANBCTR (SUTURE) IMPLANT
TOWEL OR 17X24 6PK STRL BLUE (TOWEL DISPOSABLE) ×3 IMPLANT
TOWEL OR 17X26 10 PK STRL BLUE (TOWEL DISPOSABLE) ×3 IMPLANT
TUBE ANAEROBIC SPECIMEN COL (MISCELLANEOUS) IMPLANT
TUBE GAUZE SZ 6 (GAUZE/BANDAGES/DRESSINGS) ×2 IMPLANT
WATER STERILE IRR 1000ML POUR (IV SOLUTION) ×3 IMPLANT

## 2014-05-30 NOTE — Transfer of Care (Signed)
Immediate Anesthesia Transfer of Care Note  Patient: [name]  Procedure(s) Performed: Procedure(s): Left Below Knee Amputation Revision (Left)  Patient Location: PACU  Anesthesia Type:General  Level of Consciousness: awake and alert   Airway & Oxygen Therapy: Patient Spontanous Breathing and Patient connected to nasal cannula oxygen  Post-op Assessment: Report given to PACU RN, Post -op Vital signs reviewed and stable and Patient moving all extremities X 4  Post vital signs: Reviewed and stable  Complications: No apparent anesthesia complications

## 2014-05-30 NOTE — Anesthesia Postprocedure Evaluation (Signed)
  Anesthesia Post-op Note  Patient: [name]  Procedure(s) Performed: Procedure(s) (LRB): Left Below Knee Amputation Revision (Left)  Patient Location: PACU  Anesthesia Type: General  Level of Consciousness: awake and alert   Airway and Oxygen Therapy: Patient Spontanous Breathing  Post-op Pain: mild  Post-op Assessment: Post-op Vital signs reviewed, Patient's Cardiovascular Status Stable, Respiratory Function Stable, Patent Airway and No signs of Nausea or vomiting  Last Vitals:  Filed Vitals:   05/30/14 1400  BP: 150/93  Pulse: 59  Temp:   Resp: 20    Post-op Vital Signs: stable   Complications: No apparent anesthesia complications

## 2014-05-30 NOTE — Discharge Summary (Signed)
Final diagnosis dehiscence left transtibial amputation with osteomyelitis. Discharge to home in stable condition. Surgical procedure revision amputation left transtibial amputation with placement of antibiotic beads. Prescription for Percocet for pain and Xanax for anxiety. Followup in one to 2 weeks.

## 2014-05-30 NOTE — Anesthesia Preprocedure Evaluation (Addendum)
Anesthesia Evaluation  Patient identified by MRN, date of birth, ID band Patient awake  General Assessment Comment: Diabetes mellitus, type 2     .  Essential hypertension, benign     .  Chronic systolic heart failure     .  Coronary atherosclerosis of native coronary artery         Nonobstructive   .  Nonischemic cardiomyopathy         LVEF 20% up to 45%   .  PAD (peripheral artery disease)         Left foot gangrene   .  Anxiety     .  Depression     .  MRSA bacteremia  January 2015       Associated with probable tricuspid valve vegetation, ICD system extracted February 2050   .  Atrial fibrillation     .  Myocardial infarction  2011   .  CHF (congestive heart failure)     .  Anemia         had to have blood transfusion   .  Constipation       Reviewed: Allergy & Precautions, H&P , NPO status , Patient's Chart, lab work & pertinent test results  Airway Mallampati: II TM Distance: >3 FB Neck ROM: Full    Dental no notable dental hx. (+) Upper Dentures, Lower Dentures   Pulmonary neg pulmonary ROS,  breath sounds clear to auscultation  Pulmonary exam normal       Cardiovascular hypertension, Pt. on medications and Pt. on home beta blockers + CAD, + Past MI, + Peripheral Vascular Disease and +CHF negative cardio ROS  + dysrhythmias Atrial Fibrillation Rhythm:Regular Rate:Normal  EF 20- 45% Mild aortic stenosis.  ECG:SB 67,  LBBB  MI 2011   Neuro/Psych PSYCHIATRIC DISORDERS Anxiety Depression negative neurological ROS     GI/Hepatic negative GI ROS, Neg liver ROS,   Endo/Other  diabetes, Type 1, Insulin Dependent  Renal/GU negative Renal ROS  negative genitourinary   Musculoskeletal negative musculoskeletal ROS (+)   Abdominal   Peds negative pediatric ROS (+)  Hematology  (+) anemia ,   Anesthesia Other Findings   Reproductive/Obstetrics negative OB ROS                       Anesthesia Physical Anesthesia Plan  ASA: IV  Anesthesia Plan: General   Post-op Pain Management:    Induction: Intravenous  Airway Management Planned: LMA  Additional Equipment:   Intra-op Plan:   Post-operative Plan: Extubation in OR  Informed Consent: I have reviewed the patients History and Physical, chart, labs and discussed the procedure including the risks, benefits and alternatives for the proposed anesthesia with the patient or authorized representative who has indicated his/her understanding and acceptance.   Dental advisory given  Plan Discussed with: CRNA  Anesthesia Plan Comments: (LMA4 last time)        Anesthesia Quick Evaluation

## 2014-05-30 NOTE — Progress Notes (Signed)
Called for sign out

## 2014-05-30 NOTE — Anesthesia Procedure Notes (Addendum)
Procedure Name: LMA Insertion Date/Time: 05/30/2014 10:51 AM Performed by: Blair Heys E Pre-anesthesia Checklist: Patient identified, Timeout performed, Emergency Drugs available, Suction available and Patient being monitored Patient Re-evaluated:Patient Re-evaluated prior to inductionOxygen Delivery Method: Circle system utilized Preoxygenation: Pre-oxygenation with 100% oxygen Intubation Type: IV induction Ventilation: Mask ventilation without difficulty LMA: LMA inserted LMA Size: 4.0 Number of attempts: 1 Placement Confirmation: breath sounds checked- equal and bilateral and positive ETCO2 Tube secured with: Tape Dental Injury: Teeth and Oropharynx as per pre-operative assessment

## 2014-05-30 NOTE — Discharge Instructions (Signed)
Followup 1-2 weeks to change the dressing.

## 2014-05-30 NOTE — Op Note (Signed)
05/30/2014  11:18 AM  PATIENT:  Vesta Mixer Esquilin    PRE-OPERATIVE DIAGNOSIS:  abscess left below knee amputation  POST-OPERATIVE DIAGNOSIS:  Same  PROCEDURE:  Left Below Knee Amputation Revision  SURGEON:  Newt Minion, MD  PHYSICIAN ASSISTANT:None ANESTHESIA:   General  PREOPERATIVE INDICATIONS:  MEIA DALSANTO is a  68 y.o. female with a diagnosis of abscess left below knee amputation who failed conservative measures and elected for surgical management.    The risks benefits and alternatives were discussed with the patient preoperatively including but not limited to the risks of infection, bleeding, nerve injury, cardiopulmonary complications, the need for revision surgery, among others, and the patient was willing to proceed.  OPERATIVE IMPLANTS: Stimulant beads with 500 mg vancomycin and 160 mg gentamicin  OPERATIVE FINDINGS: Ischemic muscle  OPERATIVE PROCEDURE: Patient is a 68 year old woman with peripheral vascular disease diabetes who presents with dehiscence of the transtibial amputation. The ulcer extends down to bone. She has failed conservative care and presents at this time for surgical revision. Risks and benefits were discussed including potential need for higher level amputation. Patient states she understands and wishes to proceed at this time. Description of procedure patient was brought to the operating room and the left lower sternal he was prepped using DuraPrep draped into a sterile field after general anesthetic. Timeout was called. A fishmouth incision was made to encompass the ulcer the tissue on the entire surgical incision as well as to resect 2 cm of the distal tibia and fibula. Electrocautery was used for hemostasis. The wound was irrigated with normal saline. Antibiotic beads were placed deep within the wound. The muscle had good color good consistency in good contractility. The skin was closed using 2-0 nylon. A sterile compressive dressing was applied.  Patient was extubated taken to the PACU in stable condition. Plan for discharge to home.

## 2014-05-30 NOTE — H&P (Signed)
[name] is an 68 y.o. female.   Chief Complaint: Dehiscence left transtibial amputation HPI: Patient is a 68 year old woman with peripheral vascular disease diabetic insensate neuropathy who has developed progressive dehiscence of the left transtibial amputation.  Past Medical History  Diagnosis Date  . Diabetes mellitus, type 2   . Essential hypertension, benign   . Chronic systolic heart failure   . Coronary atherosclerosis of native coronary artery     Nonobstructive  . Nonischemic cardiomyopathy     LVEF 20% up to 45%  . PAD (peripheral artery disease)     Left foot gangrene  . Anxiety   . Depression   . MRSA bacteremia January 2015    Associated with probable tricuspid valve vegetation, ICD system extracted February 2050  . Atrial fibrillation   . Myocardial infarction 2011  . CHF (congestive heart failure)   . Anemia     had to have blood transfusion  . Constipation     Past Surgical History  Procedure Laterality Date  . Abdominal hysterectomy    . Left shoulder surgery      Rotator Cuff  . Breast lumpectomy      Bil  . Cardiac defibrillator placement      Medtronic [id number]   . Exploratory laparotomy with lysis of adhesions    . Bladder surgery  2011 BENIGN MASS REMOVED FROM BLADDER  . Eye surgery  BILATERAL CATARACTS REMOVAL  10 YEARS AGO PER PATIENT  . Pr vein bypass graft,aorto-fem-pop  05-2011    Left popliteal- PTA graft   . Viterectomy  11/10/2012    OD   Dr Zigmund Daniel  . 25 gauge pars plana vitrectomy with 20 gauge mvr port for macular hole Right 11/10/2012    Procedure: 92 GAUGE PARS PLANA VITRECTOMY WITH 20 GAUGE MVR PORT FOR MACULAR HOLE;  Surgeon: Hayden Pedro, MD;  Location: [place];  Service: Ophthalmology;  Laterality: Right;  . Gas insertion Right 11/10/2012    Procedure: INSERTION OF GAS;  Surgeon: Hayden Pedro, MD;  Location: [place];  Service: Ophthalmology;  Laterality: Right;  C3F8  . Serum patch Right 11/10/2012    Procedure: SERUM PATCH;   Surgeon: Hayden Pedro, MD;  Location: [place];  Service: Ophthalmology;  Laterality: Right;  . 25 gauge pars plana vitrectomy with 20 gauge mvr port for macular hole Right 05/09/2013    Procedure: 3 GAUGE PARS PLANA VITRECTOMY WITH 20 GAUGE MVR PORT FOR MACULAR HOLE;  Surgeon: Hayden Pedro, MD;  Location: [place];  Service: Ophthalmology;  Laterality: Right;  . Gas/fluid exchange Right 05/09/2013    Procedure: GAS/FLUID EXCHANGE;  Surgeon: Hayden Pedro, MD;  Location: [place];  Service: Ophthalmology;  Laterality: Right;  C3F8   . Serum patch Right 05/09/2013    Procedure: SERUM PATCH;  Surgeon: Hayden Pedro, MD;  Location: [place];  Service: Ophthalmology;  Laterality: Right;  . Laser photo ablation Right 05/09/2013    Procedure: LASER PHOTO ABLATION;  Surgeon: Hayden Pedro, MD;  Location: [place];  Service: Ophthalmology;  Laterality: Right;  Endolaser   . Membrane peel Right 05/09/2013    Procedure: MEMBRANE PEEL;  Surgeon: Hayden Pedro, MD;  Location: [place];  Service: Ophthalmology;  Laterality: Right;  . Icd lead removal N/A 11/09/2013    Procedure: ICD LEAD REMOVAL;  Surgeon: Evans Lance, MD;  Location: [place];  Service: Cardiovascular;  Laterality: N/A;  . Central venous catheter insertion Right 11/09/2013  Procedure: INSERTION CENTRAL LINE ADULT;  Surgeon: Evans Lance, MD;  Location: [place];  Service: Cardiovascular;  Laterality: Right;  . Amputation Left 03/23/2014    Procedure: Left Foot 1st Ray Amputation;  Surgeon: Newt Minion, MD;  Location: [place];  Service: Orthopedics;  Laterality: Left;  Left Foot 1st Ray Amputation  . Toe amputation Left     great toe  . Amputation Left 04/13/2014    Procedure: AMPUTATION BELOW KNEE;  Surgeon: Newt Minion, MD;  Location: [place];  Service: Orthopedics;  Laterality: Left;  Left Below Knee Amputation    Family History  Problem Relation Age of Onset  . Cirrhosis Father   . Cancer Mother     Ovarian  . Ovarian cancer Mother   .  Hypertension Mother    Social History:  reports that she has never smoked. She has never used smokeless tobacco. She reports that she does not drink alcohol or use illicit drugs.  Allergies:  Allergies  Allergen Reactions  . Hydrocodone-Acetaminophen Other (See Comments)    hallucinations    No prescriptions prior to admission    No results found for this or any previous visit (from the past 48 hour(s)). No results found.  Review of Systems  All other systems reviewed and are negative.   There were no vitals taken for this visit. Physical Exam   On examination patient has dehiscence of the left transtibial amputation. There is drainage and necrosis. Assessment/Plan Assessment: Dehiscence left transtibial amputation.  Plan: Will plan for revision amputation. Risks and benefits were discussed including infection neurovascular injury nonhealing of the wound need for additional surgery. Patient states she understands and wishes to proceed at this time.  [name] V 05/30/2014, 6:41 AM

## 2014-06-01 ENCOUNTER — Encounter (HOSPITAL_COMMUNITY): Payer: Self-pay | Admitting: Orthopedic Surgery

## 2014-06-15 ENCOUNTER — Other Ambulatory Visit: Payer: Self-pay

## 2014-06-15 MED ORDER — HYDRALAZINE HCL 100 MG PO TABS: 100.0000 mg | ORAL_TABLET | Freq: Three times a day (TID) | ORAL | Status: DC

## 2014-06-18 ENCOUNTER — Ambulatory Visit (INDEPENDENT_AMBULATORY_CARE_PROVIDER_SITE_OTHER): Payer: Medicare Other | Admitting: Ophthalmology

## 2014-06-18 DIAGNOSIS — I10 Essential (primary) hypertension: Secondary | ICD-10-CM

## 2014-06-18 DIAGNOSIS — H35039 Hypertensive retinopathy, unspecified eye: Secondary | ICD-10-CM

## 2014-06-18 DIAGNOSIS — E1139 Type 2 diabetes mellitus with other diabetic ophthalmic complication: Secondary | ICD-10-CM

## 2014-06-18 DIAGNOSIS — H43819 Vitreous degeneration, unspecified eye: Secondary | ICD-10-CM

## 2014-06-18 DIAGNOSIS — H35349 Macular cyst, hole, or pseudohole, unspecified eye: Secondary | ICD-10-CM

## 2014-06-18 DIAGNOSIS — E11359 Type 2 diabetes mellitus with proliferative diabetic retinopathy without macular edema: Secondary | ICD-10-CM

## 2014-06-18 DIAGNOSIS — E1165 Type 2 diabetes mellitus with hyperglycemia: Secondary | ICD-10-CM

## 2014-06-20 ENCOUNTER — Telehealth: Payer: Self-pay | Admitting: Cardiology

## 2014-06-20 MED ORDER — CARVEDILOL 25 MG PO TABS: 25.0000 mg | ORAL_TABLET | Freq: Two times a day (BID) | ORAL | Status: DC

## 2014-06-20 NOTE — Telephone Encounter (Signed)
Medication sent to pharmacy  

## 2014-06-20 NOTE — Telephone Encounter (Signed)
Needs refill on Carvedilol sent to [facility] in [place] / tgs

## 2014-07-11 ENCOUNTER — Ambulatory Visit (INDEPENDENT_AMBULATORY_CARE_PROVIDER_SITE_OTHER): Payer: Medicare Other | Admitting: Cardiology

## 2014-07-11 ENCOUNTER — Encounter: Payer: Self-pay | Admitting: Cardiology

## 2014-07-11 VITALS — BP 122/60 | HR 60 | Ht 61.0 in | Wt 144.0 lb

## 2014-07-11 DIAGNOSIS — I70219 Atherosclerosis of native arteries of extremities with intermittent claudication, unspecified extremity: Secondary | ICD-10-CM

## 2014-07-11 DIAGNOSIS — I5022 Chronic systolic (congestive) heart failure: Secondary | ICD-10-CM

## 2014-07-11 DIAGNOSIS — E785 Hyperlipidemia, unspecified: Secondary | ICD-10-CM

## 2014-07-11 DIAGNOSIS — I428 Other cardiomyopathies: Secondary | ICD-10-CM

## 2014-07-11 DIAGNOSIS — I429 Cardiomyopathy, unspecified: Secondary | ICD-10-CM

## 2014-07-11 DIAGNOSIS — I10 Essential (primary) hypertension: Secondary | ICD-10-CM

## 2014-07-11 NOTE — Assessment & Plan Note (Signed)
Symptomatically stable. No change in current regimen. Followup BMET for next visit in 3 months.

## 2014-07-11 NOTE — Assessment & Plan Note (Signed)
Blood pressure well-controlled today. 

## 2014-07-11 NOTE — Patient Instructions (Signed)
Your physician wants you to follow-up in: 3 months with Dr. Ferne Reus will receive a reminder letter in the mail two months in advance. If you don't receive a letter, please call our office to schedule the follow-up appointment.  Your physician recommends that you return for lab work in: 3 months BMP  Your physician recommends that you continue on your current medications as directed. Please refer to the Current Medication list given to you today.  Thank you for choosing [facility]!!

## 2014-07-11 NOTE — Progress Notes (Signed)
Clinical Summary Ms. Maciolek is a 68 y.o.female last seen in May. Interval records find hospitalization with gangrene of the left foot, now status post below the knee th knee dictation on the left. Had no obvious cardiac complications based on record review.  She is here with her granddaughter today. In a wheelchair. Has not yet been fitted for cast and prosthesis on the left. She reports stable dyspnea on exertion, no significant fluid gain. Indicates compliance with her medications.  Lab work from August showed hemoglobin 9.2, platelets 398, potassium 4.2, BUN 71, creatinine 2.5.  Her weight is down 13 pounds from August.   Allergies  Allergen Reactions  . Hydrocodone-Acetaminophen Other (See Comments)    hallucinations    Current Outpatient Prescriptions  Medication Sig Dispense Refill  . ALPRAZolam (XANAX) 0.5 MG tablet Take 1 tablet (0.5 mg total) by mouth 3 (three) times daily as needed for anxiety.  30 tablet  0  . amLODipine (NORVASC) 10 MG tablet Take 1 tablet (10 mg total) by mouth daily.  30 tablet  6  . aspirin 81 MG tablet Take 81 mg by mouth daily.       . carvedilol (COREG) 25 MG tablet Take 1 tablet (25 mg total) by mouth 2 (two) times daily with a meal.  60 tablet  6  . Cholecalciferol (VITAMIN D-3) 1000 UNITS CAPS Take 1,000 Units by mouth daily.      Marland Kitchen docusate sodium (COLACE) 100 MG capsule Take 100 mg by mouth daily as needed (constipation).       . furosemide (LASIX) 80 MG tablet Take 40-80 mg by mouth 2 (two) times daily. Takes 80 mg in the morning and 40 mg in the morning      . hydrALAZINE (APRESOLINE) 100 MG tablet Take 1 tablet (100 mg total) by mouth 3 (three) times daily.  90 tablet  3  . insulin glargine (LANTUS) 100 UNIT/ML injection Inject 35 Units into the skin daily.      . insulin lispro (HUMALOG) 100 UNIT/ML injection Inject 0-8 Units into the skin every evening. Sliding scale: 200-249=2 units, 250-299=4 units, 300-349=6 units, >350=8 units        . Multiple Vitamins-Minerals (MULTIVITAMIN WITH MINERALS) tablet Take 1 tablet by mouth daily.      . nitroGLYCERIN (NITROSTAT) 0.4 MG SL tablet Place 0.4 mg under the tongue every 5 (five) minutes as needed for chest pain.       Marland Kitchen oxyCODONE-acetaminophen (ROXICET) 5-325 MG per tablet Take 1 tablet by mouth every 4 (four) hours as needed for severe pain.  60 tablet  0  . polyethylene glycol (MIRALAX / GLYCOLAX) packet Take 17 g by mouth daily as needed (constipation).       . sodium bicarbonate 650 MG tablet Take 650 mg by mouth 3 (three) times daily.      . vitamin C (ASCORBIC ACID) 500 MG tablet Take 500 mg by mouth daily.       No current facility-administered medications for this visit.    Past Medical History  Diagnosis Date  . Diabetes mellitus, type 2   . Essential hypertension, benign   . Chronic systolic heart failure   . Coronary atherosclerosis of native coronary artery     Nonobstructive  . Nonischemic cardiomyopathy     LVEF 20% up to 45%  . PAD (peripheral artery disease)     Left foot gangrene  . Anxiety   . Depression   . MRSA bacteremia January 2015  Associated with probable tricuspid valve vegetation, ICD system extracted February 2050  . Anemia     Social History Ms. Notz reports that she has never smoked. She has never used smokeless tobacco. Ms. Weisenbach reports that she does not drink alcohol.  Review of Systems Stable appetite, states that she is limiting salt and fluid intake. Other systems reviewed and negative except as outlined.  Physical Examination Filed Vitals:   07/11/14 0919  BP: 122/60  Pulse: 60   Filed Weights   07/11/14 0919  Weight: 144 lb (65.318 kg)    [name]. In wheelchair. HEENT: Conjunctiva and lids normal, oropharynx clear.  Neck: No carotid bruis, no thyromegaly.  Lungs: Clear with diminished breath sounds, decreased at bases, nonlabored.  Cardiac: Indistinct PMI, regular rate and rhythm,  2/6 systolic murmur at the base, paradoxically split S2, no S3.  Abdomen: Protuberant.  Extremities: Trace edema on right, status post left BKA, diminished distal pulses..    Problem List and Plan   Chronic systolic heart failure Symptomatically stable. No change in current regimen. Followup BM[facility] for next visit in 3 months.  Nonischemic cardiomyopathy LVEF approximately 45% by most recent assessment. Patient is status post ICD extraction in the setting of endocarditis.  Essential hypertension, benign Blood pressure well controlled today.    Satira Sark, M.D., F.A.C.C.

## 2014-07-11 NOTE — Assessment & Plan Note (Signed)
LVEF approximately 45% by most recent assessment. Patient is status post ICD extraction in the setting of endocarditis.

## 2014-10-13 LAB — BASIC METABOLIC PANEL WITH GFR
BUN: 66 mg/dL — ABNORMAL HIGH (ref 6–23)
CHLORIDE: 105 meq/L (ref 96–112)
CO2: 25 meq/L (ref 19–32)
CREATININE: 2.29 mg/dL — AB (ref 0.50–1.10)
Calcium: 8.9 mg/dL (ref 8.4–10.5)
GFR, EST NON AFRICAN AMERICAN: 21 mL/min — AB
GFR, Est African American: 25 mL/min — ABNORMAL LOW
GLUCOSE: 82 mg/dL (ref 70–99)
Potassium: 4 mEq/L (ref 3.5–5.3)
Sodium: 142 mEq/L (ref 135–145)

## 2014-10-16 ENCOUNTER — Encounter: Payer: Self-pay | Admitting: Cardiology

## 2014-10-16 ENCOUNTER — Ambulatory Visit (INDEPENDENT_AMBULATORY_CARE_PROVIDER_SITE_OTHER): Payer: Medicare Other | Admitting: Cardiology

## 2014-10-16 VITALS — BP 158/68 | HR 63 | Ht 61.0 in | Wt 165.0 lb

## 2014-10-16 DIAGNOSIS — I10 Essential (primary) hypertension: Secondary | ICD-10-CM

## 2014-10-16 DIAGNOSIS — I429 Cardiomyopathy, unspecified: Secondary | ICD-10-CM

## 2014-10-16 DIAGNOSIS — I5022 Chronic systolic (congestive) heart failure: Secondary | ICD-10-CM

## 2014-10-16 DIAGNOSIS — I428 Other cardiomyopathies: Secondary | ICD-10-CM

## 2014-10-16 NOTE — Patient Instructions (Signed)
Your physician recommends that you schedule a follow-up appointment in: 3 months with Dr. [name]  Your physician recommends that you continue on your current medications as directed. Please refer to the Current Medication list given to you today.  Your physician recommends that you return for lab work in: just before you next visit.   Thank you for choosing [facility]!

## 2014-10-16 NOTE — Progress Notes (Signed)
Reason for visit: Cardiomyopathy, hypertension  Clinical Summary Ms. Battenfield is a medically complex 69 y.o.female last seen in October 2015. Since that time, she has completed physical therapy and now has a left leg prosthesis. Still using a walker. Denies any falls. Reports typically NYHA class II dyspnea.  Weight has gone up about 20 pounds since the last visit, she states she has been eating a lot more. Has not been weighing herself regularly. I did discuss this with her today. Reports stable diuretic regimen. Recent lab work showed potassium 4.0, BUN 66, creatinine 2.2 which is down from 2.5.  LVEF by echocardiogram in February 2015 was approximately 45% with grade 1 diastolic dysfunction.   Allergies  Allergen Reactions  . Hydrocodone-Acetaminophen Other (See Comments)    hallucinations    Current Outpatient Prescriptions  Medication Sig Dispense Refill  . amLODipine (NORVASC) 10 MG tablet Take 1 tablet (10 mg total) by mouth daily. 30 tablet 6  . aspirin 81 MG tablet Take 81 mg by mouth daily.     . carvedilol (COREG) 25 MG tablet Take 1 tablet (25 mg total) by mouth 2 (two) times daily with a meal. 60 tablet 6  . Cholecalciferol (VITAMIN D-3) 1000 UNITS CAPS Take 1,000 Units by mouth daily.    Marland Kitchen docusate sodium (COLACE) 100 MG capsule Take 100 mg by mouth daily as needed (constipation).     . furosemide (LASIX) 80 MG tablet Take 40-80 mg by mouth 2 (two) times daily. Takes 80 mg in the morning and 40 mg in the morning    . hydrALAZINE (APRESOLINE) 100 MG tablet Take 1 tablet (100 mg total) by mouth 3 (three) times daily. 90 tablet 3  . insulin glargine (LANTUS) 100 UNIT/ML injection Inject 35 Units into the skin daily.    . insulin lispro (HUMALOG) 100 UNIT/ML injection Inject 0-8 Units into the skin every evening. Sliding scale: 200-249=2 units, 250-299=4 units, 300-349=6 units, >350=8 units    . Multiple Vitamins-Minerals (MULTIVITAMIN WITH MINERALS) tablet Take 1 tablet by  mouth daily.    . nitroGLYCERIN (NITROSTAT) 0.4 MG SL tablet Place 0.4 mg under the tongue every 5 (five) minutes as needed for chest pain.     . polyethylene glycol (MIRALAX / GLYCOLAX) packet Take 17 g by mouth daily as needed (constipation).     . sodium bicarbonate 650 MG tablet Take 650 mg by mouth 3 (three) times daily.    . vitamin C (ASCORBIC ACID) 500 MG tablet Take 500 mg by mouth daily.     No current facility-administered medications for this visit.    Past Medical History  Diagnosis Date  . Diabetes mellitus, type 2   . Essential hypertension, benign   . Chronic systolic heart failure   . Coronary atherosclerosis of native coronary artery     Nonobstructive  . Nonischemic cardiomyopathy     LVEF 20% up to 45%  . PAD (peripheral artery disease)     Left foot gangrene  . Anxiety   . Depression   . MRSA bacteremia January 2015    Associated with probable tricuspid valve vegetation, ICD system extracted February 2050  . Anemia     Past Surgical History  Procedure Laterality Date  . Abdominal hysterectomy    . Left shoulder surgery      Rotator Cuff  . Breast lumpectomy      Bil  . Cardiac defibrillator placement      Medtronic [id number]   . Exploratory laparotomy with  lysis of adhesions    . Bladder surgery  2011 BENIGN MASS REMOVED FROM BLADDER  . Eye surgery  BILATERAL CATARACTS REMOVAL  10 YEARS AGO PER PATIENT  . Pr vein bypass graft,aorto-fem-pop  05-2011    Left popliteal- PTA graft   . Viterectomy  11/10/2012    OD   Dr Zigmund Daniel  . 25 gauge pars plana vitrectomy with 20 gauge mvr port for macular hole Right 11/10/2012    Procedure: 54 GAUGE PARS PLANA VITRECTOMY WITH 20 GAUGE MVR PORT FOR MACULAR HOLE;  Surgeon: Hayden Pedro, MD;  Location: [place];  Service: Ophthalmology;  Laterality: Right;  . Gas insertion Right 11/10/2012    Procedure: INSERTION OF GAS;  Surgeon: Hayden Pedro, MD;  Location: [place];  Service: Ophthalmology;  Laterality: Right;  C3F8  .  Serum patch Right 11/10/2012    Procedure: SERUM PATCH;  Surgeon: Hayden Pedro, MD;  Location: [place];  Service: Ophthalmology;  Laterality: Right;  . 25 gauge pars plana vitrectomy with 20 gauge mvr port for macular hole Right 05/09/2013    Procedure: 85 GAUGE PARS PLANA VITRECTOMY WITH 20 GAUGE MVR PORT FOR MACULAR HOLE;  Surgeon: Hayden Pedro, MD;  Location: [place];  Service: Ophthalmology;  Laterality: Right;  . Gas/fluid exchange Right 05/09/2013    Procedure: GAS/FLUID EXCHANGE;  Surgeon: Hayden Pedro, MD;  Location: [place];  Service: Ophthalmology;  Laterality: Right;  C3F8   . Serum patch Right 05/09/2013    Procedure: SERUM PATCH;  Surgeon: Hayden Pedro, MD;  Location: [place];  Service: Ophthalmology;  Laterality: Right;  . Laser photo ablation Right 05/09/2013    Procedure: LASER PHOTO ABLATION;  Surgeon: Hayden Pedro, MD;  Location: [place];  Service: Ophthalmology;  Laterality: Right;  Endolaser   . Membrane peel Right 05/09/2013    Procedure: MEMBRANE PEEL;  Surgeon: Hayden Pedro, MD;  Location: [place];  Service: Ophthalmology;  Laterality: Right;  . Icd lead removal N/A 11/09/2013    Procedure: ICD LEAD REMOVAL;  Surgeon: Evans Lance, MD;  Location: [place];  Service: Cardiovascular;  Laterality: N/A;  . Central venous catheter insertion Right 11/09/2013    Procedure: INSERTION CENTRAL LINE ADULT;  Surgeon: Evans Lance, MD;  Location: [place];  Service: Cardiovascular;  Laterality: Right;  . Amputation Left 03/23/2014    Procedure: Left Foot 1st Ray Amputation;  Surgeon: Newt Minion, MD;  Location: [place];  Service: Orthopedics;  Laterality: Left;  Left Foot 1st Ray Amputation  . Toe amputation Left     great toe  . Amputation Left 04/13/2014    Procedure: AMPUTATION BELOW KNEE;  Surgeon: Newt Minion, MD;  Location: [place];  Service: Orthopedics;  Laterality: Left;  Left Below Knee Amputation  . Stump revision Left 05/30/2014    Procedure: Left Below Knee Amputation Revision;   Surgeon: Newt Minion, MD;  Location: [place];  Service: Orthopedics;  Laterality: Left;    Social History Ms. Belous reports that she has never smoked. She has never used smokeless tobacco. Ms. Boodoo reports that she does not drink alcohol.  Review of Systems Complete review of systems negative except as otherwise outlined in the clinical summary and also the following. No palpitations or syncope.  Physical Examination Filed Vitals:   10/16/14 1117  BP: 158/68  Pulse: 63   Filed Weights   10/16/14 1117  Weight: 165 lb (74.844 kg)    Overweight woman,  appears comfortable at rest. HEENT: Conjunctiva and lids normal, oropharynx clear.  Neck: No carotid bruis, no thyromegaly.  Lungs: Clear with diminished breath sounds, decreased at bases, nonlabored.  Cardiac: Indistinct PMI, regular rate and rhythm, 2/6 systolic murmur at the base, paradoxically split S2, no S3.  Abdomen: Protuberant.  Extremities: Trace edema on right, status post left BKA with prosthesis in place, diminished distal pulses..    Problem List and Plan   Chronic systolic heart failure Continue current medical regimen including Coreg, Norvasc, hydralazine, and Lasix. We discussed making sure that she is weighing herself daily, limiting sodium and excess fluid intake. Follow-up arranged in the next 3 months with [facility].  Nonischemic cardiomyopathy LVEF approximately 45% by most recent assessment. Patient is status post ICD extraction in the setting of endocarditis.    Satira Sark, M.D., F.A.C.C.

## 2014-10-16 NOTE — Assessment & Plan Note (Signed)
Continue current medical regimen including Coreg, Norvasc, hydralazine, and Lasix. We discussed making sure that she is weighing herself daily, limiting sodium and excess fluid intake. Follow-up arranged in the next 3 months with [facility].

## 2014-10-16 NOTE — Assessment & Plan Note (Signed)
LVEF approximately 45% by most recent assessment. Patient is status post ICD extraction in the setting of endocarditis.

## 2014-10-19 NOTE — Addendum Note (Signed)
Addended by: Levonne Hubert on: 10/19/2014 09:25 AM   Modules accepted: Orders

## 2014-11-07 ENCOUNTER — Other Ambulatory Visit: Payer: Self-pay | Admitting: Cardiology

## 2014-11-07 MED ORDER — HYDRALAZINE HCL 100 MG PO TABS: 100.0000 mg | ORAL_TABLET | Freq: Three times a day (TID) | ORAL | Status: DC

## 2014-11-07 NOTE — Telephone Encounter (Signed)
Needs Hydralazine called to [facility]

## 2014-11-30 ENCOUNTER — Encounter: Payer: Self-pay | Admitting: Cardiology

## 2014-12-04 ENCOUNTER — Other Ambulatory Visit: Payer: Self-pay | Admitting: Cardiology

## 2014-12-21 ENCOUNTER — Telehealth: Payer: Self-pay | Admitting: Vascular Surgery

## 2014-12-21 NOTE — Telephone Encounter (Signed)
-----   Message from Denman George, RN sent at 12/21/2014  2:43 PM EDT ----- Regarding: RE: Schedule follow up Contact: [phone] She has a hx of Left Pop- Post Tibial BP 05/2011.  I will need to ask Dr. Donnetta Hutching about what vascular labs needed, due to a Left BKA per Dr. [name] 04/2014.  I think we will just do ABI's, but I need to check with him.  I'll let you know on Tuesday.  ----- Message -----    From: Gena Fray    Sent: 12/19/2014   1:50 PM      To: Denman George, RN Subject: Schedule follow up                             Arbie Cookey,  Ms Newitt is seeing Dr [name], but was not sure if she needs follow up with Dr Early any time soon. She was last here in 03/2014. His note does not indicate her plan for our office. Could you advise.  Thanks! Hinton Dyer

## 2014-12-24 ENCOUNTER — Ambulatory Visit (INDEPENDENT_AMBULATORY_CARE_PROVIDER_SITE_OTHER): Payer: Medicare Other | Admitting: Ophthalmology

## 2014-12-24 DIAGNOSIS — E11319 Type 2 diabetes mellitus with unspecified diabetic retinopathy without macular edema: Secondary | ICD-10-CM | POA: Diagnosis not present

## 2014-12-24 DIAGNOSIS — I10 Essential (primary) hypertension: Secondary | ICD-10-CM | POA: Diagnosis not present

## 2014-12-24 DIAGNOSIS — E11359 Type 2 diabetes mellitus with proliferative diabetic retinopathy without macular edema: Secondary | ICD-10-CM

## 2014-12-24 DIAGNOSIS — H35341 Macular cyst, hole, or pseudohole, right eye: Secondary | ICD-10-CM

## 2014-12-24 DIAGNOSIS — H35033 Hypertensive retinopathy, bilateral: Secondary | ICD-10-CM

## 2014-12-24 DIAGNOSIS — H43812 Vitreous degeneration, left eye: Secondary | ICD-10-CM

## 2014-12-25 ENCOUNTER — Telehealth: Payer: Self-pay | Admitting: Vascular Surgery

## 2014-12-25 NOTE — Telephone Encounter (Signed)
I spoke with [name] to let her know that we do not need to keep her on surveillance, but she understands to call us in the future should problems arise. dpm

## 2014-12-25 NOTE — Telephone Encounter (Signed)
-----   Message from Denman George, RN sent at 12/24/2014  3:04 PM EDT ----- Regarding: RE: Schedule follow up Contact: [phone] Per Dr. Donnetta Hutching, since this pt. has had a left Below Knee Amputation (04/13/14), she does not require continued surveillance of the left LE BPG.  He also stated we would not keep her under PVD surveillance for the right leg, unless she developed any problems with that leg.  So at this point, no plan for f/u. Thanks.    ----- Message -----    From: Gena Fray    Sent: 12/19/2014   1:50 PM      To: Denman George, RN Subject: Schedule follow up                             [name],  [name] is seeing Dr [name], but was not sure if she needs follow up with Dr Early any time soon. She was last here in 03/2014. His note does not indicate her plan for our office. Could you advise.  Thanks! [name]

## 2015-01-26 LAB — BASIC METABOLIC PANEL
BUN: 66 mg/dL — ABNORMAL HIGH (ref 6–23)
CO2: 24 meq/L (ref 19–32)
Calcium: 8.9 mg/dL (ref 8.4–10.5)
Chloride: 109 mEq/L (ref 96–112)
Creat: 1.94 mg/dL — ABNORMAL HIGH (ref 0.50–1.10)
Glucose, Bld: 96 mg/dL (ref 70–99)
POTASSIUM: 4.1 meq/L (ref 3.5–5.3)
SODIUM: 144 meq/L (ref 135–145)

## 2015-01-30 ENCOUNTER — Encounter: Payer: Self-pay | Admitting: Cardiology

## 2015-01-30 ENCOUNTER — Ambulatory Visit (INDEPENDENT_AMBULATORY_CARE_PROVIDER_SITE_OTHER): Payer: Medicare Other | Admitting: Cardiology

## 2015-01-30 VITALS — BP 140/60 | HR 68 | Ht 61.0 in | Wt 174.5 lb

## 2015-01-30 DIAGNOSIS — I428 Other cardiomyopathies: Secondary | ICD-10-CM

## 2015-01-30 DIAGNOSIS — I5043 Acute on chronic combined systolic (congestive) and diastolic (congestive) heart failure: Secondary | ICD-10-CM | POA: Diagnosis not present

## 2015-01-30 DIAGNOSIS — N183 Chronic kidney disease, stage 3 unspecified: Secondary | ICD-10-CM

## 2015-01-30 DIAGNOSIS — I10 Essential (primary) hypertension: Secondary | ICD-10-CM | POA: Diagnosis not present

## 2015-01-30 DIAGNOSIS — I429 Cardiomyopathy, unspecified: Secondary | ICD-10-CM

## 2015-01-30 MED ORDER — FUROSEMIDE 80 MG PO TABS: 80.0000 mg | ORAL_TABLET | Freq: Two times a day (BID) | ORAL | Status: DC

## 2015-01-30 NOTE — Patient Instructions (Signed)
Your physician recommends that you schedule a follow-up appointment in: 1 month with Dr.McDowell   INCREASE Lasix to 80 mg twice a day  Please weight self daily and record.Please call us back in a week to assess your weight loss    Please get lab work a few days before your next visit in 1 month ( [facility])    Thank you for choosing [facility] !

## 2015-01-30 NOTE — Progress Notes (Signed)
Cardiology Office Note  Date: 01/30/2015   ID: VEMA SKEIE, DOB 1946/07/24, MRN [id number]  PCP: [name], MD  Primary Cardiologist: Rozann Lesches, MD   Chief Complaint  Patient presents with  . Cardiomyopathy  . Hypertension    History of Present Illness: [name] is a medically complex 69 y.o. female last seen in January. She presents for a routine visit today. Since I last saw her, she continues to gain weight, reports a feeling of increased abdominal girth and some right leg swelling. She states she is compliant with her medications. We reviewed sodium and fluid intake, discussed anticipated restrictions.  Echocardiogram from last year is noted below, LVEF 45% at that time. She did have recent lab work showing creatinine 1.9.   We reviewed her medications and discussed advancing diuretics further. I also reinforced checking daily weights which she has not been doing consistently.  She does not report any typical exertional chest pain, has sporadic atypical thoracic discomfort, also some right elbow discomfort. No palpitations or syncope.   Past Medical History  Diagnosis Date  . Diabetes mellitus, type 2   . Essential hypertension, benign   . Chronic systolic heart failure   . Coronary atherosclerosis of native coronary artery     Nonobstructive  . Nonischemic cardiomyopathy     LVEF 20% up to 45%  . PAD (peripheral artery disease)     Left foot gangrene  . Anxiety   . Depression   . MRSA bacteremia January 2015    Associated with probable tricuspid valve vegetation, ICD system extracted February 2015  . Anemia     Past Surgical History  Procedure Laterality Date  . Abdominal hysterectomy    . Left shoulder surgery      Rotator Cuff  . Breast lumpectomy      Bil  . Cardiac defibrillator placement      Medtronic [id number]   . Exploratory laparotomy with lysis of adhesions    . Bladder surgery  2011 BENIGN MASS REMOVED FROM BLADDER  .  Eye surgery  BILATERAL CATARACTS REMOVAL  10 YEARS AGO PER PATIENT  . Pr vein bypass graft,aorto-fem-pop  05-2011    Left popliteal- PTA graft   . Viterectomy  11/10/2012    OD   Dr Zigmund Daniel  . 25 gauge pars plana vitrectomy with 20 gauge mvr port for macular hole Right 11/10/2012    Procedure: 53 GAUGE PARS PLANA VITRECTOMY WITH 20 GAUGE MVR PORT FOR MACULAR HOLE;  Surgeon: Hayden Pedro, MD;  Location: [place];  Service: Ophthalmology;  Laterality: Right;  . Gas insertion Right 11/10/2012    Procedure: INSERTION OF GAS;  Surgeon: Hayden Pedro, MD;  Location: [place];  Service: Ophthalmology;  Laterality: Right;  C3F8  . Serum patch Right 11/10/2012    Procedure: SERUM PATCH;  Surgeon: Hayden Pedro, MD;  Location: [place];  Service: Ophthalmology;  Laterality: Right;  . 25 gauge pars plana vitrectomy with 20 gauge mvr port for macular hole Right 05/09/2013    Procedure: 76 GAUGE PARS PLANA VITRECTOMY WITH 20 GAUGE MVR PORT FOR MACULAR HOLE;  Surgeon: Hayden Pedro, MD;  Location: [place];  Service: Ophthalmology;  Laterality: Right;  . Gas/fluid exchange Right 05/09/2013    Procedure: GAS/FLUID EXCHANGE;  Surgeon: Hayden Pedro, MD;  Location: [place];  Service: Ophthalmology;  Laterality: Right;  C3F8   . Serum patch Right 05/09/2013    Procedure: SERUM PATCH;  Surgeon: Jenny Reichmann  Eber Jones, MD;  Location: [place];  Service: Ophthalmology;  Laterality: Right;  . Laser photo ablation Right 05/09/2013    Procedure: LASER PHOTO ABLATION;  Surgeon: Hayden Pedro, MD;  Location: [place];  Service: Ophthalmology;  Laterality: Right;  Endolaser   . Membrane peel Right 05/09/2013    Procedure: MEMBRANE PEEL;  Surgeon: Hayden Pedro, MD;  Location: [place];  Service: Ophthalmology;  Laterality: Right;  . Icd lead removal N/A 11/09/2013    Procedure: ICD LEAD REMOVAL;  Surgeon: Evans Lance, MD;  Location: [place];  Service: Cardiovascular;  Laterality: N/A;  . Central venous catheter insertion Right 11/09/2013     Procedure: INSERTION CENTRAL LINE ADULT;  Surgeon: Evans Lance, MD;  Location: [place];  Service: Cardiovascular;  Laterality: Right;  . Amputation Left 03/23/2014    Procedure: Left Foot 1st Ray Amputation;  Surgeon: Newt Minion, MD;  Location: [place];  Service: Orthopedics;  Laterality: Left;  Left Foot 1st Ray Amputation  . Toe amputation Left     great toe  . Amputation Left 04/13/2014    Procedure: AMPUTATION BELOW KNEE;  Surgeon: Newt Minion, MD;  Location: [place];  Service: Orthopedics;  Laterality: Left;  Left Below Knee Amputation  . Stump revision Left 05/30/2014    Procedure: Left Below Knee Amputation Revision;  Surgeon: Newt Minion, MD;  Location: [place];  Service: Orthopedics;  Laterality: Left;    Current Outpatient Prescriptions  Medication Sig Dispense Refill  . ALPRAZolam (XANAX) 0.5 MG tablet Take one tablet by mouth twice daily as needed for anxiety    . amLODipine (NORVASC) 10 MG tablet Take 1 tablet (10 mg total) by mouth daily. 30 tablet 6  . aspirin 81 MG tablet Take 81 mg by mouth daily.     . calcitRIOL (ROCALTROL) 0.25 MCG capsule Take one tablet by mouth on Monday, Wednesday, and Friday    . carvedilol (COREG) 25 MG tablet Take 1 tablet (25 mg total) by mouth 2 (two) times daily with a meal. 60 tablet 6  . Cholecalciferol (VITAMIN D-3) 1000 UNITS CAPS Take 1,000 Units by mouth daily.    Marland Kitchen docusate sodium (COLACE) 100 MG capsule Take 100 mg by mouth daily as needed (constipation).     . hydrALAZINE (APRESOLINE) 100 MG tablet Take 1 tablet (100 mg total) by mouth 3 (three) times daily. 270 tablet 3  . insulin glargine (LANTUS) 100 UNIT/ML injection Inject 30 Units into the skin daily.     . insulin lispro (HUMALOG) 100 UNIT/ML injection Inject 0-8 Units into the skin every evening. Sliding scale: 200-249=2 units, 250-299=4 units, 300-349=6 units, >350=8 units    . Multiple Vitamins-Minerals (MULTIVITAMIN WITH MINERALS) tablet Take 1 tablet by mouth daily.    .  nitroGLYCERIN (NITROSTAT) 0.4 MG SL tablet Place 0.4 mg under the tongue every 5 (five) minutes as needed for chest pain.     . polyethylene glycol (MIRALAX / GLYCOLAX) packet Take 17 g by mouth daily as needed (constipation).     . sodium bicarbonate 650 MG tablet Take 650 mg by mouth 3 (three) times daily.    . vitamin C (ASCORBIC ACID) 500 MG tablet Take 500 mg by mouth daily.    . furosemide (LASIX) 80 MG tablet Take 1 tablet (80 mg total) by mouth 2 (two) times daily. 180 tablet 3   No current facility-administered medications for this visit.    Allergies:  Hydrocodone-acetaminophen   Social History:  The patient  reports that she has never smoked. She has never used smokeless tobacco. She reports that she does not drink alcohol or use illicit drugs.   ROS:  Please see the history of present illness. Otherwise, complete review of systems is positive for none.  All other systems are reviewed and negative.   Physical Exam: VS:  BP 140/60 mmHg  Pulse 68  Ht 5\' 1"  (1.549 m)  Wt 174 lb 8 oz (79.153 kg)  BMI 32.99 kg/m2, BMI Body mass index is 32.99 kg/(m^2).  Wt Readings from Last 3 Encounters:  01/30/15 174 lb 8 oz (79.153 kg)  10/16/14 165 lb (74.844 kg)  07/11/14 144 lb (65.318 kg)     Overweight woman, appears comfortable at rest. Using a walker. HEENT: Conjunctiva and lids normal, oropharynx clear.  Neck: No carotid bruis, no thyromegaly.  Lungs: Clear with diminished breath sounds, decreased at bases, nonlabored.  Cardiac: Indistinct PMI, regular rate and rhythm, 2/6 systolic murmur at the base, paradoxically split S2, no S3.  Abdomen: Protuberant.  Extremities: Trace edema on right, status post left BKA with prosthesis in place, diminished distal pulses..  Skin: Warm and dry. Musculoskeletal: No kyphosis. Neuropsychiatric: Alert and oriented 3, affect appropriate.   ECG: ECG is not ordered today.  Recent Labwork: 05/30/2014: ALT 12; AST 24; Hemoglobin 9.2*;  Platelets 398 01/25/2015: BUN 66*; Creatinine 1.94*; Potassium 4.1; Sodium 144   Other Studies Reviewed Today:  Echocardiogram 11/07/2013: Study Conclusions  - Left ventricle: Systolic function is mildly reduced. Estimated EF 45%. Mild global hypokinesis is seen. The cavity size was normal. Wall thickness was increased in a pattern of moderate LVH. There was an increased relative contribution of atrial contraction to ventricular filling. Doppler parameters are consistent with abnormal left ventricular relaxation (grade 1 diastolic dysfunction). - Ventricular septum: Septal motion showed abnormal function and dyssynergy. These changes are consistent with right ventricular pacing. - Aortic valve: Mildly calcified annulus. Mildly thickened, mildly calcified leaflets. There was mild stenosis. Peak velocity: 236cm/s (S). Mean gradient: 35mm Hg (S). - Mitral valve: Mildly thickened leaflets . - Right ventricle: The cavity size was normal. Pacer wire or catheter noted in right ventricle. Systolic function was mildly reduced. - Right atrium: Pacer wire or catheter noted in right atrium. - Tricuspid valve: There is marked acoustic shadowing at the level of the tricuspid valve, possibly due to pacemaker wire. However, a vegetation cannot enitrely be excluded. Mild regurgitation. - Pericardium, extracardiac: Small pericardial effusion. Not hemodynamically significant.   ASSESSMENT AND PLAN:  1. Weight gain, suspect acute on chronic combined heart failure with volume overload. Lasix will be increased to 80 mg twice daily, I reinforced sodium and fluid restriction, also checking daily weights. She should call back within the next few weeks to let us know how she is doing, we will also see her back in the office within a month. May need to add Zaroxolyn temporarily.  2. Essential hypertension, continue hydralazine, Coreg, and Norvasc.  3. Nonischemic  cardiomyopathy, LVEF 45% by assessment last year.  4. Chronic kidney disease, stage 3-4. Last creatinine 1.9.   Current medicines were reviewed at length with the patient today.   Orders Placed This Encounter  Procedures  . Basic Metabolic Panel (BMET)    Disposition: FU with me in 1 month.   Signed, Satira Sark, MD, [facility] 01/30/2015 9:48 AM    [place] [facility] [address] [place],  [address] Phone: [phone]; Fax: [phone]  

## 2015-02-08 ENCOUNTER — Other Ambulatory Visit: Payer: Self-pay | Admitting: *Deleted

## 2015-02-08 ENCOUNTER — Telehealth: Payer: Self-pay | Admitting: Cardiology

## 2015-02-08 MED ORDER — CARVEDILOL 25 MG PO TABS: 25.0000 mg | ORAL_TABLET | Freq: Two times a day (BID) | ORAL | Status: DC

## 2015-02-08 NOTE — Telephone Encounter (Signed)
Would like to discuss with nurse her weight loss and weight gain / tg

## 2015-02-18 NOTE — Telephone Encounter (Signed)
Patient states she just wanted to inform the office that she has lost 5 lb and to check the date of her next appt.

## 2015-03-07 LAB — BASIC METABOLIC PANEL
BUN: 68 mg/dL — ABNORMAL HIGH (ref 6–23)
CHLORIDE: 104 meq/L (ref 96–112)
CO2: 30 meq/L (ref 19–32)
Calcium: 9 mg/dL (ref 8.4–10.5)
Creat: 2.24 mg/dL — ABNORMAL HIGH (ref 0.50–1.10)
GLUCOSE: 88 mg/dL (ref 70–99)
Potassium: 4.1 mEq/L (ref 3.5–5.3)
Sodium: 142 mEq/L (ref 135–145)

## 2015-03-11 ENCOUNTER — Encounter: Payer: Self-pay | Admitting: Cardiology

## 2015-03-11 ENCOUNTER — Ambulatory Visit (INDEPENDENT_AMBULATORY_CARE_PROVIDER_SITE_OTHER): Payer: Medicare Other | Admitting: Cardiology

## 2015-03-11 VITALS — BP 146/60 | HR 60 | Ht 61.0 in | Wt 171.0 lb

## 2015-03-11 DIAGNOSIS — I5022 Chronic systolic (congestive) heart failure: Secondary | ICD-10-CM | POA: Diagnosis not present

## 2015-03-11 DIAGNOSIS — N183 Chronic kidney disease, stage 3 (moderate): Secondary | ICD-10-CM

## 2015-03-11 DIAGNOSIS — I429 Cardiomyopathy, unspecified: Secondary | ICD-10-CM

## 2015-03-11 DIAGNOSIS — I428 Other cardiomyopathies: Secondary | ICD-10-CM

## 2015-03-11 DIAGNOSIS — I2510 Atherosclerotic heart disease of native coronary artery without angina pectoris: Secondary | ICD-10-CM

## 2015-03-11 DIAGNOSIS — I10 Essential (primary) hypertension: Secondary | ICD-10-CM

## 2015-03-11 NOTE — Patient Instructions (Signed)
Your physician recommends that you schedule a follow-up appointment in: 3 months with Dr [name]  Your physician recommends that you continue on your current medications as directed. Please refer to the Current Medication list given to you today.    Lease get lab work  Cytogeneticist before next visit     Thank you for choosing [facility] !

## 2015-03-11 NOTE — Progress Notes (Signed)
Cardiology Office Note  Date: 03/11/2015   ID: [name], DOB Feb 14, 1946, MRN [id number]  PCP: [name], MD  Primary Cardiologist: Rozann Lesches, MD   Chief Complaint  Patient presents with  . Cardiomyopathy    History of Present Illness: [name] is a medically complex 69 y.o. female last seen in April. At the last visit we increased her Lasix. She has lost about 3-4 pounds since that time. We discussed her fluid and sodium intake, still not optimally managed. We again discussed reasonable restrictions, and she states that she will continue to work on this. She will be seeing Dr. Lowanda Foster for nephrology follow-up next week.   Follow-up lab work is noted below, creatinine increased from 1.9-2.2. It has been higher in the past.  She reports NYHA class 2-3 dyspnea, no chest pain.  Past Medical History  Diagnosis Date  . Diabetes mellitus, type 2   . Essential hypertension, benign   . Chronic systolic heart failure   . Coronary atherosclerosis of native coronary artery     Nonobstructive  . Nonischemic cardiomyopathy     LVEF 20% up to 45%  . PAD (peripheral artery disease)     Left foot gangrene  . Anxiety   . Depression   . MRSA bacteremia January 2015    Associated with probable tricuspid valve vegetation, ICD system extracted February 2015  . Anemia     Past Surgical History  Procedure Laterality Date  . Abdominal hysterectomy    . Left shoulder surgery      Rotator Cuff  . Breast lumpectomy      Bil  . Cardiac defibrillator placement      Medtronic [id number]   . Exploratory laparotomy with lysis of adhesions    . Bladder surgery  2011 BENIGN MASS REMOVED FROM BLADDER  . Eye surgery  BILATERAL CATARACTS REMOVAL  10 YEARS AGO PER PATIENT  . Pr vein bypass graft,aorto-fem-pop  05-2011    Left popliteal- PTA graft   . Viterectomy  11/10/2012    OD   Dr Zigmund Daniel  . 25 gauge pars plana vitrectomy with 20 gauge mvr port for macular hole  Right 11/10/2012    Procedure: 71 GAUGE PARS PLANA VITRECTOMY WITH 20 GAUGE MVR PORT FOR MACULAR HOLE;  Surgeon: Hayden Pedro, MD;  Location: [place];  Service: Ophthalmology;  Laterality: Right;  . Gas insertion Right 11/10/2012    Procedure: INSERTION OF GAS;  Surgeon: Hayden Pedro, MD;  Location: [place];  Service: Ophthalmology;  Laterality: Right;  C3F8  . Serum patch Right 11/10/2012    Procedure: SERUM PATCH;  Surgeon: Hayden Pedro, MD;  Location: [place];  Service: Ophthalmology;  Laterality: Right;  . 25 gauge pars plana vitrectomy with 20 gauge mvr port for macular hole Right 05/09/2013    Procedure: 49 GAUGE PARS PLANA VITRECTOMY WITH 20 GAUGE MVR PORT FOR MACULAR HOLE;  Surgeon: Hayden Pedro, MD;  Location: [place];  Service: Ophthalmology;  Laterality: Right;  . Gas/fluid exchange Right 05/09/2013    Procedure: GAS/FLUID EXCHANGE;  Surgeon: Hayden Pedro, MD;  Location: [place];  Service: Ophthalmology;  Laterality: Right;  C3F8   . Serum patch Right 05/09/2013    Procedure: SERUM PATCH;  Surgeon: Hayden Pedro, MD;  Location: [place];  Service: Ophthalmology;  Laterality: Right;  . Laser photo ablation Right 05/09/2013    Procedure: LASER PHOTO ABLATION;  Surgeon: Hayden Pedro, MD;  Location: [facility];  Service: Ophthalmology;  Laterality: Right;  Endolaser   . Membrane peel Right 05/09/2013    Procedure: MEMBRANE PEEL;  Surgeon: Hayden Pedro, MD;  Location: [place];  Service: Ophthalmology;  Laterality: Right;  . Icd lead removal N/A 11/09/2013    Procedure: ICD LEAD REMOVAL;  Surgeon: Evans Lance, MD;  Location: [place];  Service: Cardiovascular;  Laterality: N/A;  . Central venous catheter insertion Right 11/09/2013    Procedure: INSERTION CENTRAL LINE ADULT;  Surgeon: Evans Lance, MD;  Location: [place];  Service: Cardiovascular;  Laterality: Right;  . Amputation Left 03/23/2014    Procedure: Left Foot 1st Ray Amputation;  Surgeon: Newt Minion, MD;  Location: [place];  Service:  Orthopedics;  Laterality: Left;  Left Foot 1st Ray Amputation  . Toe amputation Left     great toe  . Amputation Left 04/13/2014    Procedure: AMPUTATION BELOW KNEE;  Surgeon: Newt Minion, MD;  Location: [place];  Service: Orthopedics;  Laterality: Left;  Left Below Knee Amputation  . Stump revision Left 05/30/2014    Procedure: Left Below Knee Amputation Revision;  Surgeon: Newt Minion, MD;  Location: [place];  Service: Orthopedics;  Laterality: Left;    Current Outpatient Prescriptions  Medication Sig Dispense Refill  . ALPRAZolam (XANAX) 0.5 MG tablet Take one tablet by mouth twice daily as needed for anxiety    . amLODipine (NORVASC) 10 MG tablet Take 1 tablet (10 mg total) by mouth daily. 30 tablet 6  . aspirin 81 MG tablet Take 81 mg by mouth daily.     . calcitRIOL (ROCALTROL) 0.25 MCG capsule Take one tablet by mouth on Monday, Wednesday, and Friday    . carvedilol (COREG) 25 MG tablet Take 1 tablet (25 mg total) by mouth 2 (two) times daily with a meal. 60 tablet 6  . Cholecalciferol (VITAMIN D-3) 1000 UNITS CAPS Take 1,000 Units by mouth daily.    Marland Kitchen docusate sodium (COLACE) 100 MG capsule Take 100 mg by mouth daily as needed (constipation).     . furosemide (LASIX) 80 MG tablet Take 1 tablet (80 mg total) by mouth 2 (two) times daily. 180 tablet 3  . hydrALAZINE (APRESOLINE) 100 MG tablet Take 1 tablet (100 mg total) by mouth 3 (three) times daily. 270 tablet 3  . insulin glargine (LANTUS) 100 UNIT/ML injection Inject 30 Units into the skin daily.     . insulin lispro (HUMALOG) 100 UNIT/ML injection Inject 0-8 Units into the skin every evening. Sliding scale: 200-249=2 units, 250-299=4 units, 300-349=6 units, >350=8 units    . Multiple Vitamins-Minerals (MULTIVITAMIN WITH MINERALS) tablet Take 1 tablet by mouth daily.    . nitroGLYCERIN (NITROSTAT) 0.4 MG SL tablet Place 0.4 mg under the tongue every 5 (five) minutes as needed for chest pain.     . polyethylene glycol (MIRALAX /  GLYCOLAX) packet Take 17 g by mouth daily as needed (constipation).     . sodium bicarbonate 650 MG tablet Take 650 mg by mouth 3 (three) times daily.    . vitamin C (ASCORBIC ACID) 500 MG tablet Take 500 mg by mouth daily.     No current facility-administered medications for this visit.    Allergies:  Hydrocodone-acetaminophen   Social History: The patient  reports that she has never smoked. She has never used smokeless tobacco. She reports that she does not drink alcohol or use illicit drugs.   ROS:  Please see the history of  present illness. Otherwise, complete review of systems is positive for stable appetite, no orthopnea or PND.  All other systems are reviewed and negative.   Physical Exam: VS:  BP 146/60 mmHg  Pulse 60  Ht 5\' 1"  (1.549 m)  Wt 171 lb (77.565 kg)  BMI 32.33 kg/m2  SpO2 96%, BMI Body mass index is 32.33 kg/(m^2).  Wt Readings from Last 3 Encounters:  03/11/15 171 lb (77.565 kg)  01/30/15 174 lb 8 oz (79.153 kg)  10/16/14 165 lb (74.844 kg)     Overweight woman, appears comfortable at rest. Using a walker. HEENT: Conjunctiva and lids normal, oropharynx clear.  Neck: No carotid bruis, no thyromegaly.  Lungs: Clear with diminished breath sounds, decreased at bases, nonlabored.  Cardiac: Indistinct PMI, regular rate and rhythm, 2/6 systolic murmur at the base, paradoxically split S2, no S3.  Abdomen: Protuberant.  Extremities: 1+ edema on right, status post left BKA with prosthesis in place, diminished distal pulses..  Skin: Warm and dry. Musculoskeletal: No kyphosis. Neuropsychiatric: Alert and oriented 3, affect appropriate.   ECG: ECG is not ordered today.  Recent Labwork: 05/30/2014: ALT 12; AST 24; Hemoglobin 9.2*; Platelets 398 03/06/2015: BUN 68*; Creatinine 2.24*; Potassium 4.1; Sodium 142  02/14/2015: Potassium 4.0, BUN 71, creatinine 2.3, cholesterol 203, triglycerides 52, HDL 75, LDL 118, AST 17, ALT 9. Hgb A1C 6.5.  Other Studies Reviewed  Today:  Echocardiogram 11/07/2013: Study Conclusions  - Left ventricle: Systolic function is mildly reduced. Estimated EF 45%. Mild global hypokinesis is seen. The cavity size was normal. Wall thickness was increased in a pattern of moderate LVH. There was an increased relative contribution of atrial contraction to ventricular filling. Doppler parameters are consistent with abnormal left ventricular relaxation (grade 1 diastolic dysfunction). - Ventricular septum: Septal motion showed abnormal function and dyssynergy. These changes are consistent with right ventricular pacing. - Aortic valve: Mildly calcified annulus. Mildly thickened, mildly calcified leaflets. There was mild stenosis. Peak velocity: 236cm/s (S). Mean gradient: 66mm Hg (S). - Mitral valve: Mildly thickened leaflets . - Right ventricle: The cavity size was normal. Pacer wire or catheter noted in right ventricle. Systolic function was mildly reduced. - Right atrium: Pacer wire or catheter noted in right atrium. - Tricuspid valve: There is marked acoustic shadowing at the level of the tricuspid valve, possibly due to pacemaker wire. However, a vegetation cannot enitrely be excluded. Mild regurgitation. - Pericardium, extracardiac: Small pericardial effusion. Not hemodynamically significant.   ASSESSMENT AND PLAN:  1. Chronic systolic heart failure, weight is coming down some, but she is still not optimal in terms of volume status. A lot of this seems to be related to dietary indiscretions, and we discussed reasonable fluid and sodium intake guidelines again. Reluctant to advance Lasix further now, but we could add PRN Zaroxolyn as a next step, or potentially switch to Demadex. No changes were made today.  2. Nonischemic cardiomyopathy, most recent LVEF 45% with grade 1 diastolic dysfunction.  3. History of nonobstructive CAD without active angina symptoms.  4. Essential  hypertension, no change made antihypertensives today.  Current medicines were reviewed at length with the patient today.   Orders Placed This Encounter  Procedures  . Basic Metabolic Panel (BMET)    Disposition: FU with me in 3 months.   Signed, Satira Sark, MD, [facility] 03/11/2015 8:59 AM    [place] at [facility] [address], [place], [place] [address] Phone: [phone]; Fax: [phone]

## 2015-05-14 ENCOUNTER — Encounter: Payer: Self-pay | Admitting: Cardiology

## 2015-06-11 ENCOUNTER — Ambulatory Visit: Payer: Medicare Other | Admitting: Cardiology

## 2015-06-21 LAB — BASIC METABOLIC PANEL
BUN: 66 mg/dL — ABNORMAL HIGH (ref 7–25)
CALCIUM: 9.2 mg/dL (ref 8.6–10.4)
CHLORIDE: 102 mmol/L (ref 98–110)
CO2: 25 mmol/L (ref 20–31)
Creat: 2.54 mg/dL — ABNORMAL HIGH (ref 0.50–0.99)
Glucose, Bld: 203 mg/dL — ABNORMAL HIGH (ref 65–99)
POTASSIUM: 4 mmol/L (ref 3.5–5.3)
SODIUM: 140 mmol/L (ref 135–146)

## 2015-06-24 ENCOUNTER — Ambulatory Visit (INDEPENDENT_AMBULATORY_CARE_PROVIDER_SITE_OTHER): Payer: Medicare Other | Admitting: Cardiology

## 2015-06-24 ENCOUNTER — Encounter: Payer: Self-pay | Admitting: Cardiology

## 2015-06-24 VITALS — BP 138/74 | HR 65 | Ht 61.0 in | Wt 178.2 lb

## 2015-06-24 DIAGNOSIS — I429 Cardiomyopathy, unspecified: Secondary | ICD-10-CM | POA: Diagnosis not present

## 2015-06-24 DIAGNOSIS — I2510 Atherosclerotic heart disease of native coronary artery without angina pectoris: Secondary | ICD-10-CM

## 2015-06-24 DIAGNOSIS — I10 Essential (primary) hypertension: Secondary | ICD-10-CM

## 2015-06-24 DIAGNOSIS — I428 Other cardiomyopathies: Secondary | ICD-10-CM

## 2015-06-24 DIAGNOSIS — I5042 Chronic combined systolic (congestive) and diastolic (congestive) heart failure: Secondary | ICD-10-CM

## 2015-06-24 NOTE — Patient Instructions (Signed)
Your physician recommends that you schedule a follow-up appointment in: 3 months with Dr [name]   Your physician recommends that you continue on your current medications as directed. Please refer to the Current Medication list given to you today.     Please have lab work done just BEFORE next visit      Thank you for choosing [facility] !

## 2015-06-24 NOTE — Progress Notes (Signed)
Cardiology Office Note  Date: 06/24/2015   ID: [name], DOB 26-Dec-1945, MRN [id number]  PCP: [name], MD  Primary Cardiologist: Rozann Lesches, MD   Chief Complaint  Patient presents with  . Cardiomyopathy    History of Present Illness: [name] is a medically complex 69 y.o. female last seen in June. She presents for a routine follow-up visit. She tells me that her weight is around 170 to 171 pounds at home without her prosthesis on her left leg. She does not report any progressing shortness of breath or chest pain, we discussed her fluid and salt intake.  Medications were reviewed. She continues on Lasix 80 mg twice daily. Weight is creeping up by our scales.  Follow-up lab work is noted below, creatinine up to 2.5.   Past Medical History  Diagnosis Date  . Diabetes mellitus, type 2   . Essential hypertension, benign   . Chronic systolic heart failure   . Coronary atherosclerosis of native coronary artery     Nonobstructive  . Nonischemic cardiomyopathy     LVEF 20% up to 45%  . PAD (peripheral artery disease)     Left foot gangrene  . Anxiety   . Depression   . MRSA bacteremia January 2015    Associated with probable tricuspid valve vegetation, ICD system extracted February 2015  . Anemia     Current Outpatient Prescriptions  Medication Sig Dispense Refill  . ALPRAZolam (XANAX) 0.5 MG tablet Take one tablet by mouth twice daily as needed for anxiety    . amLODipine (NORVASC) 10 MG tablet Take 1 tablet (10 mg total) by mouth daily. 30 tablet 6  . aspirin 81 MG tablet Take 81 mg by mouth daily.     . calcitRIOL (ROCALTROL) 0.25 MCG capsule Take one tablet by mouth on Monday, Wednesday, and Friday    . carvedilol (COREG) 25 MG tablet Take 1 tablet (25 mg total) by mouth 2 (two) times daily with a meal. 60 tablet 6  . Cholecalciferol (VITAMIN D-3) 1000 UNITS CAPS Take 1,000 Units by mouth daily.    Marland Kitchen docusate sodium (COLACE) 100 MG  capsule Take 100 mg by mouth daily as needed (constipation).     . furosemide (LASIX) 80 MG tablet Take 1 tablet (80 mg total) by mouth 2 (two) times daily. 180 tablet 3  . hydrALAZINE (APRESOLINE) 100 MG tablet Take 1 tablet (100 mg total) by mouth 3 (three) times daily. 270 tablet 3  . insulin glargine (LANTUS) 100 UNIT/ML injection Inject 30 Units into the skin daily.     . insulin lispro (HUMALOG) 100 UNIT/ML injection Inject 0-8 Units into the skin every evening. Sliding scale: 200-249=2 units, 250-299=4 units, 300-349=6 units, >350=8 units    . Multiple Vitamins-Minerals (MULTIVITAMIN WITH MINERALS) tablet Take 1 tablet by mouth daily.    . nitroGLYCERIN (NITROSTAT) 0.4 MG SL tablet Place 0.4 mg under the tongue every 5 (five) minutes as needed for chest pain.     . polyethylene glycol (MIRALAX / GLYCOLAX) packet Take 17 g by mouth daily as needed (constipation).     . sodium bicarbonate 650 MG tablet Take 650 mg by mouth 3 (three) times daily.    . vitamin C (ASCORBIC ACID) 500 MG tablet Take 500 mg by mouth daily.     No current facility-administered medications for this visit.    Allergies:  Hydrocodone-acetaminophen   Social History: The patient  reports that she has never smoked. She has  never used smokeless tobacco. She reports that she does not drink alcohol or use illicit drugs.   ROS:  Please see the history of present illness. Otherwise, complete review of systems is positive for none.  All other systems are reviewed and negative.   Physical Exam: VS:  BP 138/74 mmHg  Pulse 65  Ht 5\' 1"  (1.549 m)  Wt 178 lb 3.2 oz (80.831 kg)  BMI 33.69 kg/m2  SpO2 93%, BMI Body mass index is 33.69 kg/(m^2).  Wt Readings from Last 3 Encounters:  06/24/15 178 lb 3.2 oz (80.831 kg)  03/11/15 171 lb (77.565 kg)  01/30/15 174 lb 8 oz (79.153 kg)     Overweight woman, appears comfortable at rest. Using a walker. HEENT: Conjunctiva and lids normal, oropharynx clear.  Neck: No carotid  bruis, no thyromegaly.  Lungs: Clear with diminished breath sounds, decreased at bases, nonlabored.  Cardiac: Indistinct PMI, regular rate and rhythm, 2/6 systolic murmur at the base, paradoxically split S2, no S3.  Abdomen: Protuberant.  Extremities: 1+ edema on right, status post left BKA with prosthesis in place, diminished distal pulses..   ECG: ECG is not ordered today.  Recent Labwork: 06/20/2015: BUN 66*; Creat 2.54*; Potassium 4.0; Sodium 140   Other Studies Reviewed Today:  Echocardiogram 11/07/2013: Study Conclusions  - Left ventricle: Systolic function is mildly reduced. Estimated EF 45%. Mild global hypokinesis is seen. The cavity size was normal. Wall thickness was increased in a pattern of moderate LVH. There was an increased relative contribution of atrial contraction to ventricular filling. Doppler parameters are consistent with abnormal left ventricular relaxation (grade 1 diastolic dysfunction). - Ventricular septum: Septal motion showed abnormal function and dyssynergy. These changes are consistent with right ventricular pacing. - Aortic valve: Mildly calcified annulus. Mildly thickened, mildly calcified leaflets. There was mild stenosis. Peak velocity: 236cm/s (S). Mean gradient: 18mm Hg (S). - Mitral valve: Mildly thickened leaflets . - Right ventricle: The cavity size was normal. Pacer wire or catheter noted in right ventricle. Systolic function was mildly reduced. - Right atrium: Pacer wire or catheter noted in right atrium. - Tricuspid valve: There is marked acoustic shadowing at the level of the tricuspid valve, possibly due to pacemaker wire. However, a vegetation cannot enitrely be excluded. Mild regurgitation. - Pericardium, extracardiac: Small pericardial effusion. Not hemodynamically significant.  ASSESSMENT AND PLAN:  1. Chronic combined heart failure. She has been symptomatically stable. I am concerned  that her weight is creeping back up. We discussed fluid and sodium restriction again today, will stay on high-dose Lasix for now. Next step will be switching to [facility] most likely, depending on renal function.  2. Nonischemic cardiomyopathy with LVEF 45% by last evaluation.  3. Essential hypertension, no changes made to antihypertensives.  Current medicines were reviewed at length with the patient today.   Orders Placed This Encounter  Procedures  . Basic Metabolic Panel (BMET)    Disposition: FU with me in 3 months.   Signed, Satira Sark, MD, [facility] 06/24/2015 9:06 AM    [place] at [facility] [address] [place], [place] [address] Phone: [phone]; Fax: [phone]

## 2015-06-27 ENCOUNTER — Ambulatory Visit (INDEPENDENT_AMBULATORY_CARE_PROVIDER_SITE_OTHER): Payer: Medicare Other | Admitting: Ophthalmology

## 2015-06-27 DIAGNOSIS — H35033 Hypertensive retinopathy, bilateral: Secondary | ICD-10-CM | POA: Diagnosis not present

## 2015-06-27 DIAGNOSIS — E11319 Type 2 diabetes mellitus with unspecified diabetic retinopathy without macular edema: Secondary | ICD-10-CM | POA: Diagnosis not present

## 2015-06-27 DIAGNOSIS — H43812 Vitreous degeneration, left eye: Secondary | ICD-10-CM

## 2015-06-27 DIAGNOSIS — H35341 Macular cyst, hole, or pseudohole, right eye: Secondary | ICD-10-CM | POA: Diagnosis not present

## 2015-06-27 DIAGNOSIS — I10 Essential (primary) hypertension: Secondary | ICD-10-CM | POA: Diagnosis not present

## 2015-06-27 DIAGNOSIS — E11359 Type 2 diabetes mellitus with proliferative diabetic retinopathy without macular edema: Secondary | ICD-10-CM

## 2015-09-06 LAB — BASIC METABOLIC PANEL
BUN: 67 mg/dL — ABNORMAL HIGH (ref 7–25)
CO2: 24 mmol/L (ref 20–31)
CREATININE: 2.39 mg/dL — AB (ref 0.50–0.99)
Calcium: 9.2 mg/dL (ref 8.6–10.4)
Chloride: 105 mmol/L (ref 98–110)
Glucose, Bld: 143 mg/dL — ABNORMAL HIGH (ref 65–99)
POTASSIUM: 3.8 mmol/L (ref 3.5–5.3)
Sodium: 143 mmol/L (ref 135–146)

## 2015-09-09 ENCOUNTER — Encounter: Payer: Self-pay | Admitting: Cardiology

## 2015-09-09 ENCOUNTER — Ambulatory Visit (INDEPENDENT_AMBULATORY_CARE_PROVIDER_SITE_OTHER): Payer: Medicare Other | Admitting: Cardiology

## 2015-09-09 VITALS — BP 128/68 | HR 59 | Ht 61.0 in | Wt 182.8 lb

## 2015-09-09 DIAGNOSIS — I2510 Atherosclerotic heart disease of native coronary artery without angina pectoris: Secondary | ICD-10-CM

## 2015-09-09 DIAGNOSIS — N183 Chronic kidney disease, stage 3 (moderate): Secondary | ICD-10-CM | POA: Diagnosis not present

## 2015-09-09 DIAGNOSIS — I5022 Chronic systolic (congestive) heart failure: Secondary | ICD-10-CM | POA: Diagnosis not present

## 2015-09-09 DIAGNOSIS — I10 Essential (primary) hypertension: Secondary | ICD-10-CM | POA: Diagnosis not present

## 2015-09-09 DIAGNOSIS — I429 Cardiomyopathy, unspecified: Secondary | ICD-10-CM

## 2015-09-09 DIAGNOSIS — I428 Other cardiomyopathies: Secondary | ICD-10-CM

## 2015-09-09 MED ORDER — TORSEMIDE 20 MG PO TABS: 60.0000 mg | ORAL_TABLET | Freq: Two times a day (BID) | ORAL | Status: DC

## 2015-09-09 NOTE — Patient Instructions (Signed)
Your physician recommends that you schedule a follow-up appointment in:  1 month with Dr [name]   STOP Lasix   START Demedax 60 mg twice a day   Your physician recommends that you return for lab work in: next week BMET   If you need a refill on your cardiac medications before your next appointment, please call your pharmacy.    Thank you for choosing [facility] !

## 2015-09-09 NOTE — Progress Notes (Signed)
Cardiology Office Note  Date: 09/09/2015   ID: [name], DOB 28-May-1946, MRN [id number]  PCP: [name], MD  Primary Cardiologist: Rozann Lesches, MD   Chief Complaint  Patient presents with  . Cardiomyopathy    History of Present Illness: [name] is a medically complex 69 y.o. female last seen in September. She presents for a follow-up visit. Her weight continues to climb very gradually, now in the low 180s. She states that she felt the best when she was down in the 160s. It sounds like her weight gain has been slowly contributed to by indiscretion in fluid intake. She reports compliance with her diuretics.  Recent lab work is reviewed below, creatinine was down to 2.3. This has been relatively stable. We did discuss changing her Lasix to Demadex to see if this might provide better fluid control.  Echocardiogram from last year showed LVEF 45%. She has a history of nonischemic cardiomyopathy and previously had undergone ICD placement, however device ultimately had to be extracted due to endocarditis.   Past Medical History  Diagnosis Date  . Diabetes mellitus, type 2 ([place])   . Essential hypertension, benign   . Chronic systolic heart failure ([place])   . Coronary atherosclerosis of native coronary artery     Nonobstructive  . Nonischemic cardiomyopathy (HCC)     LVEF 20% up to 45%  . PAD (peripheral artery disease) (HCC)     Left foot gangrene  . Anxiety   . Depression   . MRSA bacteremia January 2015    Associated with probable tricuspid valve vegetation, ICD system extracted February 2015  . Anemia     Current Outpatient Prescriptions  Medication Sig Dispense Refill  . ALPRAZolam (XANAX) 0.5 MG tablet Take one tablet by mouth twice daily as needed for anxiety    . amLODipine (NORVASC) 10 MG tablet Take 1 tablet (10 mg total) by mouth daily. 30 tablet 6  . aspirin 81 MG tablet Take 81 mg by mouth daily.     . calcitRIOL (ROCALTROL) 0.25 MCG  capsule Take one tablet by mouth on Monday, Wednesday, and Friday    . carvedilol (COREG) 25 MG tablet Take 1 tablet (25 mg total) by mouth 2 (two) times daily with a meal. 60 tablet 6  . Cholecalciferol (VITAMIN D-3) 1000 UNITS CAPS Take 1,000 Units by mouth daily.    Marland Kitchen docusate sodium (COLACE) 100 MG capsule Take 100 mg by mouth daily as needed (constipation).     . hydrALAZINE (APRESOLINE) 100 MG tablet Take 1 tablet (100 mg total) by mouth 3 (three) times daily. 270 tablet 3  . insulin glargine (LANTUS) 100 UNIT/ML injection Inject 30 Units into the skin daily.     . insulin lispro (HUMALOG) 100 UNIT/ML injection Inject 0-8 Units into the skin every evening. Sliding scale: 200-249=2 units, 250-299=4 units, 300-349=6 units, >350=8 units    . Multiple Vitamins-Minerals (MULTIVITAMIN WITH MINERALS) tablet Take 1 tablet by mouth daily.    . nitroGLYCERIN (NITROSTAT) 0.4 MG SL tablet Place 0.4 mg under the tongue every 5 (five) minutes as needed for chest pain.     . polyethylene glycol (MIRALAX / GLYCOLAX) packet Take 17 g by mouth daily as needed (constipation).     . sodium bicarbonate 650 MG tablet Take 650 mg by mouth 3 (three) times daily.    . vitamin C (ASCORBIC ACID) 500 MG tablet Take 500 mg by mouth daily.    Marland Kitchen torsemide (DEMADEX) 20 MG  tablet Take 3 tablets (60 mg total) by mouth 2 (two) times daily. 180 tablet 11   No current facility-administered medications for this visit.    Allergies:  Hydrocodone-acetaminophen   Social History: The patient  reports that she has never smoked. She has never used smokeless tobacco. She reports that she does not drink alcohol or use illicit drugs.   ROS:  Please see the history of present illness. Otherwise, complete review of systems is positive for NYHA class 2-3 shortness of breath.  All other systems are reviewed and negative.   Physical Exam: VS:  BP 128/68 mmHg  Pulse 59  Ht 5\' 1"  (1.549 m)  Wt 182 lb 12.8 oz (82.918 kg)  BMI 34.56  kg/m2  SpO2 91%, BMI Body mass index is 34.56 kg/(m^2).  Wt Readings from Last 3 Encounters:  09/09/15 182 lb 12.8 oz (82.918 kg)  06/24/15 178 lb 3.2 oz (80.831 kg)  03/11/15 171 lb (77.565 kg)    Overweight woman, appears comfortable at rest. Using a walker. HEENT: Conjunctiva and lids normal, oropharynx clear.  Neck: No carotid bruis, no thyromegaly.  Lungs: Clear with diminished breath sounds, decreased at bases, nonlabored.  Cardiac: Indistinct PMI, regular rate and rhythm, 2/6 systolic murmur at the base, paradoxically split S2, no S3.  Abdomen: Protuberant.  Extremities: 1+ edema on right, status post left BKA with prosthesis in place, diminished distal pulses.  ECG: ECG is ordered today and shows sinus rhythm with left bundle branch block and PVC.  Recent Labwork: 09/05/2015: BUN 67*; Creat 2.39*; Potassium 3.8; Sodium 143   Other Studies Reviewed Today:  Echocardiogram 11/07/2013: Study Conclusions  - Left ventricle: Systolic function is mildly reduced. Estimated EF 45%. Mild global hypokinesis is seen. The cavity size was normal. Wall thickness was increased in a pattern of moderate LVH. There was an increased relative contribution of atrial contraction to ventricular filling. Doppler parameters are consistent with abnormal left ventricular relaxation (grade 1 diastolic dysfunction). - Ventricular septum: Septal motion showed abnormal function and dyssynergy. These changes are consistent with right ventricular pacing. - Aortic valve: Mildly calcified annulus. Mildly thickened, mildly calcified leaflets. There was mild stenosis. Peak velocity: 236cm/s (S). Mean gradient: 65mm Hg (S). - Mitral valve: Mildly thickened leaflets . - Right ventricle: The cavity size was normal. Pacer wire or catheter noted in right ventricle. Systolic function was mildly reduced. - Right atrium: Pacer wire or catheter noted in right atrium. - Tricuspid  valve: There is marked acoustic shadowing at the level of the tricuspid valve, possibly due to pacemaker wire. However, a vegetation cannot enitrely be excluded. Mild regurgitation. - Pericardium, extracardiac: Small pericardial effusion. Not hemodynamically significant.  ASSESSMENT AND PLAN:  1. Nonischemic cardiomyopathy with LVEF approximately 45% and grade 1 diastolic dysfunction based on previous testing. She has had a slow but steady weight gain with NYHA class 2-3 dyspnea on exertion. Plan is to switch from Lasix to Demadex at 60 mg twice daily. Follow-up BME T next week. Clinical visit a month.  2. History of nonobstructive CAD without active angina symptoms.  3. Chronic kidney disease, stage 3, recent creatinine 2.3.  Current medicines were reviewed at length with the patient today.   Orders Placed This Encounter  Procedures  . Basic Metabolic Panel (BMET)  . EKG 12-Lead    Disposition: FU with me in 1 month.   Signed, Satira Sark, MD, [facility] 09/09/2015 9:46 AM    [facility] at [place].  [address], [place], [place] [address] Phone: [phone]; Fax: [phone]

## 2015-09-17 LAB — BASIC METABOLIC PANEL
BUN: 46 mg/dL — ABNORMAL HIGH (ref 7–25)
CALCIUM: 8.7 mg/dL (ref 8.6–10.4)
CO2: 17 mmol/L — ABNORMAL LOW (ref 20–31)
CREATININE: 1.97 mg/dL — AB (ref 0.50–0.99)
Chloride: 106 mmol/L (ref 98–110)
GLUCOSE: 219 mg/dL — AB (ref 65–99)
Potassium: 4.6 mmol/L (ref 3.5–5.3)
SODIUM: 139 mmol/L (ref 135–146)

## 2015-10-10 ENCOUNTER — Ambulatory Visit (INDEPENDENT_AMBULATORY_CARE_PROVIDER_SITE_OTHER): Payer: Medicare Other | Admitting: Cardiology

## 2015-10-10 ENCOUNTER — Encounter: Payer: Self-pay | Admitting: Cardiology

## 2015-10-10 VITALS — BP 150/62 | HR 67 | Ht 61.0 in | Wt 182.0 lb

## 2015-10-10 DIAGNOSIS — N183 Chronic kidney disease, stage 3 (moderate): Secondary | ICD-10-CM

## 2015-10-10 DIAGNOSIS — I429 Cardiomyopathy, unspecified: Secondary | ICD-10-CM | POA: Diagnosis not present

## 2015-10-10 DIAGNOSIS — I5042 Chronic combined systolic (congestive) and diastolic (congestive) heart failure: Secondary | ICD-10-CM | POA: Diagnosis not present

## 2015-10-10 DIAGNOSIS — I428 Other cardiomyopathies: Secondary | ICD-10-CM

## 2015-10-10 MED ORDER — TORSEMIDE 20 MG PO TABS: 240.0000 mg | ORAL_TABLET | Freq: Every day | ORAL | Status: DC

## 2015-10-10 MED ORDER — TORSEMIDE 20 MG PO TABS: 80.0000 mg | ORAL_TABLET | Freq: Two times a day (BID) | ORAL | Status: DC

## 2015-10-10 NOTE — Patient Instructions (Addendum)
Your physician recommends that you schedule a follow-up appointment in: 6 weeks with Dr [name]    INCREASE Demedex to 80 mg twice a day    If you need a refill on your cardiac medications before your next appointment, please call your pharmacy.      Thank you for choosing [facility] !

## 2015-10-10 NOTE — Progress Notes (Signed)
Cardiology Office Note  Date: 10/10/2015   ID: [name], DOB 1945-11-10, MRN [id number]  PCP: [name], MD  Primary Cardiologist: Rozann Lesches, MD   Chief Complaint  Patient presents with  . Cardiomyopathy    History of Present Illness: [name] is a medically complex 70 y.o. female last seen in December 2016. At the last visit we switched from Lasix to Demadex at 60 mg twice daily. Since that time she has tolerated the medication, but her weight has not changed very much at all. She does not report any worsening shortness of breath, has mild right leg edema. No orthopnea or PND.  Follow-up lab work from December 2016 is outlined below showing an improvement in creatinine from 2.3 down to 1.9 and normal potassium.  She has a visit scheduled with her nephrologist in the next few weeks.  Past Medical History  Diagnosis Date  . Diabetes mellitus, type 2 ([place])   . Essential hypertension, benign   . Chronic systolic heart failure ([place])   . Coronary atherosclerosis of native coronary artery     Nonobstructive  . Nonischemic cardiomyopathy (HCC)     LVEF 20% up to 45%  . PAD (peripheral artery disease) (HCC)     Left foot gangrene  . Anxiety   . Depression   . MRSA bacteremia January 2015    Associated with probable tricuspid valve vegetation, ICD system extracted February 2015  . Anemia     Current Outpatient Prescriptions  Medication Sig Dispense Refill  . ALPRAZolam (XANAX) 0.5 MG tablet Take one tablet by mouth twice daily as needed for anxiety    . amLODipine (NORVASC) 10 MG tablet Take 1 tablet (10 mg total) by mouth daily. 30 tablet 6  . aspirin 81 MG tablet Take 81 mg by mouth daily.     . calcitRIOL (ROCALTROL) 0.25 MCG capsule Take one tablet by mouth on Monday, Wednesday, and Friday    . carvedilol (COREG) 25 MG tablet Take 1 tablet (25 mg total) by mouth 2 (two) times daily with a meal. 60 tablet 6  . Cholecalciferol (VITAMIN D-3) 1000  UNITS CAPS Take 1,000 Units by mouth daily.    Marland Kitchen docusate sodium (COLACE) 100 MG capsule Take 100 mg by mouth daily as needed (constipation).     . hydrALAZINE (APRESOLINE) 100 MG tablet Take 1 tablet (100 mg total) by mouth 3 (three) times daily. 270 tablet 3  . insulin glargine (LANTUS) 100 UNIT/ML injection Inject 30 Units into the skin daily.     . insulin lispro (HUMALOG) 100 UNIT/ML injection Inject 0-8 Units into the skin every evening. Sliding scale: 200-249=2 units, 250-299=4 units, 300-349=6 units, >350=8 units    . Multiple Vitamins-Minerals (MULTIVITAMIN WITH MINERALS) tablet Take 1 tablet by mouth daily.    . nitroGLYCERIN (NITROSTAT) 0.4 MG SL tablet Place 0.4 mg under the tongue every 5 (five) minutes as needed for chest pain.     . polyethylene glycol (MIRALAX / GLYCOLAX) packet Take 17 g by mouth daily as needed (constipation).     . sodium bicarbonate 650 MG tablet Take 650 mg by mouth 3 (three) times daily.    . vitamin C (ASCORBIC ACID) 500 MG tablet Take 500 mg by mouth daily.     No current facility-administered medications for this visit.   Allergies:  Hydrocodone-acetaminophen   Social History: The patient  reports that she has never smoked. She has never used smokeless tobacco. She reports that she  does not drink alcohol or use illicit drugs.    ROS:  Please see the history of present illness. Otherwise, complete review of systems is positive for NYHA class 2-3 dyspnea but depending on level of activity.  All other systems are reviewed and negative.   Physical Exam: VS:  BP 150/62 mmHg  Pulse 67  Ht 5\' 1"  (1.549 m)  Wt 182 lb (82.555 kg)  BMI 34.41 kg/m2  SpO2 96%, BMI Body mass index is 34.41 kg/(m^2).  Wt Readings from Last 3 Encounters:  10/10/15 182 lb (82.555 kg)  09/09/15 182 lb 12.8 oz (82.918 kg)  06/24/15 178 lb 3.2 oz (80.831 kg)    Overweight woman, appears comfortable at rest. Using a walker. HEENT: Conjunctiva and lids normal, oropharynx clear.   Neck: No carotid bruis, no thyromegaly.  Lungs: Clear with diminished breath sounds, decreased at bases, nonlabored.  Cardiac: Indistinct PMI, regular rate and rhythm, 2/6 systolic murmur at the base, paradoxically split S2, no S3.  Abdomen: Protuberant.  Extremities: 1+ edema on right, status post left BKA with prosthesis in place, diminished distal pulses.  ECG: ECG is not ordered today.  Recent Labwork: 09/16/2015: BUN 46*; Creat 1.97*; Potassium 4.6; Sodium 139     Component Value Date/Time   CHOL 185 01/17/2010 2004   TRIG 51 01/17/2010 2004   HDL 67 01/17/2010 2004   CHOLHDL 2.8 Ratio 01/17/2010 2004   VLDL 10 01/17/2010 2004   LDLCALC 108* 01/17/2010 2004    Other Studies Reviewed Today:  Echocardiogram 11/07/2013: Study Conclusions  - Left ventricle: Systolic function is mildly reduced. Estimated EF 45%. Mild global hypokinesis is seen. The cavity size was normal. Wall thickness was increased in a pattern of moderate LVH. There was an increased relative contribution of atrial contraction to ventricular filling. Doppler parameters are consistent with abnormal left ventricular relaxation (grade 1 diastolic dysfunction). - Ventricular septum: Septal motion showed abnormal function and dyssynergy. These changes are consistent with right ventricular pacing. - Aortic valve: Mildly calcified annulus. Mildly thickened, mildly calcified leaflets. There was mild stenosis. Peak velocity: 236cm/s (S). Mean gradient: 67mm Hg (S). - Mitral valve: Mildly thickened leaflets . - Right ventricle: The cavity size was normal. Pacer wire or catheter noted in right ventricle. Systolic function was mildly reduced. - Right atrium: Pacer wire or catheter noted in right atrium. - Tricuspid valve: There is marked acoustic shadowing at the level of the tricuspid valve, possibly due to pacemaker wire. However, a vegetation cannot enitrely be  excluded. Mild regurgitation. - Pericardium, extracardiac: Small pericardial effusion. Not hemodynamically significant.  Assessment and Plan:  1. Chronic combined heart failure. Increase Demadex to 80 mg twice daily for now, continue to track daily weight, generally she had done better when she was in the 160s to 170s. She does state that she has been eating better and some of this weight gain could be caloric. Her last creatinine actually looked better, follow-up BMET with her nephrologist as scheduled in the next few weeks.  2. Nonischemic cardiomyopathy with LVEF approximately 45% and grade 1 diastolic dysfunction. She has a previous history of more substantial LV dysfunction and had undergone ICD placement in the past, however device ultimately had to be extracted due to endocarditis.  3. CKD, stage 3. Most recent creatinine 1.9.  Current medicines were reviewed with the patient today.  Disposition: FU with me in 6 weeks.   Signed, Satira Sark, MD, [facility] 10/10/2015 8:24 AM    [facility] [place]. [address], [place], [place] [address] Phone: [phone]; Fax: [phone]

## 2015-10-16 ENCOUNTER — Other Ambulatory Visit: Payer: Self-pay | Admitting: Cardiology

## 2015-11-22 ENCOUNTER — Ambulatory Visit: Payer: Medicare Other | Admitting: Cardiology

## 2015-11-27 ENCOUNTER — Ambulatory Visit (INDEPENDENT_AMBULATORY_CARE_PROVIDER_SITE_OTHER): Payer: Medicare Other | Admitting: Cardiology

## 2015-11-27 ENCOUNTER — Encounter: Payer: Self-pay | Admitting: Cardiology

## 2015-11-27 VITALS — BP 142/80 | HR 62 | Ht 61.0 in | Wt 186.0 lb

## 2015-11-27 DIAGNOSIS — N183 Chronic kidney disease, stage 3 unspecified: Secondary | ICD-10-CM

## 2015-11-27 DIAGNOSIS — I429 Cardiomyopathy, unspecified: Secondary | ICD-10-CM

## 2015-11-27 DIAGNOSIS — I428 Other cardiomyopathies: Secondary | ICD-10-CM

## 2015-11-27 DIAGNOSIS — I5042 Chronic combined systolic (congestive) and diastolic (congestive) heart failure: Secondary | ICD-10-CM

## 2015-11-27 MED ORDER — NITROGLYCERIN 0.4 MG SL SUBL: 0.4000 mg | SUBLINGUAL_TABLET | SUBLINGUAL | Status: AC | PRN

## 2015-11-27 NOTE — Progress Notes (Signed)
Cardiology Office Note  Date: 11/27/2015   ID: YANIYA GABAY, DOB 1946/08/26, MRN [id number]  PCP: Jani Gravel, MD  Primary Cardiologist: Rozann Lesches, MD   Chief Complaint  Patient presents with  . Cardiomyopathy    History of Present Illness: [name] is a 70 y.o. female last seen in January. At that time Demadex was increased to 80 mg twice daily. She reports good urine output on this regimen, although her weight still has not decreased. She does tell me that she has had trouble controlling her blood glucose, we also discussed her diet in general. Her weight gain may be multifactorial. She reports NYHA class II dyspnea on exertion at most, no orthopnea or PND, no exertional chest pain.  Recent creatinine has been in the 2.0-2.2 range. Her weight is up another 4 pounds. She continues to follow with Dr. Lowanda Foster.  We did discuss updating her echocardiogram. Last study was in February 2015, at which time LVEF was 45% with grade 1 diastolic dysfunction.  Past Medical History  Diagnosis Date  . Diabetes mellitus, type 2 ([place])   . Essential hypertension, benign   . Chronic systolic heart failure ([place])   . Coronary atherosclerosis of native coronary artery     Nonobstructive  . Nonischemic cardiomyopathy (HCC)     LVEF 20% up to 45%  . PAD (peripheral artery disease) (HCC)     Left foot gangrene  . Anxiety   . Depression   . MRSA bacteremia January 2015    Associated with probable tricuspid valve vegetation, ICD system extracted February 2015  . Anemia     Current Outpatient Prescriptions  Medication Sig Dispense Refill  . ALPRAZolam (XANAX) 0.5 MG tablet Take one tablet by mouth twice daily as needed for anxiety    . amLODipine (NORVASC) 10 MG tablet Take 1 tablet (10 mg total) by mouth daily. 30 tablet 6  . aspirin 81 MG tablet Take 81 mg by mouth daily.     . calcitRIOL (ROCALTROL) 0.25 MCG capsule Take one tablet by mouth on Monday, Wednesday, and Friday     . carvedilol (COREG) 25 MG tablet TAKE ONE TABLET BY MOUTH TWICE DAILY WITH MEALS 60 tablet 3  . Cholecalciferol (VITAMIN D-3) 1000 UNITS CAPS Take 1,000 Units by mouth daily.    Marland Kitchen docusate sodium (COLACE) 100 MG capsule Take 100 mg by mouth daily as needed (constipation).     . hydrALAZINE (APRESOLINE) 100 MG tablet Take 1 tablet (100 mg total) by mouth 3 (three) times daily. 270 tablet 3  . insulin glargine (LANTUS) 100 UNIT/ML injection Inject 30 Units into the skin daily.     . insulin lispro (HUMALOG) 100 UNIT/ML injection Inject 0-8 Units into the skin every evening. Sliding scale: 200-249=2 units, 250-299=4 units, 300-349=6 units, >350=8 units    . nitroGLYCERIN (NITROSTAT) 0.4 MG SL tablet Place 1 tablet (0.4 mg total) under the tongue every 5 (five) minutes as needed for chest pain. 25 tablet 3  . polyethylene glycol (MIRALAX / GLYCOLAX) packet Take 17 g by mouth daily as needed (constipation).     . sodium bicarbonate 650 MG tablet Take 650 mg by mouth 3 (three) times daily.    Marland Kitchen torsemide (DEMADEX) 20 MG tablet Take 4 tablets (80 mg total) by mouth 2 (two) times daily. 240 tablet 6  . vitamin C (ASCORBIC ACID) 500 MG tablet Take 500 mg by mouth daily.     No current facility-administered medications for this  visit.   Allergies:  Hydrocodone-acetaminophen   Social History: The patient  reports that she has never smoked. She has never used smokeless tobacco. She reports that she does not drink alcohol or use illicit drugs.   ROS:  Please see the history of present illness. Otherwise, complete review of systems is positive mild fatigue. for No change in mild dyspnea on exertion, no palpitations or syncope.  All other systems are reviewed and negative.   Physical Exam: VS:  BP 142/80 mmHg  Pulse 62  Ht 5\' 1"  (1.549 m)  Wt 186 lb (84.369 kg)  BMI 35.16 kg/m2  SpO2 97%, BMI Body mass index is 35.16 kg/(m^2).  Wt Readings from Last 3 Encounters:  11/27/15 186 lb (84.369 kg)    10/10/15 182 lb (82.555 kg)  09/09/15 182 lb 12.8 oz (82.918 kg)    Overweight woman, appears comfortable at rest. Using a walker. HEENT: Conjunctiva and lids normal, oropharynx clear.  Neck: No carotid bruis, no thyromegaly.  Lungs: Clear with diminished breath sounds, decreased at bases, nonlabored.  Cardiac: Indistinct PMI, regular rate and rhythm, 2/6 systolic murmur at the base, paradoxically split S2, no S3.  Abdomen: Protuberant.  Extremities: 1+ edema on right, status post left BKA with prosthesis in place, diminished distal pulses.  ECG: I personally reviewed the prior tracing from 09/09/2015 which showed sinus rhythm with left bundle branch block and PVCs.  Recent Labwork: 09/16/2015: BUN 46*; Creat 1.97*; Potassium 4.6; Sodium 139  January 2017: Potassium 3.8, BUN 63, creatinine 2.2, hemoglobin 10.6  Other Studies Reviewed Today:  Echocardiogram 11/07/2013:  Study Conclusions  - Left ventricle: Systolic function is mildly reduced. Estimated EF 45%. Mild global hypokinesis is seen. The cavity size was normal. Wall thickness was increased in a pattern of moderate LVH. There was an increased relative contribution of atrial contraction to ventricular filling. Doppler parameters are consistent with abnormal left ventricular relaxation (grade 1 diastolic dysfunction). - Ventricular septum: Septal motion showed abnormal function and dyssynergy. These changes are consistent with right ventricular pacing. - Aortic valve: Mildly calcified annulus. Mildly thickened, mildly calcified leaflets. There was mild stenosis. Peak velocity: 236cm/s (S). Mean gradient: 40mm Hg (S). - Mitral valve: Mildly thickened leaflets . - Right ventricle: The cavity size was normal. Pacer wire or catheter noted in right ventricle. Systolic function was mildly reduced. - Right atrium: Pacer wire or catheter noted in right atrium. - Tricuspid valve: There is marked  acoustic shadowing at the level of the tricuspid valve, possibly due to pacemaker wire. However, a vegetation cannot enitrely be excluded. Mild regurgitation. - Pericardium, extracardiac: Small pericardial effusion. Not hemodynamically significant.  Assessment and Plan:  1. Chronic combined heart failure. No significant decrease in weight, but she reports stable symptoms, NYHA class II dyspnea, no orthopnea or PND. Continue present regimen including Demadex dose. We discussed diet, caloric reduction. Also update echocardiogram to reassess cardiac structure and function.  2. Nonischemic cardiomyopathy with last LVEF 45%. She has a prior history of severe LV dysfunction in the range of 20% with previous ICD in place, however device had to be subsequently extracted secondary to endocarditis.  3. CKD, stage 3. Follows with Dr. Lowanda Foster. Last creatinine 2.2.  Current medicines were reviewed with the patient today.   Orders Placed This Encounter  Procedures  . Echocardiogram    Disposition: FU with me in 3 months.   Signed, Satira Sark, MD, [facility] 11/27/2015 9:11 AM    [place] at [facility]  [place] [address] [place],  [address] Phone: [phone]; Fax: [phone]

## 2015-11-27 NOTE — Patient Instructions (Signed)

## 2015-12-03 ENCOUNTER — Other Ambulatory Visit: Payer: Self-pay | Admitting: Cardiology

## 2015-12-10 ENCOUNTER — Ambulatory Visit (HOSPITAL_COMMUNITY)
Admission: RE | Admit: 2015-12-10 | Discharge: 2015-12-10 | Disposition: A | Discharge status: Home / Self Care | Payer: Medicare Other | Source: Ambulatory Visit | Attending: Cardiology | Admitting: Cardiology

## 2015-12-10 DIAGNOSIS — I429 Cardiomyopathy, unspecified: Secondary | ICD-10-CM | POA: Insufficient documentation

## 2015-12-10 DIAGNOSIS — I119 Hypertensive heart disease without heart failure: Secondary | ICD-10-CM | POA: Insufficient documentation

## 2015-12-10 DIAGNOSIS — I340 Nonrheumatic mitral (valve) insufficiency: Secondary | ICD-10-CM | POA: Insufficient documentation

## 2015-12-10 DIAGNOSIS — E119 Type 2 diabetes mellitus without complications: Secondary | ICD-10-CM | POA: Diagnosis not present

## 2015-12-10 DIAGNOSIS — I358 Other nonrheumatic aortic valve disorders: Secondary | ICD-10-CM | POA: Diagnosis not present

## 2015-12-30 ENCOUNTER — Ambulatory Visit (INDEPENDENT_AMBULATORY_CARE_PROVIDER_SITE_OTHER): Payer: Medicare Other | Admitting: Ophthalmology

## 2015-12-30 DIAGNOSIS — H35033 Hypertensive retinopathy, bilateral: Secondary | ICD-10-CM

## 2015-12-30 DIAGNOSIS — H43813 Vitreous degeneration, bilateral: Secondary | ICD-10-CM | POA: Diagnosis not present

## 2015-12-30 DIAGNOSIS — I10 Essential (primary) hypertension: Secondary | ICD-10-CM | POA: Diagnosis not present

## 2015-12-30 DIAGNOSIS — E113593 Type 2 diabetes mellitus with proliferative diabetic retinopathy without macular edema, bilateral: Secondary | ICD-10-CM

## 2015-12-30 DIAGNOSIS — E11319 Type 2 diabetes mellitus with unspecified diabetic retinopathy without macular edema: Secondary | ICD-10-CM

## 2016-01-22 ENCOUNTER — Other Ambulatory Visit: Payer: Self-pay

## 2016-01-22 ENCOUNTER — Other Ambulatory Visit: Payer: Self-pay | Admitting: Cardiology

## 2016-01-22 MED ORDER — AMLODIPINE BESYLATE 10 MG PO TABS: 10.0000 mg | ORAL_TABLET | Freq: Every day | ORAL | Status: DC

## 2016-01-22 NOTE — Telephone Encounter (Signed)
Refill [facility]ipine to [facility] done

## 2016-02-20 ENCOUNTER — Ambulatory Visit (INDEPENDENT_AMBULATORY_CARE_PROVIDER_SITE_OTHER): Payer: Medicare Other | Admitting: Cardiology

## 2016-02-20 ENCOUNTER — Encounter: Payer: Self-pay | Admitting: Cardiology

## 2016-02-20 VITALS — BP 128/50 | HR 58 | Ht 61.0 in | Wt 190.0 lb

## 2016-02-20 DIAGNOSIS — N183 Chronic kidney disease, stage 3 unspecified: Secondary | ICD-10-CM

## 2016-02-20 DIAGNOSIS — I5042 Chronic combined systolic (congestive) and diastolic (congestive) heart failure: Secondary | ICD-10-CM | POA: Diagnosis not present

## 2016-02-20 DIAGNOSIS — I10 Essential (primary) hypertension: Secondary | ICD-10-CM

## 2016-02-20 MED ORDER — AMLODIPINE BESYLATE 10 MG PO TABS: 10.0000 mg | ORAL_TABLET | Freq: Every day | ORAL | Status: DC

## 2016-02-20 MED ORDER — HYDRALAZINE HCL 100 MG PO TABS: 100.0000 mg | ORAL_TABLET | Freq: Three times a day (TID) | ORAL | Status: DC

## 2016-02-20 MED ORDER — CARVEDILOL 25 MG PO TABS: 25.0000 mg | ORAL_TABLET | Freq: Two times a day (BID) | ORAL | Status: DC

## 2016-02-20 NOTE — Patient Instructions (Signed)
Your physician recommends that you schedule a follow-up appointment in:  3 months with Dr McDowell     Your physician recommends that you continue on your current medications as directed. Please refer to the Current Medication list given to you today.      Thank you for choosing [facility] !         

## 2016-02-20 NOTE — Progress Notes (Signed)
Cardiology Office Note  Date: 02/20/2016   ID: [name], DOB [birth year], MRN [id number]  PCP: Jani Gravel, MD  Primary Cardiologist: Rozann Lesches, MD   Chief Complaint  Patient presents with  . Cardiomyopathy    History of Present Illness: [name] is a 70 y.o. female last seen in February. She presents for a routine follow-up visit. Still struggling to keep her weight down, but this is compounded by decreased activity and more caloric intake. She has had relatively stable edema in her right leg and remains on Demadex at 80 mg twice daily.  She had a follow-up echocardiogram in March showing improvement in LVEF to the range of 45-50%. We discussed the results today.  Her last creatinine was up to 2.2. She has a follow-up visit with Dr. Lowanda Foster next month.  Past Medical History  Diagnosis Date  . Diabetes mellitus, type 2 ([place])   . Essential hypertension, benign   . Chronic systolic heart failure ([place])   . Coronary atherosclerosis of native coronary artery     Nonobstructive  . Nonischemic cardiomyopathy (HCC)     LVEF 20% up to 45%  . PAD (peripheral artery disease) (HCC)     Left foot gangrene  . Anxiety   . Depression   . MRSA bacteremia January 2015    Associated with probable tricuspid valve vegetation, ICD system extracted February 2015  . Anemia     Current Outpatient Prescriptions  Medication Sig Dispense Refill  . ALPRAZolam (XANAX) 0.5 MG tablet Take one tablet by mouth twice daily as needed for anxiety    . amLODipine (NORVASC) 10 MG tablet Take 1 tablet (10 mg total) by mouth daily. 30 tablet 6  . aspirin 81 MG tablet Take 81 mg by mouth daily.     . calcitRIOL (ROCALTROL) 0.25 MCG capsule Take one tablet by mouth on Monday, Wednesday, and Friday    . carvedilol (COREG) 25 MG tablet TAKE ONE TABLET BY MOUTH TWICE DAILY WITH MEALS 60 tablet 3  . Cholecalciferol (VITAMIN D-3) 1000 UNITS CAPS Take 1,000 Units by mouth daily.    Marland Kitchen docusate  sodium (COLACE) 100 MG capsule Take 100 mg by mouth daily as needed (constipation).     . hydrALAZINE (APRESOLINE) 100 MG tablet TAKE ONE TABLET BY MOUTH THREE TIMES DAILY 270 tablet 1  . insulin glargine (LANTUS) 100 UNIT/ML injection Inject 30 Units into the skin daily.     . insulin lispro (HUMALOG) 100 UNIT/ML injection Inject 0-8 Units into the skin every evening. Sliding scale: 200-249=2 units, 250-299=4 units, 300-349=6 units, >350=8 units    . nitroGLYCERIN (NITROSTAT) 0.4 MG SL tablet Place 1 tablet (0.4 mg total) under the tongue every 5 (five) minutes as needed for chest pain. 25 tablet 3  . polyethylene glycol (MIRALAX / GLYCOLAX) packet Take 17 g by mouth daily as needed (constipation).     . sodium bicarbonate 650 MG tablet Take 650 mg by mouth 3 (three) times daily.    Marland Kitchen torsemide (DEMADEX) 20 MG tablet Take 4 tablets (80 mg total) by mouth 2 (two) times daily. 240 tablet 6   No current facility-administered medications for this visit.   Allergies:  Hydrocodone-acetaminophen   Social History: The patient  reports that she has never smoked. She has never used smokeless tobacco. She reports that she does not drink alcohol or use illicit drugs.   ROS:  Please see the history of present illness. Otherwise, complete review of systems is  positive for seasonal allergies.  All other systems are reviewed and negative.   Physical Exam: VS:  BP 128/50 mmHg  Pulse 58  Ht 5\' 1"  (1.549 m)  Wt 190 lb (86.183 kg)  BMI 35.92 kg/m2  SpO2 90%, BMI Body mass index is 35.92 kg/(m^2).  Wt Readings from Last 3 Encounters:  02/20/16 190 lb (86.183 kg)  11/27/15 186 lb (84.369 kg)  10/10/15 182 lb (82.555 kg)    Overweight woman, appears comfortable at rest. Using a walker. HEENT: Conjunctiva and lids normal, oropharynx clear.  Neck: No carotid bruis, no thyromegaly.  Lungs: Clear with diminished breath sounds, decreased at bases, nonlabored.  Cardiac: Indistinct PMI, regular rate and  rhythm, 2/6 systolic murmur at the base, paradoxically split S2, no S3.  Abdomen: Protuberant.  Extremities: 1-2+ edema on right, status post left BKA with prosthesis in place, diminished distal pulses.  ECG: I personally reviewed the prior tracing from 09/09/2015 which showed sinus rhythm with left bundle branch block and PVC.  Recent Labwork: 09/16/2015: BUN 46*; Creat 1.97*; Potassium 4.6; Sodium 139  January 2017: Potassium 3.8, BUN 63, creatinine 2.2, hemoglobin 10.6  Other Studies Reviewed Today:  Echocardiogram 12/10/2015: Study Conclusions  - Left ventricle: The cavity size was normal. Wall thickness was  increased in a pattern of moderate LVH. Systolic function was  mildly reduced. The estimated ejection fraction was in the range  of 45% to 50%. Wall motion was normal; there were no regional  wall motion abnormalities. Doppler parameters are consistent with  abnormal left ventricular relaxation (grade 1 diastolic  dysfunction). - Aortic valve: Mildly calcified annulus. Trileaflet; mildly  thickened leaflets. Valve area (VTI): 1.41 cm^2. Valve area  (Vmax): 1.43 cm^2. - Mitral valve: Mildly calcified annulus. Mildly thickened leaflets  . There was mild regurgitation. - Left atrium: The atrium was mildly dilated. - Atrial septum: No defect or patent foramen ovale was identified. - Pulmonary arteries: Systolic pressure was moderately increased.  PA peak pressure: 53 mm Hg (S). - Technically adequate study.  Assessment and Plan:  1. Chronic combined heart failure with most recent LVEF 45-50% and grade 1 diastolic dysfunction. She also has moderate pulmonary hypertension. For now we will continue the present dose of Demadex 80 mg twice daily. She will keep her follow-up with Dr. Lowanda Foster next month for repeat lab work. If creatinine continues to climb, could consider cutting back diuretics.  2. CKD stage III, recent creatinine 2.2.  3. Essential hypertension,  blood pressure is adequately controlled today.  Current medicines were reviewed with the patient today.  Disposition: FU with me in 3 months.   Signed, Satira Sark, MD, [facility] 02/20/2016 8:54 AM    [place] at [place]. [address], [place], [place] [address] Phone: [phone]; Fax: [phone]

## 2016-05-19 ENCOUNTER — Telehealth: Payer: Self-pay | Admitting: Cardiology

## 2016-05-19 NOTE — Telephone Encounter (Signed)
Advised pt to come come fasting that am and if labs are ordered ,she can do that day

## 2016-05-22 NOTE — Progress Notes (Signed)
Cardiology Office Note  Date: 05/25/2016   ID: [name], DOB [birth year], MRN [id number]  PCP: Jani Gravel, MD  Primary Cardiologist: Rozann Lesches, MD   Chief Complaint  Patient presents with  . Cardiomyopathy    History of Present Illness: [name] is a 70 y.o. female last seen in May. She presents for a follow-up visit. Weight is stable compared to last time. She is trying to lose weight through diet, from a volume perspective seems to be doing reasonably well on high-dose Demadex however. I requested her most recent lab work from PCP and Nephrology. She reports NYHA class II dyspnea with typical activities. She has a left leg prosthesis and uses a walker to get around. States that she will be having a new prosthesis fit soon.  Past Medical History:  Diagnosis Date  . Anemia   . Anxiety   . Chronic systolic heart failure ([place])   . Coronary atherosclerosis of native coronary artery    Nonobstructive  . Depression   . Diabetes mellitus, type 2 ([place])   . Essential hypertension, benign   . MRSA bacteremia January 2015   Associated with probable tricuspid valve vegetation, ICD system extracted February 2015  . Nonischemic cardiomyopathy (HCC)    LVEF 20% up to 45%  . PAD (peripheral artery disease) (HCC)    Left foot gangrene    Current Outpatient Prescriptions  Medication Sig Dispense Refill  . ALPRAZolam (XANAX) 0.5 MG tablet Take one tablet by mouth twice daily as needed for anxiety    . amLODipine (NORVASC) 10 MG tablet Take 1 tablet (10 mg total) by mouth daily. 30 tablet 6  . aspirin 81 MG tablet Take 81 mg by mouth daily.     . calcitRIOL (ROCALTROL) 0.25 MCG capsule 0.25 mcg daily.     . carvedilol (COREG) 25 MG tablet Take 1 tablet (25 mg total) by mouth 2 (two) times daily with a meal. 60 tablet 3  . Cholecalciferol (VITAMIN D-3) 1000 UNITS CAPS Take 1,000 Units by mouth daily.    Marland Kitchen docusate sodium (COLACE) 100 MG capsule Take 100 mg by mouth  daily as needed (constipation).     . hydrALAZINE (APRESOLINE) 100 MG tablet Take 1 tablet (100 mg total) by mouth 3 (three) times daily. 270 tablet 1  . insulin glargine (LANTUS) 100 UNIT/ML injection Inject 30 Units into the skin daily.     . insulin lispro (HUMALOG) 100 UNIT/ML injection Inject 0-8 Units into the skin every evening. Sliding scale: 200-249=2 units, 250-299=4 units, 300-349=6 units, >350=8 units    . nitroGLYCERIN (NITROSTAT) 0.4 MG SL tablet Place 1 tablet (0.4 mg total) under the tongue every 5 (five) minutes as needed for chest pain. 25 tablet 3  . polyethylene glycol (MIRALAX / GLYCOLAX) packet Take 17 g by mouth daily as needed (constipation).     . sodium bicarbonate 650 MG tablet Take 650 mg by mouth 3 (three) times daily.    Marland Kitchen torsemide (DEMADEX) 20 MG tablet Take 4 tablets (80 mg total) by mouth 2 (two) times daily. 240 tablet 6   No current facility-administered medications for this visit.    Allergies:  Hydrocodone-acetaminophen   Social History: The patient  reports that she has never smoked. She has never used smokeless tobacco. She reports that she does not drink alcohol or use drugs.   ROS:  Please see the history of present illness. Otherwise, complete review of systems is positive for none.  All  other systems are reviewed and negative.   Physical Exam: VS:  BP 140/60   Pulse 60   Ht 5\' 1"  (1.549 m)   Wt 190 lb (86.2 kg)   SpO2 95%   BMI 35.90 kg/m , BMI Body mass index is 35.9 kg/m.  Wt Readings from Last 3 Encounters:  05/25/16 190 lb (86.2 kg)  02/20/16 190 lb (86.2 kg)  11/27/15 186 lb (84.4 kg)    Overweight woman, appears comfortable at rest. Using a walker. HEENT: Conjunctiva and lids normal, oropharynx clear.  Neck: No carotid bruis, no thyromegaly.  Lungs: Clear with diminished breath sounds, decreased at bases, nonlabored.  Cardiac: Indistinct PMI, regular rate and rhythm, 2/6 systolic murmur at the base, paradoxically split S2, no  S3.  Abdomen: Protuberant.  Extremities: 1+ edema on right, status post left BKA with prosthesis in place, diminished distal pulses.  ECG: I personally reviewed the tracing from 09/09/2015 which showed sinus rhythm with left bundle-branch block and PVC.  Recent Labwork: 09/16/2015: BUN 46; Creat 1.97; Potassium 4.6; Sodium 139  January 2017: Potassium 3.8, BUN 63, creatinine 2.2  Other Studies Reviewed Today:  Echocardiogram 12/10/2015: Study Conclusions  - Left ventricle: The cavity size was normal. Wall thickness was  increased in a pattern of moderate LVH. Systolic function was  mildly reduced. The estimated ejection fraction was in the range  of 45% to 50%. Wall motion was normal; there were no regional  wall motion abnormalities. Doppler parameters are consistent with  abnormal left ventricular relaxation (grade 1 diastolic  dysfunction). - Aortic valve: Mildly calcified annulus. Trileaflet; mildly  thickened leaflets. Valve area (VTI): 1.41 cm^2. Valve area  (Vmax): 1.43 cm^2. - Mitral valve: Mildly calcified annulus. Mildly thickened leaflets  . There was mild regurgitation. - Left atrium: The atrium was mildly dilated. - Atrial septum: No defect or patent foramen ovale was identified. - Pulmonary arteries: Systolic pressure was moderately increased.  PA peak pressure: 53 mm Hg (S). - Technically adequate study.  Assessment and Plan:  1. Chronic combined heart failure with LVEF 45-50% by most recent assessment. Plan to continue current regimen and diuretic dosing. Requesting her most recent lab work from PCP and Nephrology. We discussed sodium and fluid restriction guidelines. Her weight is stable. Follow-up BMET for next visit.  2. Nonischemic cardiomyopathy with improvement in LVEF overall compared to original diagnosis. Continue medical therapy.  3. 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  4. CKD  stage 3.  Current medicines were reviewed with the patient today.   Orders Placed This Encounter  Procedures  . Basic Metabolic Panel (BMET)    Disposition: Follow-up with me in 6 months.  Signed, Satira Sark, MD, [facility] 05/25/2016 8:45 AM    [facility] at [facility] [address] [place], [place] [address] Phone: [phone]; Fax: [phone]

## 2016-05-25 ENCOUNTER — Encounter: Payer: Self-pay | Admitting: Cardiology

## 2016-05-25 ENCOUNTER — Ambulatory Visit (INDEPENDENT_AMBULATORY_CARE_PROVIDER_SITE_OTHER): Payer: Medicare Other | Admitting: Cardiology

## 2016-05-25 ENCOUNTER — Encounter (INDEPENDENT_AMBULATORY_CARE_PROVIDER_SITE_OTHER): Payer: Self-pay

## 2016-05-25 VITALS — BP 140/60 | HR 60 | Ht 61.0 in | Wt 190.0 lb

## 2016-05-25 DIAGNOSIS — I5042 Chronic combined systolic (congestive) and diastolic (congestive) heart failure: Secondary | ICD-10-CM

## 2016-05-25 DIAGNOSIS — N183 Chronic kidney disease, stage 3 (moderate): Secondary | ICD-10-CM

## 2016-05-25 NOTE — Patient Instructions (Signed)
Your physician wants you to follow-up in:  6 months Dr Ferne Reus will receive a reminder letter in the mail two months in advance. If you don't receive a letter, please call our office to schedule the follow-up appointment.    Get LAB WORK JUST BEFORE 6 MONTH VISIT :  BMET     Your physician recommends that you continue on your current medications as directed. Please refer to the Current Medication list given to you today.       Thank you for choosing [facility] !

## 2016-07-06 ENCOUNTER — Ambulatory Visit (INDEPENDENT_AMBULATORY_CARE_PROVIDER_SITE_OTHER): Payer: Medicare Other | Admitting: Ophthalmology

## 2016-07-06 DIAGNOSIS — H35033 Hypertensive retinopathy, bilateral: Secondary | ICD-10-CM

## 2016-07-06 DIAGNOSIS — H43812 Vitreous degeneration, left eye: Secondary | ICD-10-CM

## 2016-07-06 DIAGNOSIS — E113513 Type 2 diabetes mellitus with proliferative diabetic retinopathy with macular edema, bilateral: Secondary | ICD-10-CM | POA: Diagnosis not present

## 2016-07-06 DIAGNOSIS — H35341 Macular cyst, hole, or pseudohole, right eye: Secondary | ICD-10-CM | POA: Diagnosis not present

## 2016-07-06 DIAGNOSIS — I10 Essential (primary) hypertension: Secondary | ICD-10-CM | POA: Diagnosis not present

## 2016-07-06 DIAGNOSIS — E11311 Type 2 diabetes mellitus with unspecified diabetic retinopathy with macular edema: Secondary | ICD-10-CM

## 2016-08-31 ENCOUNTER — Other Ambulatory Visit: Payer: Self-pay | Admitting: *Deleted

## 2016-08-31 ENCOUNTER — Telehealth: Payer: Self-pay | Admitting: Cardiology

## 2016-08-31 MED ORDER — CARVEDILOL 25 MG PO TABS: 25.0000 mg | ORAL_TABLET | Freq: Two times a day (BID) | ORAL | 6 refills | Status: DC

## 2016-08-31 MED ORDER — TORSEMIDE 20 MG PO TABS: 80.0000 mg | ORAL_TABLET | Freq: Two times a day (BID) | ORAL | 6 refills | Status: DC

## 2016-08-31 NOTE — Telephone Encounter (Signed)
1. Which medications need to be refilled? (please list name of each medication and dose if known)   1.carvedilol (COREG) 25 MG tablet [[id number]]   2.torsemide (DEMADEX) 20 MG tablet [[id number]]    2. Which pharmacy/location (including street and city if local pharmacy) is medication to be sent to?   [facility]   3. Do they need a 30 day or 90 day supply?

## 2016-09-22 ENCOUNTER — Telehealth: Payer: Self-pay | Admitting: Cardiology

## 2016-09-22 NOTE — Telephone Encounter (Signed)
Please call patient due to BP concerns. / tg

## 2016-09-23 NOTE — Telephone Encounter (Signed)
Returned pt call. She was concerned that her blood pressure was a little high. ( 170/80, 156/70 , 155/72, 152/77 ) These are her readings when she first gets up in the morning before taking her medication. She was wondering if she should wait to take her blood pressure after she has taken her medication. Please advise.

## 2016-09-24 NOTE — Telephone Encounter (Signed)
Pt made aware, she voiced understanding. She will call next week to give blood pressure readings.

## 2016-09-24 NOTE — Telephone Encounter (Deleted)
Pt made aware, copy to pcp.  

## 2016-09-24 NOTE — Telephone Encounter (Signed)
Yes, it would be good to see what her blood pressure does after she takes her medication.

## 2016-11-30 LAB — BASIC METABOLIC PANEL
BUN: 58 mg/dL — ABNORMAL HIGH (ref 7–25)
CALCIUM: 9.4 mg/dL (ref 8.6–10.4)
CO2: 25 mmol/L (ref 20–31)
CREATININE: 2.71 mg/dL — AB (ref 0.60–0.93)
Chloride: 104 mmol/L (ref 98–110)
GLUCOSE: 207 mg/dL — AB (ref 65–99)
Potassium: 4.1 mmol/L (ref 3.5–5.3)
SODIUM: 141 mmol/L (ref 135–146)

## 2016-12-01 ENCOUNTER — Telehealth: Payer: Self-pay

## 2016-12-01 MED ORDER — TORSEMIDE 20 MG PO TABS: ORAL_TABLET | ORAL | 3 refills | Status: DC

## 2016-12-01 NOTE — Telephone Encounter (Signed)
-----   Message from Satira Sark, MD sent at 12/01/2016  7:49 AM EST ----- Results reviewed. I do not have interval lab work over the last year regarding renal function. Creatinine is 2.7, significantly higher than a year ago. Would have her cut Demadex back to 60 mg twice daily. Please forward these results to her nephrologist as well. A copy of this test should be forwarded to Jani Gravel, MD.

## 2016-12-01 NOTE — Telephone Encounter (Signed)
Pt states she just saw Dr Lowanda Foster last week and he told her her labs were "holding steady". I informed her to decrease Demadex to 60 mg twice a day, our labs faxed to him

## 2016-12-07 ENCOUNTER — Encounter: Payer: Self-pay | Admitting: Cardiology

## 2016-12-07 ENCOUNTER — Ambulatory Visit (INDEPENDENT_AMBULATORY_CARE_PROVIDER_SITE_OTHER): Payer: Medicare Other | Admitting: Cardiology

## 2016-12-07 VITALS — BP 110/62 | HR 63 | Ht 61.0 in | Wt 190.0 lb

## 2016-12-07 DIAGNOSIS — N183 Chronic kidney disease, stage 3 unspecified: Secondary | ICD-10-CM

## 2016-12-07 DIAGNOSIS — I10 Essential (primary) hypertension: Secondary | ICD-10-CM

## 2016-12-07 DIAGNOSIS — I428 Other cardiomyopathies: Secondary | ICD-10-CM

## 2016-12-07 DIAGNOSIS — I5042 Chronic combined systolic (congestive) and diastolic (congestive) heart failure: Secondary | ICD-10-CM

## 2016-12-07 MED ORDER — HYDRALAZINE HCL 100 MG PO TABS: 100.0000 mg | ORAL_TABLET | Freq: Three times a day (TID) | ORAL | 11 refills | Status: DC

## 2016-12-07 NOTE — Patient Instructions (Signed)
Your physician wants you to follow-up in: 6 months with Dr McDowell You will receive a reminder letter in the mail two months in advance. If you don't receive a letter, please call our office to schedule the follow-up appointment.     Your physician recommends that you continue on your current medications as directed. Please refer to the Current Medication list given to you today.    If you need a refill on your cardiac medications before your next appointment, please call your pharmacy.     Thank you for choosing [facility] !        

## 2016-12-07 NOTE — Progress Notes (Signed)
Cardiology Office Note  Date: 12/07/2016   ID: [name], DOB [birth year], MRN [id number]  PCP: Jani Gravel, MD  Primary Cardiologist: Rozann Lesches, MD   Chief Complaint  Patient presents with  . Cardiomyopathy    History of Present Illness: [name] is a 71 y.o. female last seen in August 2017. She presents for a routine follow-up visit. Reports no change in dyspnea on exertion, presently NYHA class II. No palpitations or chest pain. Reports stable weight, confirmed by our scales today.  Recent lab work from February revealed creatinine 2.7. Demadex has been cut back to 60 mg twice daily. We will see how she does on less diuretic dose. She continues to follow with Dr. Lowanda Foster as well.  States that her morning blood pressures have been elevated in the 150s to 160s. Generally she spaces her medicines out well during the day, we discussed moving her amlodipine closer to the evening.  I personally reviewed her ECG today which shows sinus rhythm with left bundle-branch block.  Past Medical History:  Diagnosis Date  . Anemia   . Anxiety   . Chronic systolic heart failure ([place])   . Coronary atherosclerosis of native coronary artery    Nonobstructive  . Depression   . Diabetes mellitus, type 2 ([place])   . Essential hypertension, benign   . MRSA bacteremia January 2015   Associated with probable tricuspid valve vegetation, ICD system extracted February 2015  . Nonischemic cardiomyopathy (HCC)    LVEF 20% up to 45%  . PAD (peripheral artery disease) (HCC)    Left foot gangrene    Past Surgical History:  Procedure Laterality Date  . [place] VITRECTOMY WITH 20 GAUGE MVR PORT FOR MACULAR HOLE Right 11/10/2012   Procedure: 25 GAUGE PARS PLANA VITRECTOMY WITH 20 GAUGE MVR PORT FOR MACULAR HOLE;  Surgeon: Hayden Pedro, MD;  Location: [facility];  Service: Ophthalmology;  Laterality: Right;  . 25 GAUGE PARS PLANA VITRECTOMY WITH 20 GAUGE MVR PORT FOR MACULAR  HOLE Right 05/09/2013   Procedure: 25 GAUGE PARS PLANA VITRECTOMY WITH 20 GAUGE MVR PORT FOR MACULAR HOLE;  Surgeon: Hayden Pedro, MD;  Location: [place];  Service: Ophthalmology;  Laterality: Right;  . ABDOMINAL HYSTERECTOMY    . AMPUTATION Left 03/23/2014   Procedure: Left Foot 1st Ray Amputation;  Surgeon: Newt Minion, MD;  Location: [place];  Service: Orthopedics;  Laterality: Left;  Left Foot 1st Ray Amputation  . AMPUTATION Left 04/13/2014   Procedure: AMPUTATION BELOW KNEE;  Surgeon: Newt Minion, MD;  Location: [facility];  Service: Orthopedics;  Laterality: Left;  Left Below Knee Amputation  . BLADDER SURGERY  2011 BENIGN MASS REMOVED FROM BLADDER  . BREAST LUMPECTOMY     Bil  . CARDIAC DEFIBRILLATOR PLACEMENT     Medtronic [id number]   . CENTRAL VENOUS CATHETER INSERTION Right 11/09/2013   Procedure: INSERTION CENTRAL LINE ADULT;  Surgeon: Evans Lance, MD;  Location: [facility];  Service: Cardiovascular;  Laterality: Right;  . Exploratory laparotomy with lysis of adhesions    . EYE SURGERY  BILATERAL CATARACTS REMOVAL  10 YEARS AGO PER PATIENT  . GAS INSERTION Right 11/10/2012   Procedure: INSERTION OF GAS;  Surgeon: Hayden Pedro, MD;  Location: [facility];  Service: Ophthalmology;  Laterality: Right;  C3F8  . GAS/FLUID EXCHANGE Right 05/09/2013   Procedure: GAS/FLUID EXCHANGE;  Surgeon: Hayden Pedro, MD;  Location: [place] [facility];  Service: Ophthalmology;  Laterality: Right;  C3F8   . ICD LEAD REMOVAL N/A 11/09/2013   Procedure: ICD LEAD REMOVAL;  Surgeon: Evans Lance, MD;  Location: [facility];  Service: Cardiovascular;  Laterality: N/A;  . LASER PHOTO ABLATION Right 05/09/2013   Procedure: LASER PHOTO ABLATION;  Surgeon: Hayden Pedro, MD;  Location: [place] [place];  Service: Ophthalmology;  Laterality: Right;  Endolaser   . Left shoulder surgery     Rotator Cuff  . MEMBRANE PEEL Right 05/09/2013   Procedure: MEMBRANE PEEL;  Surgeon: Hayden Pedro, MD;  Location: [place];  Service: Ophthalmology;  Laterality:  Right;  . PR VEIN BYPASS GRAFT,AORTO-FEM-POP  05-2011   Left popliteal- PTA graft   . SERUM PATCH Right 11/10/2012   Procedure: SERUM PATCH;  Surgeon: Hayden Pedro, MD;  Location: [place];  Service: Ophthalmology;  Laterality: Right;  . SERUM PATCH Right 05/09/2013   Procedure: SERUM PATCH;  Surgeon: Hayden Pedro, MD;  Location: [place];  Service: Ophthalmology;  Laterality: Right;  . STUMP REVISION Left 05/30/2014   Procedure: Left Below Knee Amputation Revision;  Surgeon: Newt Minion, MD;  Location: [place];  Service: Orthopedics;  Laterality: Left;  . TOE AMPUTATION Left    great toe  . Viterectomy  11/10/2012   OD   Dr Zigmund Daniel    Current Outpatient Prescriptions  Medication Sig Dispense Refill  . ALPRAZolam (XANAX) 0.5 MG tablet Take one tablet by mouth twice daily as needed for anxiety    . amLODipine (NORVASC) 10 MG tablet Take 1 tablet (10 mg total) by mouth daily. 30 tablet 6  . aspirin 81 MG tablet Take 81 mg by mouth daily.     . calcitRIOL (ROCALTROL) 0.25 MCG capsule 0.25 mcg daily.     . carvedilol (COREG) 25 MG tablet Take 1 tablet (25 mg total) by mouth 2 (two) times daily with a meal. 60 tablet 6  . Cholecalciferol (VITAMIN D-3) 1000 UNITS CAPS Take 1,000 Units by mouth daily.    Marland Kitchen docusate sodium (COLACE) 100 MG capsule Take 100 mg by mouth daily as needed (constipation).     . hydrALAZINE (APRESOLINE) 100 MG tablet Take 1 tablet (100 mg total) by mouth 3 (three) times daily. 270 tablet 1  . insulin glargine (LANTUS) 100 UNIT/ML injection Inject 30 Units into the skin daily.     . insulin lispro (HUMALOG) 100 UNIT/ML injection Inject 0-8 Units into the skin every evening. Sliding scale: 200-249=2 units, 250-299=4 units, 300-349=6 units, >350=8 units    . nitroGLYCERIN (NITROSTAT) 0.4 MG SL tablet Place 1 tablet (0.4 mg total) under the tongue every 5 (five) minutes as needed for chest pain. 25 tablet 3  . polyethylene glycol (MIRALAX / GLYCOLAX) packet Take 17 g by mouth  daily as needed (constipation).     . sodium bicarbonate 650 MG tablet Take 650 mg by mouth 3 (three) times daily.    Marland Kitchen torsemide (DEMADEX) 20 MG tablet Take 60 mg (3 tablets) twice a day 180 tablet 3   No current facility-administered medications for this visit.    Allergies:  Hydrocodone-acetaminophen   Social History: The patient  reports that she has never smoked. She has never used smokeless tobacco. She reports that she does not drink alcohol or use drugs.   ROS:  Please see the history of present illness. Otherwise, complete review of systems is positive for none.  All other systems are reviewed and negative.   Physical Exam: VS:  BP 110/62  Pulse 63   Ht 5\' 1"  (1.549 m)   Wt 190 lb (86.2 kg)   SpO2 (!) 89%   BMI 35.90 kg/m , BMI Body mass index is 35.9 kg/m.  Wt Readings from Last 3 Encounters:  12/07/16 190 lb (86.2 kg)  05/25/16 190 lb (86.2 kg)  02/20/16 190 lb (86.2 kg)    Overweight woman, appears comfortable at rest. Using a walker. HEENT: Conjunctiva and lids normal, oropharynx clear.  Neck: No carotid bruis, no thyromegaly.  Lungs: Clear with diminished breath sounds, decreased at bases, nonlabored.  Cardiac: Indistinct PMI, regular rate and rhythm, 2/6 systolic murmur at the base, paradoxically split S2, no S3.  Abdomen: Protuberant.  Extremities: 1+ edema on right, status post left BKA with prosthesis in place, diminished distal pulses. Skin: Warm and dry. Musculoskeletal: No kyphosis. Neuropsychiatric: Alert and oriented 3, affect appropriate.  ECG: I personally reviewed the tracing from 09/09/2015 which showed sinus rhythm with left bundle-branch block and PVC.  Recent Labwork: 11/30/2016: BUN 58; Creat 2.71; Potassium 4.1; Sodium 141   Other Studies Reviewed Today:  Echocardiogram 12/10/2015: Study Conclusions  - Left ventricle: The cavity size was normal. Wall thickness was  increased in a pattern of moderate LVH. Systolic function was   mildly reduced. The estimated ejection fraction was in the range  of 45% to 50%. Wall motion was normal; there were no regional  wall motion abnormalities. Doppler parameters are consistent with  abnormal left ventricular relaxation (grade 1 diastolic  dysfunction). - Aortic valve: Mildly calcified annulus. Trileaflet; mildly  thickened leaflets. Valve area (VTI): 1.41 cm^2. Valve area  (Vmax): 1.43 cm^2. - Mitral valve: Mildly calcified annulus. Mildly thickened leaflets  . There was mild regurgitation. - Left atrium: The atrium was mildly dilated. - Atrial septum: No defect or patent foramen ovale was identified. - Pulmonary arteries: Systolic pressure was moderately increased.  PA peak pressure: 53 mm Hg (S). - Technically adequate study.  Assessment and Plan:  1. Chronic combined heart failure with LVEF 45-50%. Weight is stable. We did cut Demadex dose back to 60 mg BID based on progressive renal insufficiency, so far she is tolerating well.  2. Nonischemic cardiomyopathy with prior documentation of mild nonobstructive CAD.  3. CKD stage 3, most recent creatinine 2.7. She continues to follow with Dr. Lowanda Foster.  4. Essential hypertension. Discussed moving amlodipine dose closer to the evening due to elevated blood pressures in the mornings. Otherwise continue hydralazine and Coreg.  Current medicines were reviewed with the patient today.   Orders Placed This Encounter  Procedures  . EKG 12-Lead    Disposition: Follow-up in 6 months.  Signed, Satira Sark, MD, [facility] 12/07/2016 9:10 AM    [place] [facility] [address] [place], [place] [address] Phone: [phone]; Fax: [phone]

## 2017-01-04 ENCOUNTER — Ambulatory Visit (INDEPENDENT_AMBULATORY_CARE_PROVIDER_SITE_OTHER): Payer: Medicare Other | Admitting: Ophthalmology

## 2017-02-01 ENCOUNTER — Ambulatory Visit (INDEPENDENT_AMBULATORY_CARE_PROVIDER_SITE_OTHER): Payer: Medicare Other | Admitting: Ophthalmology

## 2017-02-01 DIAGNOSIS — I10 Essential (primary) hypertension: Secondary | ICD-10-CM

## 2017-02-01 DIAGNOSIS — H35341 Macular cyst, hole, or pseudohole, right eye: Secondary | ICD-10-CM

## 2017-02-01 DIAGNOSIS — H35033 Hypertensive retinopathy, bilateral: Secondary | ICD-10-CM | POA: Diagnosis not present

## 2017-02-01 DIAGNOSIS — E113511 Type 2 diabetes mellitus with proliferative diabetic retinopathy with macular edema, right eye: Secondary | ICD-10-CM | POA: Diagnosis not present

## 2017-02-01 DIAGNOSIS — E113592 Type 2 diabetes mellitus with proliferative diabetic retinopathy without macular edema, left eye: Secondary | ICD-10-CM | POA: Diagnosis not present

## 2017-02-01 DIAGNOSIS — H43812 Vitreous degeneration, left eye: Secondary | ICD-10-CM | POA: Diagnosis not present

## 2017-02-01 DIAGNOSIS — E11311 Type 2 diabetes mellitus with unspecified diabetic retinopathy with macular edema: Secondary | ICD-10-CM

## 2017-03-20 ENCOUNTER — Other Ambulatory Visit: Payer: Self-pay | Admitting: Cardiology

## 2017-04-22 ENCOUNTER — Other Ambulatory Visit: Payer: Self-pay | Admitting: Cardiology

## 2017-05-07 ENCOUNTER — Telehealth: Payer: Self-pay | Admitting: Cardiology

## 2017-05-07 NOTE — Telephone Encounter (Signed)
Per phone call--pt is still having high BP readings even w/ the medication change

## 2017-05-07 NOTE — Telephone Encounter (Signed)
Taking amlodipine now at night and BP is still high in the am , was 180/84, yesterday 174/76    BP in daytime is 160/78, 149/62, still worried she will have a stroke with high blood pressures

## 2017-05-10 NOTE — Telephone Encounter (Signed)
Try and add Imdur 30 mg daily to current regimen.

## 2017-05-12 ENCOUNTER — Telehealth: Payer: Self-pay | Admitting: Cardiology

## 2017-05-12 MED ORDER — ISOSORBIDE MONONITRATE ER 30 MG PO TB24: 30.0000 mg | ORAL_TABLET | Freq: Every day | ORAL | 6 refills | Status: DC

## 2017-05-12 NOTE — Telephone Encounter (Signed)
Returned pt call. No answer, left message for pt. To return call.

## 2017-05-12 NOTE — Telephone Encounter (Signed)
lvm returning [name]'s call from yesterday

## 2017-05-12 NOTE — Addendum Note (Signed)
Addended by: Levonne Hubert on: 05/12/2017 10:06 AM   Modules accepted: Orders

## 2017-05-13 NOTE — Telephone Encounter (Signed)
Patient was informed already about adding imdur.

## 2017-06-01 ENCOUNTER — Telehealth: Payer: Self-pay | Admitting: Cardiology

## 2017-06-01 NOTE — Telephone Encounter (Signed)
Returned pt call. She stated that her blood pressure is varying a lot during the day. She stated that she may take her blood pressure 10 times daily at sporadic times.   (175/84, 184/73, 154/62, 164/82, 176/76, 175/82) are just the readings  from yesterday. She is wondering if that was normal. She stated that since adding the last blood pressure medication (Hydralazine) she has not noticed that it was not any lower. I advised her to only check her blood pressure once daily about an hour after taking her medication, rather than taking it at sporadic times throughout the day. She voiced understanding. I told her I would let Dr. [name] know her blood pressure readings.

## 2017-06-01 NOTE — Telephone Encounter (Signed)
Please call patient regarding BP issues / tg

## 2017-06-02 MED ORDER — ISOSORBIDE MONONITRATE ER 30 MG PO TB24: 30.0000 mg | ORAL_TABLET | Freq: Two times a day (BID) | ORAL | 6 refills | Status: DC

## 2017-06-02 NOTE — Telephone Encounter (Signed)
Spoke with patient. Advised her of medication change (Imdur 30 mg -BID), she voiced understanding. Sent in new RX to pharmacy to reflect change. Also made sure she was taking all medications correctly, and she was taking all of them as prescribed. She stated she will take blood pressure only once daily starting today and she would let us know what they are next week .

## 2017-06-02 NOTE — Telephone Encounter (Signed)
Last medication addition was Imdur 30 mg daily. Please increase Imdur to 30 mg twice daily. This goes along with her high-dose hydralazine which was already in place, and also Norvasc.

## 2017-06-10 ENCOUNTER — Telehealth: Payer: Self-pay | Admitting: Cardiology

## 2017-06-10 NOTE — Telephone Encounter (Signed)
Pt. Called to inform us of her blood pressures since being on the Imdur 30- bid. (100/42, 159/66, 149/61, 155/66, 174/70) She stated that she has an appointment 9/12. Will forward to Dr. [name] as an FYI:

## 2017-06-10 NOTE — Telephone Encounter (Signed)
Patient states that her BP has not improved since medication change / tg

## 2017-06-15 NOTE — Progress Notes (Signed)
Cardiology Office Note  Date: 06/16/2017   ID: LYRICAL SOWLE, DOB [birth year], MRN [id number]  PCP: Jani Gravel, MD  Primary Cardiologist: Rozann Lesches, MD   Chief Complaint  Patient presents with  . Cardiomyopathy    History of Present Illness: RAILYN HOUSE is a 71 y.o. female last seen in March. She presents for a follow-up visit. Over the last month blood pressure trend has been up and Imdur was increased to 30 mg twice daily. Today I went over her home blood pressure checks, still not optimal blood pressure control. Her blood pressure does tend to come down after her morning medications but then trends back up in the afternoons. We went over her dosing schedule and made some consolidations, spreading out regimen more evenly.  She reports NYHA class II dyspnea with low-level activity. No palpitations or syncope. No orthopnea or PND. She reports relatively stable weights within a few pounds. She has had right lower leg edema.  She is following with Dr. Lowanda Foster. I am requesting her follow-up BMET from within the last month. Prior creatinine had worsened to 2.7 and we reduced her Demadex dose.  Last echocardiogram was in March 2017 and at that point LVEF had improved to the range of 45-50%. We discussed obtaining a follow-up study.  Past Medical History:  Diagnosis Date  . Anemia   . Anxiety   . Chronic systolic heart failure ([place])   . Coronary atherosclerosis of native coronary artery    Nonobstructive  . Depression   . Diabetes mellitus, type 2 ([place])   . Essential hypertension, benign   . MRSA bacteremia January 2015   Associated with probable tricuspid valve vegetation, ICD system extracted February 2015  . Nonischemic cardiomyopathy (HCC)    LVEF 20% up to 45%  . PAD (peripheral artery disease) (H[facility])    Left foot gangrene    Past Surgical History:  Procedure Laterality Date  . [place] VITRECTOMY WITH 20 GAUGE MVR PORT FOR MACULAR HOLE Right  11/10/2012   Procedure: 25 GAUGE PARS PLANA VITRECTOMY WITH 20 GAUGE MVR PORT FOR MACULAR HOLE;  Surgeon: Hayden Pedro, MD;  Location: [facility];  Service: Ophthalmology;  Laterality: Right;  . 25 GAUGE PARS PLANA VITRECTOMY WITH 20 GAUGE MVR PORT FOR MACULAR HOLE Right 05/09/2013   Procedure: 25 GAUGE PARS PLANA VITRECTOMY WITH 20 GAUGE MVR PORT FOR MACULAR HOLE;  Surgeon: Hayden Pedro, MD;  Location: [facility];  Service: Ophthalmology;  Laterality: Right;  . ABDOMINAL HYSTERECTOMY    . AMPUTATION Left 03/23/2014   Procedure: Left Foot 1st Ray Amputation;  Surgeon: Newt Minion, MD;  Location: [place];  Service: Orthopedics;  Laterality: Left;  Left Foot 1st Ray Amputation  . AMPUTATION Left 04/13/2014   Procedure: AMPUTATION BELOW KNEE;  Surgeon: Newt Minion, MD;  Location: [place];  Service: Orthopedics;  Laterality: Left;  Left Below Knee Amputation  . BLADDER SURGERY  2011 BENIGN MASS REMOVED FROM BLADDER  . BREAST LUMPECTOMY     Bil  . CARDIAC DEFIBRILLATOR PLACEMENT     Medtronic [id number]   . CENTRAL VENOUS CATHETER INSERTION Right 11/09/2013   Procedure: INSERTION CENTRAL LINE ADULT;  Surgeon: Evans Lance, MD;  Location: [place];  Service: Cardiovascular;  Laterality: Right;  . Exploratory laparotomy with lysis of adhesions    . EYE SURGERY  BILATERAL CATARACTS REMOVAL  10 YEARS AGO PER PATIENT  . GAS INSERTION Right 11/10/2012   Procedure: INSERTION  OF GAS;  Surgeon: Hayden Pedro, MD;  Location: [place];  Service: Ophthalmology;  Laterality: Right;  C3F8  . GAS/FLUID EXCHANGE Right 05/09/2013   Procedure: GAS/FLUID EXCHANGE;  Surgeon: Hayden Pedro, MD;  Location: [place];  Service: Ophthalmology;  Laterality: Right;  C3F8   . ICD LEAD REMOVAL N/A 11/09/2013   Procedure: ICD LEAD REMOVAL;  Surgeon: Evans Lance, MD;  Location: [place];  Service: Cardiovascular;  Laterality: N/A;  . LASER PHOTO ABLATION Right 05/09/2013   Procedure: LASER PHOTO ABLATION;  Surgeon: Hayden Pedro, MD;  Location: [place];  Service: Ophthalmology;  Laterality: Right;  Endolaser   . Left shoulder surgery     Rotator Cuff  . MEMBRANE PEEL Right 05/09/2013   Procedure: MEMBRANE PEEL;  Surgeon: Hayden Pedro, MD;  Location: [place];  Service: Ophthalmology;  Laterality: Right;  . PR VEIN BYPASS GRAFT,AORTO-FEM-POP  05-2011   Left popliteal- PTA graft   . SERUM PATCH Right 11/10/2012   Procedure: SERUM PATCH;  Surgeon: Hayden Pedro, MD;  Location: [place];  Service: Ophthalmology;  Laterality: Right;  . SERUM PATCH Right 05/09/2013   Procedure: SERUM PATCH;  Surgeon: Hayden Pedro, MD;  Location: [place];  Service: Ophthalmology;  Laterality: Right;  . STUMP REVISION Left 05/30/2014   Procedure: Left Below Knee Amputation Revision;  Surgeon: Newt Minion, MD;  Location: [place];  Service: Orthopedics;  Laterality: Left;  . TOE AMPUTATION Left    great toe  . Viterectomy  11/10/2012   OD   Dr Zigmund Daniel    Current Outpatient Prescriptions  Medication Sig Dispense Refill  . ALPRAZolam (XANAX) 0.5 MG tablet Take one tablet by mouth twice daily as needed for anxiety    . amLODipine (NORVASC) 10 MG tablet TAKE ONE TABLET BY MOUTH ONCE DAILY 90 tablet 2  . amLODipine (NORVASC) 10 MG tablet TAKE ONE TABLET BY MOUTH ONCE DAILY 90 tablet 3  . aspirin 81 MG tablet Take 81 mg by mouth daily.     . calcitRIOL (ROCALTROL) 0.25 MCG capsule 0.25 mcg daily.     . carvedilol (COREG) 25 MG tablet Take 1 tablet (25 mg total) by mouth 2 (two) times daily with a meal. 60 tablet 6  . Cholecalciferol (VITAMIN D-3) 1000 UNITS CAPS Take 1,000 Units by mouth daily.    Marland Kitchen docusate sodium (COLACE) 100 MG capsule Take 100 mg by mouth daily as needed (constipation).     . hydrALAZINE (APRESOLINE) 100 MG tablet Take 1 tablet (100 mg total) by mouth 3 (three) times daily. 90 tablet 11  . hydrALAZINE (APRESOLINE) 100 MG tablet TAKE ONE TABLET BY MOUTH THREE TIMES DAILY 270 tablet 1  . insulin glargine (LANTUS) 100 UNIT/ML injection Inject 30 Units  into the skin daily.     . insulin lispro (HUMALOG) 100 UNIT/ML injection Inject 0-8 Units into the skin every evening. Sliding scale: 200-249=2 units, 250-299=4 units, 300-349=6 units, >350=8 units    . isosorbide mononitrate (IMDUR) 30 MG 24 hr tablet Take 1 tablet (30 mg total) by mouth 2 (two) times daily. 60 tablet 6  . nitroGLYCERIN (NITROSTAT) 0.4 MG SL tablet Place 1 tablet (0.4 mg total) under the tongue every 5 (five) minutes as needed for chest pain. 25 tablet 3  . polyethylene glycol (MIRALAX / GLYCOLAX) packet Take 17 g by mouth daily as needed (constipation).     . sodium bicarbonate 650 MG tablet Take 650 mg by mouth 3 (three)  times daily.    Marland Kitchen torsemide (DEMADEX) 20 MG tablet Take 60 mg (3 tablets) twice a day 180 tablet 3  . cloNIDine (CATAPRES) 0.1 MG tablet Take 0.1 mg daily as needed for systolic blood pressure greater than 180. 60 tablet 11   No current facility-administered medications for this visit.    Allergies:  Hydrocodone-acetaminophen   Social History: The patient  reports that she has never smoked. She has never used smokeless tobacco. She reports that she does not drink alcohol or use drugs.   ROS:  Please see the history of present illness. Otherwise, complete review of systems is positive for intermittent right leg swelling, uses left lower leg prosthesis and also walker to ambulate.  All other systems are reviewed and negative.   Physical Exam: VS:  BP (!) 158/64   Pulse (!) 58   Ht 5\' 1"  (1.549 m)   Wt 193 lb (87.5 kg)   SpO2 95%   BMI 36.47 kg/m , BMI Body mass index is 36.47 kg/m.  Wt Readings from Last 3 Encounters:  06/16/17 193 lb (87.5 kg)  12/07/16 190 lb (86.2 kg)  05/25/16 190 lb (86.2 kg)    Gen.: Obese woman, no distress. Using walker. HEENT: Conjunctiva and lids normal, oropharynx clear. Neck: Supple, no elevated JVP or carotid bruits, no thyromegaly. Lungs: Clear to auscultation, no wheezing or rhonchi, nonlabored breathing at  rest. Cardiac: Regular rate and rhythm, no S3, 2/6 systolic murmur, no pericardial rub. Abdomen: Obese, nontender, bowel sounds present, no guarding or rebound. Extremities: 1-2+ right lower leg edema, prosthesis in place on left status post BKA.. Skin: Warm and dry. Musculoskeletal: No kyphosis. Neuropsychiatric: Alert and oriented x3, affect grossly appropriate.   ECG: I personally reviewed the tracing from 12/07/2016 which showed sinus rhythm with left bundle-branch block.  Recent Labwork: 11/30/2016: BUN 58; Creat 2.71; Potassium 4.1; Sodium 141     Component Value Date/Time   CHOL 185 01/17/2010 2004   TRIG 51 01/17/2010 2004   HDL 67 01/17/2010 2004   CHOLHDL 2.8 Ratio 01/17/2010 2004   VLDL 10 01/17/2010 2004   LDLCALC 108 (H) 01/17/2010 2004    Other Studies Reviewed Today:  Echocardiogram 12/10/2015: Study Conclusions  - Left ventricle: The cavity size was normal. Wall thickness was   increased in a pattern of moderate LVH. Systolic function was   mildly reduced. The estimated ejection fraction was in the range   of 45% to 50%. Wall motion was normal; there were no regional   wall motion abnormalities. Doppler parameters are consistent with   abnormal left ventricular relaxation (grade 1 diastolic   dysfunction). - Aortic valve: Mildly calcified annulus. Trileaflet; mildly   thickened leaflets. Valve area (VTI): 1.41 cm^2. Valve area   (Vmax): 1.43 cm^2. - Mitral valve: Mildly calcified annulus. Mildly thickened leaflets   . There was mild regurgitation. - Left atrium: The atrium was mildly dilated. - Atrial septum: No defect or patent foramen ovale was identified. - Pulmonary arteries: Systolic pressure was moderately increased.   PA peak pressure: 53 mm Hg (S). - Technically adequate study.  Assessment and Plan:  1. Chronic combined heart failure. Weight is up a few pounds and she does have right leg edema. Demadex dose was cut back earlier this year due to  progressive renal insufficiency and at this point we do not plan to advance diuretics. Obtain recent blood work from Dr. Lowanda Foster to follow-up on renal function prior to considering any changes in diuretics.  2. Nonischemic cardiomyopathy, LVEF 45-50% as of March 2017. Follow-up echocardiogram will be obtained to reassess LVEF and exclude deterioration.  3. Essential hypertension, blood pressure control suboptimal. As noted above we did consolidate some of her dosing regimen with better spread throughout the day. Also given prescription for clonidine 0.1 mg to be taken if systolic blood pressure spikes over 180s. May need to add standing dose. She is not on ACE inhibitor or ARB due to renal insufficiency.  4. CKD stage 3, last creatinine 2.7. Obtaining follow-up lab work.  Current medicines were reviewed with the patient today.   Orders Placed This Encounter  Procedures  . ECHOCARDIOGRAM COMPLETE    Disposition: Follow-up in one month.  Signed, Satira Sark, MD, [facility] 06/16/2017 9:08 AM    [place] at [facility] [address], [place], [place] [address] Phone: [phone]; Fax: [phone]

## 2017-06-16 ENCOUNTER — Encounter: Payer: Self-pay | Admitting: Cardiology

## 2017-06-16 ENCOUNTER — Ambulatory Visit (INDEPENDENT_AMBULATORY_CARE_PROVIDER_SITE_OTHER): Payer: Medicare Other | Admitting: Cardiology

## 2017-06-16 VITALS — BP 158/64 | HR 58 | Ht 61.0 in | Wt 193.0 lb

## 2017-06-16 DIAGNOSIS — N183 Chronic kidney disease, stage 3 unspecified: Secondary | ICD-10-CM

## 2017-06-16 DIAGNOSIS — I10 Essential (primary) hypertension: Secondary | ICD-10-CM | POA: Diagnosis not present

## 2017-06-16 DIAGNOSIS — I428 Other cardiomyopathies: Secondary | ICD-10-CM | POA: Diagnosis not present

## 2017-06-16 DIAGNOSIS — I5042 Chronic combined systolic (congestive) and diastolic (congestive) heart failure: Secondary | ICD-10-CM

## 2017-06-16 MED ORDER — CLONIDINE HCL 0.1 MG PO TABS: ORAL_TABLET | ORAL | 11 refills | Status: DC

## 2017-06-16 NOTE — Patient Instructions (Signed)
Medication Instructions:  Take clonidine 0.1 mg daily AS NEEDED for systolic blood pressure greater than 180.   Labwork: I will request a copy of labs from PCP  Testing/Procedures: Your physician has requested that you have an echocardiogram. Echocardiography is a painless test that uses sound waves to create images of your heart. It provides your doctor with information about the size and shape of your heart and how well your heart's chambers and valves are working. This procedure takes approximately one hour. There are no restrictions for this procedure.    Follow-Up: Your physician recommends that you schedule a follow-up appointment in: 1 MONTH   Any Other Special Instructions Will Be Listed Below (If Applicable).     If you need a refill on your cardiac medications before your next appointment, please call your pharmacy.

## 2017-06-28 ENCOUNTER — Ambulatory Visit (HOSPITAL_COMMUNITY)
Admission: RE | Admit: 2017-06-28 | Discharge: 2017-06-28 | Disposition: A | Discharge status: Home / Self Care | Payer: Medicare Other | Source: Ambulatory Visit | Attending: Cardiology | Admitting: Cardiology

## 2017-06-28 DIAGNOSIS — I428 Other cardiomyopathies: Secondary | ICD-10-CM | POA: Insufficient documentation

## 2017-06-28 DIAGNOSIS — E785 Hyperlipidemia, unspecified: Secondary | ICD-10-CM | POA: Insufficient documentation

## 2017-06-28 DIAGNOSIS — I10 Essential (primary) hypertension: Secondary | ICD-10-CM | POA: Diagnosis not present

## 2017-06-28 DIAGNOSIS — E119 Type 2 diabetes mellitus without complications: Secondary | ICD-10-CM | POA: Diagnosis not present

## 2017-06-28 DIAGNOSIS — I350 Nonrheumatic aortic (valve) stenosis: Secondary | ICD-10-CM | POA: Diagnosis not present

## 2017-06-28 LAB — ECHOCARDIOGRAM COMPLETE
AO mean calculated velocity dopler: 126 cm/s
AOPV: 0.52 m/s
AV Area VTI index: 0.7 cm2/m2
AV Area VTI: 1.33 cm2
AV Mean grad: 8 mmHg
AV Peak grad: 15 mmHg
AV peak Index: 0.67
AVAREAMEANV: 1.45 cm2
AVAREAMEANVIN: 0.73 cm2/m2
AVCELMEANRAT: 0.57
AVLVOTPG: 4 mmHg
AVPKVEL: 195 cm/s
CHL CUP AV VALUE AREA INDEX: 0.7
CHL CUP AV VEL: 1.38
CHL CUP DOP CALC LVOT VTI: 29.1 cm
CHL CUP MV DEC (S): 310
CHL CUP RV SYS PRESS: 47 mmHg
CHL CUP TV REG PEAK VELOCITY: 331 cm/s
E decel time: 310 msec
EERAT: 10.92
FS: 26 % — AB (ref 28–44)
IVS/LV PW RATIO, ED: 0.99
LA diam end sys: 41 mm
LA vol index: 36.8 mL/m2
LA vol: 73.1 mL
LADIAMINDEX: 2.07 cm/m2
LASIZE: 41 mm
LAVOLA4C: 80.5 mL
LV E/e' medial: 10.92
LV E/e'average: 10.92
LV PW d: 13.3 mm — AB (ref 0.6–1.1)
LV dias vol index: 47 mL/m2
LV sys vol: 45 mL — AB
LVDIAVOL: 93 mL (ref 46–106)
LVELAT: 7.29 cm/s
LVOT area: 2.54 cm2
LVOT diameter: 18 mm
LVOTPV: 102 cm/s
LVOTSV: 74 mL
LVOTVTI: 0.54 cm
LVSYSVOLIN: 22 mL/m2
MV Peak grad: 3 mmHg
MV pk E vel: 79.6 m/s
MVPKAVEL: 104 m/s
RV LATERAL S' VELOCITY: 8.92 cm/s
Simpson's disk: 52
Stroke v: 48 ml
TAPSE: 23.5 mm
TDI e' lateral: 7.29
TDI e' medial: 5.22
TR max vel: 331 cm/s
VTI: 53.7 cm
Valve area: 1.38 cm2

## 2017-06-28 NOTE — Progress Notes (Signed)
*  PRELIMINARY RESULTS* Echocardiogram 2D Echocardiogram has been performed.  [name] 06/28/2017, 10:16 AM

## 2017-06-30 ENCOUNTER — Other Ambulatory Visit: Payer: Self-pay | Admitting: Cardiology

## 2017-07-26 NOTE — Progress Notes (Signed)
Cardiology Office Note  Date: 07/27/2017   ID: [name], DOB Sep 25, 1946, MRN [id number]  PCP: Jani Gravel, MD  Primary Cardiologist: Rozann Lesches, MD   Chief Complaint  Patient presents with  . Cardiomyopathy    History of Present Illness: [name] is a 71 y.o. female last seen in September. She presents for a follow-up visit. Reports no decline in stamina or worsening shortness of breath. Her weights have been stable. She has used clonidine only a few times for systolics over 053. We have decided to keep this when necessary for now.  I reviewed her antihypertensive regimen which is outlined below. Plan is to continue with current dosing strategy. She states that she has had interval lab work which we are requesting.  Recent follow-up echocardiogram showed LVEF 50-55% range with grade 1 diastolic dysfunction, mild aortic stenosis, severe left atrial enlargement, and PASP 47 mmHg. I discussed the results with her today.  Past Medical History:  Diagnosis Date  . Anemia   . Anxiety   . Chronic systolic heart failure ([place])   . Coronary atherosclerosis of native coronary artery    Nonobstructive  . Depression   . Diabetes mellitus, type 2 ([place])   . Essential hypertension, benign   . MRSA bacteremia January 2015   Associated with probable tricuspid valve vegetation, ICD system extracted February 2015  . Nonischemic cardiomyopathy (HCC)    LVEF 20% up to 45%  . PAD (peripheral artery disease) (HCC)    Left foot gangrene    Past Surgical History:  Procedure Laterality Date  . [place] VITRECTOMY WITH 20 GAUGE MVR PORT FOR MACULAR HOLE Right 11/10/2012   Procedure: 25 GAUGE PARS PLANA VITRECTOMY WITH 20 GAUGE MVR PORT FOR MACULAR HOLE;  Surgeon: Hayden Pedro, MD;  Location: [place];  Service: Ophthalmology;  Laterality: Right;  . 25 GAUGE PARS PLANA VITRECTOMY WITH 20 GAUGE MVR PORT FOR MACULAR HOLE Right 05/09/2013   Procedure: 25 GAUGE PARS PLANA  VITRECTOMY WITH 20 GAUGE MVR PORT FOR MACULAR HOLE;  Surgeon: Hayden Pedro, MD;  Location: [place];  Service: Ophthalmology;  Laterality: Right;  . ABDOMINAL HYSTERECTOMY    . AMPUTATION Left 03/23/2014   Procedure: Left Foot 1st Ray Amputation;  Surgeon: Newt Minion, MD;  Location: [place];  Service: Orthopedics;  Laterality: Left;  Left Foot 1st Ray Amputation  . AMPUTATION Left 04/13/2014   Procedure: AMPUTATION BELOW KNEE;  Surgeon: Newt Minion, MD;  Location: [place];  Service: Orthopedics;  Laterality: Left;  Left Below Knee Amputation  . BLADDER SURGERY  2011 BENIGN MASS REMOVED FROM BLADDER  . BREAST LUMPECTOMY     Bil  . CARDIAC DEFIBRILLATOR PLACEMENT     Medtronic [id number]   . CENTRAL VENOUS CATHETER INSERTION Right 11/09/2013   Procedure: INSERTION CENTRAL LINE ADULT;  Surgeon: Evans Lance, MD;  Location: [facility];  Service: Cardiovascular;  Laterality: Right;  . Exploratory laparotomy with lysis of adhesions    . EYE SURGERY  BILATERAL CATARACTS REMOVAL  10 YEARS AGO PER PATIENT  . GAS INSERTION Right 11/10/2012   Procedure: INSERTION OF GAS;  Surgeon: Hayden Pedro, MD;  Location: [place];  Service: Ophthalmology;  Laterality: Right;  C3F8  . GAS/FLUID EXCHANGE Right 05/09/2013   Procedure: GAS/FLUID EXCHANGE;  Surgeon: Hayden Pedro, MD;  Location: [place];  Service: Ophthalmology;  Laterality: Right;  C3F8   . ICD LEAD REMOVAL N/A 11/09/2013  Procedure: ICD LEAD REMOVAL;  Surgeon: Evans Lance, MD;  Location: [facility];  Service: Cardiovascular;  Laterality: N/A;  . LASER PHOTO ABLATION Right 05/09/2013   Procedure: LASER PHOTO ABLATION;  Surgeon: Hayden Pedro, MD;  Location: [facility];  Service: Ophthalmology;  Laterality: Right;  Endolaser   . Left shoulder surgery     Rotator Cuff  . MEMBRANE PEEL Right 05/09/2013   Procedure: MEMBRANE PEEL;  Surgeon: Hayden Pedro, MD;  Location: [place];  Service: Ophthalmology;  Laterality: Right;  . PR VEIN BYPASS GRAFT,AORTO-FEM-POP  05-2011    Left popliteal- PTA graft   . SERUM PATCH Right 11/10/2012   Procedure: SERUM PATCH;  Surgeon: Hayden Pedro, MD;  Location: [facility];  Service: Ophthalmology;  Laterality: Right;  . SERUM PATCH Right 05/09/2013   Procedure: SERUM PATCH;  Surgeon: Hayden Pedro, MD;  Location: [facility];  Service: Ophthalmology;  Laterality: Right;  . STUMP REVISION Left 05/30/2014   Procedure: Left Below Knee Amputation Revision;  Surgeon: Newt Minion, MD;  Location: [place];  Service: Orthopedics;  Laterality: Left;  . TOE AMPUTATION Left    great toe  . Viterectomy  11/10/2012   OD   Dr Zigmund Daniel    Current Outpatient Prescriptions  Medication Sig Dispense Refill  . ALPRAZolam (XANAX) 0.5 MG tablet Take one tablet by mouth twice daily as needed for anxiety    . amLODipine (NORVASC) 10 MG tablet TAKE ONE TABLET BY MOUTH ONCE DAILY 90 tablet 2  . aspirin 81 MG tablet Take 81 mg by mouth daily.     . calcitRIOL (ROCALTROL) 0.25 MCG capsule 0.25 mcg daily.     . carvedilol (COREG) 25 MG tablet TAKE ONE TABLET BY MOUTH TWICE DAILY WITH A MEAL 60 tablet 6  . Cholecalciferol (VITAMIN D-3) 1000 UNITS CAPS Take 1,000 Units by mouth daily.    . cloNIDine (CATAPRES) 0.1 MG tablet Take 0.1 mg daily as needed for systolic blood pressure greater than 180. 60 tablet 11  . docusate sodium (COLACE) 100 MG capsule Take 100 mg by mouth daily as needed (constipation).     . hydrALAZINE (APRESOLINE) 100 MG tablet Take 1 tablet (100 mg total) by mouth 3 (three) times daily. 90 tablet 11  . Insulin Glargine (BASAGLAR KWIKPEN) 100 UNIT/ML SOPN Inject 30 Units into the skin at bedtime.    . insulin lispro (HUMALOG) 100 UNIT/ML injection Inject 0-8 Units into the skin every evening. Sliding scale: 200-249=2 units, 250-299=4 units, 300-349=6 units, >350=8 units    . isosorbide mononitrate (IMDUR) 30 MG 24 hr tablet Take 1 tablet (30 mg total) by mouth 2 (two) times daily. 60 tablet 6  . nitroGLYCERIN (NITROSTAT) 0.4 MG SL tablet Place 1  tablet (0.4 mg total) under the tongue every 5 (five) minutes as needed for chest pain. 25 tablet 3  . polyethylene glycol (MIRALAX / GLYCOLAX) packet Take 17 g by mouth daily as needed (constipation).     . sodium bicarbonate 650 MG tablet Take 650 mg by mouth 3 (three) times daily.    Marland Kitchen torsemide (DEMADEX) 20 MG tablet Take 60 mg (3 tablets) twice a day 180 tablet 3   No current facility-administered medications for this visit.    Allergies:  Hydrocodone-acetaminophen   Social History: The patient  reports that she has never smoked. She has never used smokeless tobacco. She reports that she does not drink alcohol or use drugs.   ROS:  Please see the history  of present illness. Otherwise, complete review of systems is positive for intermittent anxiety.  All other systems are reviewed and negative.   Physical Exam: VS:  BP (!) 148/60   Pulse (!) 59   Ht 5\' 1"  (1.549 m)   Wt 192 lb (87.1 kg)   SpO2 96%   BMI 36.28 kg/m , BMI Body mass index is 36.28 kg/m.  Wt Readings from Last 3 Encounters:  07/27/17 192 lb (87.1 kg)  06/16/17 193 lb (87.5 kg)  12/07/16 190 lb (86.2 kg)    General: Obese woman, appears comfortable at rest. Using a walker. HEENT: Conjunctiva and lids normal, oropharynx clear. Neck: Supple, no elevated JVP or carotid bruits, no thyromegaly. Lungs: Clear to auscultation, nonlabored breathing at rest. Cardiac: Regular rate and rhythm, no S3, 2/6 systolic murmur, no pericardial rub. Abdomen: Soft, nontender, bowel sounds present. Extremities: 1+ right leg edema, left leg prosthesis in place status post BKA. Skin: Warm and dry. Musculoskeletal: No kyphosis. Neuropsychiatric: Alert and oriented x3, affect grossly appropriate.  ECG: I personally reviewed the tracing from 12/07/2016 which shows sinus rhythm with left bundle branch block.  Recent Labwork: 11/30/2016: BUN 58; Creat 2.71; Potassium 4.1; Sodium 141     Component Value Date/Time   CHOL 185 01/17/2010 2004    TRIG 51 01/17/2010 2004   HDL 67 01/17/2010 2004   CHOLHDL 2.8 Ratio 01/17/2010 2004   VLDL 10 01/17/2010 2004   LDLCALC 108 (H) 01/17/2010 2004  August 2018: BUN 49, creatinine 2.7, potassium 4.1    Other Studies Reviewed Today:  Echocardiogram 06/28/2017: Study Conclusions  - Left ventricle: The cavity size was normal. Wall thickness was   increased in a pattern of moderate LVH. Systolic function was   normal. The estimated ejection fraction was in the range of 50%   to 55%. Wall motion was normal; there were no regional wall   motion abnormalities. Doppler parameters are consistent with   abnormal left ventricular relaxation (grade 1 diastolic   dysfunction). - Aortic valve: Mildly calcified annulus. Trileaflet; moderately   thickened leaflets. There was mild stenosis. Mean gradient (S): 8   mm Hg. Valve area (VTI): 1.7 cm^2. Valve area (Vmax): 1.64 cm^2.   Valve area (Vmean): 1.79 cm^2. - Mitral valve: Mildly calcified annulus. Normal thickness leaflets. - Left atrium: The atrium was severely dilated. - Atrial septum: No defect or patent foramen ovale was identified. - Pulmonary arteries: Systolic pressure was moderately increased.   PA peak pressure: 47 mm Hg (S). - Technically adequate study.  Assessment and Plan:  1. Nonischemic cardiomyopathy with improvement in LVEF to the range of 50-55% on medical therapy. Recent echocardiogram reviewed.  2. Chronic diastolic heart failure, management, located by chronic kidney disease. Plan to continue current dose of Demadex. We might consider adding as needed metolazone depending on fluid status. Requesting recent lab work from nephrology.  3. Essential hypertension, blood pressure has been up and down but trend looks better. Continue with present medications and as needed clonidine.  4. CKD stage 3-4, creatinine 2.7 in August.  Current medicines were reviewed with the patient today.  Disposition: Follow-up in 3  months.  Signed, Satira Sark, MD, [facility] 07/27/2017 9:04 AM    [facility] at [facility] [address], [place], [place] [address] Phone: [phone]; Fax: [phone]

## 2017-07-27 ENCOUNTER — Ambulatory Visit (INDEPENDENT_AMBULATORY_CARE_PROVIDER_SITE_OTHER): Payer: Medicare Other | Admitting: Cardiology

## 2017-07-27 ENCOUNTER — Encounter: Payer: Self-pay | Admitting: Cardiology

## 2017-07-27 VITALS — BP 148/60 | HR 59 | Ht 61.0 in | Wt 192.0 lb

## 2017-07-27 DIAGNOSIS — I10 Essential (primary) hypertension: Secondary | ICD-10-CM

## 2017-07-27 DIAGNOSIS — N183 Chronic kidney disease, stage 3 unspecified: Secondary | ICD-10-CM

## 2017-07-27 DIAGNOSIS — I5032 Chronic diastolic (congestive) heart failure: Secondary | ICD-10-CM | POA: Diagnosis not present

## 2017-07-27 DIAGNOSIS — I428 Other cardiomyopathies: Secondary | ICD-10-CM | POA: Diagnosis not present

## 2017-07-27 NOTE — Patient Instructions (Signed)
Your physician recommends that you schedule a follow-up appointment in:  3 months with [name]    Your physician recommends that you continue on your current medications as directed. Please refer to the Current Medication list given to you today.    If you need a refill on your cardiac medications before your next appointment, please call your pharmacy.    No lab work or testing ordered today.    We have requested lab work from your pcp and nephrologist.       Thank you for choosing [facility] !

## 2017-08-09 ENCOUNTER — Ambulatory Visit (INDEPENDENT_AMBULATORY_CARE_PROVIDER_SITE_OTHER): Payer: Medicare Other | Admitting: Ophthalmology

## 2017-08-09 DIAGNOSIS — H43812 Vitreous degeneration, left eye: Secondary | ICD-10-CM | POA: Diagnosis not present

## 2017-08-09 DIAGNOSIS — H35033 Hypertensive retinopathy, bilateral: Secondary | ICD-10-CM

## 2017-08-09 DIAGNOSIS — H35341 Macular cyst, hole, or pseudohole, right eye: Secondary | ICD-10-CM | POA: Diagnosis not present

## 2017-08-09 DIAGNOSIS — E11311 Type 2 diabetes mellitus with unspecified diabetic retinopathy with macular edema: Secondary | ICD-10-CM

## 2017-08-09 DIAGNOSIS — E113511 Type 2 diabetes mellitus with proliferative diabetic retinopathy with macular edema, right eye: Secondary | ICD-10-CM

## 2017-08-09 DIAGNOSIS — E113592 Type 2 diabetes mellitus with proliferative diabetic retinopathy without macular edema, left eye: Secondary | ICD-10-CM | POA: Diagnosis not present

## 2017-08-09 DIAGNOSIS — I10 Essential (primary) hypertension: Secondary | ICD-10-CM

## 2017-09-14 ENCOUNTER — Other Ambulatory Visit: Payer: Self-pay | Admitting: Cardiology

## 2017-10-11 DIAGNOSIS — I10 Essential (primary) hypertension: Secondary | ICD-10-CM | POA: Diagnosis not present

## 2017-10-11 DIAGNOSIS — Z89512 Acquired absence of left leg below knee: Secondary | ICD-10-CM | POA: Diagnosis not present

## 2017-10-11 DIAGNOSIS — N184 Chronic kidney disease, stage 4 (severe): Secondary | ICD-10-CM | POA: Diagnosis not present

## 2017-10-11 DIAGNOSIS — E785 Hyperlipidemia, unspecified: Secondary | ICD-10-CM | POA: Diagnosis not present

## 2017-10-11 DIAGNOSIS — R69 Illness, unspecified: Secondary | ICD-10-CM | POA: Diagnosis not present

## 2017-10-11 DIAGNOSIS — Z794 Long term (current) use of insulin: Secondary | ICD-10-CM | POA: Diagnosis not present

## 2017-10-11 DIAGNOSIS — Z6835 Body mass index (BMI) 35.0-35.9, adult: Secondary | ICD-10-CM | POA: Diagnosis not present

## 2017-10-11 DIAGNOSIS — Z79899 Other long term (current) drug therapy: Secondary | ICD-10-CM | POA: Diagnosis not present

## 2017-10-11 DIAGNOSIS — I129 Hypertensive chronic kidney disease with stage 1 through stage 4 chronic kidney disease, or unspecified chronic kidney disease: Secondary | ICD-10-CM | POA: Diagnosis not present

## 2017-10-11 DIAGNOSIS — E119 Type 2 diabetes mellitus without complications: Secondary | ICD-10-CM | POA: Diagnosis not present

## 2017-10-12 DIAGNOSIS — R809 Proteinuria, unspecified: Secondary | ICD-10-CM | POA: Diagnosis not present

## 2017-10-12 DIAGNOSIS — I509 Heart failure, unspecified: Secondary | ICD-10-CM | POA: Diagnosis not present

## 2017-10-12 DIAGNOSIS — E1129 Type 2 diabetes mellitus with other diabetic kidney complication: Secondary | ICD-10-CM | POA: Diagnosis not present

## 2017-10-12 DIAGNOSIS — I10 Essential (primary) hypertension: Secondary | ICD-10-CM | POA: Diagnosis not present

## 2017-10-12 DIAGNOSIS — N184 Chronic kidney disease, stage 4 (severe): Secondary | ICD-10-CM | POA: Diagnosis not present

## 2017-10-12 DIAGNOSIS — D638 Anemia in other chronic diseases classified elsewhere: Secondary | ICD-10-CM | POA: Diagnosis not present

## 2017-10-12 DIAGNOSIS — E872 Acidosis: Secondary | ICD-10-CM | POA: Diagnosis not present

## 2017-10-18 NOTE — Progress Notes (Signed)
Cardiology Office Note  Date: 10/19/2017   ID: [name], DOB 1946/07/24, MRN [id number]  PCP: Jani Gravel, MD  Primary Cardiologist: Rozann Lesches, MD   Chief Complaint  Patient presents with  . Cardiomyopathy    History of Present Illness: [name] is a 72 y.o. female last seen in October 2018. She presents for a routine follow-up visit. States that since last encounter she has had no major fluctuations in weight, is actually down a few pounds today. She has been on stable diuretic regimen and follow-up lab work from December 2018 showed some improvement in renal function with creatinine down to 1.9. She is grieving the loss of a close friend recently. Blood pressure is up today, she admits that she forgot her medications.  We went over her current cardiac medication list as well as home blood pressure checks.  Labwork from September 2018 is outlined below.  Echocardiogram from September 2018 revealed LVEF 50-55% range with grade 1 diastolic dysfunction, PASP moderately elevated at 47 mmHg.  Past Medical History:  Diagnosis Date  . Anemia   . Anxiety   . Chronic systolic heart failure ([place])   . Coronary atherosclerosis of native coronary artery    Nonobstructive  . Depression   . Diabetes mellitus, type 2 ([place])   . Essential hypertension, benign   . MRSA bacteremia January 2015   Associated with probable tricuspid valve vegetation, ICD system extracted February 2015  . Nonischemic cardiomyopathy (HCC)    LVEF 20% up to 45%  . PAD (peripheral artery disease) (HCC)    Left foot gangrene    Past Surgical History:  Procedure Laterality Date  . [place] VITRECTOMY WITH 20 GAUGE MVR PORT FOR MACULAR HOLE Right 11/10/2012   Procedure: 25 GAUGE PARS PLANA VITRECTOMY WITH 20 GAUGE MVR PORT FOR MACULAR HOLE;  Surgeon: Hayden Pedro, MD;  Location: [facility] [place];  Service: Ophthalmology;  Laterality: Right;  . 25 GAUGE PARS PLANA VITRECTOMY WITH 20 GAUGE  MVR PORT FOR MACULAR HOLE Right 05/09/2013   Procedure: 25 GAUGE PARS PLANA VITRECTOMY WITH 20 GAUGE MVR PORT FOR MACULAR HOLE;  Surgeon: Hayden Pedro, MD;  Location: [place];  Service: Ophthalmology;  Laterality: Right;  . ABDOMINAL HYSTERECTOMY    . AMPUTATION Left 03/23/2014   Procedure: Left Foot 1st Ray Amputation;  Surgeon: Newt Minion, MD;  Location: [facility];  Service: Orthopedics;  Laterality: Left;  Left Foot 1st Ray Amputation  . AMPUTATION Left 04/13/2014   Procedure: AMPUTATION BELOW KNEE;  Surgeon: Newt Minion, MD;  Location: [place];  Service: Orthopedics;  Laterality: Left;  Left Below Knee Amputation  . BLADDER SURGERY  2011 BENIGN MASS REMOVED FROM BLADDER  . BREAST LUMPECTOMY     Bil  . CARDIAC DEFIBRILLATOR PLACEMENT     Medtronic [id number]   . CENTRAL VENOUS CATHETER INSERTION Right 11/09/2013   Procedure: INSERTION CENTRAL LINE ADULT;  Surgeon: Evans Lance, MD;  Location: [facility];  Service: Cardiovascular;  Laterality: Right;  . Exploratory laparotomy with lysis of adhesions    . EYE SURGERY  BILATERAL CATARACTS REMOVAL  10 YEARS AGO PER PATIENT  . GAS INSERTION Right 11/10/2012   Procedure: INSERTION OF GAS;  Surgeon: Hayden Pedro, MD;  Location: [place];  Service: Ophthalmology;  Laterality: Right;  C3F8  . GAS/FLUID EXCHANGE Right 05/09/2013   Procedure: GAS/FLUID EXCHANGE;  Surgeon: Hayden Pedro, MD;  Location: [place];  Service: Ophthalmology;  Laterality: Right;  C3F8   . ICD LEAD REMOVAL N/A 11/09/2013   Procedure: ICD LEAD REMOVAL;  Surgeon: Evans Lance, MD;  Location: [place];  Service: Cardiovascular;  Laterality: N/A;  . LASER PHOTO ABLATION Right 05/09/2013   Procedure: LASER PHOTO ABLATION;  Surgeon: [place]den Pedro, MD;  Location: [place];  Service: Ophthalmology;  Laterality: Right;  Endolaser   . Left shoulder surgery     Rotator Cuff  . MEMBRANE PEEL Right 05/09/2013   Procedure: MEMBRANE PEEL;  Surgeon: Hayden Pedro, MD;  Location: [place];  Service:  Ophthalmology;  Laterality: Right;  . PR VEIN BYPASS GRAFT,AORTO-FEM-POP  05-2011   Left popliteal- PTA graft   . SERUM PATCH Right 11/10/2012   Procedure: SERUM PATCH;  Surgeon: Hayden Pedro, MD;  Location: [place];  Service: Ophthalmology;  Laterality: Right;  . SERUM PATCH Right 05/09/2013   Procedure: SERUM PATCH;  Surgeon: Hayden Pedro, MD;  Location: [place];  Service: Ophthalmology;  Laterality: Right;  . STUMP REVISION Left 05/30/2014   Procedure: Left Below Knee Amputation Revision;  Surgeon: Newt Minion, MD;  Location: [place];  Service: Orthopedics;  Laterality: Left;  . TOE AMPUTATION Left    great toe  . Viterectomy  11/10/2012   OD   Dr Zigmund Daniel    Current Outpatient Medications  Medication Sig Dispense Refill  . ALPRAZolam (XANAX) 0.5 MG tablet Take one tablet by mouth twice daily as needed for anxiety    . amLODipine (NORVASC) 10 MG tablet TAKE ONE TABLET BY MOUTH ONCE DAILY 90 tablet 2  . aspirin 81 MG tablet Take 81 mg by mouth daily.     . calcitRIOL (ROCALTROL) 0.25 MCG capsule 0.25 mcg daily.     . carvedilol (COREG) 25 MG tablet TAKE ONE TABLET BY MOUTH TWICE DAILY WITH A MEAL 60 tablet 6  . Cholecalciferol (VITAMIN D-3) 1000 UNITS CAPS Take 1,000 Units by mouth daily.    . cloNIDine (CATAPRES) 0.1 MG tablet Take 0.1 mg daily as needed for systolic blood pressure greater than 180. 60 tablet 11  . docusate sodium (COLACE) 100 MG capsule Take 100 mg by mouth daily as needed (constipation).     . hydrALAZINE (APRESOLINE) 100 MG tablet Take 1 tablet (100 mg total) by mouth 3 (three) times daily. 90 tablet 11  . Insulin Glargine (BASAGLAR KWIKPEN) 100 UNIT/ML SOPN Inject 30 Units into the skin at bedtime.    . insulin lispro (HUMALOG) 100 UNIT/ML injection Inject 0-8 Units into the skin every evening. Sliding scale: 200-249=2 units, 250-299=4 units, 300-349=6 units, >350=8 units    . nitroGLYCERIN (NITROSTAT) 0.4 MG SL tablet Place 1 tablet (0.4 mg total) under the tongue  every 5 (five) minutes as needed for chest pain. 25 tablet 3  . polyethylene glycol (MIRALAX / GLYCOLAX) packet Take 17 g by mouth daily as needed (constipation).     . sodium bicarbonate 650 MG tablet Take 650 mg by mouth 3 (three) times daily.    Marland Kitchen torsemide (DEMADEX) 20 MG tablet Take 3 tablets (60 mg total) by mouth 2 (two) times daily. 540 tablet 3  . isosorbide mononitrate (IMDUR) 30 MG 24 hr tablet Take 1 tablet (30 mg total) by mouth 2 (two) times daily. 60 tablet 6   No current facility-administered medications for this visit.    Allergies:  Hydrocodone-acetaminophen   Social History: The patient  reports that  has never smoked. she has never used smokeless tobacco. She  reports that she does not drink alcohol or use drugs.   ROS:  Please see the history of present illness. Otherwise, complete review of systems is positive for ambulatory limitations with leg prosthesis, uses a walker.  All other systems are reviewed and negative.   Physical Exam: VS:  BP (!) 182/68 Comment: took clonidine around 8 am this morning  Pulse 60   Ht 5\' 1"  (1.549 m)   Wt 189 lb 12.8 oz (86.1 kg)   SpO2 93%   BMI 35.86 kg/m , BMI Body mass index is 35.86 kg/m.  Wt Readings from Last 3 Encounters:  10/19/17 189 lb 12.8 oz (86.1 kg)  07/27/17 192 lb (87.1 kg)  06/16/17 193 lb (87.5 kg)    General: Appears comfortable at rest. HEENT: Conjunctiva and lids normal, oropharynx clear. Neck: Supple, no elevated JVP or carotid bruits, no thyromegaly. Lungs: Clear to auscultation, nonlabored breathing at rest. Cardiac: Regular rate and rhythm, no S3 2/6 systolic murmur with paradoxically split S2, no pericardial rub. Abdomen: Soft, nontender, bowel sounds present. Extremities: Mild lower right leg edema, left leg prosthesis in place status post BKA. Skin: Warm and dry. Musculoskeletal: No kyphosis. Neuropsychiatric: Alert and oriented x3, affect grossly appropriate.  ECG: I personally reviewed the  tracing from 12/07/2016 which showed sinus rhythm with left bundle branch block.  Recent Labwork:  October 2018: Cholesterol 210, triglycerides 55, HDL 72, LDL 127, hemoglobin A1c 6 0,  September 2018: BUN 54, creatinine 2.51, potassium 3.9, hemoglobin 10.11 September 2017: BUN 35, creatinine 1.97, potassium 4.3, hemoglobin 11.0, cholesterol 163, triglycerides 47, HDL 71, LDL 83  Other Studies Reviewed Today:  Echocardiogram 06/28/2017: Study Conclusions  - Left ventricle: The cavity size was normal. Wall thickness was   increased in a pattern of moderate LVH. Systolic function was   normal. The estimated ejection fraction was in the range of 50%   to 55%. Wall motion was normal; there were no regional wall   motion abnormalities. Doppler parameters are consistent with   abnormal left ventricular relaxation (grade 1 diastolic   dysfunction). - Aortic valve: Mildly calcified annulus. Trileaflet; moderately   thickened leaflets. There was mild stenosis. Mean gradient (S): 8   mm Hg. Valve area (VTI): 1.7 cm^2. Valve area (Vmax): 1.64 cm^2.   Valve area (Vmean): 1.79 cm^2. - Mitral valve: Mildly calcified annulus. Normal thickness leaflets. - Left atrium: The atrium was severely dilated. - Atrial septum: No defect or patent foramen ovale was identified. - Pulmonary arteries: Systolic pressure was moderately increased.   PA peak pressure: 47 mm Hg (S). - Technically adequate study.  Assessment and Plan:  1. History of nonischemic cardiomyopathy with improvement in LVEF, last assessed in the range of 50-55%. Plan to continue current medical regimen including Coreg, Norvasc, hydralazine, and Imdur.  2. Chronic, probably diastolic heart failure at this point. She is on high-dose Demadex with stable renal insufficiency. Weight is down a few pounds from last assessment. I reviewed her recent lab work.  3. CKD stage 3 to 4, most recent creatinine down to 1.9. She follows with Dr.  Lowanda Foster.  4. Essential hypertension. Continue with multimodal therapy.  Current medicines were reviewed with the patient today.  Disposition: Follow-up in 6 months, sooner if needed.  Signed, Satira Sark, MD, [facility] 10/19/2017 9:11 AM    [facility] at [place], [place], [place] [address] Phone: [phone]; Fax: [phone]

## 2017-10-19 ENCOUNTER — Ambulatory Visit: Payer: Medicare Other | Admitting: Cardiology

## 2017-10-19 ENCOUNTER — Encounter: Payer: Self-pay | Admitting: Cardiology

## 2017-10-19 VITALS — BP 182/68 | HR 60 | Ht 61.0 in | Wt 189.8 lb

## 2017-10-19 DIAGNOSIS — I428 Other cardiomyopathies: Secondary | ICD-10-CM | POA: Diagnosis not present

## 2017-10-19 DIAGNOSIS — I10 Essential (primary) hypertension: Secondary | ICD-10-CM | POA: Diagnosis not present

## 2017-10-19 DIAGNOSIS — N183 Chronic kidney disease, stage 3 unspecified: Secondary | ICD-10-CM

## 2017-10-19 DIAGNOSIS — I5032 Chronic diastolic (congestive) heart failure: Secondary | ICD-10-CM | POA: Diagnosis not present

## 2017-10-19 NOTE — Patient Instructions (Signed)

## 2017-11-10 DIAGNOSIS — R69 Illness, unspecified: Secondary | ICD-10-CM | POA: Diagnosis not present

## 2017-12-08 ENCOUNTER — Telehealth: Payer: Self-pay | Admitting: Cardiology

## 2017-12-08 ENCOUNTER — Other Ambulatory Visit: Payer: Self-pay

## 2017-12-08 MED ORDER — ISOSORBIDE MONONITRATE ER 30 MG PO TB24: 30.0000 mg | ORAL_TABLET | Freq: Two times a day (BID) | ORAL | 6 refills | Status: DC

## 2017-12-08 NOTE — Telephone Encounter (Signed)
Needs RX for Isosorbide sent to [facility]/tg

## 2017-12-08 NOTE — Telephone Encounter (Signed)
RX sent to [facility].

## 2017-12-16 DIAGNOSIS — R69 Illness, unspecified: Secondary | ICD-10-CM | POA: Diagnosis not present

## 2018-01-20 DIAGNOSIS — R69 Illness, unspecified: Secondary | ICD-10-CM | POA: Diagnosis not present

## 2018-01-25 ENCOUNTER — Other Ambulatory Visit: Payer: Self-pay | Admitting: Cardiology

## 2018-01-31 DIAGNOSIS — R69 Illness, unspecified: Secondary | ICD-10-CM | POA: Diagnosis not present

## 2018-02-07 ENCOUNTER — Encounter (INDEPENDENT_AMBULATORY_CARE_PROVIDER_SITE_OTHER): Payer: Medicare Other | Admitting: Ophthalmology

## 2018-02-09 DIAGNOSIS — I10 Essential (primary) hypertension: Secondary | ICD-10-CM | POA: Diagnosis not present

## 2018-02-09 DIAGNOSIS — N183 Chronic kidney disease, stage 3 (moderate): Secondary | ICD-10-CM | POA: Diagnosis not present

## 2018-02-09 DIAGNOSIS — Z79899 Other long term (current) drug therapy: Secondary | ICD-10-CM | POA: Diagnosis not present

## 2018-02-09 DIAGNOSIS — E559 Vitamin D deficiency, unspecified: Secondary | ICD-10-CM | POA: Diagnosis not present

## 2018-02-09 DIAGNOSIS — D509 Iron deficiency anemia, unspecified: Secondary | ICD-10-CM | POA: Diagnosis not present

## 2018-02-09 DIAGNOSIS — R809 Proteinuria, unspecified: Secondary | ICD-10-CM | POA: Diagnosis not present

## 2018-02-15 DIAGNOSIS — I509 Heart failure, unspecified: Secondary | ICD-10-CM | POA: Diagnosis not present

## 2018-02-15 DIAGNOSIS — R809 Proteinuria, unspecified: Secondary | ICD-10-CM | POA: Diagnosis not present

## 2018-02-15 DIAGNOSIS — E1129 Type 2 diabetes mellitus with other diabetic kidney complication: Secondary | ICD-10-CM | POA: Diagnosis not present

## 2018-02-15 DIAGNOSIS — E559 Vitamin D deficiency, unspecified: Secondary | ICD-10-CM | POA: Diagnosis not present

## 2018-02-15 DIAGNOSIS — I10 Essential (primary) hypertension: Secondary | ICD-10-CM | POA: Diagnosis not present

## 2018-02-15 DIAGNOSIS — D649 Anemia, unspecified: Secondary | ICD-10-CM | POA: Diagnosis not present

## 2018-02-15 DIAGNOSIS — N184 Chronic kidney disease, stage 4 (severe): Secondary | ICD-10-CM | POA: Diagnosis not present

## 2018-02-21 ENCOUNTER — Encounter (INDEPENDENT_AMBULATORY_CARE_PROVIDER_SITE_OTHER): Payer: Medicare HMO | Admitting: Ophthalmology

## 2018-02-21 DIAGNOSIS — H35341 Macular cyst, hole, or pseudohole, right eye: Secondary | ICD-10-CM | POA: Diagnosis not present

## 2018-02-21 DIAGNOSIS — E113593 Type 2 diabetes mellitus with proliferative diabetic retinopathy without macular edema, bilateral: Secondary | ICD-10-CM | POA: Diagnosis not present

## 2018-02-21 DIAGNOSIS — I10 Essential (primary) hypertension: Secondary | ICD-10-CM | POA: Diagnosis not present

## 2018-02-21 DIAGNOSIS — H35033 Hypertensive retinopathy, bilateral: Secondary | ICD-10-CM

## 2018-02-21 DIAGNOSIS — H43812 Vitreous degeneration, left eye: Secondary | ICD-10-CM

## 2018-02-21 DIAGNOSIS — E11319 Type 2 diabetes mellitus with unspecified diabetic retinopathy without macular edema: Secondary | ICD-10-CM | POA: Diagnosis not present

## 2018-02-22 DIAGNOSIS — E1122 Type 2 diabetes mellitus with diabetic chronic kidney disease: Secondary | ICD-10-CM | POA: Diagnosis not present

## 2018-02-22 DIAGNOSIS — I509 Heart failure, unspecified: Secondary | ICD-10-CM | POA: Diagnosis not present

## 2018-02-22 DIAGNOSIS — I252 Old myocardial infarction: Secondary | ICD-10-CM | POA: Diagnosis not present

## 2018-02-22 DIAGNOSIS — E785 Hyperlipidemia, unspecified: Secondary | ICD-10-CM | POA: Diagnosis not present

## 2018-02-22 DIAGNOSIS — Z794 Long term (current) use of insulin: Secondary | ICD-10-CM | POA: Diagnosis not present

## 2018-02-22 DIAGNOSIS — I130 Hypertensive heart and chronic kidney disease with heart failure and stage 1 through stage 4 chronic kidney disease, or unspecified chronic kidney disease: Secondary | ICD-10-CM | POA: Diagnosis not present

## 2018-02-22 DIAGNOSIS — R69 Illness, unspecified: Secondary | ICD-10-CM | POA: Diagnosis not present

## 2018-02-22 DIAGNOSIS — E669 Obesity, unspecified: Secondary | ICD-10-CM | POA: Diagnosis not present

## 2018-02-22 DIAGNOSIS — I2510 Atherosclerotic heart disease of native coronary artery without angina pectoris: Secondary | ICD-10-CM | POA: Diagnosis not present

## 2018-02-22 DIAGNOSIS — Z89512 Acquired absence of left leg below knee: Secondary | ICD-10-CM | POA: Diagnosis not present

## 2018-03-02 ENCOUNTER — Other Ambulatory Visit: Payer: Self-pay | Admitting: Cardiology

## 2018-03-02 DIAGNOSIS — R69 Illness, unspecified: Secondary | ICD-10-CM | POA: Diagnosis not present

## 2018-03-15 DIAGNOSIS — R809 Proteinuria, unspecified: Secondary | ICD-10-CM | POA: Diagnosis not present

## 2018-03-15 DIAGNOSIS — I10 Essential (primary) hypertension: Secondary | ICD-10-CM | POA: Diagnosis not present

## 2018-03-15 DIAGNOSIS — N183 Chronic kidney disease, stage 3 (moderate): Secondary | ICD-10-CM | POA: Diagnosis not present

## 2018-03-15 DIAGNOSIS — E559 Vitamin D deficiency, unspecified: Secondary | ICD-10-CM | POA: Diagnosis not present

## 2018-03-15 DIAGNOSIS — Z79899 Other long term (current) drug therapy: Secondary | ICD-10-CM | POA: Diagnosis not present

## 2018-03-15 DIAGNOSIS — D509 Iron deficiency anemia, unspecified: Secondary | ICD-10-CM | POA: Diagnosis not present

## 2018-03-22 DIAGNOSIS — R809 Proteinuria, unspecified: Secondary | ICD-10-CM | POA: Diagnosis not present

## 2018-03-22 DIAGNOSIS — I10 Essential (primary) hypertension: Secondary | ICD-10-CM | POA: Diagnosis not present

## 2018-03-22 DIAGNOSIS — I509 Heart failure, unspecified: Secondary | ICD-10-CM | POA: Diagnosis not present

## 2018-03-22 DIAGNOSIS — E872 Acidosis: Secondary | ICD-10-CM | POA: Diagnosis not present

## 2018-03-22 DIAGNOSIS — N184 Chronic kidney disease, stage 4 (severe): Secondary | ICD-10-CM | POA: Diagnosis not present

## 2018-03-30 DIAGNOSIS — Z0001 Encounter for general adult medical examination with abnormal findings: Secondary | ICD-10-CM | POA: Diagnosis not present

## 2018-03-30 DIAGNOSIS — E785 Hyperlipidemia, unspecified: Secondary | ICD-10-CM | POA: Diagnosis not present

## 2018-03-30 DIAGNOSIS — I129 Hypertensive chronic kidney disease with stage 1 through stage 4 chronic kidney disease, or unspecified chronic kidney disease: Secondary | ICD-10-CM | POA: Diagnosis not present

## 2018-03-30 DIAGNOSIS — E119 Type 2 diabetes mellitus without complications: Secondary | ICD-10-CM | POA: Diagnosis not present

## 2018-03-30 DIAGNOSIS — Z79899 Other long term (current) drug therapy: Secondary | ICD-10-CM | POA: Diagnosis not present

## 2018-03-30 DIAGNOSIS — I10 Essential (primary) hypertension: Secondary | ICD-10-CM | POA: Diagnosis not present

## 2018-04-01 NOTE — Progress Notes (Signed)
Cardiology Office Note  Date: 04/04/2018   ID: [name], DOB 05-30-1946, MRN [id number]  PCP: Sharon Gravel, MD  Primary Cardiologist: Sharon Lesches, MD   Chief Complaint  Patient presents with  . Cardiomyopathy    History of Present Illness: [name] is a 72 y.o. female last seen in January.  She is here for a routine visit.  She tells me that Sharon. Lowanda [name] cut back her Demadex to 40 mg twice daily due to progressive renal dysfunction.  We are requesting the records.  There was also a question as to whether she might tolerate a lower dose diuretic.  She states that she makes good urine output.  Weights have been relatively stable, fluctuating by a few pounds up or down.  We went over her heart rate and blood pressure checks from home.  She uses clonidine only occasionally.  Otherwise reports compliance with her medications.  Current cardiac regimen includes Norvasc, Coreg, hydralazine, Imdur, and Demadex.  I personally reviewed her ECG today which shows sinus bradycardia with IVCD of left bundle branch block type and rightward axis.  Past Medical History:  Diagnosis Date  . Anemia   . Anxiety   . Chronic systolic heart failure ([place])   . Coronary atherosclerosis of native coronary artery    Nonobstructive  . Depression   . Diabetes mellitus, type 2 ([place])   . Essential hypertension, benign   . MRSA bacteremia January 2015   Associated with probable tricuspid valve vegetation, ICD system extracted February 2015  . Nonischemic cardiomyopathy (HCC)    LVEF 20% up to 45%  . PAD (peripheral artery disease) (HCC)    Left foot gangrene    Past Surgical History:  Procedure Laterality Date  . [place] VITRECTOMY WITH 20 GAUGE MVR PORT FOR MACULAR HOLE Right 11/10/2012   Procedure: 25 GAUGE PARS PLANA VITRECTOMY WITH 20 GAUGE MVR PORT FOR MACULAR HOLE;  Surgeon: Sharon Pedro, MD;  Location: [place];  Service: Ophthalmology;  Laterality: Right;  . 25 GAUGE  PARS PLANA VITRECTOMY WITH 20 GAUGE MVR PORT FOR MACULAR HOLE Right 05/09/2013   Procedure: 25 GAUGE PARS PLANA VITRECTOMY WITH 20 GAUGE MVR PORT FOR MACULAR HOLE;  Surgeon: Sharon Pedro, MD;  Location: [place];  Service: Ophthalmology;  Laterality: Right;  . ABDOMINAL HYSTERECTOMY    . AMPUTATION Left 03/23/2014   Procedure: Left Foot 1st Ray Amputation;  Surgeon: Sharon Minion, MD;  Location: [place];  Service: Orthopedics;  Laterality: Left;  Left Foot 1st Ray Amputation  . AMPUTATION Left 04/13/2014   Procedure: AMPUTATION BELOW KNEE;  Surgeon: Sharon Minion, MD;  Location: [place];  Service: Orthopedics;  Laterality: Left;  Left Below Knee Amputation  . BLADDER SURGERY  2011 BENIGN MASS REMOVED FROM BLADDER  . BREAST LUMPECTOMY     Bil  . CARDIAC DEFIBRILLATOR PLACEMENT     Medtronic [id number]   . CENTRAL VENOUS CATHETER INSERTION Right 11/09/2013   Procedure: INSERTION CENTRAL LINE ADULT;  Surgeon: Sharon Lance, MD;  Location: [place];  Service: Cardiovascular;  Laterality: Right;  . Exploratory laparotomy with lysis of adhesions    . EYE SURGERY  BILATERAL CATARACTS REMOVAL  10 YEARS AGO PER PATIENT  . GAS INSERTION Right 11/10/2012   Procedure: INSERTION OF GAS;  Surgeon: Sharon Pedro, MD;  Location: [place] [facility];  Service: Ophthalmology;  Laterality: Right;  C3F8  . GAS/FLUID EXCHANGE Right 05/09/2013   Procedure: GAS/FLUID EXCHANGE;  Surgeon: Sharon Pedro, MD;  Location: [place];  Service: Ophthalmology;  Laterality: Right;  C3F8   . ICD LEAD REMOVAL N/A 11/09/2013   Procedure: ICD LEAD REMOVAL;  Surgeon: Sharon Lance, MD;  Location: [place];  Service: Cardiovascular;  Laterality: N/A;  . LASER PHOTO ABLATION Right 05/09/2013   Procedure: LASER PHOTO ABLATION;  Surgeon: Sharon Pedro, MD;  Location: [place];  Service: Ophthalmology;  Laterality: Right;  Endolaser   . Left shoulder surgery     Rotator Cuff  . MEMBRANE PEEL Right 05/09/2013   Procedure: MEMBRANE PEEL;  Surgeon: Sharon Pedro, MD;   Location: [place];  Service: Ophthalmology;  Laterality: Right;  . PR VEIN BYPASS GRAFT,AORTO-FEM-POP  05-2011   Left popliteal- PTA graft   . SERUM PATCH Right 11/10/2012   Procedure: SERUM PATCH;  Surgeon: Sharon Pedro, MD;  Location: [place];  Service: Ophthalmology;  Laterality: Right;  . SERUM PATCH Right 05/09/2013   Procedure: SERUM PATCH;  Surgeon: Sharon Pedro, MD;  Location: [place];  Service: Ophthalmology;  Laterality: Right;  . STUMP REVISION Left 05/30/2014   Procedure: Left Below Knee Amputation Revision;  Surgeon: Sharon Minion, MD;  Location: [place];  Service: Orthopedics;  Laterality: Left;  . TOE AMPUTATION Left    great toe  . Viterectomy  11/10/2012   OD   [name]    Current Outpatient Medications  Medication Sig Dispense Refill  . ALPRAZolam (XANAX) 0.5 MG tablet Take one tablet by mouth twice daily as needed for anxiety    . amLODipine (NORVASC) 10 MG tablet TAKE 1 TABLET BY MOUTH EVERY DAY 30 tablet 6  . aspirin 81 MG tablet Take 81 mg by mouth daily.     . calcitRIOL (ROCALTROL) 0.25 MCG capsule 0.25 mcg daily.     . carvedilol (COREG) 25 MG tablet TAKE 1 TABLET BY MOUTH TWICE A DAY 60 tablet 6  . Cholecalciferol (VITAMIN D-3) 1000 UNITS CAPS Take 1,000 Units by mouth daily.    . cloNIDine (CATAPRES) 0.1 MG tablet Take 0.1 mg daily as needed for systolic blood pressure greater than 180. 60 tablet 11  . docusate sodium (COLACE) 100 MG capsule Take 100 mg by mouth daily as needed (constipation).     . hydrALAZINE (APRESOLINE) 100 MG tablet TAKE 1 TABLET BY MOUTH 3 TIMES A DAY 90 tablet 0  . Insulin Glargine (BASAGLAR KWIKPEN) 100 UNIT/ML SOPN Inject 30 Units into the skin at bedtime.    . insulin lispro (HUMALOG) 100 UNIT/ML injection Inject 0-8 Units into the skin every evening. Sliding scale: 200-249=2 units, 250-299=4 units, 300-349=6 units, >350=8 units    . nitroGLYCERIN (NITROSTAT) 0.4 MG SL tablet Place 1 tablet (0.4 mg total) under the tongue every 5 (five)  minutes as needed for chest pain. 25 tablet 3  . polyethylene glycol (MIRALAX / GLYCOLAX) packet Take 17 g by mouth daily as needed (constipation).     . sodium bicarbonate 650 MG tablet Take 650 mg by mouth 3 (three) times daily.    . isosorbide mononitrate (IMDUR) 30 MG 24 hr tablet Take 1 tablet (30 mg total) by mouth 2 (two) times daily. 60 tablet 6  . torsemide (DEMADEX) 20 MG tablet Take 1 tablet (20 mg total) by mouth 2 (two) times daily. 180 tablet 3   No current facility-administered medications for this visit.    Allergies:  Hydrocodone-acetaminophen   Social History: The patient  reports that she has never  smoked. She has never used smokeless tobacco. She reports that she does not drink alcohol or use drugs.   ROS:  Please see the history of present illness. Otherwise, complete review of systems is positive for chronic back pain, uses a walker.  All other systems are reviewed and negative.   Physical Exam: VS:  BP (!) 160/74 (BP Location: Left Arm)   Pulse (!) 59   Ht 5\' 1"  (1.549 m)   Wt 188 lb (85.3 kg)   SpO2 90%   BMI 35.52 kg/m , BMI Body mass index is 35.52 kg/m.  Wt Readings from Last 3 Encounters:  04/04/18 188 lb (85.3 kg)  10/19/17 189 lb 12.8 oz (86.1 kg)  07/27/17 192 lb (87.1 kg)    General: Patient appears comfortable at rest. HEENT: Conjunctiva and lids normal, oropharynx clear. Neck: Supple, no elevated JVP or carotid bruits, no thyromegaly. Lungs: Clear to auscultation, nonlabored breathing at rest. Cardiac: Regular rate and rhythm, no S3, 2/6 systolic murmur. Abdomen: Obese , nontender, bowel sounds present. Extremities: Left leg prosthesis in place status post BKA. Skin: Warm and dry. Musculoskeletal: No kyphosis. Neuropsychiatric: Alert and oriented x3, affect grossly appropriate.  ECG: I personally reviewed the tracing from 12/07/2016 which showed sinus rhythm with left bundle branch block.  Recent Labwork:  December 2018: BUN 37, creatinine  2.2, potassium 4.4, AST 19, ALT 8, cholesterol 163, triglycerides 47, HDL 71, LDL 83, hemoglobin A1c 6.5  Other Studies Reviewed Today:  Echocardiogram 06/28/2017: Study Conclusions  - Left ventricle: The cavity size was normal. Wall thickness was increased in a pattern of moderate LVH. Systolic function was normal. The estimated ejection fraction was in the range of 50% to 55%. Wall motion was normal; there were no regional wall motion abnormalities. Doppler parameters are consistent with abnormal left ventricular relaxation (grade 1 diastolic dysfunction). - Aortic valve: Mildly calcified annulus. Trileaflet; moderately thickened leaflets. There was mild stenosis. Mean gradient (S): 8 mm Hg. Valve area (VTI): 1.7 cm^2. Valve area (Vmax): 1.64 cm^2. Valve area (Vmean): 1.79 cm^2. - Mitral valve: Mildly calcified annulus. Normal thickness leaflets. - Left atrium: The atrium was severely dilated. - Atrial septum: No defect or patent foramen ovale was identified. - Pulmonary arteries: Systolic pressure was moderately increased. PA peak pressure: 47 mm Hg (S). - Technically adequate study.  Assessment and Plan:  1.  Chronic combined heart failure, predominantly diastolic with LVEF 50 to 12% by echocardiogram last September.  Continue current medical regimen although we are reducing diuretic dose in the face of progressive renal insufficiency.  Anticipate follow-up echocardiogram around the time of her next visit.  2.  CKD stage III-IV baseline with reportedly progressive renal insufficiency since January.  She follows with Sharon. Lowanda [name].  I am requesting her most recent lab work.  We will reduce Demadex to 20 mg twice daily.  Dose can be doubled if weight increases 2 to 3 pounds in 24 hours or 5 pounds in a week.  3.  Essential hypertension.  Continue with present regimen.  4.  Nonischemic cardiomyopathy.  Current medicines were reviewed with the patient  today.   Orders Placed This Encounter  Procedures  . EKG 12-Lead  . ECHOCARDIOGRAM COMPLETE    Disposition: Follow-up in 3 months.  Signed, Satira Sark, MD, [facility] 04/04/2018 9:17 AM    [facility] [place]. [address] [place], [place] [address] Phone: [phone]; Fax: [phone]

## 2018-04-02 ENCOUNTER — Other Ambulatory Visit: Payer: Self-pay | Admitting: Cardiology

## 2018-04-04 ENCOUNTER — Encounter: Payer: Self-pay | Admitting: Cardiology

## 2018-04-04 ENCOUNTER — Ambulatory Visit: Payer: Medicare HMO | Admitting: Cardiology

## 2018-04-04 VITALS — BP 160/74 | HR 59 | Ht 61.0 in | Wt 188.0 lb

## 2018-04-04 DIAGNOSIS — I10 Essential (primary) hypertension: Secondary | ICD-10-CM

## 2018-04-04 DIAGNOSIS — I428 Other cardiomyopathies: Secondary | ICD-10-CM

## 2018-04-04 DIAGNOSIS — I5032 Chronic diastolic (congestive) heart failure: Secondary | ICD-10-CM | POA: Diagnosis not present

## 2018-04-04 DIAGNOSIS — N184 Chronic kidney disease, stage 4 (severe): Secondary | ICD-10-CM | POA: Diagnosis not present

## 2018-04-04 MED ORDER — TORSEMIDE 20 MG PO TABS: 20.0000 mg | ORAL_TABLET | Freq: Two times a day (BID) | ORAL | 3 refills | Status: DC

## 2018-04-04 NOTE — Patient Instructions (Addendum)
Your physician wants you to follow-up in:3 months With Dr.McDowell    DECREASE Demadex to 20 mg twice a day, if you gain more than 3 lbs in 24 hours, or, 5 lbs in one week, take Demadex 40 mg twice a day for one day, and then go back to 20 mg twice a day     Your physician has requested that you have an echocardiogram JUST BEFORE NEXT VISIT IN 3 MONTHS. Echocardiography is a painless test that uses sound waves to create images of your heart. It provides your doctor with information about the size and shape of your heart and how well your heart's chambers and valves are working. This procedure takes approximately one hour. There are no restrictions for this procedure.  No lab work ordered today     If you need a refill on your cardiac medications before your next appointment, please call your pharmacy.     Thank you for choosing [facility] !

## 2018-04-06 DIAGNOSIS — R69 Illness, unspecified: Secondary | ICD-10-CM | POA: Diagnosis not present

## 2018-04-25 DIAGNOSIS — I10 Essential (primary) hypertension: Secondary | ICD-10-CM | POA: Diagnosis not present

## 2018-04-25 DIAGNOSIS — N185 Chronic kidney disease, stage 5: Secondary | ICD-10-CM | POA: Diagnosis not present

## 2018-04-25 DIAGNOSIS — E785 Hyperlipidemia, unspecified: Secondary | ICD-10-CM | POA: Diagnosis not present

## 2018-04-25 DIAGNOSIS — I509 Heart failure, unspecified: Secondary | ICD-10-CM | POA: Diagnosis not present

## 2018-04-25 DIAGNOSIS — E119 Type 2 diabetes mellitus without complications: Secondary | ICD-10-CM | POA: Diagnosis not present

## 2018-04-25 DIAGNOSIS — I129 Hypertensive chronic kidney disease with stage 1 through stage 4 chronic kidney disease, or unspecified chronic kidney disease: Secondary | ICD-10-CM | POA: Diagnosis not present

## 2018-04-25 DIAGNOSIS — R69 Illness, unspecified: Secondary | ICD-10-CM | POA: Diagnosis not present

## 2018-04-25 DIAGNOSIS — Z79899 Other long term (current) drug therapy: Secondary | ICD-10-CM | POA: Diagnosis not present

## 2018-04-25 DIAGNOSIS — Z0000 Encounter for general adult medical examination without abnormal findings: Secondary | ICD-10-CM | POA: Diagnosis not present

## 2018-04-25 DIAGNOSIS — Z794 Long term (current) use of insulin: Secondary | ICD-10-CM | POA: Diagnosis not present

## 2018-05-09 DIAGNOSIS — R69 Illness, unspecified: Secondary | ICD-10-CM | POA: Diagnosis not present

## 2018-05-23 DIAGNOSIS — E559 Vitamin D deficiency, unspecified: Secondary | ICD-10-CM | POA: Diagnosis not present

## 2018-05-23 DIAGNOSIS — E1129 Type 2 diabetes mellitus with other diabetic kidney complication: Secondary | ICD-10-CM | POA: Diagnosis not present

## 2018-05-23 DIAGNOSIS — R809 Proteinuria, unspecified: Secondary | ICD-10-CM | POA: Diagnosis not present

## 2018-05-23 DIAGNOSIS — N184 Chronic kidney disease, stage 4 (severe): Secondary | ICD-10-CM | POA: Diagnosis not present

## 2018-05-23 DIAGNOSIS — D649 Anemia, unspecified: Secondary | ICD-10-CM | POA: Diagnosis not present

## 2018-05-23 DIAGNOSIS — I10 Essential (primary) hypertension: Secondary | ICD-10-CM | POA: Diagnosis not present

## 2018-05-23 DIAGNOSIS — Z79899 Other long term (current) drug therapy: Secondary | ICD-10-CM | POA: Diagnosis not present

## 2018-06-01 DIAGNOSIS — E872 Acidosis: Secondary | ICD-10-CM | POA: Diagnosis not present

## 2018-06-01 DIAGNOSIS — N184 Chronic kidney disease, stage 4 (severe): Secondary | ICD-10-CM | POA: Diagnosis not present

## 2018-06-01 DIAGNOSIS — R809 Proteinuria, unspecified: Secondary | ICD-10-CM | POA: Diagnosis not present

## 2018-06-01 DIAGNOSIS — I509 Heart failure, unspecified: Secondary | ICD-10-CM | POA: Diagnosis not present

## 2018-06-01 DIAGNOSIS — I10 Essential (primary) hypertension: Secondary | ICD-10-CM | POA: Diagnosis not present

## 2018-06-10 DIAGNOSIS — R69 Illness, unspecified: Secondary | ICD-10-CM | POA: Diagnosis not present

## 2018-06-28 DIAGNOSIS — Z1212 Encounter for screening for malignant neoplasm of rectum: Secondary | ICD-10-CM | POA: Diagnosis not present

## 2018-06-28 DIAGNOSIS — Z1211 Encounter for screening for malignant neoplasm of colon: Secondary | ICD-10-CM | POA: Diagnosis not present

## 2018-07-07 DIAGNOSIS — I10 Essential (primary) hypertension: Secondary | ICD-10-CM | POA: Diagnosis not present

## 2018-07-07 DIAGNOSIS — E119 Type 2 diabetes mellitus without complications: Secondary | ICD-10-CM | POA: Diagnosis not present

## 2018-07-07 DIAGNOSIS — E785 Hyperlipidemia, unspecified: Secondary | ICD-10-CM | POA: Diagnosis not present

## 2018-07-11 ENCOUNTER — Ambulatory Visit (HOSPITAL_COMMUNITY)
Admission: RE | Admit: 2018-07-11 | Discharge: 2018-07-11 | Disposition: A | Discharge status: Home / Self Care | Payer: Medicare HMO | Source: Ambulatory Visit | Attending: Cardiology | Admitting: Cardiology

## 2018-07-11 DIAGNOSIS — I350 Nonrheumatic aortic (valve) stenosis: Secondary | ICD-10-CM | POA: Diagnosis not present

## 2018-07-11 DIAGNOSIS — I428 Other cardiomyopathies: Secondary | ICD-10-CM | POA: Diagnosis not present

## 2018-07-11 DIAGNOSIS — I119 Hypertensive heart disease without heart failure: Secondary | ICD-10-CM | POA: Insufficient documentation

## 2018-07-11 DIAGNOSIS — I429 Cardiomyopathy, unspecified: Secondary | ICD-10-CM | POA: Diagnosis present

## 2018-07-11 DIAGNOSIS — E785 Hyperlipidemia, unspecified: Secondary | ICD-10-CM | POA: Diagnosis not present

## 2018-07-11 DIAGNOSIS — E119 Type 2 diabetes mellitus without complications: Secondary | ICD-10-CM | POA: Insufficient documentation

## 2018-07-11 NOTE — Progress Notes (Signed)
*  PRELIMINARY RESULTS* Echocardiogram 2D Echocardiogram has been performed.  [name] 07/11/2018, 9:44 AM

## 2018-07-16 DIAGNOSIS — R69 Illness, unspecified: Secondary | ICD-10-CM | POA: Diagnosis not present

## 2018-07-18 DIAGNOSIS — R69 Illness, unspecified: Secondary | ICD-10-CM | POA: Diagnosis not present

## 2018-07-18 DIAGNOSIS — I10 Essential (primary) hypertension: Secondary | ICD-10-CM | POA: Diagnosis not present

## 2018-07-18 DIAGNOSIS — E785 Hyperlipidemia, unspecified: Secondary | ICD-10-CM | POA: Diagnosis not present

## 2018-07-18 DIAGNOSIS — Z79899 Other long term (current) drug therapy: Secondary | ICD-10-CM | POA: Diagnosis not present

## 2018-07-18 DIAGNOSIS — E119 Type 2 diabetes mellitus without complications: Secondary | ICD-10-CM | POA: Diagnosis not present

## 2018-07-18 DIAGNOSIS — Z23 Encounter for immunization: Secondary | ICD-10-CM | POA: Diagnosis not present

## 2018-07-18 DIAGNOSIS — Z794 Long term (current) use of insulin: Secondary | ICD-10-CM | POA: Diagnosis not present

## 2018-07-18 DIAGNOSIS — I509 Heart failure, unspecified: Secondary | ICD-10-CM | POA: Diagnosis not present

## 2018-07-29 NOTE — Progress Notes (Signed)
Cardiology Office Note  Date: 08/01/2018   ID: [name], DOB August 16, 1946, MRN [id number]  PCP: Jani Gravel, MD  Primary Cardiologist: Rozann Lesches, MD   Chief Complaint  Patient presents with  . Cardiomyopathy    History of Present Illness: [name] is a 72 y.o. female last seen in July.  She is here for a routine follow-up visit.  Describes no major change since last assessment, weights have been stable despite decreased dose Demadex.  At the last visit Demadex was reduced to 20 mg twice daily with doubling as needed for increases in weight to 3 pounds in 24 hours or 5 pounds in a week.  Her weight is down about 3 pounds.  I went over her most recent lab work which is outlined below, creatinine 2.56 down from 2.98.  Follow-up echocardiogram in October showed LVEF approximately 50% with grade 1 diastolic dysfunction, moderate aortic valve stenosis, severe left atrial enlargement, and trivial tricuspid rotation.  Past Medical History:  Diagnosis Date  . Anemia   . Anxiety   . Chronic systolic heart failure ([name])   . Coronary atherosclerosis of native coronary artery    Nonobstructive  . Depression   . Diabetes mellitus, type 2 ([name])   . Essential hypertension, benign   . MRSA bacteremia January 2015   Associated with probable tricuspid valve vegetation, ICD system extracted February 2015  . Nonischemic cardiomyopathy (HCC)    LVEF 20% up to 45%  . PAD (peripheral artery disease) (HCC)    Left foot gangrene    Past Surgical History:  Procedure Laterality Date  . [place] VITRECTOMY WITH 20 GAUGE MVR PORT FOR MACULAR HOLE Right 11/10/2012   Procedure: 25 GAUGE PARS PLANA VITRECTOMY WITH 20 GAUGE MVR PORT FOR MACULAR HOLE;  Surgeon: Hayden Pedro, MD;  Location: [place];  Service: Ophthalmology;  Laterality: Right;  . 25 GAUGE PARS PLANA VITRECTOMY WITH 20 GAUGE MVR PORT FOR MACULAR HOLE Right 05/09/2013   Procedure: 25 GAUGE PARS PLANA  VITRECTOMY WITH 20 GAUGE MVR PORT FOR MACULAR HOLE;  Surgeon: Hayden Pedro, MD;  Location: [place];  Service: Ophthalmology;  Laterality: Right;  . ABDOMINAL HYSTERECTOMY    . AMPUTATION Left 03/23/2014   Procedure: Left Foot 1st Ray Amputation;  Surgeon: Newt Minion, MD;  Location: [place];  Service: Orthopedics;  Laterality: Left;  Left Foot 1st Ray Amputation  . AMPUTATION Left 04/13/2014   Procedure: AMPUTATION BELOW KNEE;  Surgeon: Newt Minion, MD;  Location: [place];  Service: Orthopedics;  Laterality: Left;  Left Below Knee Amputation  . BLADDER SURGERY  2011 BENIGN MASS REMOVED FROM BLADDER  . BREAST LUMPECTOMY     Bil  . CARDIAC DEFIBRILLATOR PLACEMENT     Medtronic [id number]   . CENTRAL VENOUS CATHETER INSERTION Right 11/09/2013   Procedure: INSERTION CENTRAL LINE ADULT;  Surgeon: Evans Lance, MD;  Location: [place];  Service: Cardiovascular;  Laterality: Right;  . Exploratory laparotomy with lysis of adhesions    . EYE SURGERY  BILATERAL CATARACTS REMOVAL  10 YEARS AGO PER PATIENT  . GAS INSERTION Right 11/10/2012   Procedure: INSERTION OF GAS;  Surgeon: Hayden Pedro, MD;  Location: [place];  Service: Ophthalmology;  Laterality: Right;  C3F8  . GAS/FLUID EXCHANGE Right 05/09/2013   Procedure: GAS/FLUID EXCHANGE;  Surgeon: Hayden Pedro, MD;  Location: [place];  Service: Ophthalmology;  Laterality: Right;  C3F8   . ICD LEAD  REMOVAL N/A 11/09/2013   Procedure: ICD LEAD REMOVAL;  Surgeon: Evans Lance, MD;  Location: [place];  Service: Cardiovascular;  Laterality: N/A;  . LASER PHOTO ABLATION Right 05/09/2013   Procedure: LASER PHOTO ABLATION;  Surgeon: Hayden Pedro, MD;  Location: [place];  Service: Ophthalmology;  Laterality: Right;  Endolaser   . Left shoulder surgery     Rotator Cuff  . MEMBRANE PEEL Right 05/09/2013   Procedure: MEMBRANE PEEL;  Surgeon: Hayden Pedro, MD;  Location: [place];  Service: Ophthalmology;  Laterality: Right;  . PR VEIN BYPASS GRAFT,AORTO-FEM-POP  05-2011    Left popliteal- PTA graft   . SERUM PATCH Right 11/10/2012   Procedure: SERUM PATCH;  Surgeon: Hayden Pedro, MD;  Location: [place];  Service: Ophthalmology;  Laterality: Right;  . SERUM PATCH Right 05/09/2013   Procedure: SERUM PATCH;  Surgeon: Hayden Pedro, MD;  Location: [place];  Service: Ophthalmology;  Laterality: Right;  . STUMP REVISION Left 05/30/2014   Procedure: Left Below Knee Amputation Revision;  Surgeon: Newt Minion, MD;  Location: [place];  Service: Orthopedics;  Laterality: Left;  . TOE AMPUTATION Left    great toe  . Viterectomy  11/10/2012   OD   Dr [name]    Current Outpatient Medications  Medication Sig Dispense Refill  . ALPRAZolam (XANAX) 0.5 MG tablet Take one tablet by mouth twice daily as needed for anxiety    . amLODipine (NORVASC) 10 MG tablet TAKE 1 TABLET BY MOUTH EVERY DAY 30 tablet 6  . aspirin 81 MG tablet Take 81 mg by mouth daily.     . calcitRIOL (ROCALTROL) 0.25 MCG capsule 0.25 mcg daily.     . carvedilol (COREG) 25 MG tablet TAKE 1 TABLET BY MOUTH TWICE A DAY 60 tablet 6  . Cholecalciferol (VITAMIN D-3) 1000 UNITS CAPS Take 1,000 Units by mouth daily.    . cloNIDine (CATAPRES) 0.1 MG tablet Take 0.1 mg daily as needed for systolic blood pressure greater than 180. 60 tablet 11  . docusate sodium (COLACE) 100 MG capsule Take 100 mg by mouth daily as needed (constipation).     . hydrALAZINE (APRESOLINE) 100 MG tablet TAKE 1 TABLET BY MOUTH 3 TIMES A DAY 90 tablet 6  . Insulin Glargine (BASAGLAR KWIKPEN) 100 UNIT/ML SOPN Inject 30 Units into the skin at bedtime.    . insulin lispro (HUMALOG) 100 UNIT/ML injection Inject 0-8 Units into the skin every evening. Sliding scale: 200-249=2 units, 250-299=4 units, 300-349=6 units, >350=8 units    . nitroGLYCERIN (NITROSTAT) 0.4 MG SL tablet Place 1 tablet (0.4 mg total) under the tongue every 5 (five) minutes as needed for chest pain. 25 tablet 3  . polyethylene glycol (MIRALAX / GLYCOLAX) packet Take 17 g by  mouth daily as needed (constipation).     . sodium bicarbonate 650 MG tablet Take 650 mg by mouth 3 (three) times daily.    . isosorbide mononitrate (IMDUR) 30 MG 24 hr tablet Take 1 tablet (30 mg total) by mouth 2 (two) times daily. 60 tablet 6  . torsemide (DEMADEX) 20 MG tablet Take 1 tablet (20 mg total) by mouth 2 (two) times daily. 180 tablet 3   No current facility-administered medications for this visit.    Allergies:  Hydrocodone-acetaminophen   Social History: The patient  reports that she has never smoked. She has never used smokeless tobacco. She reports that she does not drink alcohol or use drugs.   ROS:  Please see the history of present illness. Otherwise, complete review of systems is positive for none.  All other systems are reviewed and negative.   Physical Exam: VS:  BP (!) 152/56   Pulse (!) 59   Ht 5\' 1"  (1.549 m)   Wt 186 lb (84.4 kg)   SpO2 94%   BMI 35.14 kg/m , BMI Body mass index is 35.14 kg/m.  Wt Readings from Last 3 Encounters:  08/01/18 186 lb (84.4 kg)  04/04/18 188 lb (85.3 kg)  10/19/17 189 lb 12.8 oz (86.1 kg)    General: Patient appears comfortable at rest. HEENT: Conjunctiva and lids normal, oropharynx clear. Neck: Supple, no elevated JVP or carotid bruits, no thyromegaly. Lungs: Clear to auscultation, nonlabored breathing at rest. Cardiac: Regular rate and rhythm, no S3, 2/6 systolic murmur. Abdomen: Soft, nontender, bowel sounds present. Extremities: Left leg prosthesis status post BKA. Skin: Warm and dry. Musculoskeletal: No kyphosis. Neuropsychiatric: Alert and oriented x3, affect grossly appropriate.  ECG: I personally reviewed the tracing from 04/04/2018 which shows sinus bradycardia with IVCD of left bundle branch block type and rightward axis.  Recent Labwork:  June 2019: BUN 59, creatinine 2.98, potassium 4.5, hemoglobin 10.07 July 2018: BUN 55, creatinine 2.56, potassium 4.2, cholesterol 150 TG 46, HDL 71, LDL 76, HgA1c  7.5%  Other Studies Reviewed Today:  Echocardiogram 07/11/2018: Study Conclusions  - Left ventricle: The cavity size was normal. Wall thickness was   increased in a pattern of moderate LVH. The estimated ejection   fraction was 50%. Diffuse hypokinesis. Doppler parameters are   consistent with abnormal left ventricular relaxation (grade 1   diastolic dysfunction). - Aortic valve: Mildly to moderately calcified annulus. Trileaflet;   mildly calcified leaflets. There was moderate stenosis. Mean   gradient (S): 11 mm Hg. Peak gradient (S): 20 mm Hg. VTI ratio of   LVOT to aortic valve: 0.43. Valve area (VTI): 1.22 cm^2. Valve   area (Vmax): 1.2 cm^2. Valve area (Vmean): 1.22 cm^2. - Left atrium: The atrium was severely dilated. - Right atrium: Central venous pressure (est): 3 mm Hg. - Atrial septum: No defect or patent foramen ovale was identified. - Tricuspid valve: There was trivial regurgitation. - Pulmonary arteries: Systolic pressure could not be accurately   estimated. - Pericardium, extracardiac: There was no pericardial effusion.  Assessment and Plan:  1.  Chronic diastolic heart failure with most recent LVEF 50%.  Her weight is down a few pounds on current dose of Demadex which was reduced back in July due to progressive renal insufficiency.  2.  CKD stage III-IV, follow-up creatinine 2.56 in October.  She continues to follow with Nephrology.  3.  Essential hypertension, systolic blood pressure in the 150s today.  No changes to current regimen.  4.  Nonischemic cardiomyopathy with improvement in LVEF.  She continues on Coreg, hydralazine, and Imdur.  No ACE inhibitor or ARB due to renal insufficiency.  Current medicines were reviewed with the patient today.  Disposition: Follow-up in 4 months.  Signed, Satira Sark, MD, [facility] 08/01/2018 9:01 AM    [place] [facility] [address] [place], [place] [address] Phone: [phone];  Fax: [phone]

## 2018-08-01 ENCOUNTER — Ambulatory Visit: Payer: Medicare HMO | Admitting: Cardiology

## 2018-08-01 ENCOUNTER — Encounter: Payer: Self-pay | Admitting: Cardiology

## 2018-08-01 VITALS — BP 152/56 | HR 59 | Ht 61.0 in | Wt 186.0 lb

## 2018-08-01 DIAGNOSIS — N184 Chronic kidney disease, stage 4 (severe): Secondary | ICD-10-CM

## 2018-08-01 DIAGNOSIS — I10 Essential (primary) hypertension: Secondary | ICD-10-CM

## 2018-08-01 DIAGNOSIS — I5032 Chronic diastolic (congestive) heart failure: Secondary | ICD-10-CM

## 2018-08-01 DIAGNOSIS — I428 Other cardiomyopathies: Secondary | ICD-10-CM

## 2018-08-01 NOTE — Patient Instructions (Signed)
Medication Instructions:  No Change If you need a refill on your cardiac medications before your next appointment, please call your pharmacy.   Lab work: NONE If you have labs (blood work) drawn today and your tests are completely normal, you will receive your results only by: Marland Kitchen MyChart Message (if you have MyChart) OR . A paper copy in the mail If you have any lab test that is abnormal or we need to change your treatment, we will call you to review the results.  Testing/Procedures: NONE  Follow-Up: At [facility], you and your health needs are our priority.  As part of our continuing mission to provide you with exceptional heart care, we have created designated Provider Care Teams.  These Care Teams include your primary Cardiologist (physician) and Advanced Practice Providers (APPs -  Physician Assistants and Nurse Practitioners) who all work together to provide you with the care you need, when you need it. You will need a follow up appointment in 4 months.  Please call our office 2 months in advance to schedule this appointment.  You may see Rozann Lesches, MD or one of the following Advanced Practice Providers on your designated Care Team:   Bernerd Pho, PA-C [facility]) . Ermalinda Barrios, PA-C ([place])  Any Other Special Instructions Will Be Listed Below (If Applicable). NONE

## 2018-08-05 ENCOUNTER — Other Ambulatory Visit: Payer: Self-pay | Admitting: Cardiology

## 2018-08-09 ENCOUNTER — Other Ambulatory Visit: Payer: Self-pay | Admitting: Cardiology

## 2018-08-10 ENCOUNTER — Other Ambulatory Visit: Payer: Self-pay | Admitting: Cardiology

## 2018-08-19 DIAGNOSIS — R69 Illness, unspecified: Secondary | ICD-10-CM | POA: Diagnosis not present

## 2018-08-21 ENCOUNTER — Other Ambulatory Visit: Payer: Self-pay | Admitting: Cardiology

## 2018-08-22 ENCOUNTER — Encounter (INDEPENDENT_AMBULATORY_CARE_PROVIDER_SITE_OTHER): Payer: Medicare HMO | Admitting: Ophthalmology

## 2018-08-22 DIAGNOSIS — I10 Essential (primary) hypertension: Secondary | ICD-10-CM | POA: Diagnosis not present

## 2018-08-22 DIAGNOSIS — H35033 Hypertensive retinopathy, bilateral: Secondary | ICD-10-CM | POA: Diagnosis not present

## 2018-08-22 DIAGNOSIS — H35341 Macular cyst, hole, or pseudohole, right eye: Secondary | ICD-10-CM | POA: Diagnosis not present

## 2018-08-22 DIAGNOSIS — H43812 Vitreous degeneration, left eye: Secondary | ICD-10-CM | POA: Diagnosis not present

## 2018-08-22 DIAGNOSIS — E11311 Type 2 diabetes mellitus with unspecified diabetic retinopathy with macular edema: Secondary | ICD-10-CM

## 2018-08-22 DIAGNOSIS — E113511 Type 2 diabetes mellitus with proliferative diabetic retinopathy with macular edema, right eye: Secondary | ICD-10-CM

## 2018-08-22 DIAGNOSIS — E113592 Type 2 diabetes mellitus with proliferative diabetic retinopathy without macular edema, left eye: Secondary | ICD-10-CM | POA: Diagnosis not present

## 2018-09-23 DIAGNOSIS — R69 Illness, unspecified: Secondary | ICD-10-CM | POA: Diagnosis not present

## 2018-10-03 ENCOUNTER — Other Ambulatory Visit: Payer: Self-pay | Admitting: Cardiology

## 2018-10-04 DIAGNOSIS — R809 Proteinuria, unspecified: Secondary | ICD-10-CM | POA: Diagnosis not present

## 2018-10-04 DIAGNOSIS — E785 Hyperlipidemia, unspecified: Secondary | ICD-10-CM | POA: Diagnosis not present

## 2018-10-04 DIAGNOSIS — D509 Iron deficiency anemia, unspecified: Secondary | ICD-10-CM | POA: Diagnosis not present

## 2018-10-04 DIAGNOSIS — I10 Essential (primary) hypertension: Secondary | ICD-10-CM | POA: Diagnosis not present

## 2018-10-04 DIAGNOSIS — N183 Chronic kidney disease, stage 3 (moderate): Secondary | ICD-10-CM | POA: Diagnosis not present

## 2018-10-04 DIAGNOSIS — Z79899 Other long term (current) drug therapy: Secondary | ICD-10-CM | POA: Diagnosis not present

## 2018-10-04 DIAGNOSIS — E559 Vitamin D deficiency, unspecified: Secondary | ICD-10-CM | POA: Diagnosis not present

## 2018-10-04 DIAGNOSIS — E119 Type 2 diabetes mellitus without complications: Secondary | ICD-10-CM | POA: Diagnosis not present

## 2018-10-10 DIAGNOSIS — E785 Hyperlipidemia, unspecified: Secondary | ICD-10-CM | POA: Diagnosis not present

## 2018-10-10 DIAGNOSIS — R69 Illness, unspecified: Secondary | ICD-10-CM | POA: Diagnosis not present

## 2018-10-10 DIAGNOSIS — Z79899 Other long term (current) drug therapy: Secondary | ICD-10-CM | POA: Diagnosis not present

## 2018-10-10 DIAGNOSIS — Z794 Long term (current) use of insulin: Secondary | ICD-10-CM | POA: Diagnosis not present

## 2018-10-10 DIAGNOSIS — N184 Chronic kidney disease, stage 4 (severe): Secondary | ICD-10-CM | POA: Diagnosis not present

## 2018-10-10 DIAGNOSIS — Z89512 Acquired absence of left leg below knee: Secondary | ICD-10-CM | POA: Diagnosis not present

## 2018-10-10 DIAGNOSIS — Z6833 Body mass index (BMI) 33.0-33.9, adult: Secondary | ICD-10-CM | POA: Diagnosis not present

## 2018-10-10 DIAGNOSIS — E1165 Type 2 diabetes mellitus with hyperglycemia: Secondary | ICD-10-CM | POA: Diagnosis not present

## 2018-10-10 DIAGNOSIS — I10 Essential (primary) hypertension: Secondary | ICD-10-CM | POA: Diagnosis not present

## 2018-10-11 ENCOUNTER — Other Ambulatory Visit: Payer: Self-pay | Admitting: Cardiology

## 2018-10-11 DIAGNOSIS — R69 Illness, unspecified: Secondary | ICD-10-CM | POA: Diagnosis not present

## 2018-10-17 DIAGNOSIS — R69 Illness, unspecified: Secondary | ICD-10-CM | POA: Diagnosis not present

## 2018-10-18 DIAGNOSIS — I10 Essential (primary) hypertension: Secondary | ICD-10-CM | POA: Diagnosis not present

## 2018-10-18 DIAGNOSIS — N184 Chronic kidney disease, stage 4 (severe): Secondary | ICD-10-CM | POA: Diagnosis not present

## 2018-10-18 DIAGNOSIS — I509 Heart failure, unspecified: Secondary | ICD-10-CM | POA: Diagnosis not present

## 2018-10-18 DIAGNOSIS — D649 Anemia, unspecified: Secondary | ICD-10-CM | POA: Diagnosis not present

## 2018-11-04 DIAGNOSIS — R69 Illness, unspecified: Secondary | ICD-10-CM | POA: Diagnosis not present

## 2018-11-28 DIAGNOSIS — R69 Illness, unspecified: Secondary | ICD-10-CM | POA: Diagnosis not present

## 2018-11-30 DIAGNOSIS — E785 Hyperlipidemia, unspecified: Secondary | ICD-10-CM | POA: Diagnosis not present

## 2018-11-30 DIAGNOSIS — E119 Type 2 diabetes mellitus without complications: Secondary | ICD-10-CM | POA: Diagnosis not present

## 2018-11-30 DIAGNOSIS — I10 Essential (primary) hypertension: Secondary | ICD-10-CM | POA: Diagnosis not present

## 2018-12-05 ENCOUNTER — Encounter: Payer: Self-pay | Admitting: Cardiology

## 2018-12-05 ENCOUNTER — Ambulatory Visit: Payer: Medicare HMO | Admitting: Cardiology

## 2018-12-05 VITALS — BP 200/68 | HR 60 | Ht 61.0 in | Wt 188.6 lb

## 2018-12-05 DIAGNOSIS — I428 Other cardiomyopathies: Secondary | ICD-10-CM

## 2018-12-05 DIAGNOSIS — N184 Chronic kidney disease, stage 4 (severe): Secondary | ICD-10-CM | POA: Diagnosis not present

## 2018-12-05 DIAGNOSIS — I10 Essential (primary) hypertension: Secondary | ICD-10-CM

## 2018-12-05 DIAGNOSIS — I5042 Chronic combined systolic (congestive) and diastolic (congestive) heart failure: Secondary | ICD-10-CM | POA: Diagnosis not present

## 2018-12-05 DIAGNOSIS — R69 Illness, unspecified: Secondary | ICD-10-CM | POA: Diagnosis not present

## 2018-12-05 NOTE — Progress Notes (Signed)
Cardiology Office Note  Date: 12/05/2018   ID: [name], DOB June 10, 1946, MRN [id number]  PCP: Sharon Gravel, MD  Primary Cardiologist: Sharon Lesches, MD   Chief Complaint  Patient presents with  . Cardiomyopathy    History of Present Illness: [name] is a 73 y.o. female last seen in October 2019.  She is here for a routine visit.  She reports stable weight since last encounter, has not had to use any extra Demadex.  She remains on Demadex at 20 mg twice daily at baseline.  Continues to follow with nephrology, last creatinine was 2.7.  Blood pressure was significantly elevated today.  She reports compliance with her medications, including this morning.  She uses clonidine 0.1 mg when her systolic is over 284, did not take clonidine this morning.  We discussed the fact that she may need to start using this as a standing antihypertensive.  She does not report any orthopnea or PND.  Still has a walker to use with her prosthesis.  She has had no falls.  Past Medical History:  Diagnosis Date  . Anemia   . Anxiety   . Chronic systolic heart failure ([name])   . Coronary atherosclerosis of native coronary artery    Nonobstructive  . Depression   . Diabetes mellitus, type 2 ([name])   . Essential hypertension, benign   . MRSA bacteremia January 2015   Associated with probable tricuspid valve vegetation, ICD system extracted February 2015  . Nonischemic cardiomyopathy (HCC)    LVEF 20% up to 45%  . PAD (peripheral artery disease) (HCC)    Left foot gangrene    Past Surgical History:  Procedure Laterality Date  . [place] VITRECTOMY WITH 20 GAUGE MVR PORT FOR MACULAR HOLE Right 11/10/2012   Procedure: 25 GAUGE PARS [place] VITRECTOMY WITH 20 GAUGE MVR PORT FOR MACULAR HOLE;  Surgeon: Hayden Pedro, MD;  Location: [place];  Service: Ophthalmology;  Laterality: Right;  . 25 GAUGE PARS PLANA VITRECTOMY WITH 20 GAUGE MVR PORT FOR MACULAR HOLE Right 05/09/2013   Procedure: 25 GAUGE PARS PLANA VITRECTOMY WITH 20 GAUGE MVR PORT FOR MACULAR HOLE;  Surgeon: Hayden Pedro, MD;  Location: [place];  Service: Ophthalmology;  Laterality: Right;  . ABDOMINAL HYSTERECTOMY    . AMPUTATION Left 03/23/2014   Procedure: Left Foot 1st Ray Amputation;  Surgeon: Newt Minion, MD;  Location: [place];  Service: Orthopedics;  Laterality: Left;  Left Foot 1st Ray Amputation  . AMPUTATION Left 04/13/2014   Procedure: AMPUTATION BELOW KNEE;  Surgeon: Newt Minion, MD;  Location: [place];  Service: Orthopedics;  Laterality: Left;  Left Below Knee Amputation  . BLADDER SURGERY  2011 BENIGN MASS REMOVED FROM BLADDER  . BREAST LUMPECTOMY     Bil  . CARDIAC DEFIBRILLATOR PLACEMENT     Medtronic [id number]   . CENTRAL VENOUS CATHETER INSERTION Right 11/09/2013   Procedure: INSERTION CENTRAL LINE ADULT;  Surgeon: Evans Lance, MD;  Location: [place];  Service: Cardiovascular;  Laterality: Right;  . Exploratory laparotomy with lysis of adhesions    . EYE SURGERY  BILATERAL CATARACTS REMOVAL  10 YEARS AGO PER PATIENT  . GAS INSERTION Right 11/10/2012   Procedure: INSERTION OF GAS;  Surgeon: Hayden Pedro, MD;  Location: [place];  Service: Ophthalmology;  Laterality: Right;  C3F8  . GAS/FLUID EXCHANGE Right 05/09/2013   Procedure: GAS/FLUID EXCHANGE;  Surgeon: Hayden Pedro, MD;  Location: [place];  Service: Ophthalmology;  Laterality: Right;  C3F8   . ICD LEAD REMOVAL N/A 11/09/2013   Procedure: ICD LEAD REMOVAL;  Surgeon: Evans Lance, MD;  Location: [place];  Service: Cardiovascular;  Laterality: N/A;  . LASER PHOTO ABLATION Right 05/09/2013   Procedure: LASER PHOTO ABLATION;  Surgeon: Hayden Pedro, MD;  Location: [place];  Service: Ophthalmology;  Laterality: Right;  Endolaser   . Left shoulder surgery     Rotator Cuff  . MEMBRANE PEEL Right 05/09/2013   Procedure: MEMBRANE PEEL;  Surgeon: Hayden Pedro, MD;  Location: [place];  Service: Ophthalmology;  Laterality: Right;  . PR VEIN BYPASS  GRAFT,AORTO-FEM-POP  05-2011   Left popliteal- PTA graft   . SERUM PATCH Right 11/10/2012   Procedure: SERUM PATCH;  Surgeon: Hayden Pedro, MD;  Location: [place];  Service: Ophthalmology;  Laterality: Right;  . SERUM PATCH Right 05/09/2013   Procedure: SERUM PATCH;  Surgeon: Hayden Pedro, MD;  Location: [place];  Service: Ophthalmology;  Laterality: Right;  . STUMP REVISION Left 05/30/2014   Procedure: Left Below Knee Amputation Revision;  Surgeon: Newt Minion, MD;  Location: [place];  Service: Orthopedics;  Laterality: Left;  . TOE AMPUTATION Left    great toe  . Viterectomy  11/10/2012   OD   Dr Zigmund Daniel    Current Outpatient Medications  Medication Sig Dispense Refill  . ALPRAZolam (XANAX) 0.5 MG tablet Take one tablet by mouth twice daily as needed for anxiety    . amLODipine (NORVASC) 10 MG tablet TAKE 1 TABLET BY MOUTH EVERY DAY 90 tablet 2  . aspirin 81 MG tablet Take 81 mg by mouth daily.     . calcitRIOL (ROCALTROL) 0.25 MCG capsule Take 0.25 mcg by mouth daily.     . carvedilol (COREG) 25 MG tablet TAKE 1 TABLET BY MOUTH TWICE A DAY 180 tablet 3  . Cholecalciferol (VITAMIN D-3) 1000 UNITS CAPS Take 1,000 Units by mouth every other day.     . cloNIDine (CATAPRES) 0.1 MG tablet TAKE 1 TABLET BY MOUTH DAILY AS NEEDED FOR SYSTOLIC PRESSURE GREATER THAN 180 90 tablet 7  . docusate sodium (COLACE) 100 MG capsule Take 100 mg by mouth daily as needed (constipation).     . hydrALAZINE (APRESOLINE) 100 MG tablet TAKE 1 TABLET BY MOUTH 3 TIMES A DAY 270 tablet 2  . Insulin Glargine (BASAGLAR KWIKPEN) 100 UNIT/ML SOPN Inject 30 Units into the skin at bedtime.    . insulin lispro (HUMALOG) 100 UNIT/ML injection Inject 0-8 Units into the skin every evening. Sliding scale: 200-249=2 units, 250-299=4 units, 300-349=6 units, >350=8 units    . isosorbide mononitrate (IMDUR) 30 MG 24 hr tablet TAKE 1 TABLET BY MOUTH 2 TIMES DAILY. 180 tablet 2  . nitroGLYCERIN (NITROSTAT) 0.4 MG SL tablet Place 1  tablet (0.4 mg total) under the tongue every 5 (five) minutes as needed for chest pain. 25 tablet 3  . polyethylene glycol (MIRALAX / GLYCOLAX) packet Take 17 g by mouth daily as needed (constipation).     . sodium bicarbonate 650 MG tablet Take 1,300 mg by mouth 2 (two) times daily.     Marland Kitchen torsemide (DEMADEX) 20 MG tablet Take 20 mg by mouth 2 (two) times daily. May take an extra tablet as needed for leg swelling    . loratadine (CLARITIN) 10 MG tablet Take 10 mg by mouth daily as needed.  11  . rosuvastatin (CRESTOR) 10 MG tablet Take 1 tablet  by mouth daily.     No current facility-administered medications for this visit.    Allergies:  Hydrocodone-acetaminophen   Social History: The patient  reports that she has never smoked. She has never used smokeless tobacco. She reports that she does not drink alcohol or use drugs.  ROS:  Please see the history of present illness. Otherwise, complete review of systems is positive for none.  All other systems are reviewed and negative.   Physical Exam: VS:  BP (!) 200/68   Pulse 60   Ht 5\' 1"  (1.549 m)   Wt 188 lb 9.6 oz (85.5 kg)   SpO2 98% Comment: on room air  BMI 35.64 kg/m , BMI Body mass index is 35.64 kg/m.  Wt Readings from Last 3 Encounters:  12/05/18 188 lb 9.6 oz (85.5 kg)  08/01/18 186 lb (84.4 kg)  04/04/18 188 lb (85.3 kg)    General: Patient appears comfortable at rest. HEENT: Conjunctiva and lids normal, oropharynx clear. Neck: Supple, no elevated JVP or carotid bruits, no thyromegaly. Lungs: Clear to auscultation, nonlabored breathing at rest. Cardiac: Regular rate and rhythm, no S3, 2/6 systolic murmur, paradoxically split S2. Abdomen: Soft, nontender, bowel sounds present. Extremities: Left leg prosthesis status post BKA. Skin: Warm and dry. Musculoskeletal: No kyphosis. Neuropsychiatric: Alert and oriented x3, affect grossly appropriate.  ECG: I personally reviewed the tracing from 04/04/2018 which showed sinus  bradycardia with rightward axis, IVCD, and repolarization abnormalities.  Recent Labwork:  October 2019: BUN 55, creatinine 2.58, potassium 4.2, cholesterol 159, triglycerides 46, HDL 71, LDL 79, hemoglobin A1c 7.5%  Other Studies Reviewed Today:  Echocardiogram 07/11/2018: Study Conclusions  - Left ventricle: The cavity size was normal. Wall thickness was   increased in a pattern of moderate LVH. The estimated ejection   fraction was 50%. Diffuse hypokinesis. Doppler parameters are   consistent with abnormal left ventricular relaxation (grade 1   diastolic dysfunction). - Aortic valve: Mildly to moderately calcified annulus. Trileaflet;   mildly calcified leaflets. There was moderate stenosis. Mean   gradient (S): 11 mm Hg. Peak gradient (S): 20 mm Hg. VTI ratio of   LVOT to aortic valve: 0.43. Valve area (VTI): 1.22 cm^2. Valve   area (Vmax): 1.2 cm^2. Valve area (Vmean): 1.22 cm^2. - Left atrium: The atrium was severely dilated. - Right atrium: Central venous pressure (est): 3 mm Hg. - Atrial septum: No defect or patent foramen ovale was identified. - Tricuspid valve: There was trivial regurgitation. - Pulmonary arteries: Systolic pressure could not be accurately   estimated. - Pericardium, extracardiac: There was no pericardial effusion.  Assessment and Plan:  1.  Chronic combined heart failure with nonischemic cardiomyopathy and most recent LVEF approximately 50%.  Weight has been stable on Demadex 20 mg twice daily.  She knows to take an extra dose if weight goes up 2 to 3 pounds in 24 hours or 5 pounds in a week.  Otherwise continues on Coreg, hydralazine, and Imdur.  She is not on ACE inhibitor, Aldactone or ARB in the setting of renal insufficiency.  2.  CKD stage III-IV, last creatinine 2.77.  She continues to follow with nephrology.  3.  Essential hypertension, blood pressure elevated today.  She reports compliance with her medications which are at reasonable doses.  May  need to start using clonidine as a standing medication as well.  She will continue to track blood pressure.  Current medicines were reviewed with the patient today.  Disposition: Follow-up in 6 months.  Signed, Satira Sark, MD, [facility] 12/05/2018 8:59 AM    [place] [facility] [address], [place], [place] [address] Phone: [phone]; Fax: [phone]

## 2018-12-05 NOTE — Patient Instructions (Signed)
Medication Instructions: Your physician recommends that you continue on your current medications as directed. Please refer to the Current Medication list given to you today.   Labwork: None today  Procedures/Testing: None today  Follow-Up: 6 months with Dr.McDowell  Any Additional Special Instructions Will Be Listed Below (If Applicable).     If you need a refill on your cardiac medications before your next appointment, please call your pharmacy.   

## 2018-12-14 ENCOUNTER — Telehealth: Payer: Self-pay | Admitting: Cardiology

## 2018-12-14 DIAGNOSIS — R69 Illness, unspecified: Secondary | ICD-10-CM | POA: Diagnosis not present

## 2018-12-14 DIAGNOSIS — Z79899 Other long term (current) drug therapy: Secondary | ICD-10-CM | POA: Diagnosis not present

## 2018-12-14 DIAGNOSIS — Z6835 Body mass index (BMI) 35.0-35.9, adult: Secondary | ICD-10-CM | POA: Diagnosis not present

## 2018-12-14 DIAGNOSIS — N184 Chronic kidney disease, stage 4 (severe): Secondary | ICD-10-CM | POA: Diagnosis not present

## 2018-12-14 DIAGNOSIS — E1169 Type 2 diabetes mellitus with other specified complication: Secondary | ICD-10-CM | POA: Diagnosis not present

## 2018-12-14 DIAGNOSIS — Z794 Long term (current) use of insulin: Secondary | ICD-10-CM | POA: Diagnosis not present

## 2018-12-14 DIAGNOSIS — E785 Hyperlipidemia, unspecified: Secondary | ICD-10-CM | POA: Diagnosis not present

## 2018-12-14 DIAGNOSIS — Z9714 Presence of artificial left leg (complete) (partial): Secondary | ICD-10-CM | POA: Diagnosis not present

## 2018-12-14 DIAGNOSIS — Z89512 Acquired absence of left leg below knee: Secondary | ICD-10-CM | POA: Diagnosis not present

## 2018-12-14 NOTE — Telephone Encounter (Signed)
Pt wants to inform Dr. [name] that she has had to take her clonidine two times daily to keep her blood pressure down. It has been running high for the past few weeks (180's/ 90's) but taking the clonidine has helped lower it by taking it two times daily (140's / 80's). She asked if it was safe long term to keep taking twice daily or is another medication she could try. She has no complaints, she states she has been eating good (no salt). Please advise.

## 2018-12-14 NOTE — Telephone Encounter (Signed)
Left detailed message on pt's private voicemail.(per pt)

## 2018-12-14 NOTE — Telephone Encounter (Signed)
Thank you for the update.  We talked about the fact that she might need to start using clonidine regularly better blood pressure control, so I would go ahead and remain on twice daily dosing as she is doing.

## 2018-12-14 NOTE — Telephone Encounter (Signed)
Pt wanted to let Dr. [name] know she has started to take her  cloNIDine (CATAPRES) 0.1 MG tablet [[id number]]  twice a day, one in the morning and one in the evening.   She's going to the Dr. If anyone tries to call her back, just leave a message.

## 2018-12-29 DIAGNOSIS — R69 Illness, unspecified: Secondary | ICD-10-CM | POA: Diagnosis not present

## 2019-01-12 DIAGNOSIS — R69 Illness, unspecified: Secondary | ICD-10-CM | POA: Diagnosis not present

## 2019-01-28 DIAGNOSIS — R69 Illness, unspecified: Secondary | ICD-10-CM | POA: Diagnosis not present

## 2019-02-14 DIAGNOSIS — R69 Illness, unspecified: Secondary | ICD-10-CM | POA: Diagnosis not present

## 2019-03-06 ENCOUNTER — Other Ambulatory Visit: Payer: Self-pay

## 2019-03-06 ENCOUNTER — Encounter (INDEPENDENT_AMBULATORY_CARE_PROVIDER_SITE_OTHER): Payer: Medicare HMO | Admitting: Ophthalmology

## 2019-03-06 DIAGNOSIS — I10 Essential (primary) hypertension: Secondary | ICD-10-CM | POA: Diagnosis not present

## 2019-03-06 DIAGNOSIS — H43813 Vitreous degeneration, bilateral: Secondary | ICD-10-CM | POA: Diagnosis not present

## 2019-03-06 DIAGNOSIS — E113592 Type 2 diabetes mellitus with proliferative diabetic retinopathy without macular edema, left eye: Secondary | ICD-10-CM | POA: Diagnosis not present

## 2019-03-06 DIAGNOSIS — E113511 Type 2 diabetes mellitus with proliferative diabetic retinopathy with macular edema, right eye: Secondary | ICD-10-CM | POA: Diagnosis not present

## 2019-03-06 DIAGNOSIS — H35341 Macular cyst, hole, or pseudohole, right eye: Secondary | ICD-10-CM

## 2019-03-06 DIAGNOSIS — E11311 Type 2 diabetes mellitus with unspecified diabetic retinopathy with macular edema: Secondary | ICD-10-CM | POA: Diagnosis not present

## 2019-03-06 DIAGNOSIS — H35033 Hypertensive retinopathy, bilateral: Secondary | ICD-10-CM | POA: Diagnosis not present

## 2019-03-07 ENCOUNTER — Other Ambulatory Visit: Payer: Self-pay | Admitting: Cardiology

## 2019-03-08 DIAGNOSIS — E1165 Type 2 diabetes mellitus with hyperglycemia: Secondary | ICD-10-CM | POA: Diagnosis not present

## 2019-03-08 DIAGNOSIS — E785 Hyperlipidemia, unspecified: Secondary | ICD-10-CM | POA: Diagnosis not present

## 2019-03-08 DIAGNOSIS — I10 Essential (primary) hypertension: Secondary | ICD-10-CM | POA: Diagnosis not present

## 2019-03-18 DIAGNOSIS — R69 Illness, unspecified: Secondary | ICD-10-CM | POA: Diagnosis not present

## 2019-03-23 DIAGNOSIS — I129 Hypertensive chronic kidney disease with stage 1 through stage 4 chronic kidney disease, or unspecified chronic kidney disease: Secondary | ICD-10-CM | POA: Diagnosis not present

## 2019-03-23 DIAGNOSIS — R69 Illness, unspecified: Secondary | ICD-10-CM | POA: Diagnosis not present

## 2019-03-23 DIAGNOSIS — E1165 Type 2 diabetes mellitus with hyperglycemia: Secondary | ICD-10-CM | POA: Diagnosis not present

## 2019-03-23 DIAGNOSIS — Z794 Long term (current) use of insulin: Secondary | ICD-10-CM | POA: Diagnosis not present

## 2019-03-23 DIAGNOSIS — Z9714 Presence of artificial left leg (complete) (partial): Secondary | ICD-10-CM | POA: Diagnosis not present

## 2019-03-23 DIAGNOSIS — Z79899 Other long term (current) drug therapy: Secondary | ICD-10-CM | POA: Diagnosis not present

## 2019-03-23 DIAGNOSIS — E785 Hyperlipidemia, unspecified: Secondary | ICD-10-CM | POA: Diagnosis not present

## 2019-03-23 DIAGNOSIS — Z89512 Acquired absence of left leg below knee: Secondary | ICD-10-CM | POA: Diagnosis not present

## 2019-03-23 DIAGNOSIS — N184 Chronic kidney disease, stage 4 (severe): Secondary | ICD-10-CM | POA: Diagnosis not present

## 2019-03-23 DIAGNOSIS — I509 Heart failure, unspecified: Secondary | ICD-10-CM | POA: Diagnosis not present

## 2019-04-13 DIAGNOSIS — I10 Essential (primary) hypertension: Secondary | ICD-10-CM | POA: Diagnosis not present

## 2019-04-13 DIAGNOSIS — N183 Chronic kidney disease, stage 3 (moderate): Secondary | ICD-10-CM | POA: Diagnosis not present

## 2019-04-13 DIAGNOSIS — Z79899 Other long term (current) drug therapy: Secondary | ICD-10-CM | POA: Diagnosis not present

## 2019-04-13 DIAGNOSIS — E559 Vitamin D deficiency, unspecified: Secondary | ICD-10-CM | POA: Diagnosis not present

## 2019-04-13 DIAGNOSIS — D649 Anemia, unspecified: Secondary | ICD-10-CM | POA: Diagnosis not present

## 2019-04-13 DIAGNOSIS — R809 Proteinuria, unspecified: Secondary | ICD-10-CM | POA: Diagnosis not present

## 2019-04-23 DIAGNOSIS — R69 Illness, unspecified: Secondary | ICD-10-CM | POA: Diagnosis not present

## 2019-05-07 ENCOUNTER — Other Ambulatory Visit: Payer: Self-pay | Admitting: Cardiology

## 2019-05-24 DIAGNOSIS — I10 Essential (primary) hypertension: Secondary | ICD-10-CM | POA: Diagnosis not present

## 2019-05-24 DIAGNOSIS — Z9714 Presence of artificial left leg (complete) (partial): Secondary | ICD-10-CM | POA: Diagnosis not present

## 2019-05-24 DIAGNOSIS — Z89512 Acquired absence of left leg below knee: Secondary | ICD-10-CM | POA: Diagnosis not present

## 2019-05-28 DIAGNOSIS — R69 Illness, unspecified: Secondary | ICD-10-CM | POA: Diagnosis not present

## 2019-06-13 ENCOUNTER — Ambulatory Visit: Payer: Medicare HMO | Admitting: Cardiology

## 2019-06-13 ENCOUNTER — Encounter: Payer: Self-pay | Admitting: Cardiology

## 2019-06-13 ENCOUNTER — Other Ambulatory Visit: Payer: Self-pay

## 2019-06-13 VITALS — BP 120/57 | HR 59 | Temp 97.3°F | Ht 61.0 in | Wt 192.0 lb

## 2019-06-13 DIAGNOSIS — N184 Chronic kidney disease, stage 4 (severe): Secondary | ICD-10-CM

## 2019-06-13 DIAGNOSIS — I10 Essential (primary) hypertension: Secondary | ICD-10-CM | POA: Diagnosis not present

## 2019-06-13 DIAGNOSIS — I428 Other cardiomyopathies: Secondary | ICD-10-CM | POA: Diagnosis not present

## 2019-06-13 NOTE — Patient Instructions (Signed)
Medication Instructions: Your physician recommends that you continue on your current medications as directed. Please refer to the Current Medication list given to you today.   Labwork: None today  Procedures/Testing: Your physician has requested that you have an echocardiogram. Echocardiography is a painless test that uses sound waves to create images of your heart. It provides your doctor with information about the size and shape of your heart and how well your heart's chambers and valves are working. This procedure takes approximately one hour. There are no restrictions for this procedure.    Follow-Up: 4 months with Dr.McDowell  Any Additional Special Instructions Will Be Listed Below (If Applicable).     If you need a refill on your cardiac medications before your next appointment, please call your pharmacy.        Thank you for choosing [facility] !

## 2019-06-13 NOTE — Progress Notes (Signed)
Cardiology Office Note  Date: 06/13/2019   ID: [name], DOB 1946-09-07, MRN [id number]  PCP:  Jani Gravel, MD  Cardiologist:  Rozann Lesches, MD Electrophysiologist:  None   Chief Complaint  Patient presents with  . Cardiac follow-up    History of Present Illness: [name] is a 73 y.o. female last seen in March.  She presents for a routine visit.  Overall no major change in status.  She has not been very active, staying at home mostly during the pandemic.  Still uses a walker when she gets out.  She states that her children look in on her regularly.  Her weight is up about 4 pounds.  She does not report any orthopnea.  We reviewed her medications which are listed below.  She remains on multimodal antihypertensive therapy and is been using clonidine regularly rather than as needed.  No change in diuretic dosing.  We are requesting her interval lab work from nephrology.  Her last echocardiogram was in October 2019 at which point LVEF was approximately 50%.  We discussed obtaining a follow-up study.  Past Medical History:  Diagnosis Date  . Anemia   . Anxiety   . Chronic systolic heart failure ([place])   . Coronary atherosclerosis of native coronary artery    Nonobstructive  . Depression   . Diabetes mellitus, type 2 ([place])   . Essential hypertension, benign   . MRSA bacteremia January 2015   Associated with probable tricuspid valve vegetation, ICD system extracted February 2015  . Nonischemic cardiomyopathy (HCC)    LVEF 20% up to 45%  . PAD (peripheral artery disease) (HCC)    Left foot gangrene    Past Surgical History:  Procedure Laterality Date  . [place] VITRECTOMY WITH 20 GAUGE MVR PORT FOR MACULAR HOLE Right 11/10/2012   Procedure: 25 GAUGE PARS PLANA VITRECTOMY WITH 20 GAUGE MVR PORT FOR MACULAR HOLE;  Surgeon: Hayden Pedro, MD;  Location: [place];  Service: Ophthalmology;  Laterality: Right;  . 25 GAUGE PARS PLANA VITRECTOMY WITH 20  GAUGE MVR PORT FOR MACULAR HOLE Right 05/09/2013   Procedure: 25 GAUGE PARS PLANA VITRECTOMY WITH 20 GAUGE MVR PORT FOR MACULAR HOLE;  Surgeon: Hayden Pedro, MD;  Location: [place];  Service: Ophthalmology;  Laterality: Right;  . ABDOMINAL HYSTERECTOMY    . AMPUTATION Left 03/23/2014   Procedure: Left Foot 1st Ray Amputation;  Surgeon: Newt Minion, MD;  Location: [place];  Service: Orthopedics;  Laterality: Left;  Left Foot 1st Ray Amputation  . AMPUTATION Left 04/13/2014   Procedure: AMPUTATION BELOW KNEE;  Surgeon: Newt Minion, MD;  Location: [place];  Service: Orthopedics;  Laterality: Left;  Left Below Knee Amputation  . BLADDER SURGERY  2011 BENIGN MASS REMOVED FROM BLADDER  . BREAST LUMPECTOMY     Bil  . CARDIAC DEFIBRILLATOR PLACEMENT     Medtronic [id number]   . CENTRAL VENOUS CATHETER INSERTION Right 11/09/2013   Procedure: INSERTION CENTRAL LINE ADULT;  Surgeon: Evans Lance, MD;  Location: [place];  Service: Cardiovascular;  Laterality: Right;  . Exploratory laparotomy with lysis of adhesions    . EYE SURGERY  BILATERAL CATARACTS REMOVAL  10 YEARS AGO PER PATIENT  . GAS INSERTION Right 11/10/2012   Procedure: INSERTION OF GAS;  Surgeon: Hayden Pedro, MD;  Location: [place];  Service: Ophthalmology;  Laterality: Right;  C3F8  . GAS/FLUID EXCHANGE Right 05/09/2013   Procedure: GAS/FLUID EXCHANGE;  Surgeon: Hayden Pedro, MD;  Location: [place];  Service: Ophthalmology;  Laterality: Right;  C3F8   . ICD LEAD REMOVAL N/A 11/09/2013   Procedure: ICD LEAD REMOVAL;  Surgeon: Evans Lance, MD;  Location: [place];  Service: Cardiovascular;  Laterality: N/A;  . LASER PHOTO ABLATION Right 05/09/2013   Procedure: LASER PHOTO ABLATION;  Surgeon: Hayden Pedro, MD;  Location: [place];  Service: Ophthalmology;  Laterality: Right;  Endolaser   . Left shoulder surgery     Rotator Cuff  . MEMBRANE PEEL Right 05/09/2013   Procedure: MEMBRANE PEEL;  Surgeon: Hayden Pedro, MD;  Location: [place];  Service:  Ophthalmology;  Laterality: Right;  . PR VEIN BYPASS GRAFT,AORTO-FEM-POP  05-2011   Left popliteal- PTA graft   . SERUM PATCH Right 11/10/2012   Procedure: SERUM PATCH;  Surgeon: Hayden Pedro, MD;  Location: [place];  Service: Ophthalmology;  Laterality: Right;  . SERUM PATCH Right 05/09/2013   Procedure: SERUM PATCH;  Surgeon: Hayden Pedro, MD;  Location: [place];  Service: Ophthalmology;  Laterality: Right;  . STUMP REVISION Left 05/30/2014   Procedure: Left Below Knee Amputation Revision;  Surgeon: Newt Minion, MD;  Location: [place];  Service: Orthopedics;  Laterality: Left;  . TOE AMPUTATION Left    great toe  . Viterectomy  11/10/2012   OD   Dr Zigmund Daniel    Current Outpatient Medications  Medication Sig Dispense Refill  . ALPRAZolam (XANAX) 0.5 MG tablet Take one tablet by mouth twice daily as needed for anxiety    . amLODipine (NORVASC) 10 MG tablet TAKE 1 TABLET BY MOUTH EVERY DAY 90 tablet 3  . aspirin 81 MG tablet Take 81 mg by mouth daily.     . calcitRIOL (ROCALTROL) 0.25 MCG capsule Take 0.25 mcg by mouth daily.     . carvedilol (COREG) 25 MG tablet TAKE 1 TABLET BY MOUTH TWICE A DAY 180 tablet 3  . Cholecalciferol (VITAMIN D-3) 1000 UNITS CAPS Take 1,000 Units by mouth every other day.     . cloNIDine (CATAPRES) 0.1 MG tablet TAKE 1 TABLET BY MOUTH DAILY AS NEEDED FOR SYSTOLIC PRESSURE GREATER THAN 180 90 tablet 7  . docusate sodium (COLACE) 100 MG capsule Take 100 mg by mouth daily as needed (constipation).     . hydrALAZINE (APRESOLINE) 100 MG tablet TAKE 1 TABLET BY MOUTH 3 TIMES A DAY 270 tablet 2  . Insulin Glargine (BASAGLAR KWIKPEN) 100 UNIT/ML SOPN Inject 30 Units into the skin at bedtime.    . insulin lispro (HUMALOG) 100 UNIT/ML injection Inject 0-8 Units into the skin every evening. Sliding scale: 200-249=2 units, 250-299=4 units, 300-349=6 units, >350=8 units    . isosorbide mononitrate (IMDUR) 30 MG 24 hr tablet TAKE 1 TABLET BY MOUTH TWICE A DAY 180 tablet 2  .  loratadine (CLARITIN) 10 MG tablet Take 10 mg by mouth daily as needed.  11  . nitroGLYCERIN (NITROSTAT) 0.4 MG SL tablet Place 1 tablet (0.4 mg total) under the tongue every 5 (five) minutes as needed for chest pain. 25 tablet 3  . polyethylene glycol (MIRALAX / GLYCOLAX) packet Take 17 g by mouth daily as needed (constipation).     . rosuvastatin (CRESTOR) 10 MG tablet Take 1 tablet by mouth daily.    . sodium bicarbonate 650 MG tablet Take 1,300 mg by mouth 2 (two) times daily.     Marland Kitchen torsemide (DEMADEX) 20 MG tablet Take 20 mg by mouth 2 (  two) times daily. May take an extra tablet as needed for leg swelling     No current facility-administered medications for this visit.    Allergies:  Hydrocodone-acetaminophen   Social History: The patient  reports that she has never smoked. She has never used smokeless tobacco. She reports that she does not drink alcohol or use drugs.   ROS:  Please see the history of present illness. Otherwise, complete review of systems is positive for none.  All other systems are reviewed and negative.   Physical Exam: VS:  BP (!) 120/57   Pulse (!) 59   Temp (!) 97.3 F (36.3 C)   Ht 5\' 1"  (1.549 m)   Wt 192 lb (87.1 kg)   SpO2 94%   BMI 36.28 kg/m , BMI Body mass index is 36.28 kg/m.  Wt Readings from Last 3 Encounters:  06/13/19 192 lb (87.1 kg)  12/05/18 188 lb 9.6 oz (85.5 kg)  08/01/18 186 lb (84.4 kg)    General: Patient appears comfortable at rest. HEENT: Conjunctiva and lids normal, wearing a mask. Neck: Supple, no elevated JVP or carotid bruits, no thyromegaly. Lungs: Clear to auscultation, nonlabored breathing at rest. Cardiac: Regular rate and rhythm, no S3, 2/6 systolic murmur. Abdomen: Soft, nontender, bowel sounds present. Extremities: Left leg prosthesis status post BKA. Skin: Warm and dry. Musculoskeletal: No kyphosis. Neuropsychiatric: Alert and oriented x3, affect grossly appropriate.  ECG:  An ECG dated 04/04/2018 was personally  reviewed today and demonstrated:  Sinus bradycardia with rightward axis, IVCD, repolarization abnormalities.  Recent Labwork:  December 2019: BUN 48, creatinine 2.77, potassium 4.2, cholesterol 170, triglycerides 80, HDL 77, LDL 81, hemoglobin A1c 8.6%  Other Studies Reviewed Today:  Echocardiogram 07/11/2018: Study Conclusions  - Left ventricle: The cavity size was normal. Wall thickness was increased in a pattern of moderate LVH. The estimated ejection fraction was 50%. Diffuse hypokinesis. Doppler parameters are consistent with abnormal left ventricular relaxation (grade 1 diastolic dysfunction). - Aortic valve: Mildly to moderately calcified annulus. Trileaflet; mildly calcified leaflets. There was moderate stenosis. Mean gradient (S): 11 mm Hg. Peak gradient (S): 20 mm Hg. VTI ratio of LVOT to aortic valve: 0.43. Valve area (VTI): 1.22 cm^2. Valve area (Vmax): 1.2 cm^2. Valve area (Vmean): 1.22 cm^2. - Left atrium: The atrium was severely dilated. - Right atrium: Central venous pressure (est): 3 mm Hg. - Atrial septum: No defect or patent foramen ovale was identified. - Tricuspid valve: There was trivial regurgitation. - Pulmonary arteries: Systolic pressure could not be accurately estimated. - Pericardium, extracardiac: There was no pericardial effusion.  Assessment and Plan:  1.  Nonischemic cardiomyopathy with LVEF improved to the range of 50% on medical therapy as of October 2019.  No changes made to present regimen.  She has extra Demadex to use as needed based on weight change.  Plan to obtain a follow-up echocardiogram for reevaluation.  She is not on ACE inhibitor/ARB/ANRI/Aldactone in light of degree of renal insufficiency.  2.  CKD stage IV, creatinine 2.77.  Requesting interval lab work from nephrology.  3.  Essential hypertension with fluctuating blood pressure control on multimodal therapy.  She has been using clonidine on a standing basis in  addition to remaining regimen.  No changes made to present regimen.  Medication Adjustments/Labs and Tests Ordered: Current medicines are reviewed at length with the patient today.  Concerns regarding medicines are outlined above.   Tests Ordered: Orders Placed This Encounter  Procedures  . ECHOCARDIOGRAM COMPLETE  Medication Changes: No orders of the defined types were placed in this encounter.   Disposition:  Follow up 4 months in the [place] office.  Signed, Satira Sark, MD, [facility] 06/13/2019 8:27 AM    [place] at [place]. [address], [place], [place] [address] Phone: [phone]; Fax: [phone]

## 2019-06-18 ENCOUNTER — Other Ambulatory Visit: Payer: Self-pay | Admitting: Cardiology

## 2019-06-21 ENCOUNTER — Ambulatory Visit (HOSPITAL_COMMUNITY)
Admission: RE | Admit: 2019-06-21 | Discharge: 2019-06-21 | Disposition: A | Discharge status: Home / Self Care | Payer: Medicare HMO | Source: Ambulatory Visit | Attending: Cardiology | Admitting: Cardiology

## 2019-06-21 ENCOUNTER — Other Ambulatory Visit: Payer: Self-pay

## 2019-06-21 DIAGNOSIS — E1165 Type 2 diabetes mellitus with hyperglycemia: Secondary | ICD-10-CM | POA: Diagnosis not present

## 2019-06-21 DIAGNOSIS — E785 Hyperlipidemia, unspecified: Secondary | ICD-10-CM | POA: Diagnosis not present

## 2019-06-21 DIAGNOSIS — N185 Chronic kidney disease, stage 5: Secondary | ICD-10-CM | POA: Diagnosis not present

## 2019-06-21 DIAGNOSIS — I428 Other cardiomyopathies: Secondary | ICD-10-CM | POA: Diagnosis not present

## 2019-06-21 DIAGNOSIS — I10 Essential (primary) hypertension: Secondary | ICD-10-CM | POA: Diagnosis not present

## 2019-06-21 DIAGNOSIS — Z79899 Other long term (current) drug therapy: Secondary | ICD-10-CM | POA: Diagnosis not present

## 2019-06-21 NOTE — Progress Notes (Signed)
*  PRELIMINARY RESULTS* Echocardiogram 2D Echocardiogram has been performed.  [name] 06/21/2019, 8:57 AM

## 2019-06-22 ENCOUNTER — Other Ambulatory Visit: Payer: Self-pay

## 2019-06-22 MED ORDER — CLONIDINE HCL 0.1 MG PO TABS: ORAL_TABLET | ORAL | 7 refills | Status: DC

## 2019-06-22 NOTE — Telephone Encounter (Signed)
refilled clonidine, pt states she takes 0.1 mg daily as BP is usually up

## 2019-06-26 DIAGNOSIS — I509 Heart failure, unspecified: Secondary | ICD-10-CM | POA: Diagnosis not present

## 2019-06-26 DIAGNOSIS — Z0001 Encounter for general adult medical examination with abnormal findings: Secondary | ICD-10-CM | POA: Diagnosis not present

## 2019-06-26 DIAGNOSIS — E785 Hyperlipidemia, unspecified: Secondary | ICD-10-CM | POA: Diagnosis not present

## 2019-06-26 DIAGNOSIS — N183 Chronic kidney disease, stage 3 (moderate): Secondary | ICD-10-CM | POA: Diagnosis not present

## 2019-06-26 DIAGNOSIS — Z794 Long term (current) use of insulin: Secondary | ICD-10-CM | POA: Diagnosis not present

## 2019-06-26 DIAGNOSIS — E1169 Type 2 diabetes mellitus with other specified complication: Secondary | ICD-10-CM | POA: Diagnosis not present

## 2019-06-26 DIAGNOSIS — J309 Allergic rhinitis, unspecified: Secondary | ICD-10-CM | POA: Diagnosis not present

## 2019-06-26 DIAGNOSIS — R69 Illness, unspecified: Secondary | ICD-10-CM | POA: Diagnosis not present

## 2019-06-26 DIAGNOSIS — Z89512 Acquired absence of left leg below knee: Secondary | ICD-10-CM | POA: Diagnosis not present

## 2019-06-26 DIAGNOSIS — I129 Hypertensive chronic kidney disease with stage 1 through stage 4 chronic kidney disease, or unspecified chronic kidney disease: Secondary | ICD-10-CM | POA: Diagnosis not present

## 2019-06-28 DIAGNOSIS — R69 Illness, unspecified: Secondary | ICD-10-CM | POA: Diagnosis not present

## 2019-07-04 DIAGNOSIS — Z89512 Acquired absence of left leg below knee: Secondary | ICD-10-CM | POA: Diagnosis not present

## 2019-07-13 ENCOUNTER — Other Ambulatory Visit: Payer: Self-pay | Admitting: Cardiology

## 2019-07-21 DIAGNOSIS — R69 Illness, unspecified: Secondary | ICD-10-CM | POA: Diagnosis not present

## 2019-08-06 DIAGNOSIS — R69 Illness, unspecified: Secondary | ICD-10-CM | POA: Diagnosis not present

## 2019-08-16 DIAGNOSIS — E559 Vitamin D deficiency, unspecified: Secondary | ICD-10-CM | POA: Diagnosis not present

## 2019-08-16 DIAGNOSIS — I10 Essential (primary) hypertension: Secondary | ICD-10-CM | POA: Diagnosis not present

## 2019-08-16 DIAGNOSIS — D649 Anemia, unspecified: Secondary | ICD-10-CM | POA: Diagnosis not present

## 2019-08-16 DIAGNOSIS — N183 Chronic kidney disease, stage 3 unspecified: Secondary | ICD-10-CM | POA: Diagnosis not present

## 2019-08-16 DIAGNOSIS — R809 Proteinuria, unspecified: Secondary | ICD-10-CM | POA: Diagnosis not present

## 2019-08-16 DIAGNOSIS — Z79899 Other long term (current) drug therapy: Secondary | ICD-10-CM | POA: Diagnosis not present

## 2019-08-23 DIAGNOSIS — N184 Chronic kidney disease, stage 4 (severe): Secondary | ICD-10-CM | POA: Diagnosis not present

## 2019-08-23 DIAGNOSIS — E1122 Type 2 diabetes mellitus with diabetic chronic kidney disease: Secondary | ICD-10-CM | POA: Diagnosis not present

## 2019-08-23 DIAGNOSIS — E1129 Type 2 diabetes mellitus with other diabetic kidney complication: Secondary | ICD-10-CM | POA: Diagnosis not present

## 2019-08-23 DIAGNOSIS — N189 Chronic kidney disease, unspecified: Secondary | ICD-10-CM | POA: Diagnosis not present

## 2019-08-23 DIAGNOSIS — E211 Secondary hyperparathyroidism, not elsewhere classified: Secondary | ICD-10-CM | POA: Diagnosis not present

## 2019-08-23 DIAGNOSIS — R809 Proteinuria, unspecified: Secondary | ICD-10-CM | POA: Diagnosis not present

## 2019-08-23 DIAGNOSIS — I129 Hypertensive chronic kidney disease with stage 1 through stage 4 chronic kidney disease, or unspecified chronic kidney disease: Secondary | ICD-10-CM | POA: Diagnosis not present

## 2019-09-10 DIAGNOSIS — R69 Illness, unspecified: Secondary | ICD-10-CM | POA: Diagnosis not present

## 2019-09-11 ENCOUNTER — Encounter (INDEPENDENT_AMBULATORY_CARE_PROVIDER_SITE_OTHER): Payer: Medicare HMO | Admitting: Ophthalmology

## 2019-09-11 DIAGNOSIS — H35341 Macular cyst, hole, or pseudohole, right eye: Secondary | ICD-10-CM | POA: Diagnosis not present

## 2019-09-11 DIAGNOSIS — H43813 Vitreous degeneration, bilateral: Secondary | ICD-10-CM

## 2019-09-11 DIAGNOSIS — E11311 Type 2 diabetes mellitus with unspecified diabetic retinopathy with macular edema: Secondary | ICD-10-CM

## 2019-09-11 DIAGNOSIS — E113511 Type 2 diabetes mellitus with proliferative diabetic retinopathy with macular edema, right eye: Secondary | ICD-10-CM | POA: Diagnosis not present

## 2019-09-11 DIAGNOSIS — I10 Essential (primary) hypertension: Secondary | ICD-10-CM | POA: Diagnosis not present

## 2019-09-11 DIAGNOSIS — H35033 Hypertensive retinopathy, bilateral: Secondary | ICD-10-CM | POA: Diagnosis not present

## 2019-09-11 DIAGNOSIS — E113592 Type 2 diabetes mellitus with proliferative diabetic retinopathy without macular edema, left eye: Secondary | ICD-10-CM

## 2019-09-15 DIAGNOSIS — I10 Essential (primary) hypertension: Secondary | ICD-10-CM | POA: Diagnosis not present

## 2019-09-15 DIAGNOSIS — E785 Hyperlipidemia, unspecified: Secondary | ICD-10-CM | POA: Diagnosis not present

## 2019-09-15 DIAGNOSIS — E1165 Type 2 diabetes mellitus with hyperglycemia: Secondary | ICD-10-CM | POA: Diagnosis not present

## 2019-09-25 DIAGNOSIS — Z794 Long term (current) use of insulin: Secondary | ICD-10-CM | POA: Diagnosis not present

## 2019-09-25 DIAGNOSIS — N184 Chronic kidney disease, stage 4 (severe): Secondary | ICD-10-CM | POA: Diagnosis not present

## 2019-09-25 DIAGNOSIS — Z89512 Acquired absence of left leg below knee: Secondary | ICD-10-CM | POA: Diagnosis not present

## 2019-09-25 DIAGNOSIS — R69 Illness, unspecified: Secondary | ICD-10-CM | POA: Diagnosis not present

## 2019-09-25 DIAGNOSIS — I509 Heart failure, unspecified: Secondary | ICD-10-CM | POA: Diagnosis not present

## 2019-09-25 DIAGNOSIS — Z79899 Other long term (current) drug therapy: Secondary | ICD-10-CM | POA: Diagnosis not present

## 2019-09-25 DIAGNOSIS — E785 Hyperlipidemia, unspecified: Secondary | ICD-10-CM | POA: Diagnosis not present

## 2019-09-25 DIAGNOSIS — Z9714 Presence of artificial left leg (complete) (partial): Secondary | ICD-10-CM | POA: Diagnosis not present

## 2019-09-25 DIAGNOSIS — I129 Hypertensive chronic kidney disease with stage 1 through stage 4 chronic kidney disease, or unspecified chronic kidney disease: Secondary | ICD-10-CM | POA: Diagnosis not present

## 2019-09-25 DIAGNOSIS — E119 Type 2 diabetes mellitus without complications: Secondary | ICD-10-CM | POA: Diagnosis not present

## 2019-10-04 DIAGNOSIS — R69 Illness, unspecified: Secondary | ICD-10-CM | POA: Diagnosis not present

## 2019-10-05 ENCOUNTER — Ambulatory Visit: Payer: Medicare HMO | Attending: Internal Medicine

## 2019-10-05 ENCOUNTER — Other Ambulatory Visit: Payer: Self-pay

## 2019-10-05 DIAGNOSIS — Z20822 Contact with and (suspected) exposure to covid-19: Secondary | ICD-10-CM

## 2019-10-05 DIAGNOSIS — Z20828 Contact with and (suspected) exposure to other viral communicable diseases: Secondary | ICD-10-CM | POA: Diagnosis not present

## 2019-10-07 LAB — NOVEL CORONAVIRUS, NAA: SARS-CoV-2, NAA: NOT DETECTED

## 2019-10-17 DIAGNOSIS — D631 Anemia in chronic kidney disease: Secondary | ICD-10-CM | POA: Diagnosis not present

## 2019-10-17 DIAGNOSIS — E559 Vitamin D deficiency, unspecified: Secondary | ICD-10-CM | POA: Diagnosis not present

## 2019-10-17 DIAGNOSIS — R809 Proteinuria, unspecified: Secondary | ICD-10-CM | POA: Diagnosis not present

## 2019-10-17 DIAGNOSIS — N1832 Chronic kidney disease, stage 3b: Secondary | ICD-10-CM | POA: Diagnosis not present

## 2019-10-17 DIAGNOSIS — Z79899 Other long term (current) drug therapy: Secondary | ICD-10-CM | POA: Diagnosis not present

## 2019-10-18 DIAGNOSIS — R69 Illness, unspecified: Secondary | ICD-10-CM | POA: Diagnosis not present

## 2019-10-22 DIAGNOSIS — R69 Illness, unspecified: Secondary | ICD-10-CM | POA: Diagnosis not present

## 2019-10-23 ENCOUNTER — Other Ambulatory Visit (HOSPITAL_COMMUNITY): Payer: Self-pay | Admitting: Nephrology

## 2019-10-23 ENCOUNTER — Other Ambulatory Visit: Payer: Self-pay | Admitting: Nephrology

## 2019-10-23 DIAGNOSIS — E211 Secondary hyperparathyroidism, not elsewhere classified: Secondary | ICD-10-CM | POA: Diagnosis not present

## 2019-10-23 DIAGNOSIS — N170 Acute kidney failure with tubular necrosis: Secondary | ICD-10-CM

## 2019-10-23 DIAGNOSIS — E1129 Type 2 diabetes mellitus with other diabetic kidney complication: Secondary | ICD-10-CM | POA: Diagnosis not present

## 2019-10-23 DIAGNOSIS — D631 Anemia in chronic kidney disease: Secondary | ICD-10-CM | POA: Diagnosis not present

## 2019-10-23 DIAGNOSIS — E1122 Type 2 diabetes mellitus with diabetic chronic kidney disease: Secondary | ICD-10-CM | POA: Diagnosis not present

## 2019-10-23 DIAGNOSIS — I129 Hypertensive chronic kidney disease with stage 1 through stage 4 chronic kidney disease, or unspecified chronic kidney disease: Secondary | ICD-10-CM | POA: Diagnosis not present

## 2019-10-23 DIAGNOSIS — N189 Chronic kidney disease, unspecified: Secondary | ICD-10-CM | POA: Diagnosis not present

## 2019-10-23 DIAGNOSIS — R809 Proteinuria, unspecified: Secondary | ICD-10-CM | POA: Diagnosis not present

## 2019-10-30 ENCOUNTER — Other Ambulatory Visit: Payer: Self-pay

## 2019-10-30 ENCOUNTER — Ambulatory Visit (HOSPITAL_COMMUNITY)
Admission: RE | Admit: 2019-10-30 | Discharge: 2019-10-30 | Disposition: A | Discharge status: Home / Self Care | Payer: Medicare HMO | Source: Ambulatory Visit | Attending: Nephrology | Admitting: Nephrology

## 2019-10-30 DIAGNOSIS — N170 Acute kidney failure with tubular necrosis: Secondary | ICD-10-CM | POA: Diagnosis not present

## 2019-11-20 DIAGNOSIS — E1122 Type 2 diabetes mellitus with diabetic chronic kidney disease: Secondary | ICD-10-CM | POA: Diagnosis not present

## 2019-11-20 DIAGNOSIS — R809 Proteinuria, unspecified: Secondary | ICD-10-CM | POA: Diagnosis not present

## 2019-11-20 DIAGNOSIS — E1129 Type 2 diabetes mellitus with other diabetic kidney complication: Secondary | ICD-10-CM | POA: Diagnosis not present

## 2019-11-20 DIAGNOSIS — N170 Acute kidney failure with tubular necrosis: Secondary | ICD-10-CM | POA: Diagnosis not present

## 2019-11-20 DIAGNOSIS — D631 Anemia in chronic kidney disease: Secondary | ICD-10-CM | POA: Diagnosis not present

## 2019-11-20 DIAGNOSIS — I129 Hypertensive chronic kidney disease with stage 1 through stage 4 chronic kidney disease, or unspecified chronic kidney disease: Secondary | ICD-10-CM | POA: Diagnosis not present

## 2019-11-20 DIAGNOSIS — E211 Secondary hyperparathyroidism, not elsewhere classified: Secondary | ICD-10-CM | POA: Diagnosis not present

## 2019-11-20 DIAGNOSIS — N189 Chronic kidney disease, unspecified: Secondary | ICD-10-CM | POA: Diagnosis not present

## 2019-11-28 ENCOUNTER — Telehealth: Payer: Self-pay

## 2019-11-28 DIAGNOSIS — R69 Illness, unspecified: Secondary | ICD-10-CM | POA: Diagnosis not present

## 2019-11-28 NOTE — Telephone Encounter (Signed)

## 2019-12-07 NOTE — Progress Notes (Signed)
Virtual Visit via Telephone Note   This visit type was conducted due to national recommendations for restrictions regarding the COVID-19 Pandemic (e.g. social distancing) in an effort to limit this patient's exposure and mitigate transmission in our community.  Due to her co-morbid illnesses, this patient is at least at moderate risk for complications without adequate follow up.  This format is felt to be most appropriate for this patient at this time.  The patient did not have access to video technology/had technical difficulties with video requiring transitioning to audio format only (telephone).  All issues noted in this document were discussed and addressed.  No physical exam could be performed with this format.  Please refer to the patient's chart for her  consent to telehealth for [facility].  The patient was identified using 2 identifiers.  Date:  12/08/2019   ID:  [name], DOB 1946-03-27, MRN [id number]  Patient Location: Home Provider Location: Office  PCP:  Sharon Gravel, MD  Cardiologist:  Sharon Lesches, MD Electrophysiologist:  None   Evaluation Performed:  Follow-Up Visit  Chief Complaint:  Cardiac follow-up  History of Present Illness:    [name] is a 74 y.o. female last seen in September 2020.  We spoke by phone today.  She has been doing well overall, has lost weight through a combination of diet, decreased fluid intake, and also an increase in her Demadex made by nephrologist to 40 mg twice daily.  She had lab work obtained in February which we are requesting for review.  She states that she does not have any chest pain with exertion, no orthopnea or PND.  Follow-up echocardiogram in September 2020 showed LVEF 50% with mild diffuse hypokinesis and increased filling pressure, mild to moderate aortic stenosis.  I reviewed the results with her today.  The patient does not have symptoms concerning for COVID-19 infection (fever, chills, cough, or new  shortness of breath).  She has received the first dose of the vaccine.   Past Medical History:  Diagnosis Date  . Anemia   . Anxiety   . Chronic systolic heart failure ([place])   . Coronary atherosclerosis of native coronary artery    Nonobstructive  . Depression   . Diabetes mellitus, type 2 ([place])   . Essential hypertension, benign   . MRSA bacteremia January 2015   Associated with probable tricuspid valve vegetation, ICD system extracted February 2015  . Nonischemic cardiomyopathy (HCC)    LVEF 20% up to 45%  . PAD (peripheral artery disease) (HCC)    Left foot gangrene   Past Surgical History:  Procedure Laterality Date  . [place] VITRECTOMY WITH 20 GAUGE MVR PORT FOR MACULAR HOLE Right 11/10/2012   Procedure: 25 GAUGE PARS PLANA VITRECTOMY WITH 20 GAUGE MVR PORT FOR MACULAR HOLE;  Surgeon: Hayden Pedro, MD;  Location: [place];  Service: Ophthalmology;  Laterality: Right;  . 25 GAUGE PARS PLANA VITRECTOMY WITH 20 GAUGE MVR PORT FOR MACULAR HOLE Right 05/09/2013   Procedure: 25 GAUGE PARS PLANA VITRECTOMY WITH 20 GAUGE MVR PORT FOR MACULAR HOLE;  Surgeon: Hayden Pedro, MD;  Location: [place];  Service: Ophthalmology;  Laterality: Right;  . ABDOMINAL HYSTERECTOMY    . AMPUTATION Left 03/23/2014   Procedure: Left Foot 1st Ray Amputation;  Surgeon: Newt Minion, MD;  Location: [place];  Service: Orthopedics;  Laterality: Left;  Left Foot 1st Ray Amputation  . AMPUTATION Left 04/13/2014   Procedure: AMPUTATION BELOW KNEE;  Surgeon: Newt Minion, MD;  Location: [place];  Service: Orthopedics;  Laterality: Left;  Left Below Knee Amputation  . BLADDER SURGERY  2011 BENIGN MASS REMOVED FROM BLADDER  . BREAST LUMPECTOMY     Bil  . CARDIAC DEFIBRILLATOR PLACEMENT     Medtronic [id number]   . CENTRAL VENOUS CATHETER INSERTION Right 11/09/2013   Procedure: INSERTION CENTRAL LINE ADULT;  Surgeon: Evans Lance, MD;  Location: [place];  Service: Cardiovascular;  Laterality: Right;  .  Exploratory laparotomy with lysis of adhesions    . EYE SURGERY  BILATERAL CATARACTS REMOVAL  10 YEARS AGO PER PATIENT  . GAS INSERTION Right 11/10/2012   Procedure: INSERTION OF GAS;  Surgeon: Hayden Pedro, MD;  Location: [place];  Service: Ophthalmology;  Laterality: Right;  C3F8  . GAS/FLUID EXCHANGE Right 05/09/2013   Procedure: GAS/FLUID EXCHANGE;  Surgeon: Hayden Pedro, MD;  Location: [place];  Service: Ophthalmology;  Laterality: Right;  C3F8   . ICD LEAD REMOVAL N/A 11/09/2013   Procedure: ICD LEAD REMOVAL;  Surgeon: Evans Lance, MD;  Location: [place];  Service: Cardiovascular;  Laterality: N/A;  . LASER PHOTO ABLATION Right 05/09/2013   Procedure: LASER PHOTO ABLATION;  Surgeon: Hayden Pedro, MD;  Location: [place];  Service: Ophthalmology;  Laterality: Right;  Endolaser   . Left shoulder surgery     Rotator Cuff  . MEMBRANE PEEL Right 05/09/2013   Procedure: MEMBRANE PEEL;  Surgeon: Hayden Pedro, MD;  Location: [place];  Service: Ophthalmology;  Laterality: Right;  . PR VEIN BYPASS GRAFT,AORTO-FEM-POP  05-2011   Left popliteal- PTA graft   . SERUM PATCH Right 11/10/2012   Procedure: SERUM PATCH;  Surgeon: Hayden Pedro, MD;  Location: [place];  Service: Ophthalmology;  Laterality: Right;  . SERUM PATCH Right 05/09/2013   Procedure: SERUM PATCH;  Surgeon: Hayden Pedro, MD;  Location: [place];  Service: Ophthalmology;  Laterality: Right;  . STUMP REVISION Left 05/30/2014   Procedure: Left Below Knee Amputation Revision;  Surgeon: Newt Minion, MD;  Location: [place];  Service: Orthopedics;  Laterality: Left;  . TOE AMPUTATION Left    great toe  . Viterectomy  11/10/2012   OD   Dr Zigmund Daniel     Current Meds  Medication Sig  . ALPRAZolam (XANAX) 0.5 MG tablet Take one tablet by mouth twice daily as needed for anxiety  . amLODipine (NORVASC) 10 MG tablet TAKE 1 TABLET BY MOUTH EVERY DAY  . aspirin 81 MG tablet Take 81 mg by mouth daily.   . calcitRIOL (ROCALTROL) 0.25 MCG capsule Take  0.25 mcg by mouth daily.   . carvedilol (COREG) 25 MG tablet TAKE 1 TABLET BY MOUTH TWICE A DAY  . Cholecalciferol (VITAMIN D-3) 1000 UNITS CAPS Take 1,000 Units by mouth every other day.   . cloNIDine (CATAPRES) 0.1 MG tablet Take 1 tablet (0.1 mg total) by mouth 3 (three) times daily.  Marland Kitchen docusate sodium (COLACE) 100 MG capsule Take 100 mg by mouth daily as needed (constipation).   . hydrALAZINE (APRESOLINE) 100 MG tablet TAKE 1 TABLET BY MOUTH THREE TIMES A DAY  . Insulin Glargine (BASAGLAR KWIKPEN) 100 UNIT/ML SOPN Inject 30 Units into the skin at bedtime.  . insulin lispro (HUMALOG) 100 UNIT/ML injection Inject 0-8 Units into the skin every evening. Sliding scale: 200-249=2 units, 250-299=4 units, 300-349=6 units, >350=8 units  . isosorbide mononitrate (IMDUR) 30 MG 24 hr tablet TAKE 1 TABLET BY MOUTH  TWICE A DAY  . loratadine (CLARITIN) 10 MG tablet Take 10 mg by mouth daily as needed.  . nitroGLYCERIN (NITROSTAT) 0.4 MG SL tablet Place 1 tablet (0.4 mg total) under the tongue every 5 (five) minutes as needed for chest pain.  . polyethylene glycol (MIRALAX / GLYCOLAX) packet Take 17 g by mouth daily as needed (constipation).   . rosuvastatin (CRESTOR) 10 MG tablet Take 1 tablet by mouth daily.  . sodium bicarbonate 650 MG tablet Take 1,300 mg by mouth 2 (two) times daily.   Marland Kitchen torsemide (DEMADEX) 20 MG tablet Take 40 mg by mouth 2 (two) times daily.  . [DISCONTINUED] cloNIDine (CATAPRES) 0.1 MG tablet Take 0.1 mg by mouth 3 (three) times daily.  . [DISCONTINUED] torsemide (DEMADEX) 20 MG tablet Take 20 mg by mouth 2 (two) times daily. May take an extra tablet as needed for leg swelling     Allergies:   Hydrocodone-acetaminophen   ROS:   No palpitations or syncope.  Prior CV studies:   The following studies were reviewed today:  Echocardiogram 06/21/2019: 1. The left ventricle has a visually estimated ejection fraction of 50%.  The cavity size was normal. There is moderate concentric  left ventricular  hypertrophy. Left ventricular diastolic Doppler parameters are consistent  with impaired relaxation.  Elevated left ventricular end-diastolic pressure Left ventricular diffuse  hypokinesis.  2. The aortic valve is tricuspid. Mild thickening of the aortic valve.  Mild calcification of the aortic valve. Mild-moderate stenosis of the  aortic valve. Mild aortic annular calcification noted.  3. The mitral valve is degenerative. Mild thickening of the mitral valve  leaflet. There is mild mitral annular calcification present.  4. The tricuspid valve is grossly normal.  5. The aorta is normal unless otherwise noted.   Labs/Other Tests and Data Reviewed:    EKG:  An ECG dated 04/04/2018 was personally reviewed today and demonstrated:  Sinus bradycardia with rightward axis, IVCD, repolarization abnormalities.  Recent Labs:  January 2020: BUN 48, creatinine 2.77  Wt Readings from Last 3 Encounters:  12/08/19 180 lb (81.6 kg)  06/13/19 192 lb (87.1 kg)  12/05/18 188 lb 9.6 oz (85.5 kg)     Objective:    Vital Signs:  BP (!) 158/65   Pulse (!) 59   Ht 5\' 1"  (1.549 m)   Wt 180 lb (81.6 kg)   BMI 34.01 kg/m    Patient spoke in full sentences, not short of breath. No audible wheezing or coughing. Speech pattern normal.  ASSESSMENT & PLAN:    1.  Nonischemic cardiomyopathy, LVEF 50% by most recent echocardiogram on medical therapy.  She has been stable in terms of chronic combined heart failure, weight is down at least 10 pounds, Demadex was increased to 40 mg twice daily by nephrologist.  We will obtain her last lab work for review.  Continue Coreg, Norvasc, hydralazine, and Imdur.  2.  CKD stage IV, continue to follow with nephrology.  Requesting recent lab work.  She is not a candidate for ACE inhibitor, ARB, ANRI, or Aldactone at this time.   3.  Essential hypertension on multimodal therapy.  Continue current regimen as listed above, also includes  clonidine   Time:   Today, I have spent 5 minutes with the patient with telehealth technology discussing the above problems.     Medication Adjustments/Labs and Tests Ordered: Current medicines are reviewed at length with the patient today.  Concerns regarding medicines are outlined above.   Tests Ordered: No  orders of the defined types were placed in this encounter.   Medication Changes: Meds ordered this encounter  Medications  . cloNIDine (CATAPRES) 0.1 MG tablet    Sig: Take 1 tablet (0.1 mg total) by mouth 3 (three) times daily.    Dispense:  270 tablet    Refill:  3    Follow Up:  In Person 6 months in the [place] office.  Signed, Sharon Lesches, MD  12/08/2019 8:15 AM    [place]

## 2019-12-08 ENCOUNTER — Telehealth (INDEPENDENT_AMBULATORY_CARE_PROVIDER_SITE_OTHER): Payer: Medicare HMO | Admitting: Cardiology

## 2019-12-08 ENCOUNTER — Encounter: Payer: Self-pay | Admitting: Cardiology

## 2019-12-08 VITALS — BP 158/65 | HR 59 | Ht 61.0 in | Wt 180.0 lb

## 2019-12-08 DIAGNOSIS — I428 Other cardiomyopathies: Secondary | ICD-10-CM

## 2019-12-08 DIAGNOSIS — I10 Essential (primary) hypertension: Secondary | ICD-10-CM

## 2019-12-08 DIAGNOSIS — N184 Chronic kidney disease, stage 4 (severe): Secondary | ICD-10-CM

## 2019-12-08 MED ORDER — CLONIDINE HCL 0.1 MG PO TABS: 0.1000 mg | ORAL_TABLET | Freq: Three times a day (TID) | ORAL | 3 refills | Status: DC

## 2019-12-08 NOTE — Patient Instructions (Signed)
Medication Instructions:  Your physician recommends that you continue on your current medications as directed. Please refer to the Current Medication list given to you today.  I refilled your Clonidine.  *If you need a refill on your cardiac medications before your next appointment, please call your pharmacy*   Lab Work: None today  If you have labs (blood work) drawn today and your tests are completely normal, you will receive your results only by: Marland Kitchen MyChart Message (if you have MyChart) OR . A paper copy in the mail If you have any lab test that is abnormal or we need to change your treatment, we will call you to review the results.   Testing/Procedures: None today   Follow-Up: At [facility], you and your health needs are our priority.  As part of our continuing mission to provide you with exceptional heart care, we have created designated Provider Care Teams.  These Care Teams include your primary Cardiologist (physician) and Advanced Practice Providers (APPs -  Physician Assistants and Nurse Practitioners) who all work together to provide you with the care you need, when you need it.  We recommend signing up for the patient portal called "MyChart".  Sign up information is provided on this After Visit Summary.  MyChart is used to connect with patients for Virtual Visits (Telemedicine).  Patients are able to view lab/test results, encounter notes, upcoming appointments, etc.  Non-urgent messages can be sent to your provider as well.   To learn more about what you can do with MyChart, go to [email or web address].    Your next appointment:   6 month(s)  The format for your next appointment:   In Person  Provider:   Rozann Lesches, MD   Other Instructions None       Thank you for choosing [place] !

## 2019-12-17 DIAGNOSIS — R69 Illness, unspecified: Secondary | ICD-10-CM | POA: Diagnosis not present

## 2019-12-17 DIAGNOSIS — Z5181 Encounter for therapeutic drug level monitoring: Secondary | ICD-10-CM | POA: Diagnosis not present

## 2019-12-18 DIAGNOSIS — N170 Acute kidney failure with tubular necrosis: Secondary | ICD-10-CM | POA: Diagnosis not present

## 2019-12-18 DIAGNOSIS — N185 Chronic kidney disease, stage 5: Secondary | ICD-10-CM | POA: Diagnosis not present

## 2019-12-18 DIAGNOSIS — D631 Anemia in chronic kidney disease: Secondary | ICD-10-CM | POA: Diagnosis not present

## 2019-12-18 DIAGNOSIS — R809 Proteinuria, unspecified: Secondary | ICD-10-CM | POA: Diagnosis not present

## 2019-12-18 DIAGNOSIS — Z79899 Other long term (current) drug therapy: Secondary | ICD-10-CM | POA: Diagnosis not present

## 2019-12-18 DIAGNOSIS — E785 Hyperlipidemia, unspecified: Secondary | ICD-10-CM | POA: Diagnosis not present

## 2019-12-18 DIAGNOSIS — I129 Hypertensive chronic kidney disease with stage 1 through stage 4 chronic kidney disease, or unspecified chronic kidney disease: Secondary | ICD-10-CM | POA: Diagnosis not present

## 2019-12-18 DIAGNOSIS — N189 Chronic kidney disease, unspecified: Secondary | ICD-10-CM | POA: Diagnosis not present

## 2019-12-18 DIAGNOSIS — E211 Secondary hyperparathyroidism, not elsewhere classified: Secondary | ICD-10-CM | POA: Diagnosis not present

## 2019-12-18 DIAGNOSIS — E1165 Type 2 diabetes mellitus with hyperglycemia: Secondary | ICD-10-CM | POA: Diagnosis not present

## 2019-12-18 DIAGNOSIS — E1122 Type 2 diabetes mellitus with diabetic chronic kidney disease: Secondary | ICD-10-CM | POA: Diagnosis not present

## 2019-12-18 DIAGNOSIS — E1129 Type 2 diabetes mellitus with other diabetic kidney complication: Secondary | ICD-10-CM | POA: Diagnosis not present

## 2019-12-18 DIAGNOSIS — I10 Essential (primary) hypertension: Secondary | ICD-10-CM | POA: Diagnosis not present

## 2019-12-25 DIAGNOSIS — E6609 Other obesity due to excess calories: Secondary | ICD-10-CM | POA: Diagnosis not present

## 2019-12-25 DIAGNOSIS — N184 Chronic kidney disease, stage 4 (severe): Secondary | ICD-10-CM | POA: Diagnosis not present

## 2019-12-25 DIAGNOSIS — I129 Hypertensive chronic kidney disease with stage 1 through stage 4 chronic kidney disease, or unspecified chronic kidney disease: Secondary | ICD-10-CM | POA: Diagnosis not present

## 2019-12-25 DIAGNOSIS — Z79899 Other long term (current) drug therapy: Secondary | ICD-10-CM | POA: Diagnosis not present

## 2019-12-25 DIAGNOSIS — Z9714 Presence of artificial left leg (complete) (partial): Secondary | ICD-10-CM | POA: Diagnosis not present

## 2019-12-25 DIAGNOSIS — Z6834 Body mass index (BMI) 34.0-34.9, adult: Secondary | ICD-10-CM | POA: Diagnosis not present

## 2019-12-25 DIAGNOSIS — E119 Type 2 diabetes mellitus without complications: Secondary | ICD-10-CM | POA: Diagnosis not present

## 2019-12-25 DIAGNOSIS — E785 Hyperlipidemia, unspecified: Secondary | ICD-10-CM | POA: Diagnosis not present

## 2019-12-25 DIAGNOSIS — I509 Heart failure, unspecified: Secondary | ICD-10-CM | POA: Diagnosis not present

## 2019-12-25 DIAGNOSIS — Z0001 Encounter for general adult medical examination with abnormal findings: Secondary | ICD-10-CM | POA: Diagnosis not present

## 2019-12-25 DIAGNOSIS — R69 Illness, unspecified: Secondary | ICD-10-CM | POA: Diagnosis not present

## 2019-12-27 DIAGNOSIS — E1129 Type 2 diabetes mellitus with other diabetic kidney complication: Secondary | ICD-10-CM | POA: Diagnosis not present

## 2019-12-27 DIAGNOSIS — E211 Secondary hyperparathyroidism, not elsewhere classified: Secondary | ICD-10-CM | POA: Diagnosis not present

## 2019-12-27 DIAGNOSIS — N170 Acute kidney failure with tubular necrosis: Secondary | ICD-10-CM | POA: Diagnosis not present

## 2019-12-27 DIAGNOSIS — I129 Hypertensive chronic kidney disease with stage 1 through stage 4 chronic kidney disease, or unspecified chronic kidney disease: Secondary | ICD-10-CM | POA: Diagnosis not present

## 2019-12-27 DIAGNOSIS — D631 Anemia in chronic kidney disease: Secondary | ICD-10-CM | POA: Diagnosis not present

## 2019-12-27 DIAGNOSIS — E872 Acidosis: Secondary | ICD-10-CM | POA: Diagnosis not present

## 2019-12-27 DIAGNOSIS — E1122 Type 2 diabetes mellitus with diabetic chronic kidney disease: Secondary | ICD-10-CM | POA: Diagnosis not present

## 2019-12-27 DIAGNOSIS — R809 Proteinuria, unspecified: Secondary | ICD-10-CM | POA: Diagnosis not present

## 2019-12-27 DIAGNOSIS — N189 Chronic kidney disease, unspecified: Secondary | ICD-10-CM | POA: Diagnosis not present

## 2019-12-29 DIAGNOSIS — R69 Illness, unspecified: Secondary | ICD-10-CM | POA: Diagnosis not present

## 2020-01-14 ENCOUNTER — Other Ambulatory Visit: Payer: Self-pay | Admitting: Cardiology

## 2020-01-22 DIAGNOSIS — R69 Illness, unspecified: Secondary | ICD-10-CM | POA: Diagnosis not present

## 2020-02-06 DIAGNOSIS — R69 Illness, unspecified: Secondary | ICD-10-CM | POA: Diagnosis not present

## 2020-02-13 DIAGNOSIS — E261 Secondary hyperaldosteronism: Secondary | ICD-10-CM | POA: Diagnosis not present

## 2020-02-13 DIAGNOSIS — I132 Hypertensive heart and chronic kidney disease with heart failure and with stage 5 chronic kidney disease, or end stage renal disease: Secondary | ICD-10-CM | POA: Diagnosis not present

## 2020-02-13 DIAGNOSIS — N186 End stage renal disease: Secondary | ICD-10-CM | POA: Diagnosis not present

## 2020-02-13 DIAGNOSIS — I509 Heart failure, unspecified: Secondary | ICD-10-CM | POA: Diagnosis not present

## 2020-02-13 DIAGNOSIS — I429 Cardiomyopathy, unspecified: Secondary | ICD-10-CM | POA: Diagnosis not present

## 2020-02-13 DIAGNOSIS — Z794 Long term (current) use of insulin: Secondary | ICD-10-CM | POA: Diagnosis not present

## 2020-02-13 DIAGNOSIS — Z89512 Acquired absence of left leg below knee: Secondary | ICD-10-CM | POA: Diagnosis not present

## 2020-02-13 DIAGNOSIS — I25119 Atherosclerotic heart disease of native coronary artery with unspecified angina pectoris: Secondary | ICD-10-CM | POA: Diagnosis not present

## 2020-02-13 DIAGNOSIS — E1122 Type 2 diabetes mellitus with diabetic chronic kidney disease: Secondary | ICD-10-CM | POA: Diagnosis not present

## 2020-02-14 DIAGNOSIS — E1122 Type 2 diabetes mellitus with diabetic chronic kidney disease: Secondary | ICD-10-CM | POA: Diagnosis not present

## 2020-02-14 DIAGNOSIS — E1129 Type 2 diabetes mellitus with other diabetic kidney complication: Secondary | ICD-10-CM | POA: Diagnosis not present

## 2020-02-14 DIAGNOSIS — E211 Secondary hyperparathyroidism, not elsewhere classified: Secondary | ICD-10-CM | POA: Diagnosis not present

## 2020-02-14 DIAGNOSIS — N170 Acute kidney failure with tubular necrosis: Secondary | ICD-10-CM | POA: Diagnosis not present

## 2020-02-14 DIAGNOSIS — D631 Anemia in chronic kidney disease: Secondary | ICD-10-CM | POA: Diagnosis not present

## 2020-02-14 DIAGNOSIS — N189 Chronic kidney disease, unspecified: Secondary | ICD-10-CM | POA: Diagnosis not present

## 2020-02-14 DIAGNOSIS — I129 Hypertensive chronic kidney disease with stage 1 through stage 4 chronic kidney disease, or unspecified chronic kidney disease: Secondary | ICD-10-CM | POA: Diagnosis not present

## 2020-02-14 DIAGNOSIS — R809 Proteinuria, unspecified: Secondary | ICD-10-CM | POA: Diagnosis not present

## 2020-02-21 DIAGNOSIS — N189 Chronic kidney disease, unspecified: Secondary | ICD-10-CM | POA: Diagnosis not present

## 2020-02-21 DIAGNOSIS — R809 Proteinuria, unspecified: Secondary | ICD-10-CM | POA: Diagnosis not present

## 2020-02-21 DIAGNOSIS — E872 Acidosis: Secondary | ICD-10-CM | POA: Diagnosis not present

## 2020-02-21 DIAGNOSIS — I129 Hypertensive chronic kidney disease with stage 1 through stage 4 chronic kidney disease, or unspecified chronic kidney disease: Secondary | ICD-10-CM | POA: Diagnosis not present

## 2020-02-21 DIAGNOSIS — E211 Secondary hyperparathyroidism, not elsewhere classified: Secondary | ICD-10-CM | POA: Diagnosis not present

## 2020-02-21 DIAGNOSIS — E1122 Type 2 diabetes mellitus with diabetic chronic kidney disease: Secondary | ICD-10-CM | POA: Diagnosis not present

## 2020-02-21 DIAGNOSIS — D631 Anemia in chronic kidney disease: Secondary | ICD-10-CM | POA: Diagnosis not present

## 2020-02-21 DIAGNOSIS — E1129 Type 2 diabetes mellitus with other diabetic kidney complication: Secondary | ICD-10-CM | POA: Diagnosis not present

## 2020-02-25 IMAGING — US US RENAL
1 series · 14 of 25 positions shown · non-contrast
Comparison: None

CLINICAL DATA: Acute renal failure with tubular necrosis

EXAM:
RENAL / URINARY TRACT ULTRASOUND COMPLETE

[Series 1: us renal · 14 of 55 slices shown]
[im 1/55]
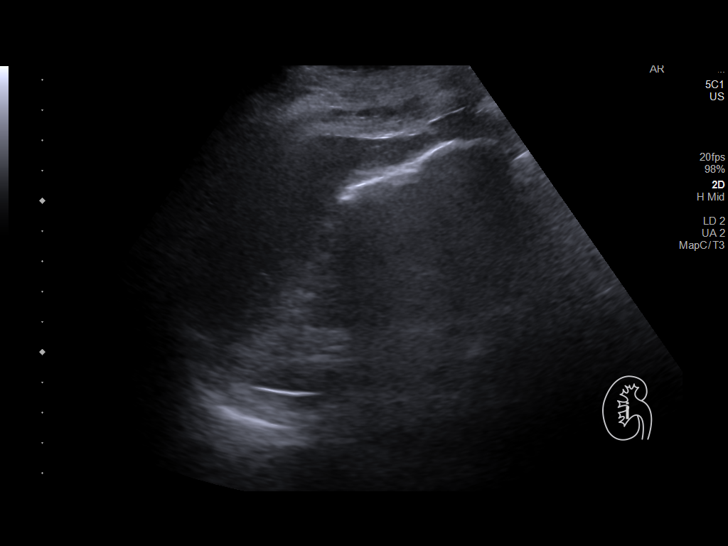
[im 5/55]
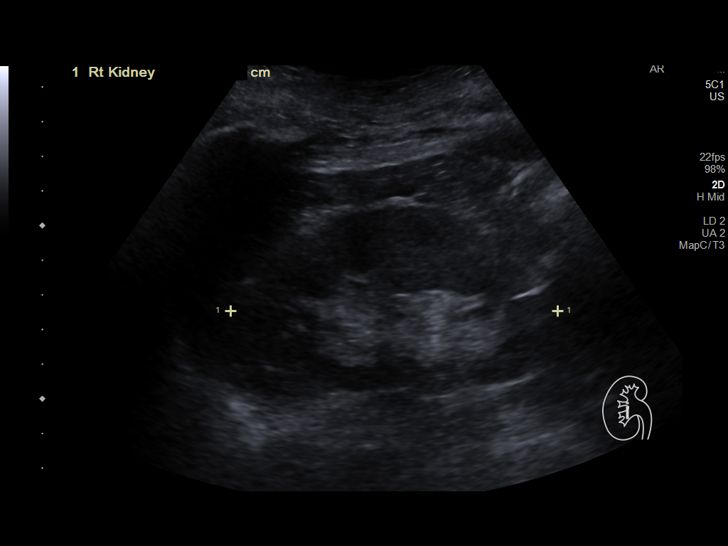
[im 10/55]
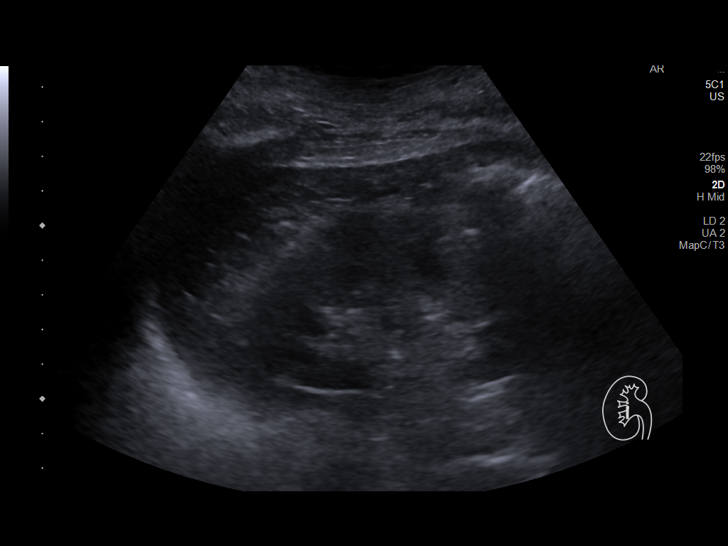
[im 14/55]
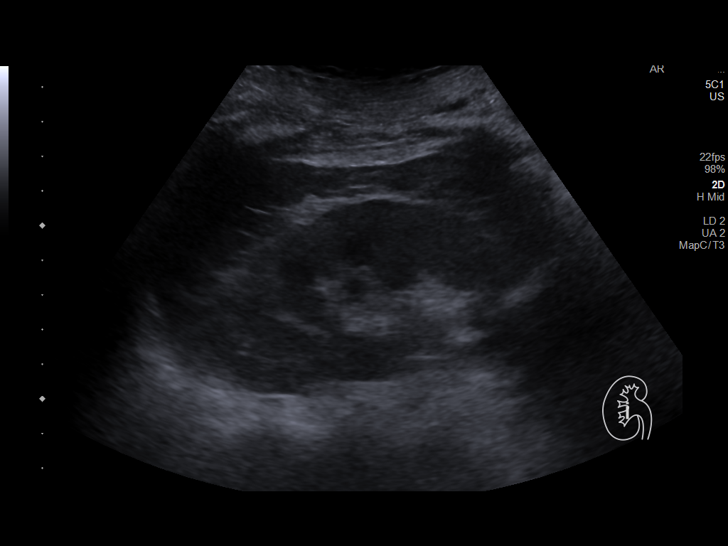
[im 19/55]
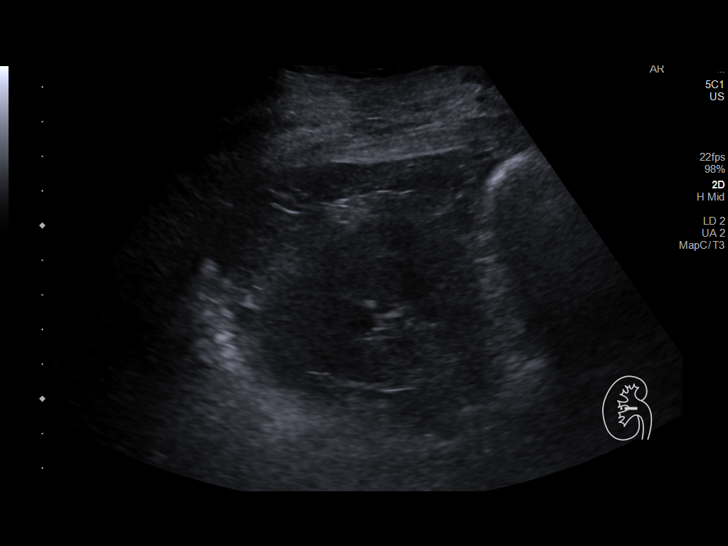
[im 21/55]
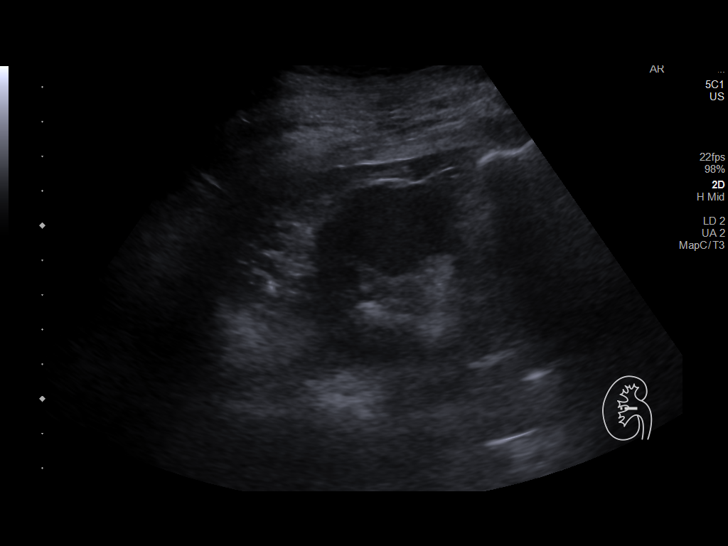
[im 25/55]
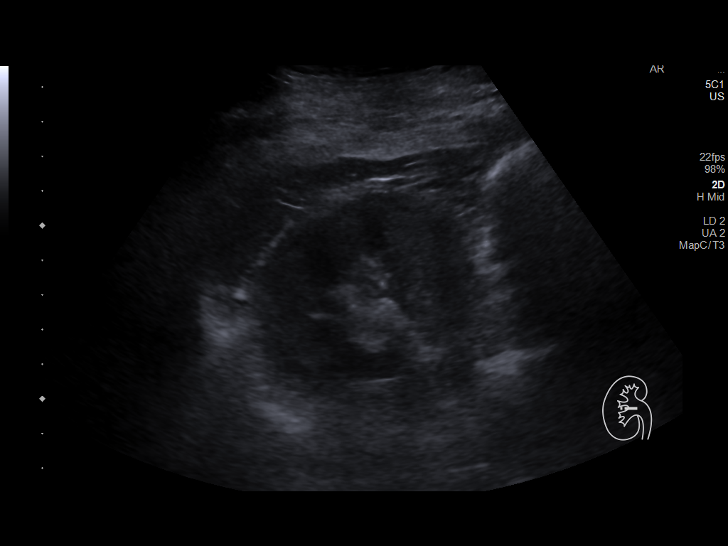
[im 30/55]
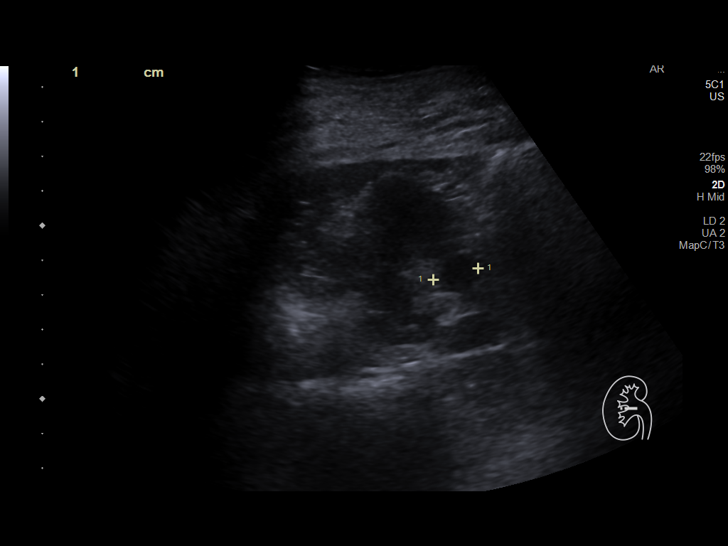
[im 34/55]
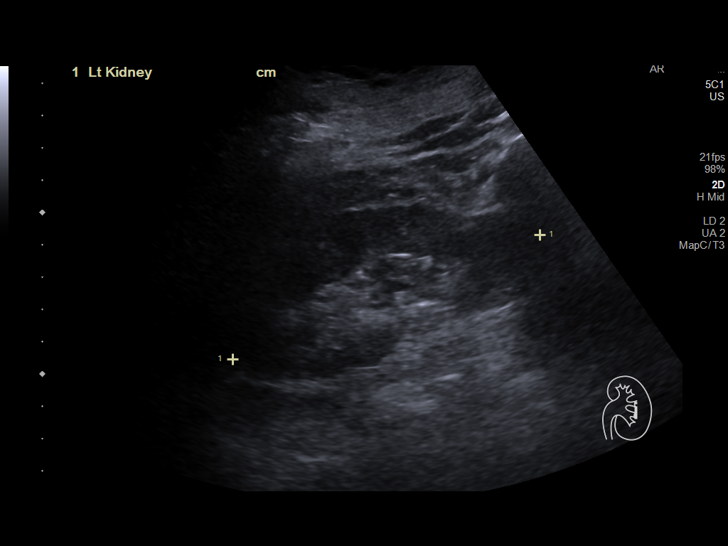
[im 37/55]
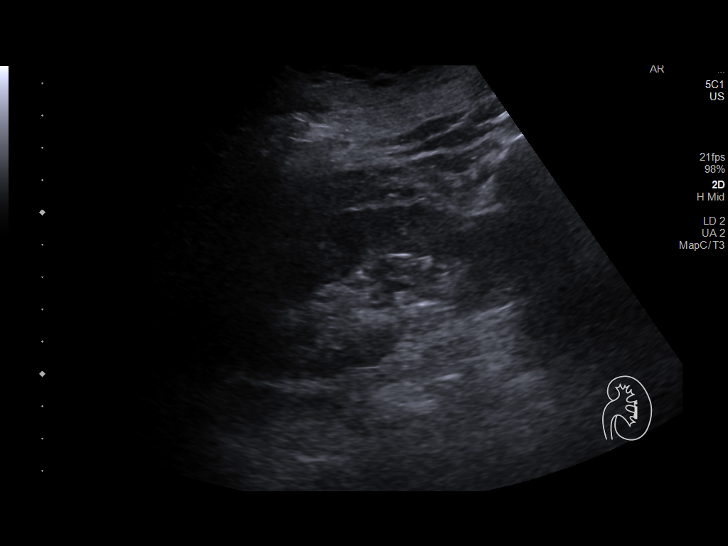
[im 41/55]
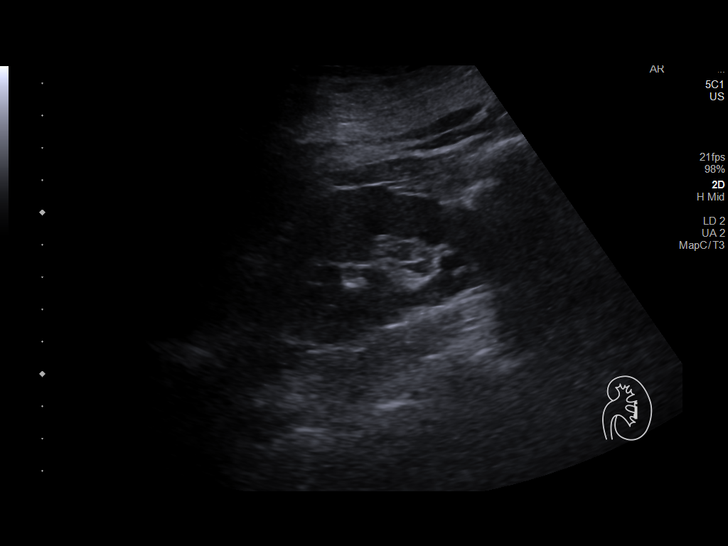
[im 46/55]
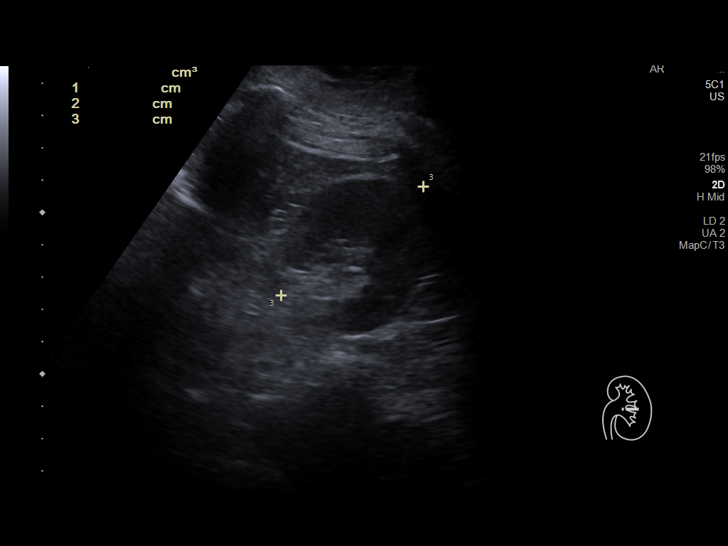
[im 50/55]
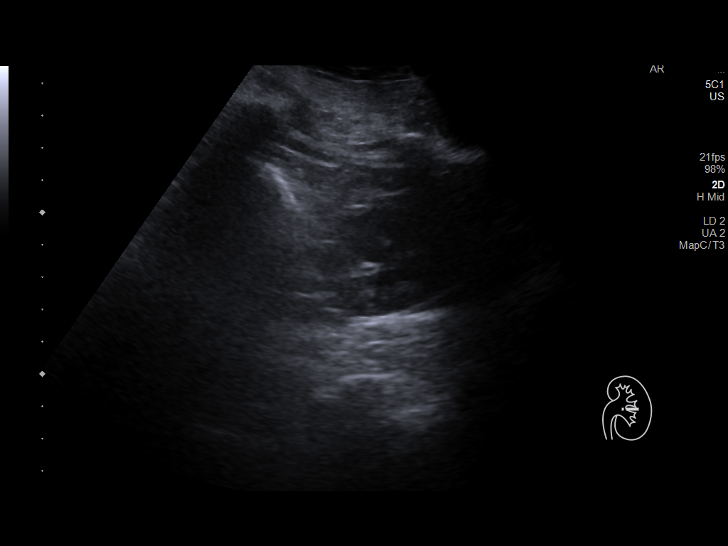
[im 55/55]
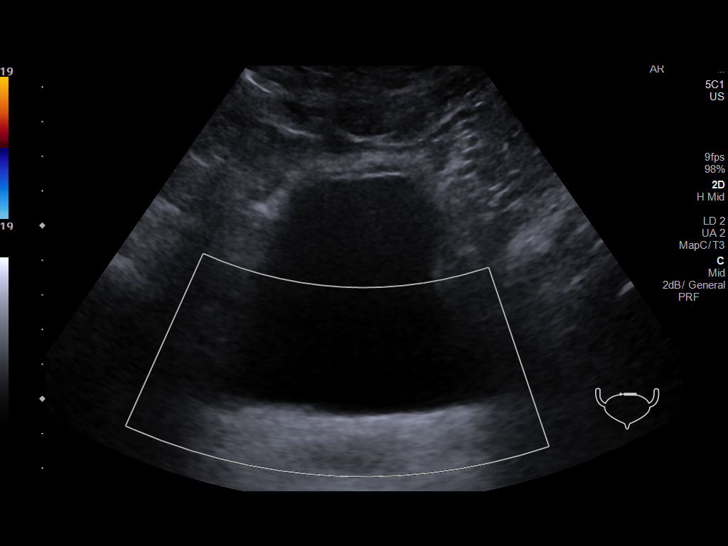

[14 of 25 positions shown; findings below may reference images not displayed]

FINDINGS: Right Kidney:

Renal measurements: 10.5 x 5.8 x 5.9 cm = volume: 186.6 mL.
Echogenicity within normal limits. Small cyst at the lower pole. No
hydronephrosis visualized.

Left Kidney:

Renal measurements: 10.6 x 4.9 x 5.5 cm = volume: 151.9 mL.
Echogenicity within normal limits. No mass or hydronephrosis
visualized.

Bladder:

Appears normal for degree of bladder distention.

Other:

None.
IMPRESSION: No hydronephrosis.

## 2020-03-18 ENCOUNTER — Other Ambulatory Visit: Payer: Self-pay

## 2020-03-18 ENCOUNTER — Encounter (INDEPENDENT_AMBULATORY_CARE_PROVIDER_SITE_OTHER): Payer: Medicare HMO | Admitting: Ophthalmology

## 2020-03-18 DIAGNOSIS — I10 Essential (primary) hypertension: Secondary | ICD-10-CM | POA: Diagnosis not present

## 2020-03-18 DIAGNOSIS — E11319 Type 2 diabetes mellitus with unspecified diabetic retinopathy without macular edema: Secondary | ICD-10-CM | POA: Diagnosis not present

## 2020-03-18 DIAGNOSIS — H35341 Macular cyst, hole, or pseudohole, right eye: Secondary | ICD-10-CM | POA: Diagnosis not present

## 2020-03-18 DIAGNOSIS — R69 Illness, unspecified: Secondary | ICD-10-CM | POA: Diagnosis not present

## 2020-03-18 DIAGNOSIS — E113593 Type 2 diabetes mellitus with proliferative diabetic retinopathy without macular edema, bilateral: Secondary | ICD-10-CM

## 2020-03-18 DIAGNOSIS — H43813 Vitreous degeneration, bilateral: Secondary | ICD-10-CM | POA: Diagnosis not present

## 2020-03-18 DIAGNOSIS — H35033 Hypertensive retinopathy, bilateral: Secondary | ICD-10-CM

## 2020-03-19 DIAGNOSIS — R252 Cramp and spasm: Secondary | ICD-10-CM | POA: Diagnosis not present

## 2020-03-19 DIAGNOSIS — I129 Hypertensive chronic kidney disease with stage 1 through stage 4 chronic kidney disease, or unspecified chronic kidney disease: Secondary | ICD-10-CM | POA: Diagnosis not present

## 2020-03-19 DIAGNOSIS — M818 Other osteoporosis without current pathological fracture: Secondary | ICD-10-CM | POA: Diagnosis not present

## 2020-03-19 DIAGNOSIS — Q899 Congenital malformation, unspecified: Secondary | ICD-10-CM | POA: Diagnosis not present

## 2020-03-19 DIAGNOSIS — N184 Chronic kidney disease, stage 4 (severe): Secondary | ICD-10-CM | POA: Diagnosis not present

## 2020-03-19 DIAGNOSIS — Z9714 Presence of artificial left leg (complete) (partial): Secondary | ICD-10-CM | POA: Diagnosis not present

## 2020-03-19 DIAGNOSIS — R5382 Chronic fatigue, unspecified: Secondary | ICD-10-CM | POA: Diagnosis not present

## 2020-03-19 DIAGNOSIS — Z89512 Acquired absence of left leg below knee: Secondary | ICD-10-CM | POA: Diagnosis not present

## 2020-03-25 DIAGNOSIS — E785 Hyperlipidemia, unspecified: Secondary | ICD-10-CM | POA: Diagnosis not present

## 2020-03-25 DIAGNOSIS — I10 Essential (primary) hypertension: Secondary | ICD-10-CM | POA: Diagnosis not present

## 2020-03-25 DIAGNOSIS — E119 Type 2 diabetes mellitus without complications: Secondary | ICD-10-CM | POA: Diagnosis not present

## 2020-03-30 DIAGNOSIS — R69 Illness, unspecified: Secondary | ICD-10-CM | POA: Diagnosis not present

## 2020-04-02 DIAGNOSIS — E1165 Type 2 diabetes mellitus with hyperglycemia: Secondary | ICD-10-CM | POA: Diagnosis not present

## 2020-04-02 DIAGNOSIS — E785 Hyperlipidemia, unspecified: Secondary | ICD-10-CM | POA: Diagnosis not present

## 2020-04-02 DIAGNOSIS — R2681 Unsteadiness on feet: Secondary | ICD-10-CM | POA: Diagnosis not present

## 2020-04-02 DIAGNOSIS — Z794 Long term (current) use of insulin: Secondary | ICD-10-CM | POA: Diagnosis not present

## 2020-04-02 DIAGNOSIS — I10 Essential (primary) hypertension: Secondary | ICD-10-CM | POA: Diagnosis not present

## 2020-04-02 DIAGNOSIS — N184 Chronic kidney disease, stage 4 (severe): Secondary | ICD-10-CM | POA: Diagnosis not present

## 2020-04-02 DIAGNOSIS — E7849 Other hyperlipidemia: Secondary | ICD-10-CM | POA: Diagnosis not present

## 2020-04-02 DIAGNOSIS — R69 Illness, unspecified: Secondary | ICD-10-CM | POA: Diagnosis not present

## 2020-04-02 DIAGNOSIS — Z79899 Other long term (current) drug therapy: Secondary | ICD-10-CM | POA: Diagnosis not present

## 2020-04-17 DIAGNOSIS — E1122 Type 2 diabetes mellitus with diabetic chronic kidney disease: Secondary | ICD-10-CM | POA: Diagnosis not present

## 2020-04-17 DIAGNOSIS — E872 Acidosis: Secondary | ICD-10-CM | POA: Diagnosis not present

## 2020-04-17 DIAGNOSIS — I129 Hypertensive chronic kidney disease with stage 1 through stage 4 chronic kidney disease, or unspecified chronic kidney disease: Secondary | ICD-10-CM | POA: Diagnosis not present

## 2020-04-17 DIAGNOSIS — D631 Anemia in chronic kidney disease: Secondary | ICD-10-CM | POA: Diagnosis not present

## 2020-04-17 DIAGNOSIS — R809 Proteinuria, unspecified: Secondary | ICD-10-CM | POA: Diagnosis not present

## 2020-04-17 DIAGNOSIS — E211 Secondary hyperparathyroidism, not elsewhere classified: Secondary | ICD-10-CM | POA: Diagnosis not present

## 2020-04-17 DIAGNOSIS — E1129 Type 2 diabetes mellitus with other diabetic kidney complication: Secondary | ICD-10-CM | POA: Diagnosis not present

## 2020-04-17 DIAGNOSIS — N189 Chronic kidney disease, unspecified: Secondary | ICD-10-CM | POA: Diagnosis not present

## 2020-04-24 DIAGNOSIS — R69 Illness, unspecified: Secondary | ICD-10-CM | POA: Diagnosis not present

## 2020-04-26 DIAGNOSIS — R809 Proteinuria, unspecified: Secondary | ICD-10-CM | POA: Diagnosis not present

## 2020-04-26 DIAGNOSIS — E211 Secondary hyperparathyroidism, not elsewhere classified: Secondary | ICD-10-CM | POA: Diagnosis not present

## 2020-04-26 DIAGNOSIS — E1129 Type 2 diabetes mellitus with other diabetic kidney complication: Secondary | ICD-10-CM | POA: Diagnosis not present

## 2020-04-26 DIAGNOSIS — E1122 Type 2 diabetes mellitus with diabetic chronic kidney disease: Secondary | ICD-10-CM | POA: Diagnosis not present

## 2020-04-26 DIAGNOSIS — I129 Hypertensive chronic kidney disease with stage 1 through stage 4 chronic kidney disease, or unspecified chronic kidney disease: Secondary | ICD-10-CM | POA: Diagnosis not present

## 2020-04-26 DIAGNOSIS — N189 Chronic kidney disease, unspecified: Secondary | ICD-10-CM | POA: Diagnosis not present

## 2020-04-30 ENCOUNTER — Other Ambulatory Visit: Payer: Self-pay | Admitting: Cardiology

## 2020-05-29 ENCOUNTER — Other Ambulatory Visit: Payer: Self-pay | Admitting: Cardiology

## 2020-06-01 DIAGNOSIS — R69 Illness, unspecified: Secondary | ICD-10-CM | POA: Diagnosis not present

## 2020-06-13 DIAGNOSIS — R69 Illness, unspecified: Secondary | ICD-10-CM | POA: Diagnosis not present

## 2020-06-13 DIAGNOSIS — Z79891 Long term (current) use of opiate analgesic: Secondary | ICD-10-CM | POA: Diagnosis not present

## 2020-06-21 DIAGNOSIS — Z79899 Other long term (current) drug therapy: Secondary | ICD-10-CM | POA: Diagnosis not present

## 2020-06-21 DIAGNOSIS — E1122 Type 2 diabetes mellitus with diabetic chronic kidney disease: Secondary | ICD-10-CM | POA: Diagnosis not present

## 2020-06-21 DIAGNOSIS — R69 Illness, unspecified: Secondary | ICD-10-CM | POA: Diagnosis not present

## 2020-06-21 DIAGNOSIS — N189 Chronic kidney disease, unspecified: Secondary | ICD-10-CM | POA: Diagnosis not present

## 2020-06-21 DIAGNOSIS — R809 Proteinuria, unspecified: Secondary | ICD-10-CM | POA: Diagnosis not present

## 2020-06-21 DIAGNOSIS — E211 Secondary hyperparathyroidism, not elsewhere classified: Secondary | ICD-10-CM | POA: Diagnosis not present

## 2020-06-21 DIAGNOSIS — E7849 Other hyperlipidemia: Secondary | ICD-10-CM | POA: Diagnosis not present

## 2020-06-21 DIAGNOSIS — E1165 Type 2 diabetes mellitus with hyperglycemia: Secondary | ICD-10-CM | POA: Diagnosis not present

## 2020-06-21 DIAGNOSIS — Z794 Long term (current) use of insulin: Secondary | ICD-10-CM | POA: Diagnosis not present

## 2020-06-21 DIAGNOSIS — N184 Chronic kidney disease, stage 4 (severe): Secondary | ICD-10-CM | POA: Diagnosis not present

## 2020-06-21 DIAGNOSIS — I129 Hypertensive chronic kidney disease with stage 1 through stage 4 chronic kidney disease, or unspecified chronic kidney disease: Secondary | ICD-10-CM | POA: Diagnosis not present

## 2020-06-21 DIAGNOSIS — E1129 Type 2 diabetes mellitus with other diabetic kidney complication: Secondary | ICD-10-CM | POA: Diagnosis not present

## 2020-06-28 ENCOUNTER — Encounter: Payer: Self-pay | Admitting: Cardiology

## 2020-06-28 ENCOUNTER — Other Ambulatory Visit: Payer: Self-pay

## 2020-06-28 ENCOUNTER — Ambulatory Visit: Payer: Medicare HMO | Admitting: Cardiology

## 2020-06-28 VITALS — BP 133/62 | HR 63 | Ht 61.0 in | Wt 177.4 lb

## 2020-06-28 DIAGNOSIS — I428 Other cardiomyopathies: Secondary | ICD-10-CM

## 2020-06-28 DIAGNOSIS — I350 Nonrheumatic aortic (valve) stenosis: Secondary | ICD-10-CM

## 2020-06-28 DIAGNOSIS — N184 Chronic kidney disease, stage 4 (severe): Secondary | ICD-10-CM | POA: Diagnosis not present

## 2020-06-28 NOTE — Patient Instructions (Signed)
Medication Instructions:  Your physician recommends that you continue on your current medications as directed. Please refer to the Current Medication list given to you today.  *If you need a refill on your cardiac medications before your next appointment, please call your pharmacy*   Lab Work: None today If you have labs (blood work) drawn today and your tests are completely normal, you will receive your results only by: . MyChart Message (if you have MyChart) OR . A paper copy in the mail If you have any lab test that is abnormal or we need to change your treatment, we will call you to review the results.   Testing/Procedures: Your physician has requested that you have an echocardiogram. Echocardiography is a painless test that uses sound waves to create images of your heart. It provides your doctor with information about the size and shape of your heart and how well your heart's chambers and valves are working. This procedure takes approximately one hour. There are no restrictions for this procedure.     Follow-Up: At [facility], you and your health needs are our priority.  As part of our continuing mission to provide you with exceptional heart care, we have created designated Provider Care Teams.  These Care Teams include your primary Cardiologist (physician) and Advanced Practice Providers (APPs -  Physician Assistants and Nurse Practitioners) who all work together to provide you with the care you need, when you need it.  We recommend signing up for the patient portal called "MyChart".  Sign up information is provided on this After Visit Summary.  MyChart is used to connect with patients for Virtual Visits (Telemedicine).  Patients are able to view lab/test results, encounter notes, upcoming appointments, etc.  Non-urgent messages can be sent to your provider as well.   To learn more about what you can do with MyChart, go to [email or web address].    Your next appointment:   6  month(s)  The format for your next appointment:   In Person  Provider:   Samuel McDowell, MD   Other Instructions None       Thank you for choosing [facility] !         

## 2020-06-28 NOTE — Progress Notes (Signed)
Cardiology Office Note  Date: 06/28/2020   ID: [name], DOB 04/30/1946, MRN [id number]  PCP:  Jani Gravel, MD  Cardiologist:  Rozann Lesches, MD Electrophysiologist:  None   Chief Complaint  Patient presents with  . Cardiac follow-up    History of Present Illness: [name] is a 74 y.o. female last assessed via telehealth encounter in March.  She presents for a routine visit.  She states that she has been more fatigued over the last several months, also having trouble with early satiety and a bloating sensation.  She has been using a stool softener.  She continues to follow with Dr. Theador Hawthorne, I reviewed the note from July.  Most recent creatinine was 2.57.  I reviewed her medications which are outlined below.  Blood pressure is reasonably well controlled today.  Her weight is down 3 pounds from March.  Current dose of Demadex is actually 40 mg twice daily.  I personally reviewed her ECG today which shows sinus rhythm with left bundle branch block.  Past Medical History:  Diagnosis Date  . Anemia   . Anxiety   . Chronic systolic heart failure ([name])   . Coronary atherosclerosis of native coronary artery    Nonobstructive  . Depression   . Diabetes mellitus, type 2 ([name])   . Essential hypertension, benign   . MRSA bacteremia January 2015   Associated with probable tricuspid valve vegetation, ICD system extracted February 2015  . Nonischemic cardiomyopathy ([name])    LVEF 20% up to 45%  . PAD (peripheral artery disease) ([name])    Left foot gangrene    Past Surgical History:  Procedure Laterality Date  . [place] VITRECTOMY WITH 20 GAUGE MVR PORT FOR MACULAR HOLE Right 11/10/2012   Procedure: 25 GAUGE PARS PLANA VITRECTOMY WITH 20 GAUGE MVR PORT FOR MACULAR HOLE;  Surgeon: Hayden Pedro, MD;  Location: [place];  Service: Ophthalmology;  Laterality: Right;  . 25 GAUGE PARS PLANA VITRECTOMY WITH 20 GAUGE MVR PORT FOR MACULAR HOLE Right 05/09/2013    Procedure: 25 GAUGE PARS PLANA VITRECTOMY WITH 20 GAUGE MVR PORT FOR MACULAR HOLE;  Surgeon: Hayden Pedro, MD;  Location: [place];  Service: Ophthalmology;  Laterality: Right;  . ABDOMINAL HYSTERECTOMY    . AMPUTATION Left 03/23/2014   Procedure: Left Foot 1st Ray Amputation;  Surgeon: Newt Minion, MD;  Location: [place];  Service: Orthopedics;  Laterality: Left;  Left Foot 1st Ray Amputation  . AMPUTATION Left 04/13/2014   Procedure: AMPUTATION BELOW KNEE;  Surgeon: Newt Minion, MD;  Location: [place];  Service: Orthopedics;  Laterality: Left;  Left Below Knee Amputation  . BLADDER SURGERY  2011 BENIGN MASS REMOVED FROM BLADDER  . BREAST LUMPECTOMY     Bil  . CARDIAC DEFIBRILLATOR PLACEMENT     Medtronic [id number]   . CENTRAL VENOUS CATHETER INSERTION Right 11/09/2013   Procedure: INSERTION CENTRAL LINE ADULT;  Surgeon: Evans Lance, MD;  Location: [place];  Service: Cardiovascular;  Laterality: Right;  . Exploratory laparotomy with lysis of adhesions    . EYE SURGERY  BILATERAL CATARACTS REMOVAL  10 YEARS AGO PER PATIENT  . GAS INSERTION Right 11/10/2012   Procedure: INSERTION OF GAS;  Surgeon: Hayden Pedro, MD;  Location: [place];  Service: Ophthalmology;  Laterality: Right;  C3F8  . GAS/FLUID EXCHANGE Right 05/09/2013   Procedure: GAS/FLUID EXCHANGE;  Surgeon: Hayden Pedro, MD;  Location: [place];  Service: Ophthalmology;  Laterality: Right;  C3F8   . ICD LEAD REMOVAL N/A 11/09/2013   Procedure: ICD LEAD REMOVAL;  Surgeon: Evans Lance, MD;  Location: [place];  Service: Cardiovascular;  Laterality: N/A;  . LASER PHOTO ABLATION Right 05/09/2013   Procedure: LASER PHOTO ABLATION;  Surgeon: Hayden Pedro, MD;  Location: [place];  Service: Ophthalmology;  Laterality: Right;  Endolaser   . Left shoulder surgery     Rotator Cuff  . MEMBRANE PEEL Right 05/09/2013   Procedure: MEMBRANE PEEL;  Surgeon: Hayden Pedro, MD;  Location: [place];  Service: Ophthalmology;  Laterality: Right;  . PR VEIN BYPASS  GRAFT,AORTO-FEM-POP  05-2011   Left popliteal- PTA graft   . SERUM PATCH Right 11/10/2012   Procedure: SERUM PATCH;  Surgeon: Hayden Pedro, MD;  Location: [place];  Service: Ophthalmology;  Laterality: Right;  . SERUM PATCH Right 05/09/2013   Procedure: SERUM PATCH;  Surgeon: Hayden Pedro, MD;  Location: [place];  Service: Ophthalmology;  Laterality: Right;  . STUMP REVISION Left 05/30/2014   Procedure: Left Below Knee Amputation Revision;  Surgeon: Newt Minion, MD;  Location: [place];  Service: Orthopedics;  Laterality: Left;  . TOE AMPUTATION Left    great toe  . Viterectomy  11/10/2012   OD   Dr Zigmund Daniel    Current Outpatient Medications  Medication Sig Dispense Refill  . ALPRAZolam (XANAX) 0.5 MG tablet Take one tablet by mouth twice daily as needed for anxiety    . amLODipine (NORVASC) 10 MG tablet TAKE 1 TABLET BY MOUTH EVERY DAY 90 tablet 3  . aspirin 81 MG tablet Take 81 mg by mouth daily.     . calcitRIOL (ROCALTROL) 0.25 MCG capsule Take 0.25 mcg by mouth daily.     . carvedilol (COREG) 25 MG tablet TAKE 1 TABLET BY MOUTH TWICE A DAY 180 tablet 3  . Cholecalciferol (VITAMIN D-3) 1000 UNITS CAPS Take 1,000 Units by mouth every other day.     . cloNIDine (CATAPRES) 0.1 MG tablet Take 1 tablet (0.1 mg total) by mouth 3 (three) times daily. 270 tablet 3  . hydrALAZINE (APRESOLINE) 100 MG tablet TAKE 1 TABLET BY MOUTH THREE TIMES A DAY 270 tablet 2  . Insulin Glargine (BASAGLAR KWIKPEN) 100 UNIT/ML SOPN Inject 30 Units into the skin at bedtime.    . insulin lispro (HUMALOG) 100 UNIT/ML injection Inject 0-8 Units into the skin every evening. Sliding scale: 200-249=2 units, 250-299=4 units, 300-349=6 units, >350=8 units    . isosorbide mononitrate (IMDUR) 30 MG 24 hr tablet TAKE 1 TABLET BY MOUTH TWICE A DAY 180 tablet 2  . loratadine (CLARITIN) 10 MG tablet Take 10 mg by mouth daily as needed.  11  . polyethylene glycol (MIRALAX / GLYCOLAX) packet Take 17 g by mouth daily as needed  (constipation).     . rosuvastatin (CRESTOR) 10 MG tablet Take 1 tablet by mouth daily.    . sodium bicarbonate 650 MG tablet Take 1,300 mg by mouth 2 (two) times daily.     Marland Kitchen torsemide (DEMADEX) 20 MG tablet TAKE 3 TABLETS BY MOUTH TWICE A DAY 540 tablet 2  . nitroGLYCERIN (NITROSTAT) 0.4 MG SL tablet Place 1 tablet (0.4 mg total) under the tongue every 5 (five) minutes as needed for chest pain. (Patient not taking: Reported on 06/28/2020) 25 tablet 3   No current facility-administered medications for this visit.   Allergies:  Hydrocodone-acetaminophen   ROS:  Uses a walker, no recent falls.  Physical Exam: VS:  BP 133/62   Pulse 63   Ht 5\' 1"  (1.549 m)   Wt 177 lb 6.4 oz (80.5 kg)   SpO2 93%   BMI 33.52 kg/m , BMI Body mass index is 33.52 kg/m.  Wt Readings from Last 3 Encounters:  06/28/20 177 lb 6.4 oz (80.5 kg)  12/08/19 180 lb (81.6 kg)  06/13/19 192 lb (87.1 kg)    General: Patient appears comfortable at rest. HEENT: Conjunctiva and lids normal, wearing a mask. Neck: Supple, no elevated JVP or carotid bruits, no thyromegaly. Lungs: Clear to auscultation, nonlabored breathing at rest. Cardiac: Regular rate and rhythm with paradoxically split S2, no S3, 2/6 systolic murmur, no pericardial rub. Abdomen: Nontender, bowel sounds present. Extremities: Left leg prosthesis status post BKA.  ECG:  An ECG dated 04/04/2018 was personally reviewed today and demonstrated:  Sinus bradycardia with rightward axis, IVCD, repolarization abnormalities.  Recent Labwork:  July 2021: Creatinine 2.57, BUN 48, potassium 4.0, hemoglobin 11.5, platelets 193  Other Studies Reviewed Today:  Echocardiogram 06/21/2019: 1. The left ventricle has a visually estimated ejection fraction of 50%.  The cavity size was normal. There is moderate concentric left ventricular  hypertrophy. Left ventricular diastolic Doppler parameters are consistent  with impaired relaxation.  Elevated left ventricular  end-diastolic pressure Left ventricular diffuse  hypokinesis.  2. The aortic valve is tricuspid. Mild thickening of the aortic valve.  Mild calcification of the aortic valve. Mild-moderate stenosis of the  aortic valve. Mild aortic annular calcification noted.  3. The mitral valve is degenerative. Mild thickening of the mitral valve  leaflet. There is mild mitral annular calcification present.  4. The tricuspid valve is grossly normal.  5. The aorta is normal unless otherwise noted.   Assessment and Plan:  1.  History of nonischemic cardiomyopathy, LVEF 50% by assessment in September 2020.  Plan to obtain a follow-up echocardiogram..  Continue Coreg, hydralazine, Imdur, and Norvasc.  She continues on Demadex 40 mg twice daily for now.  2.  CKD stage IV, her most recent creatinine 2.57 with normal potassium.  She continues to follow with Dr. Theador Hawthorne.  3.  Essential hypertension, blood pressure is adequately controlled today.  4.  Early satiety and abdominal bloating as well as constipation.  She is trying a stool softener.  She does have a previous history of bowel obstruction.  I asked her to speak with her PCP if the symptoms persist in case she needs GI consultation.  Medication Adjustments/Labs and Tests Ordered: Current medicines are reviewed at length with the patient today.  Concerns regarding medicines are outlined above.   Tests Ordered: Orders Placed This Encounter  Procedures  . EKG 12-Lead    Medication Changes: No orders of the defined types were placed in this encounter.   Disposition:  Follow up 6 months in [place] office.  Signed, Satira Sark, MD, [facility] 06/28/2020 11:53 AM    [place] [facility] [place]. [address], [place], [place] [address] Phone: [phone]; Fax: [phone]

## 2020-07-01 DIAGNOSIS — K5900 Constipation, unspecified: Secondary | ICD-10-CM | POA: Diagnosis not present

## 2020-07-01 DIAGNOSIS — Z794 Long term (current) use of insulin: Secondary | ICD-10-CM | POA: Diagnosis not present

## 2020-07-01 DIAGNOSIS — Z79899 Other long term (current) drug therapy: Secondary | ICD-10-CM | POA: Diagnosis not present

## 2020-07-01 DIAGNOSIS — I129 Hypertensive chronic kidney disease with stage 1 through stage 4 chronic kidney disease, or unspecified chronic kidney disease: Secondary | ICD-10-CM | POA: Diagnosis not present

## 2020-07-01 DIAGNOSIS — R630 Anorexia: Secondary | ICD-10-CM | POA: Diagnosis not present

## 2020-07-01 DIAGNOSIS — E119 Type 2 diabetes mellitus without complications: Secondary | ICD-10-CM | POA: Diagnosis not present

## 2020-07-01 DIAGNOSIS — E1122 Type 2 diabetes mellitus with diabetic chronic kidney disease: Secondary | ICD-10-CM | POA: Diagnosis not present

## 2020-07-01 DIAGNOSIS — E785 Hyperlipidemia, unspecified: Secondary | ICD-10-CM | POA: Diagnosis not present

## 2020-07-01 DIAGNOSIS — N184 Chronic kidney disease, stage 4 (severe): Secondary | ICD-10-CM | POA: Diagnosis not present

## 2020-07-01 DIAGNOSIS — I509 Heart failure, unspecified: Secondary | ICD-10-CM | POA: Diagnosis not present

## 2020-07-01 NOTE — Addendum Note (Signed)
Addended by: Laurine Blazer on: 07/01/2020 01:57 PM   Modules accepted: Orders

## 2020-07-03 ENCOUNTER — Ambulatory Visit (HOSPITAL_COMMUNITY)
Admission: RE | Admit: 2020-07-03 | Discharge: 2020-07-03 | Disposition: A | Discharge status: Home / Self Care | Payer: Medicare HMO | Source: Ambulatory Visit | Attending: Internal Medicine | Admitting: Internal Medicine

## 2020-07-03 ENCOUNTER — Other Ambulatory Visit: Payer: Self-pay

## 2020-07-03 DIAGNOSIS — R69 Illness, unspecified: Secondary | ICD-10-CM | POA: Diagnosis not present

## 2020-07-03 DIAGNOSIS — I428 Other cardiomyopathies: Secondary | ICD-10-CM | POA: Diagnosis not present

## 2020-07-03 LAB — ECHOCARDIOGRAM COMPLETE
Area-P 1/2: 3.7 cm2
Calc EF: 40.1 %
S' Lateral: 3.91 cm
Single Plane A2C EF: 41.4 %
Single Plane A4C EF: 41.9 %

## 2020-07-03 NOTE — Progress Notes (Signed)
*  PRELIMINARY RESULTS* Echocardiogram 2D Echocardiogram has been performed.  Samuel Germany 07/03/2020, 9:33 AM

## 2020-07-04 ENCOUNTER — Telehealth: Payer: Self-pay

## 2020-07-04 DIAGNOSIS — N189 Chronic kidney disease, unspecified: Secondary | ICD-10-CM | POA: Diagnosis not present

## 2020-07-04 DIAGNOSIS — E211 Secondary hyperparathyroidism, not elsewhere classified: Secondary | ICD-10-CM | POA: Diagnosis not present

## 2020-07-04 DIAGNOSIS — R809 Proteinuria, unspecified: Secondary | ICD-10-CM | POA: Diagnosis not present

## 2020-07-04 DIAGNOSIS — I5043 Acute on chronic combined systolic (congestive) and diastolic (congestive) heart failure: Secondary | ICD-10-CM | POA: Diagnosis not present

## 2020-07-04 DIAGNOSIS — E1129 Type 2 diabetes mellitus with other diabetic kidney complication: Secondary | ICD-10-CM | POA: Diagnosis not present

## 2020-07-04 DIAGNOSIS — D631 Anemia in chronic kidney disease: Secondary | ICD-10-CM | POA: Diagnosis not present

## 2020-07-04 DIAGNOSIS — N170 Acute kidney failure with tubular necrosis: Secondary | ICD-10-CM | POA: Diagnosis not present

## 2020-07-04 DIAGNOSIS — E1122 Type 2 diabetes mellitus with diabetic chronic kidney disease: Secondary | ICD-10-CM | POA: Diagnosis not present

## 2020-07-04 NOTE — Telephone Encounter (Signed)
I spoke with patient.She wants to wait 2 weeks and call me back regarding heart failure clinic.

## 2020-07-04 NOTE — Telephone Encounter (Signed)
-----   Message from Satira Sark, MD sent at 07/03/2020 11:34 AM EDT ----- Results reviewed.  Follow-up echocardiogram shows LVEF 35 to 40% range, decreased compared to last assessment.  Also moderately elevated right ventricular pressures with normal contraction.  Blood pressure and medications were reasonable at recent office visit and we do have limitations in medical therapy related to her renal insufficiency.  If she is willing, we could have her evaluated in the advanced heart failure clinic for concurrent follow-up and evaluation, she would continue to see me as scheduled.

## 2020-07-08 DIAGNOSIS — R69 Illness, unspecified: Secondary | ICD-10-CM | POA: Diagnosis not present

## 2020-07-10 ENCOUNTER — Telehealth: Payer: Self-pay | Admitting: *Deleted

## 2020-07-10 ENCOUNTER — Telehealth: Payer: Self-pay | Admitting: Cardiology

## 2020-07-10 ENCOUNTER — Ambulatory Visit (INDEPENDENT_AMBULATORY_CARE_PROVIDER_SITE_OTHER): Payer: Medicare HMO | Admitting: *Deleted

## 2020-07-10 ENCOUNTER — Other Ambulatory Visit: Payer: Self-pay

## 2020-07-10 DIAGNOSIS — I10 Essential (primary) hypertension: Secondary | ICD-10-CM

## 2020-07-10 DIAGNOSIS — R001 Bradycardia, unspecified: Secondary | ICD-10-CM

## 2020-07-10 DIAGNOSIS — I5022 Chronic systolic (congestive) heart failure: Secondary | ICD-10-CM

## 2020-07-10 MED ORDER — CARVEDILOL 12.5 MG PO TABS
12.5000 mg | ORAL_TABLET | Freq: Two times a day (BID) | ORAL | 11 refills | Status: DC
Start: 2020-07-10 — End: 2020-09-16

## 2020-07-10 NOTE — Telephone Encounter (Signed)
Pt notified and orders placed  

## 2020-07-10 NOTE — Telephone Encounter (Signed)
Pt notified and she states that she will have to call the office back around 3:30 to schedule an appt. For ECG.

## 2020-07-10 NOTE — Telephone Encounter (Signed)
I am glad that she agreed for a heart failure clinic consultation.  I see that there was initial concern about slow heart rate in the telephone message.  Please have her stop by at the [facility] office for an ECG to confirm that she is in sinus rhythm, may need to have Coreg dose cut back.  I mainly want to make sure that she is not in heart block.

## 2020-07-10 NOTE — Telephone Encounter (Signed)
Pt calling to request that referral to heart failure clinic be placed as offered in echo results. Pt understands that she will continue to see Dr. [name].

## 2020-07-10 NOTE — Telephone Encounter (Signed)
-----   Message from Satira Sark, MD sent at 07/10/2020  4:43 PM EDT ----- Results reviewed.  ECG from today shows sinus rhythm at 62 bpm with old left bundle branch block.  I am not certain about the reported heart rates in the 30s to 40s reported in the initial telephone conversation.  For now I would suggest that we cut her Coreg back to 12.5 mg twice daily and provide a 72-hour heart monitor for further investigation.

## 2020-07-10 NOTE — Telephone Encounter (Signed)
New message    Patient called she is concerned that her heart rate is lower than it should be, she would like to speak with Dr [name], she said her heart rate is staying between 35-40

## 2020-07-10 NOTE — Progress Notes (Signed)
Pt in office for EKG

## 2020-07-12 DIAGNOSIS — N189 Chronic kidney disease, unspecified: Secondary | ICD-10-CM | POA: Diagnosis not present

## 2020-07-12 DIAGNOSIS — N170 Acute kidney failure with tubular necrosis: Secondary | ICD-10-CM | POA: Diagnosis not present

## 2020-07-12 DIAGNOSIS — D519 Vitamin B12 deficiency anemia, unspecified: Secondary | ICD-10-CM | POA: Diagnosis not present

## 2020-07-12 DIAGNOSIS — E1129 Type 2 diabetes mellitus with other diabetic kidney complication: Secondary | ICD-10-CM | POA: Diagnosis not present

## 2020-07-12 DIAGNOSIS — E211 Secondary hyperparathyroidism, not elsewhere classified: Secondary | ICD-10-CM | POA: Diagnosis not present

## 2020-07-12 DIAGNOSIS — E1122 Type 2 diabetes mellitus with diabetic chronic kidney disease: Secondary | ICD-10-CM | POA: Diagnosis not present

## 2020-07-12 DIAGNOSIS — R809 Proteinuria, unspecified: Secondary | ICD-10-CM | POA: Diagnosis not present

## 2020-07-12 DIAGNOSIS — D631 Anemia in chronic kidney disease: Secondary | ICD-10-CM | POA: Diagnosis not present

## 2020-07-12 DIAGNOSIS — D509 Iron deficiency anemia, unspecified: Secondary | ICD-10-CM | POA: Diagnosis not present

## 2020-07-18 DIAGNOSIS — R809 Proteinuria, unspecified: Secondary | ICD-10-CM | POA: Diagnosis not present

## 2020-07-18 DIAGNOSIS — E211 Secondary hyperparathyroidism, not elsewhere classified: Secondary | ICD-10-CM | POA: Diagnosis not present

## 2020-07-18 DIAGNOSIS — N170 Acute kidney failure with tubular necrosis: Secondary | ICD-10-CM | POA: Diagnosis not present

## 2020-07-18 DIAGNOSIS — E1129 Type 2 diabetes mellitus with other diabetic kidney complication: Secondary | ICD-10-CM | POA: Diagnosis not present

## 2020-07-18 DIAGNOSIS — N189 Chronic kidney disease, unspecified: Secondary | ICD-10-CM | POA: Diagnosis not present

## 2020-07-18 DIAGNOSIS — E1122 Type 2 diabetes mellitus with diabetic chronic kidney disease: Secondary | ICD-10-CM | POA: Diagnosis not present

## 2020-07-18 DIAGNOSIS — D631 Anemia in chronic kidney disease: Secondary | ICD-10-CM | POA: Diagnosis not present

## 2020-07-19 ENCOUNTER — Ambulatory Visit (INDEPENDENT_AMBULATORY_CARE_PROVIDER_SITE_OTHER): Payer: Medicare HMO

## 2020-07-19 DIAGNOSIS — E1122 Type 2 diabetes mellitus with diabetic chronic kidney disease: Secondary | ICD-10-CM | POA: Diagnosis not present

## 2020-07-19 DIAGNOSIS — R001 Bradycardia, unspecified: Secondary | ICD-10-CM | POA: Diagnosis not present

## 2020-07-19 DIAGNOSIS — R809 Proteinuria, unspecified: Secondary | ICD-10-CM | POA: Diagnosis not present

## 2020-07-19 DIAGNOSIS — E1129 Type 2 diabetes mellitus with other diabetic kidney complication: Secondary | ICD-10-CM | POA: Diagnosis not present

## 2020-07-19 DIAGNOSIS — I5042 Chronic combined systolic (congestive) and diastolic (congestive) heart failure: Secondary | ICD-10-CM | POA: Diagnosis not present

## 2020-07-19 DIAGNOSIS — D631 Anemia in chronic kidney disease: Secondary | ICD-10-CM | POA: Diagnosis not present

## 2020-07-19 DIAGNOSIS — E211 Secondary hyperparathyroidism, not elsewhere classified: Secondary | ICD-10-CM | POA: Diagnosis not present

## 2020-07-19 DIAGNOSIS — N189 Chronic kidney disease, unspecified: Secondary | ICD-10-CM | POA: Diagnosis not present

## 2020-07-23 ENCOUNTER — Encounter: Payer: Self-pay | Admitting: Internal Medicine

## 2020-07-30 ENCOUNTER — Telehealth: Payer: Self-pay | Admitting: Cardiology

## 2020-07-30 NOTE — Telephone Encounter (Signed)
New message    Patient called wants to know if you got monitor results back? She is also having issues with SOB and fatigue.  Pt c/o Shortness Of Breath: STAT if SOB developed within the last 24 hours or pt is noticeably SOB on the phone  1. Are you currently SOB (can you hear that pt is SOB on the phone)? no  2. How long have you been experiencing SOB? Comes and goes   3. Are you SOB when sitting or when up moving around? Both   4. Are you currently experiencing any other symptoms? Not this morning, yesterday she said she struggled with breathing all day, when she breathes through her sweater it was easier for her breath. She said she counted her breathes and they were between 33 and 36. This morning she is not having any problems, she said this started right after she got her echo.

## 2020-07-30 NOTE — Telephone Encounter (Signed)
Patient feels she may have had a panic attack, no symptoms now.Zio monitor is in the process now. I will call her with results.

## 2020-08-01 ENCOUNTER — Other Ambulatory Visit: Payer: Self-pay

## 2020-08-01 DIAGNOSIS — R001 Bradycardia, unspecified: Secondary | ICD-10-CM | POA: Diagnosis not present

## 2020-08-08 ENCOUNTER — Ambulatory Visit: Payer: Medicare HMO | Admitting: Internal Medicine

## 2020-08-08 ENCOUNTER — Other Ambulatory Visit: Payer: Self-pay

## 2020-08-08 ENCOUNTER — Encounter: Payer: Self-pay | Admitting: Internal Medicine

## 2020-08-08 VITALS — BP 116/49 | HR 60 | Temp 97.1°F | Ht 61.0 in | Wt 171.6 lb

## 2020-08-08 DIAGNOSIS — K5909 Other constipation: Secondary | ICD-10-CM | POA: Diagnosis not present

## 2020-08-08 NOTE — Patient Instructions (Signed)
Continue on MiraLAX daily for your chronic constipation.  You can titrate this up to up to 3 capsules at a time if need be.  I would recommend adding over-the-counter Metamucil or Benefiber to your diet.  Sure to drink at least 4 to 6 glasses of water daily.  Follow-up with GI in 6 months or sooner if needed.  At [facility] we value your feedback. You may receive a survey about your visit today. Please share your experience as we strive to create trusting relationships with our patients to provide genuine, compassionate, quality care.  We appreciate your understanding and patience as we review any laboratory studies, imaging, and other diagnostic tests that are ordered as we care for you. Our office policy is 5 business days for review of these results, and any emergent or urgent results are addressed in a timely manner for your best interest. If you do not hear from our office in 1 week, please contact us.   We also encourage the use of MyChart, which contains your medical information for your review as well. If you are not enrolled in this feature, an access code is on this after visit summary for your convenience. Thank you for allowing [place] to be involved in your care.  It was great to see you today!  I hope you have a great rest of your fall!!    Dillon Livermore K. Abbey Chatters, D.O. Gastroenterology and Hepatology [facility]

## 2020-08-08 NOTE — Progress Notes (Signed)
Primary Care Physician:  Denyce Robert, FNP Primary Gastroenterologist:  Dr. Abbey Chatters  Chief Complaint  Patient presents with  . Constipation    wasn't eating in Aug,not having constipation anymore, was bloated    HPI:   [name] is a 74 y.o. female who presents to the clinic today by referral from her PCP [facility] for evaluation for chronic constipation.  Patient states this is a relatively new issue for her.  Notes approximately 1 month ago she had an episode of abdominal pain and bloating.  At that time she was going multiple days without having a bowel movement.  She states she started taking over-the-counter MiraLAX one capful daily and her symptoms have improved drastically.  No longer having pain or bloating.  States she has a normal bowel movement every 2 to 3 days.  No melena hematochezia.  No unintentional weight loss.  As far as colon cancer screening she states she had a negative Cologuard earlier this year.  No family history of colorectal malignancy.  Denies any chronic reflux.  No dysphagia odynophagia.  Otherwise she has no other complaints.  Past Medical History:  Diagnosis Date  . Anemia   . Anxiety   . Chronic systolic heart failure ([place])   . Coronary atherosclerosis of native coronary artery    Nonobstructive  . Depression   . Diabetes mellitus, type 2 ([place])   . Essential hypertension, benign   . MRSA bacteremia January 2015   Associated with probable tricuspid valve vegetation, ICD system extracted February 2015  . Nonischemic cardiomyopathy (HCC)    LVEF 20% up to 45%  . PAD (peripheral artery disease) (HCC)    Left foot gangrene    Past Surgical History:  Procedure Laterality Date  . [place] VITRECTOMY WITH 20 GAUGE MVR PORT FOR MACULAR HOLE Right 11/10/2012   Procedure: 25 GAUGE PARS PLANA VITRECTOMY WITH 20 GAUGE MVR PORT FOR MACULAR HOLE;  Surgeon: Hayden Pedro, MD;  Location: [place];  Service: Ophthalmology;  Laterality:  Right;  . 25 GAUGE PARS PLANA VITRECTOMY WITH 20 GAUGE MVR PORT FOR MACULAR HOLE Right 05/09/2013   Procedure: 25 GAUGE PARS PLANA VITRECTOMY WITH 20 GAUGE MVR PORT FOR MACULAR HOLE;  Surgeon: Hayden Pedro, MD;  Location: [place];  Service: Ophthalmology;  Laterality: Right;  . ABDOMINAL HYSTERECTOMY    . AMPUTATION Left 03/23/2014   Procedure: Left Foot 1st Ray Amputation;  Surgeon: Newt Minion, MD;  Location: [place];  Service: Orthopedics;  Laterality: Left;  Left Foot 1st Ray Amputation  . AMPUTATION Left 04/13/2014   Procedure: AMPUTATION BELOW KNEE;  Surgeon: Newt Minion, MD;  Location: [place];  Service: Orthopedics;  Laterality: Left;  Left Below Knee Amputation  . BLADDER SURGERY  2011 BENIGN MASS REMOVED FROM BLADDER  . BREAST LUMPECTOMY     Bil  . CARDIAC DEFIBRILLATOR PLACEMENT     Medtronic [id number]   . CENTRAL VENOUS CATHETER INSERTION Right 11/09/2013   Procedure: INSERTION CENTRAL LINE ADULT;  Surgeon: Evans Lance, MD;  Location: [place];  Service: Cardiovascular;  Laterality: Right;  . Exploratory laparotomy with lysis of adhesions    . EYE SURGERY  BILATERAL CATARACTS REMOVAL  10 YEARS AGO PER PATIENT  . GAS INSERTION Right 11/10/2012   Procedure: INSERTION OF GAS;  Surgeon: Hayden Pedro, MD;  Location: [place];  Service: Ophthalmology;  Laterality: Right;  C3F8  . GAS/FLUID EXCHANGE Right 05/09/2013   Procedure:  GAS/FLUID EXCHANGE;  Surgeon: Hayden Pedro, MD;  Location: [place];  Service: Ophthalmology;  Laterality: Right;  C3F8   . ICD LEAD REMOVAL N/A 11/09/2013   Procedure: ICD LEAD REMOVAL;  Surgeon: Evans Lance, MD;  Location: [place];  Service: Cardiovascular;  Laterality: N/A;  . LASER PHOTO ABLATION Right 05/09/2013   Procedure: LASER PHOTO ABLATION;  Surgeon: Hayden Pedro, MD;  Location: [place];  Service: Ophthalmology;  Laterality: Right;  Endolaser   . Left shoulder surgery     Rotator Cuff  . MEMBRANE PEEL Right 05/09/2013   Procedure: MEMBRANE PEEL;  Surgeon: Hayden Pedro, MD;  Location: [place];  Service: Ophthalmology;  Laterality: Right;  . PR VEIN BYPASS GRAFT,AORTO-FEM-POP  05-2011   Left popliteal- PTA graft   . SERUM PATCH Right 11/10/2012   Procedure: SERUM PATCH;  Surgeon: Hayden Pedro, MD;  Location: [place];  Service: Ophthalmology;  Laterality: Right;  . SERUM PATCH Right 05/09/2013   Procedure: SERUM PATCH;  Surgeon: Hayden Pedro, MD;  Location: [place];  Service: Ophthalmology;  Laterality: Right;  . STUMP REVISION Left 05/30/2014   Procedure: Left Below Knee Amputation Revision;  Surgeon: Newt Minion, MD;  Location: [place];  Service: Orthopedics;  Laterality: Left;  . TOE AMPUTATION Left    great toe  . Viterectomy  11/10/2012   OD   Dr Zigmund Daniel    Current Outpatient Medications  Medication Sig Dispense Refill  . ALPRAZolam (XANAX) 0.5 MG tablet Take one tablet by mouth twice daily as needed for anxiety    . amLODipine (NORVASC) 10 MG tablet TAKE 1 TABLET BY MOUTH EVERY DAY 90 tablet 3  . aspirin 81 MG tablet Take 81 mg by mouth daily.     . calcitRIOL (ROCALTROL) 0.25 MCG capsule Take 0.25 mcg by mouth every other day.     . carvedilol (COREG) 12.5 MG tablet Take 1 tablet (12.5 mg total) by mouth 2 (two) times daily with a meal. (Patient taking differently: Take 12.5 mg by mouth 2 (two) times daily with a meal. Takes 1/2 tablet in morning and 1/2 tablet in the afternoon.) 60 tablet 11  . Cholecalciferol (VITAMIN D-3) 1000 UNITS CAPS Take 1,000 Units by mouth every other day.     . cloNIDine (CATAPRES) 0.1 MG tablet Take 1 tablet (0.1 mg total) by mouth 3 (three) times daily. 270 tablet 3  . hydrALAZINE (APRESOLINE) 100 MG tablet TAKE 1 TABLET BY MOUTH THREE TIMES A DAY 270 tablet 2  . Insulin Glargine (BASAGLAR KWIKPEN) 100 UNIT/ML SOPN Inject 30 Units into the skin at bedtime.    . insulin lispro (HUMALOG) 100 UNIT/ML injection Inject 0-8 Units into the skin every evening. Sliding scale: 200-249=2 units, 250-299=4 units, 300-349=6  units, >350=8 units    . isosorbide mononitrate (IMDUR) 30 MG 24 hr tablet TAKE 1 TABLET BY MOUTH TWICE A DAY 180 tablet 2  . loratadine (CLARITIN) 10 MG tablet Take 10 mg by mouth daily as needed.  11  . polyethylene glycol (MIRALAX / GLYCOLAX) packet Take 17 g by mouth every other day.     . rosuvastatin (CRESTOR) 10 MG tablet Take 1 tablet by mouth daily.    . sodium bicarbonate 650 MG tablet Take 1,300 mg by mouth 2 (two) times daily.     Marland Kitchen torsemide (DEMADEX) 20 MG tablet TAKE 3 TABLETS BY MOUTH TWICE A DAY 540 tablet 2  . nitroGLYCERIN (NITROSTAT) 0.4 MG SL tablet Place  1 tablet (0.4 mg total) under the tongue every 5 (five) minutes as needed for chest pain. (Patient not taking: Reported on 06/28/2020) 25 tablet 3   No current facility-administered medications for this visit.    Allergies as of 08/08/2020 - Review Complete 08/08/2020  Allergen Reaction Noted  . Hydrocodone-acetaminophen Other (See Comments) 12/31/2009    Family History  Problem Relation Age of Onset  . Cirrhosis Father   . Cancer Mother        Ovarian  . Ovarian cancer Mother   . Hypertension Mother     Social History   Socioeconomic History  . Marital status: Divorced    Spouse name: Not on file  . Number of children: 2  . Years of education: Not on file  . Highest education level: Not on file  Occupational History  . Occupation: DISABLED    Employer: UNEMPLOYED  . Occupation: [employer]-full time    Comment: (out of work)  Tobacco Use  . Smoking status: Never Smoker  . Smokeless tobacco: Never Used  . Tobacco comment: tobacco use-no  Vaping Use  . Vaping Use: Never used  Substance and Sexual Activity  . Alcohol use: No    Alcohol/week: 0.0 standard drinks  . Drug use: No  . Sexual activity: Not on file  Other Topics Concern  . Not on file  Social History Narrative  . Not on file   Social Determinants of Health   Financial Resource Strain:   . Difficulty of Paying Living  Expenses: Not on file  Food Insecurity:   . Worried About Charity fundraiser in the Last Year: Not on file  . Ran Out of Food in the Last Year: Not on file  Transportation Needs:   . Lack of Transportation (Medical): Not on file  . Lack of Transportation (Non-Medical): Not on file  Physical Activity:   . Days of Exercise per Week: Not on file  . Minutes of Exercise per Session: Not on file  Stress:   . Feeling of Stress : Not on file  Social Connections:   . Frequency of Communication with Friends and Family: Not on file  . Frequency of Social Gatherings with Friends and Family: Not on file  . Attends Religious Services: Not on file  . Active Member of Clubs or Organizations: Not on file  . Attends Archivist Meetings: Not on file  . Marital Status: Not on file  Intimate Partner Violence:   . Fear of Current or Ex-Partner: Not on file  . Emotionally Abused: Not on file  . Physically Abused: Not on file  . Sexually Abused: Not on file    Subjective: Review of Systems  Constitutional: Negative for chills and fever.  HENT: Negative for congestion and hearing loss.   Eyes: Negative for blurred vision and double vision.  Respiratory: Negative for cough and shortness of breath.   Cardiovascular: Negative for chest pain and palpitations.  Gastrointestinal: Positive for constipation. Negative for abdominal pain, blood in stool, diarrhea, heartburn, melena and vomiting.  Genitourinary: Negative for dysuria and urgency.  Musculoskeletal: Negative for joint pain and myalgias.  Skin: Negative for itching and rash.  Neurological: Negative for dizziness and headaches.  Psychiatric/Behavioral: Negative for depression. The patient is not nervous/anxious.        Objective: BP (!) 116/49   Pulse 60   Temp (!) 97.1 F (36.2 C) (Temporal)   Ht 5\' 1"  (1.549 m)   Wt 171 lb 9.6  oz (77.8 kg)   BMI 32.42 kg/m  Physical Exam Constitutional:      Appearance: Normal appearance.    HENT:     Head: Normocephalic and atraumatic.  Eyes:     Extraocular Movements: Extraocular movements intact.     Conjunctiva/sclera: Conjunctivae normal.  Cardiovascular:     Rate and Rhythm: Normal rate and regular rhythm.  Pulmonary:     Effort: Pulmonary effort is normal.     Breath sounds: Normal breath sounds.  Abdominal:     General: Bowel sounds are normal.     Palpations: Abdomen is soft.  Musculoskeletal:        General: No swelling. Normal range of motion.     Cervical back: Normal range of motion and neck supple.  Skin:    General: Skin is warm and dry.     Coloration: Skin is not jaundiced.  Neurological:     General: No focal deficit present.     Mental Status: She is alert and oriented to person, place, and time.  Psychiatric:        Mood and Affect: Mood normal.        Behavior: Behavior normal.      Assessment: *Chronic constipation-new, currently controlled on MiraLAX daily  Plan: Discussed constipation in depth with patient today.  Her symptoms are vastly improved on MiraLAX one capful daily.  I did tell her that she could titrate this up as needed.  She can take up to 3 capsules at a time or one capful up to four times daily.  Recommended that she add over-the-counter Benefiber or Metamucil.  I counseled her to drink at least 4 to 6 glasses of water daily.  Colon cancer screening up-to-date with negative Cologuard earlier this year.  If patient has worsening symptoms of her constipation or evidence of bleeding such as melena hematochezia, we may consider endoscopic examination in the future.  Patient follow-up in 6 months or sooner if needed  08/08/2020 2:40 PM   Disclaimer: This note was dictated with voice recognition software. Similar sounding words can inadvertently be transcribed and may not be corrected upon review.

## 2020-08-15 DIAGNOSIS — R69 Illness, unspecified: Secondary | ICD-10-CM | POA: Diagnosis not present

## 2020-08-25 ENCOUNTER — Other Ambulatory Visit: Payer: Self-pay | Admitting: Cardiology

## 2020-09-09 DIAGNOSIS — E785 Hyperlipidemia, unspecified: Secondary | ICD-10-CM | POA: Diagnosis not present

## 2020-09-09 DIAGNOSIS — R809 Proteinuria, unspecified: Secondary | ICD-10-CM | POA: Diagnosis not present

## 2020-09-09 DIAGNOSIS — N189 Chronic kidney disease, unspecified: Secondary | ICD-10-CM | POA: Diagnosis not present

## 2020-09-09 DIAGNOSIS — E211 Secondary hyperparathyroidism, not elsewhere classified: Secondary | ICD-10-CM | POA: Diagnosis not present

## 2020-09-09 DIAGNOSIS — E119 Type 2 diabetes mellitus without complications: Secondary | ICD-10-CM | POA: Diagnosis not present

## 2020-09-09 DIAGNOSIS — E1129 Type 2 diabetes mellitus with other diabetic kidney complication: Secondary | ICD-10-CM | POA: Diagnosis not present

## 2020-09-09 DIAGNOSIS — I5042 Chronic combined systolic (congestive) and diastolic (congestive) heart failure: Secondary | ICD-10-CM | POA: Diagnosis not present

## 2020-09-09 DIAGNOSIS — I129 Hypertensive chronic kidney disease with stage 1 through stage 4 chronic kidney disease, or unspecified chronic kidney disease: Secondary | ICD-10-CM | POA: Diagnosis not present

## 2020-09-09 DIAGNOSIS — D631 Anemia in chronic kidney disease: Secondary | ICD-10-CM | POA: Diagnosis not present

## 2020-09-09 DIAGNOSIS — E1122 Type 2 diabetes mellitus with diabetic chronic kidney disease: Secondary | ICD-10-CM | POA: Diagnosis not present

## 2020-09-10 DIAGNOSIS — R69 Illness, unspecified: Secondary | ICD-10-CM | POA: Diagnosis not present

## 2020-09-16 ENCOUNTER — Other Ambulatory Visit: Payer: Self-pay | Admitting: Cardiology

## 2020-09-16 MED ORDER — CARVEDILOL 12.5 MG PO TABS: 12.5000 mg | ORAL_TABLET | Freq: Two times a day (BID) | ORAL | 3 refills | Status: AC

## 2020-09-16 NOTE — Telephone Encounter (Signed)
Refilled coreg 12.5 mg bid and clonidine 0.1 mg TID

## 2020-09-19 ENCOUNTER — Other Ambulatory Visit: Payer: Self-pay | Admitting: Nephrology

## 2020-09-19 ENCOUNTER — Other Ambulatory Visit (HOSPITAL_COMMUNITY): Payer: Self-pay | Admitting: Nephrology

## 2020-09-19 ENCOUNTER — Telehealth: Payer: Self-pay | Admitting: Cardiology

## 2020-09-19 DIAGNOSIS — R809 Proteinuria, unspecified: Secondary | ICD-10-CM | POA: Diagnosis not present

## 2020-09-19 DIAGNOSIS — N189 Chronic kidney disease, unspecified: Secondary | ICD-10-CM

## 2020-09-19 DIAGNOSIS — E1129 Type 2 diabetes mellitus with other diabetic kidney complication: Secondary | ICD-10-CM | POA: Diagnosis not present

## 2020-09-19 DIAGNOSIS — N170 Acute kidney failure with tubular necrosis: Secondary | ICD-10-CM

## 2020-09-19 DIAGNOSIS — E1122 Type 2 diabetes mellitus with diabetic chronic kidney disease: Secondary | ICD-10-CM | POA: Diagnosis not present

## 2020-09-19 DIAGNOSIS — D631 Anemia in chronic kidney disease: Secondary | ICD-10-CM | POA: Diagnosis not present

## 2020-09-19 NOTE — Telephone Encounter (Signed)
Patient called stating that she saw Dr. Theador Hawthorne today and he wants to cut her Norvasc 10mg  to 5 mg daily.

## 2020-09-20 DIAGNOSIS — E1129 Type 2 diabetes mellitus with other diabetic kidney complication: Secondary | ICD-10-CM | POA: Diagnosis not present

## 2020-09-20 DIAGNOSIS — D631 Anemia in chronic kidney disease: Secondary | ICD-10-CM | POA: Diagnosis not present

## 2020-09-20 DIAGNOSIS — N170 Acute kidney failure with tubular necrosis: Secondary | ICD-10-CM | POA: Diagnosis not present

## 2020-09-20 DIAGNOSIS — R809 Proteinuria, unspecified: Secondary | ICD-10-CM | POA: Diagnosis not present

## 2020-09-20 DIAGNOSIS — E1122 Type 2 diabetes mellitus with diabetic chronic kidney disease: Secondary | ICD-10-CM | POA: Diagnosis not present

## 2020-09-20 DIAGNOSIS — N189 Chronic kidney disease, unspecified: Secondary | ICD-10-CM | POA: Diagnosis not present

## 2020-09-20 NOTE — Telephone Encounter (Signed)
Spoke with patient and she verbalized understanding about new medication orders from Dr. Theador Hawthorne. He has reduced her Amlodipine 10 mg tablet QD to 5 mg tablet QD. She also verbalized understanding about the need to keep a check on her B/P and to give [place] a call for any other concerns or questions she may have.

## 2020-09-20 NOTE — Telephone Encounter (Signed)
New message     Patient 2nd call about medication instructions , please call

## 2020-09-23 ENCOUNTER — Encounter (INDEPENDENT_AMBULATORY_CARE_PROVIDER_SITE_OTHER): Payer: Medicare HMO | Admitting: Ophthalmology

## 2020-09-23 ENCOUNTER — Other Ambulatory Visit: Payer: Self-pay

## 2020-09-23 DIAGNOSIS — H35341 Macular cyst, hole, or pseudohole, right eye: Secondary | ICD-10-CM | POA: Diagnosis not present

## 2020-09-23 DIAGNOSIS — H43813 Vitreous degeneration, bilateral: Secondary | ICD-10-CM

## 2020-09-23 DIAGNOSIS — H35033 Hypertensive retinopathy, bilateral: Secondary | ICD-10-CM | POA: Diagnosis not present

## 2020-09-23 DIAGNOSIS — I10 Essential (primary) hypertension: Secondary | ICD-10-CM

## 2020-09-23 DIAGNOSIS — E113593 Type 2 diabetes mellitus with proliferative diabetic retinopathy without macular edema, bilateral: Secondary | ICD-10-CM

## 2020-09-25 DIAGNOSIS — Z794 Long term (current) use of insulin: Secondary | ICD-10-CM | POA: Diagnosis not present

## 2020-09-25 DIAGNOSIS — E785 Hyperlipidemia, unspecified: Secondary | ICD-10-CM | POA: Diagnosis not present

## 2020-09-25 DIAGNOSIS — N185 Chronic kidney disease, stage 5: Secondary | ICD-10-CM | POA: Diagnosis not present

## 2020-09-25 DIAGNOSIS — I129 Hypertensive chronic kidney disease with stage 1 through stage 4 chronic kidney disease, or unspecified chronic kidney disease: Secondary | ICD-10-CM | POA: Diagnosis not present

## 2020-09-25 DIAGNOSIS — R69 Illness, unspecified: Secondary | ICD-10-CM | POA: Diagnosis not present

## 2020-09-25 DIAGNOSIS — Z89512 Acquired absence of left leg below knee: Secondary | ICD-10-CM | POA: Diagnosis not present

## 2020-09-25 DIAGNOSIS — E119 Type 2 diabetes mellitus without complications: Secondary | ICD-10-CM | POA: Diagnosis not present

## 2020-09-25 DIAGNOSIS — I509 Heart failure, unspecified: Secondary | ICD-10-CM | POA: Diagnosis not present

## 2020-09-25 DIAGNOSIS — Z79899 Other long term (current) drug therapy: Secondary | ICD-10-CM | POA: Diagnosis not present

## 2020-09-25 DIAGNOSIS — Z9714 Presence of artificial left leg (complete) (partial): Secondary | ICD-10-CM | POA: Diagnosis not present

## 2020-09-27 DIAGNOSIS — R69 Illness, unspecified: Secondary | ICD-10-CM | POA: Diagnosis not present

## 2020-10-01 ENCOUNTER — Other Ambulatory Visit: Payer: Self-pay

## 2020-10-01 ENCOUNTER — Ambulatory Visit (HOSPITAL_COMMUNITY)
Admission: RE | Admit: 2020-10-01 | Discharge: 2020-10-01 | Disposition: A | Discharge status: Home / Self Care | Payer: Medicare HMO | Source: Ambulatory Visit | Attending: Nephrology | Admitting: Nephrology

## 2020-10-01 DIAGNOSIS — N179 Acute kidney failure, unspecified: Secondary | ICD-10-CM | POA: Diagnosis not present

## 2020-10-01 DIAGNOSIS — N202 Calculus of kidney with calculus of ureter: Secondary | ICD-10-CM | POA: Diagnosis not present

## 2020-10-01 DIAGNOSIS — R809 Proteinuria, unspecified: Secondary | ICD-10-CM | POA: Diagnosis not present

## 2020-10-01 DIAGNOSIS — N189 Chronic kidney disease, unspecified: Secondary | ICD-10-CM

## 2020-10-01 DIAGNOSIS — N170 Acute kidney failure with tubular necrosis: Secondary | ICD-10-CM | POA: Insufficient documentation

## 2020-10-17 DIAGNOSIS — E1122 Type 2 diabetes mellitus with diabetic chronic kidney disease: Secondary | ICD-10-CM | POA: Diagnosis not present

## 2020-10-17 DIAGNOSIS — N189 Chronic kidney disease, unspecified: Secondary | ICD-10-CM | POA: Diagnosis not present

## 2020-10-17 DIAGNOSIS — D631 Anemia in chronic kidney disease: Secondary | ICD-10-CM | POA: Diagnosis not present

## 2020-10-17 DIAGNOSIS — R809 Proteinuria, unspecified: Secondary | ICD-10-CM | POA: Diagnosis not present

## 2020-10-17 DIAGNOSIS — I5042 Chronic combined systolic (congestive) and diastolic (congestive) heart failure: Secondary | ICD-10-CM | POA: Diagnosis not present

## 2020-10-17 DIAGNOSIS — I129 Hypertensive chronic kidney disease with stage 1 through stage 4 chronic kidney disease, or unspecified chronic kidney disease: Secondary | ICD-10-CM | POA: Diagnosis not present

## 2020-10-17 DIAGNOSIS — E1129 Type 2 diabetes mellitus with other diabetic kidney complication: Secondary | ICD-10-CM | POA: Diagnosis not present

## 2020-10-23 ENCOUNTER — Telehealth (HOSPITAL_COMMUNITY): Payer: Self-pay | Admitting: Vascular Surgery

## 2020-10-23 ENCOUNTER — Telehealth: Payer: Self-pay | Admitting: Cardiology

## 2020-10-23 NOTE — Telephone Encounter (Signed)
I spoke with patient and she no longer wants to be seen in heart failure clinic.Apt cancelled, I message L.Short, RN.

## 2020-10-23 NOTE — Telephone Encounter (Signed)
Left pt VM giving new chf  appt date and time ,asked pt o call back to confirm appt , new pt info will be sent in mail

## 2020-10-23 NOTE — Telephone Encounter (Signed)
New message     Please call [name]  She wants to know why she was referred to the CHF clinic? What exactly do you think they can do for her that Dr [name] can not?

## 2020-11-04 DIAGNOSIS — E11622 Type 2 diabetes mellitus with other skin ulcer: Secondary | ICD-10-CM | POA: Diagnosis not present

## 2020-11-04 DIAGNOSIS — E1169 Type 2 diabetes mellitus with other specified complication: Secondary | ICD-10-CM | POA: Diagnosis not present

## 2020-11-07 ENCOUNTER — Telehealth (HOSPITAL_COMMUNITY): Payer: Self-pay | Admitting: Vascular Surgery

## 2020-11-07 NOTE — Telephone Encounter (Signed)
Left pt message to make new pt appt 

## 2020-11-11 DIAGNOSIS — E11622 Type 2 diabetes mellitus with other skin ulcer: Secondary | ICD-10-CM | POA: Diagnosis not present

## 2020-11-17 ENCOUNTER — Encounter (HOSPITAL_COMMUNITY): Payer: Self-pay | Admitting: Emergency Medicine

## 2020-11-17 ENCOUNTER — Other Ambulatory Visit: Payer: Self-pay

## 2020-11-17 ENCOUNTER — Emergency Department (HOSPITAL_COMMUNITY)
Admission: EM | Admit: 2020-11-17 | Discharge: 2020-11-17 | Disposition: A | Discharge status: Home / Self Care | Payer: Medicare HMO | Attending: Emergency Medicine | Admitting: Emergency Medicine

## 2020-11-17 ENCOUNTER — Emergency Department (HOSPITAL_COMMUNITY): Payer: Medicare HMO

## 2020-11-17 DIAGNOSIS — Z89512 Acquired absence of left leg below knee: Secondary | ICD-10-CM | POA: Insufficient documentation

## 2020-11-17 DIAGNOSIS — E1022 Type 1 diabetes mellitus with diabetic chronic kidney disease: Secondary | ICD-10-CM | POA: Diagnosis not present

## 2020-11-17 DIAGNOSIS — E10319 Type 1 diabetes mellitus with unspecified diabetic retinopathy without macular edema: Secondary | ICD-10-CM | POA: Insufficient documentation

## 2020-11-17 DIAGNOSIS — R600 Localized edema: Secondary | ICD-10-CM | POA: Diagnosis not present

## 2020-11-17 DIAGNOSIS — B49 Unspecified mycosis: Secondary | ICD-10-CM | POA: Insufficient documentation

## 2020-11-17 DIAGNOSIS — E1069 Type 1 diabetes mellitus with other specified complication: Secondary | ICD-10-CM

## 2020-11-17 DIAGNOSIS — I739 Peripheral vascular disease, unspecified: Secondary | ICD-10-CM | POA: Diagnosis not present

## 2020-11-17 DIAGNOSIS — Z7982 Long term (current) use of aspirin: Secondary | ICD-10-CM | POA: Diagnosis not present

## 2020-11-17 DIAGNOSIS — L97921 Non-pressure chronic ulcer of unspecified part of left lower leg limited to breakdown of skin: Secondary | ICD-10-CM | POA: Diagnosis not present

## 2020-11-17 DIAGNOSIS — Z79899 Other long term (current) drug therapy: Secondary | ICD-10-CM | POA: Diagnosis not present

## 2020-11-17 DIAGNOSIS — Z794 Long term (current) use of insulin: Secondary | ICD-10-CM | POA: Insufficient documentation

## 2020-11-17 DIAGNOSIS — L97911 Non-pressure chronic ulcer of unspecified part of right lower leg limited to breakdown of skin: Secondary | ICD-10-CM | POA: Insufficient documentation

## 2020-11-17 DIAGNOSIS — M7989 Other specified soft tissue disorders: Secondary | ICD-10-CM | POA: Diagnosis present

## 2020-11-17 DIAGNOSIS — I5022 Chronic systolic (congestive) heart failure: Secondary | ICD-10-CM | POA: Diagnosis not present

## 2020-11-17 DIAGNOSIS — R609 Edema, unspecified: Secondary | ICD-10-CM

## 2020-11-17 DIAGNOSIS — I130 Hypertensive heart and chronic kidney disease with heart failure and stage 1 through stage 4 chronic kidney disease, or unspecified chronic kidney disease: Secondary | ICD-10-CM | POA: Diagnosis not present

## 2020-11-17 DIAGNOSIS — N183 Chronic kidney disease, stage 3 unspecified: Secondary | ICD-10-CM | POA: Insufficient documentation

## 2020-11-17 DIAGNOSIS — S81801A Unspecified open wound, right lower leg, initial encounter: Secondary | ICD-10-CM | POA: Diagnosis not present

## 2020-11-17 MED ORDER — AMOXICILLIN-POT CLAVULANATE 875-125 MG PO TABS
1.0000 | ORAL_TABLET | Freq: Once | ORAL | Status: AC
  Administered 2020-11-17: 1 via ORAL
  Filled 2020-11-17: qty 1

## 2020-11-17 MED ORDER — CLOTRIMAZOLE 1 % EX CREA: TOPICAL_CREAM | CUTANEOUS | 0 refills | Status: AC

## 2020-11-17 MED ORDER — AMOXICILLIN-POT CLAVULANATE 875-125 MG PO TABS: 1.0000 | ORAL_TABLET | Freq: Two times a day (BID) | ORAL | 0 refills | Status: AC

## 2020-11-17 NOTE — ED Notes (Signed)
Right leg cleansed and wrapped with no stick dressing and dressed lightly with Curlex.  Pt had good pulses and motor sensation in her leg.  Pt educated on cleansing and dressing leg.  Pt verbalized understanding of care.

## 2020-11-17 NOTE — ED Triage Notes (Signed)
Pt c/o of right leg infection since 1/31. Pt states her leg is draining.

## 2020-11-17 NOTE — Discharge Instructions (Addendum)
We are changing your antibiotics to Augmentin to help with the wounds of the right leg heal.  Clean them well with soap and water at least once a day.  You can also try a warm compress on them to improve healing.  We are giving you a prescription for a fungal infection between the toes.  Apply the cream as directed.  When you are not ambulating try to keep your leg elevated above your heart, as much as possible.  Also try to limit salt in your diet to help limit swelling of your leg.

## 2020-11-17 NOTE — ED Provider Notes (Signed)
[facility] EMERGENCY DEPARTMENT Provider Note   CSN: [id number] Arrival date & time: 11/17/20  0744     History Chief Complaint  Patient presents with  . Leg Pain    [name] is a 75 y.o. female.  HPI Patient here for evaluation of draining wounds of her left lower leg, despite taking Septra.  She saw her PCP, 2 weeks ago when she was started on Septra.  She reports ongoing swelling of the right leg, for several weeks.  She has a left BKA.  She denies shortness of breath, fever, chills, cough, chest pain, weakness or dizziness.  There are no other known active modifying factors.    Past Medical History:  Diagnosis Date  . Anemia   . Anxiety   . Chronic systolic heart failure ([place])   . Coronary atherosclerosis of native coronary artery    Nonobstructive  . Depression   . Diabetes mellitus, type 2 ([place])   . Essential hypertension, benign   . MRSA bacteremia January 2015   Associated with probable tricuspid valve vegetation, ICD system extracted February 2015  . Nonischemic cardiomyopathy (HCC)    LVEF 20% up to 45%  . PAD (peripheral artery disease) (HCC)    Left foot gangrene    Patient Active Problem List   Diagnosis Date Noted  . Below knee amputation status 04/13/2014  . Diabetes mellitus with foot ulcer and gangrene ([place]) 03/23/2014  . Atherosclerosis of native arteries of the extremities with gangrene ([place]) 03/20/2014  . CKD (chronic kidney disease) stage 3, GFR 30-59 ml/min 12/18/2013  . Bacterial endocarditis 11/14/2013  . Type 2 diabetes mellitus ([place]) 11/08/2013  . Normocytic anemia 11/08/2013  . MRSA bacteremia 11/08/2013  . Proliferative diabetic retinopathy ([place]) 10/12/2012  . Atherosclerosis of native arteries of the extremities with intermittent claudication 03/22/2012  . HYPERLIPIDEMIA 12/31/2009  . CORONARY ATHEROSCLEROSIS NATIVE CORONARY ARTERY 12/31/2009  . Nonischemic cardiomyopathy ([place]) 12/31/2009  . Chronic systolic heart failure  ([place]) 12/31/2009  . Essential hypertension, benign 12/05/2009    Past Surgical History:  Procedure Laterality Date  . [place] VITRECTOMY WITH 20 GAUGE MVR PORT FOR MACULAR HOLE Right 11/10/2012   Procedure: 25 GAUGE PARS PLANA VITRECTOMY WITH 20 GAUGE MVR PORT FOR MACULAR HOLE;  Surgeon: Hayden Pedro, MD;  Location: [place];  Service: Ophthalmology;  Laterality: Right;  . 25 GAUGE PARS PLANA VITRECTOMY WITH 20 GAUGE MVR PORT FOR MACULAR HOLE Right 05/09/2013   Procedure: 25 GAUGE PARS PLANA VITRECTOMY WITH 20 GAUGE MVR PORT FOR MACULAR HOLE;  Surgeon: Hayden Pedro, MD;  Location: [place];  Service: Ophthalmology;  Laterality: Right;  . ABDOMINAL HYSTERECTOMY    . AMPUTATION Left 03/23/2014   Procedure: Left Foot 1st Ray Amputation;  Surgeon: Newt Minion, MD;  Location: [place];  Service: Orthopedics;  Laterality: Left;  Left Foot 1st Ray Amputation  . AMPUTATION Left 04/13/2014   Procedure: AMPUTATION BELOW KNEE;  Surgeon: Newt Minion, MD;  Location: [place];  Service: Orthopedics;  Laterality: Left;  Left Below Knee Amputation  . BLADDER SURGERY  2011 BENIGN MASS REMOVED FROM BLADDER  . BREAST LUMPECTOMY     Bil  . CARDIAC DEFIBRILLATOR PLACEMENT     Medtronic [id number]   . CENTRAL VENOUS CATHETER INSERTION Right 11/09/2013   Procedure: INSERTION CENTRAL LINE ADULT;  Surgeon: Evans Lance, MD;  Location: [place];  Service: Cardiovascular;  Laterality: Right;  . Exploratory laparotomy with lysis of adhesions    .  EYE SURGERY  BILATERAL CATARACTS REMOVAL  10 YEARS AGO PER PATIENT  . GAS INSERTION Right 11/10/2012   Procedure: INSERTION OF GAS;  Surgeon: Hayden Pedro, MD;  Location: [place];  Service: Ophthalmology;  Laterality: Right;  C3F8  . GAS/FLUID EXCHANGE Right 05/09/2013   Procedure: GAS/FLUID EXCHANGE;  Surgeon: Hayden Pedro, MD;  Location: [place];  Service: Ophthalmology;  Laterality: Right;  C3F8   . ICD LEAD REMOVAL N/A 11/09/2013   Procedure: ICD LEAD REMOVAL;  Surgeon:  Evans Lance, MD;  Location: [place];  Service: Cardiovascular;  Laterality: N/A;  . LASER PHOTO ABLATION Right 05/09/2013   Procedure: LASER PHOTO ABLATION;  Surgeon: Hayden Pedro, MD;  Location: [place];  Service: Ophthalmology;  Laterality: Right;  Endolaser   . Left shoulder surgery     Rotator Cuff  . MEMBRANE PEEL Right 05/09/2013   Procedure: MEMBRANE PEEL;  Surgeon: Hayden Pedro, MD;  Location: [place];  Service: Ophthalmology;  Laterality: Right;  . PR VEIN BYPASS GRAFT,AORTO-FEM-POP  05-2011   Left popliteal- PTA graft   . SERUM PATCH Right 11/10/2012   Procedure: SERUM PATCH;  Surgeon: Hayden Pedro, MD;  Location: [place];  Service: Ophthalmology;  Laterality: Right;  . SERUM PATCH Right 05/09/2013   Procedure: SERUM PATCH;  Surgeon: Hayden Pedro, MD;  Location: [place];  Service: Ophthalmology;  Laterality: Right;  . STUMP REVISION Left 05/30/2014   Procedure: Left Below Knee Amputation Revision;  Surgeon: Newt Minion, MD;  Location: [place];  Service: Orthopedics;  Laterality: Left;  . TOE AMPUTATION Left    great toe  . Viterectomy  11/10/2012   OD   Dr Zigmund Daniel     OB History   No obstetric history on file.     Family History  Problem Relation Age of Onset  . Cirrhosis Father   . Cancer Mother        Ovarian  . Ovarian cancer Mother   . Hypertension Mother     Social History   Tobacco Use  . Smoking status: Never Smoker  . Smokeless tobacco: Never Used  . Tobacco comment: tobacco use-no  Vaping Use  . Vaping Use: Never used  Substance Use Topics  . Alcohol use: No    Alcohol/week: 0.0 standard drinks  . Drug use: No    Home Medications Prior to Admission medications   Medication Sig Start Date End Date Taking? Authorizing Provider  amoxicillin-clavulanate (AUGMENTIN) 875-125 MG tablet Take 1 tablet by mouth 2 (two) times daily. One po bid x 7 days 11/17/20  Yes Daleen Bo, MD  clotrimazole (LOTRIMIN) 1 % cream Apply to affected area, between toes, 2  times daily 11/17/20  Yes Daleen Bo, MD  ALPRAZolam Duanne Moron) 0.5 MG tablet Take one tablet by mouth twice daily as needed for anxiety    [provider]  amLODipine (NORVASC) 10 MG tablet TAKE 1 TABLET BY MOUTH EVERY DAY 01/15/20   Satira Sark, MD  aspirin 81 MG tablet Take 81 mg by mouth daily.     [provider]  calcitRIOL (ROCALTROL) 0.25 MCG capsule Take 0.25 mcg by mouth every other day.     [provider]  carvedilol (COREG) 12.5 MG tablet Take 1 tablet (12.5 mg total) by mouth 2 (two) times daily. 09/16/20 12/15/20  Satira Sark, MD  Cholecalciferol (VITAMIN D-3) 1000 UNITS CAPS Take 1,000 Units by mouth every other day.     [provider]  cloNIDine (CATAPRES) 0.1 MG tablet TAKE 1 TABLET BY MOUTH 3 TIMES DAILY. 09/16/20   Satira Sark, MD  hydrALAZINE (APRESOLINE) 100 MG tablet TAKE 1 TABLET BY MOUTH THREE TIMES A DAY 04/30/20   Satira Sark, MD  Insulin Glargine [facility] KWIKPEN) 100 UNIT/ML SOPN Inject 30 Units into the skin at bedtime.    [provider]  insulin lispro (HUMALOG) 100 UNIT/ML injection Inject 0-8 Units into the skin every evening. Sliding scale: 200-249=2 units, 250-299=4 units, 300-349=6 units, >350=8 units    [provider]  isosorbide mononitrate (IMDUR) 30 MG 24 hr tablet TAKE 1 TABLET BY MOUTH TWICE A DAY 08/26/20   Satira Sark, MD  loratadine (CLARITIN) 10 MG tablet Take 10 mg by mouth daily as needed. 07/18/18   [provider]  nitroGLYCERIN (NITROSTAT) 0.4 MG SL tablet Place 1 tablet (0.4 mg total) under the tongue every 5 (five) minutes as needed for chest pain. Patient not taking: Reported on 06/28/2020 11/27/15   Satira Sark, MD  polyethylene glycol [facility] / Floria Raveling) packet Take 17 g by mouth every other day.     [provider]  rosuvastatin (CRESTOR) 10 MG tablet Take 1 tablet by mouth daily. 11/12/18   [provider]  sodium  bicarbonate 650 MG tablet Take 1,300 mg by mouth 2 (two) times daily.     [provider]  torsemide (DEMADEX) 20 MG tablet TAKE 3 TABLETS BY MOUTH TWICE A DAY 05/29/20   Satira Sark, MD    Allergies    Hydrocodone-acetaminophen  Review of Systems   Review of Systems  All other systems reviewed and are negative.   Physical Exam Updated Vital Signs BP 117/64   Pulse (!) 58   Resp 18   Ht '5\' 1"'$  (1.549 m)   Wt 77.6 kg   SpO2 93%   BMI 32.31 kg/m   Physical Exam Vitals and nursing note reviewed.  Constitutional:      General: She is not in acute distress.    Appearance: She is well-developed and well-nourished. She is obese. She is not ill-appearing, toxic-appearing or diaphoretic.  HENT:     Head: Normocephalic and atraumatic.     Right Ear: External ear normal.     Left Ear: External ear normal.  Eyes:     Extraocular Movements: EOM normal.     Conjunctiva/sclera: Conjunctivae normal.     Pupils: Pupils are equal, round, and reactive to light.  Neck:     Trachea: Phonation normal.  Cardiovascular:     Rate and Rhythm: Normal rate.  Pulmonary:     Effort: Pulmonary effort is normal.     Breath sounds: Normal breath sounds.  Chest:     Chest wall: No bony tenderness.  Abdominal:     General: There is no distension.  Musculoskeletal:        General: Swelling (Left upper and lower leg, moderate) present. Normal range of motion.     Cervical back: Normal range of motion and neck supple.     Comments: Left leg BKA  Skin:    General: Skin is warm, dry and intact.     Comments: Posterior lower calf with several areas of superficial breakdown/ulcer, with granulation tissue but no associated fluctuance, drainage or bleeding.  Mild discoloration of the skin of the foot.  Skin appears dark in color.  She has mild fungal disease evidenced in the webspaces of the right foot by moistness and  white exudate.  There are no significant skin breaks.  Unable to assess  left lower leg and foot because they are absent.  Neurological:     Mental Status: She is alert and oriented to person, place, and time.     Cranial Nerves: No cranial nerve deficit.     Sensory: No sensory deficit.     Motor: No abnormal muscle tone.     Coordination: Coordination normal.  Psychiatric:        Mood and Affect: Mood and affect and mood normal.        Behavior: Behavior normal.        Thought Content: Thought content normal.        Judgment: Judgment normal.     ED Results / Procedures / Treatments   Labs (all labs ordered are listed, but only abnormal results are displayed) Labs Reviewed - No data to display  EKG None  Radiology US Venous Img Lower Unilateral Right (DVT)  Result Date: 11/17/2020 CLINICAL DATA:  Pain, open wound right lower extremity EXAM: RIGHT LOWER EXTREMITY VENOUS DOPPLER ULTRASOUND TECHNIQUE: Gray-scale sonography with graded compression, as well as color Doppler and duplex ultrasound were performed to evaluate the lower extremity deep venous systems from the level of the common femoral vein and including the common femoral, femoral, profunda femoral, popliteal and calf veins including the posterior tibial, peroneal and gastrocnemius veins when visible. The superficial great saphenous vein was also interrogated. Spectral Doppler was utilized to evaluate flow at rest and with distal augmentation maneuvers in the common femoral, femoral and popliteal veins. COMPARISON:  None. FINDINGS: Contralateral Common Femoral Vein: Respiratory phasicity is normal and symmetric with the symptomatic side. No evidence of thrombus. Normal compressibility. Common Femoral Vein: No evidence of thrombus. Normal compressibility, respiratory phasicity and response to augmentation. Saphenofemoral Junction: No evidence of thrombus. Normal compressibility and flow on color Doppler imaging. Profunda Femoral Vein: No evidence of thrombus. Normal compressibility and flow on color  Doppler imaging. Femoral Vein: No evidence of thrombus. Normal compressibility, respiratory phasicity and response to augmentation. Popliteal Vein: No evidence of thrombus. Normal compressibility, respiratory phasicity and response to augmentation. Calf Veins: Limited assessment of the calf veins because of body habitus and peripheral edema. Posterior tibial vein appears patent. Peroneal vein not visualized. IMPRESSION: No evidence of significant right lower extremity DVT. Limited assessment of the calf veins. Peripheral subcutaneous calf edema. Electronically Signed   By: Jerilynn Mages.  Shick M.D.   On: 11/17/2020 09:21    Procedures Procedures   Medications Ordered in ED Medications  amoxicillin-clavulanate (AUGMENTIN) 875-125 MG per tablet 1 tablet (1 tablet Oral Given 11/17/20 0915)    ED Course  I have reviewed the triage vital signs and the nursing notes.  Pertinent labs & imaging results that were available during my care of the patient were reviewed by me and considered in my medical decision making (see chart for details).  Clinical Course as of 11/17/20 1006  Sun Nov 17, 2020  0953 Unable to palpate pulse in right foot.  Using Doppler interrogation she has a monophasic dorsalis pedis pulse and a biphasic posterior tibial pulse. [EW]    Clinical Course User Index [EW] Daleen Bo, MD   MDM Rules/Calculators/A&P                           Patient Vitals for the past 24 hrs:  BP Pulse Resp SpO2 Height Weight  11/17/20 0945 -- (!) 58 --  93 % -- --  11/17/20 0930 117/64 -- -- -- -- --  11/17/20 0916 (!) 144/72 60 18 100 % -- --  11/17/20 0808 -- -- -- -- '5\' 1"'$  (1.549 m) 77.6 kg    9:53 AM Reevaluation with update and discussion. After initial assessment and treatment, an updated evaluation reveals no change in status.  She states she has previously seen vascular surgery, around the time that she had a leg infection which required the amputation.  She states that she had poor blood flow  in the left leg at that time, and somewhat diminished blood flow in the right leg.  Findings discussed and questions answered. Daleen Bo   Medical Decision Making:  This patient is presenting for evaluation of poorly healing wounds of the right lower leg, which does require a range of treatment options, and is a complaint that involves a moderate risk of morbidity and mortality. The differential diagnoses include DVT, arterial insufficiency, cellulitis, chronic wounds. I decided to review old records, and in summary elderly female, with prior history of peripheral vascular disease, presenting for poorly healing wounds and a swollen leg.  I did not require additional historical information from anyone.    Critical Interventions-clinical evaluation, imaging, bedside Doppler right leg by me, observation and reassess  After These Interventions, the Patient was reevaluated and was found stable for discharge.  Patient with poorly healing wounds, likely multifactorial including diabetes, mild arterial insufficiency, peripheral edema, chronic illness and decreased mobility.  Doubt cellulitis, acute ischemia, venous thrombus, or deep tissue infection.  Patient will benefit from changing course of treatment and seeking assistance with consultation by vascular surgery.  She has not required hospitalization or other ED intervention at this time.  Will give prescriptions for Augmentin for wounds, Lotrimin for fungal disease, and recommend follow-up with vascular surgery as well as PCP.  CRITICAL CARE- no Performed by: Daleen Bo  Nursing Notes Reviewed/ Care Coordinated Applicable Imaging Reviewed Interpretation of Laboratory Data incorporated into ED treatment  The patient appears reasonably screened and/or stabilized for discharge and I doubt any other medical condition or other [facility] requiring further screening, evaluation, or treatment in the ED at this time prior to discharge.  Plan: Home  Medications-continue usual except Septra, start Augmentin and Lotrimin; Home Treatments-elevate right leg is much as possible.  Low-salt diet.  She may need compression stockings down the road.; return here if the recommended treatment, does not improve the symptoms; Recommended follow up-vascular surgery soon as possible for assessment of arterial disease and venous insufficiency.  PCP for ongoing management and general care.     Final Clinical Impression(s) / ED Diagnoses Final diagnoses:  Ulcers of both lower legs, limited to breakdown of skin ([place])  Peripheral arterial disease ([place])  Peripheral edema  Type 1 diabetes mellitus with other specified complication ([place])  Fungal disease    Rx / DC Orders ED Discharge Orders         Ordered    clotrimazole (LOTRIMIN) 1 % cream        11/17/20 1006    amoxicillin-clavulanate (AUGMENTIN) 875-125 MG tablet  2 times daily        11/17/20 1006           Daleen Bo, MD 11/17/20 1007

## 2020-11-26 DIAGNOSIS — I7025 Atherosclerosis of native arteries of other extremities with ulceration: Secondary | ICD-10-CM | POA: Diagnosis not present

## 2020-11-26 DIAGNOSIS — E11622 Type 2 diabetes mellitus with other skin ulcer: Secondary | ICD-10-CM | POA: Diagnosis not present

## 2020-11-26 DIAGNOSIS — R600 Localized edema: Secondary | ICD-10-CM | POA: Diagnosis not present

## 2020-12-03 DEATH — deceased

## 2020-12-11 ENCOUNTER — Encounter (HOSPITAL_COMMUNITY): Payer: Medicare HMO | Admitting: Cardiology

## 2021-03-24 ENCOUNTER — Encounter (INDEPENDENT_AMBULATORY_CARE_PROVIDER_SITE_OTHER): Payer: Medicare HMO | Admitting: Ophthalmology
